# Patient Record
Sex: Female | Born: 1935 | Race: White | Hispanic: No | Smoking: Former smoker
Health system: Southern US, Community
[De-identification: ages and names within clinical notes are randomized; demographics above are authoritative.]

## PROBLEM LIST (undated history)

## (undated) DIAGNOSIS — R7303 Prediabetes: Secondary | ICD-10-CM

## (undated) DIAGNOSIS — K648 Other hemorrhoids: Secondary | ICD-10-CM

## (undated) DIAGNOSIS — E785 Hyperlipidemia, unspecified: Secondary | ICD-10-CM

## (undated) DIAGNOSIS — F419 Anxiety disorder, unspecified: Secondary | ICD-10-CM

## (undated) DIAGNOSIS — K5730 Diverticulosis of large intestine without perforation or abscess without bleeding: Secondary | ICD-10-CM

## (undated) DIAGNOSIS — M1990 Unspecified osteoarthritis, unspecified site: Secondary | ICD-10-CM

## (undated) DIAGNOSIS — I447 Left bundle-branch block, unspecified: Secondary | ICD-10-CM

## (undated) DIAGNOSIS — K760 Fatty (change of) liver, not elsewhere classified: Secondary | ICD-10-CM

## (undated) DIAGNOSIS — I5032 Chronic diastolic (congestive) heart failure: Secondary | ICD-10-CM

## (undated) DIAGNOSIS — J40 Bronchitis, not specified as acute or chronic: Secondary | ICD-10-CM

## (undated) DIAGNOSIS — R0789 Other chest pain: Secondary | ICD-10-CM

## (undated) DIAGNOSIS — M8580 Other specified disorders of bone density and structure, unspecified site: Secondary | ICD-10-CM

## (undated) DIAGNOSIS — I2510 Atherosclerotic heart disease of native coronary artery without angina pectoris: Secondary | ICD-10-CM

## (undated) DIAGNOSIS — I10 Essential (primary) hypertension: Secondary | ICD-10-CM

## (undated) HISTORY — DX: Atherosclerotic heart disease of native coronary artery without angina pectoris: I25.10

## (undated) HISTORY — DX: Fatty (change of) liver, not elsewhere classified: K76.0

## (undated) HISTORY — DX: Left bundle-branch block, unspecified: I44.7

## (undated) HISTORY — DX: Other chest pain: R07.89

## (undated) HISTORY — DX: Essential (primary) hypertension: I10

## (undated) HISTORY — PX: BREAST SURGERY: SHX581

## (undated) HISTORY — DX: Hyperlipidemia, unspecified: E78.5

## (undated) HISTORY — DX: Bronchitis, not specified as acute or chronic: J40

## (undated) HISTORY — PX: TONSILLECTOMY: SUR1361

## (undated) HISTORY — PX: COLONOSCOPY: SHX174

## (undated) HISTORY — DX: Unspecified osteoarthritis, unspecified site: M19.90

## (undated) HISTORY — PX: ABDOMINAL HYSTERECTOMY: SHX81

## (undated) HISTORY — DX: Other hemorrhoids: K64.8

## (undated) HISTORY — DX: Anxiety disorder, unspecified: F41.9

## (undated) HISTORY — DX: Other specified disorders of bone density and structure, unspecified site: M85.80

## (undated) HISTORY — DX: Prediabetes: R73.03

## (undated) HISTORY — DX: Diverticulosis of large intestine without perforation or abscess without bleeding: K57.30

---

## 1997-06-25 ENCOUNTER — Other Ambulatory Visit: Admission: RE | Admit: 1997-06-25 | Discharge: 1997-06-25 | Payer: Self-pay | Admitting: *Deleted

## 1999-01-12 ENCOUNTER — Encounter: Payer: Self-pay | Admitting: *Deleted

## 1999-01-12 ENCOUNTER — Encounter: Admission: RE | Admit: 1999-01-12 | Discharge: 1999-01-12 | Payer: Self-pay | Admitting: *Deleted

## 1999-09-20 ENCOUNTER — Ambulatory Visit (HOSPITAL_COMMUNITY): Admission: RE | Admit: 1999-09-20 | Discharge: 1999-09-20 | Payer: Self-pay | Admitting: *Deleted

## 2000-01-31 ENCOUNTER — Encounter: Payer: Self-pay | Admitting: Internal Medicine

## 2000-01-31 ENCOUNTER — Encounter: Admission: RE | Admit: 2000-01-31 | Discharge: 2000-01-31 | Payer: Self-pay | Admitting: Internal Medicine

## 2000-06-27 ENCOUNTER — Ambulatory Visit (HOSPITAL_COMMUNITY): Admission: RE | Admit: 2000-06-27 | Discharge: 2000-06-27 | Payer: Self-pay | Admitting: Neurosurgery

## 2001-01-31 ENCOUNTER — Encounter: Payer: Self-pay | Admitting: Internal Medicine

## 2001-01-31 ENCOUNTER — Encounter: Admission: RE | Admit: 2001-01-31 | Discharge: 2001-01-31 | Payer: Self-pay | Admitting: Internal Medicine

## 2001-05-16 ENCOUNTER — Ambulatory Visit (HOSPITAL_COMMUNITY): Admission: RE | Admit: 2001-05-16 | Discharge: 2001-05-16 | Payer: Self-pay | Admitting: Internal Medicine

## 2001-05-16 ENCOUNTER — Encounter: Payer: Self-pay | Admitting: Internal Medicine

## 2002-02-13 ENCOUNTER — Encounter: Admission: RE | Admit: 2002-02-13 | Discharge: 2002-02-13 | Payer: Self-pay | Admitting: Internal Medicine

## 2002-02-13 ENCOUNTER — Encounter: Payer: Self-pay | Admitting: Internal Medicine

## 2003-06-18 ENCOUNTER — Encounter: Admission: RE | Admit: 2003-06-18 | Discharge: 2003-06-18 | Payer: Self-pay | Admitting: Internal Medicine

## 2004-08-04 ENCOUNTER — Encounter: Admission: RE | Admit: 2004-08-04 | Discharge: 2004-08-04 | Payer: Self-pay | Admitting: Family Medicine

## 2005-08-20 ENCOUNTER — Encounter: Admission: RE | Admit: 2005-08-20 | Discharge: 2005-08-20 | Payer: Self-pay | Admitting: Family Medicine

## 2006-07-01 ENCOUNTER — Ambulatory Visit: Payer: Self-pay | Admitting: Internal Medicine

## 2006-10-11 ENCOUNTER — Encounter: Admission: RE | Admit: 2006-10-11 | Discharge: 2006-10-11 | Payer: Self-pay | Admitting: Family Medicine

## 2007-04-26 DIAGNOSIS — I10 Essential (primary) hypertension: Secondary | ICD-10-CM | POA: Insufficient documentation

## 2007-10-21 ENCOUNTER — Encounter: Admission: RE | Admit: 2007-10-21 | Discharge: 2007-10-21 | Payer: Self-pay | Admitting: Family Medicine

## 2007-12-03 DIAGNOSIS — K648 Other hemorrhoids: Secondary | ICD-10-CM | POA: Insufficient documentation

## 2007-12-03 DIAGNOSIS — K5730 Diverticulosis of large intestine without perforation or abscess without bleeding: Secondary | ICD-10-CM | POA: Insufficient documentation

## 2008-01-19 ENCOUNTER — Ambulatory Visit: Payer: Self-pay | Admitting: Internal Medicine

## 2008-03-31 ENCOUNTER — Encounter (INDEPENDENT_AMBULATORY_CARE_PROVIDER_SITE_OTHER): Payer: Self-pay | Admitting: *Deleted

## 2008-05-21 ENCOUNTER — Encounter: Payer: Self-pay | Admitting: Internal Medicine

## 2008-06-22 ENCOUNTER — Ambulatory Visit: Payer: Self-pay | Admitting: Internal Medicine

## 2008-06-30 ENCOUNTER — Inpatient Hospital Stay (HOSPITAL_COMMUNITY): Admission: EM | Admit: 2008-06-30 | Discharge: 2008-07-01 | Payer: Self-pay | Admitting: Emergency Medicine

## 2008-06-30 ENCOUNTER — Telehealth: Payer: Self-pay | Admitting: Internal Medicine

## 2008-07-16 ENCOUNTER — Encounter: Admission: RE | Admit: 2008-07-16 | Discharge: 2008-07-16 | Payer: Self-pay | Admitting: Family Medicine

## 2009-01-13 ENCOUNTER — Telehealth: Payer: Self-pay | Admitting: Internal Medicine

## 2009-03-09 ENCOUNTER — Ambulatory Visit: Payer: Self-pay | Admitting: Family Medicine

## 2010-01-22 ENCOUNTER — Encounter: Payer: Self-pay | Admitting: Family Medicine

## 2010-01-31 NOTE — Progress Notes (Signed)
Summary: Schedule Hospital Colonoscopy?  Phone Note Outgoing Call   Call placed by: Hortense Ramal CMA Duncan Dull),  January 13, 2009 4:02 PM Call placed to: Patient Summary of Call: Patient was originally scheduled to have a hospital colonoscopy with banding on 06/30/08. However, per hospital d/c summary, patient did not have the colonoscopy as scheduled due to symptoms of weakness and tingling. She was found to be dehydrated and have pneumonia at that time. She had the miralax prep before having the symptoms of weakness etc..Marland KitchenMarland KitchenBefore I schedule her for another colonoscopy, I wanted to get your thoughts on what I should do. Do I need to just give her a different prep, does she need 24 hour observation for prep in hospital or do you think all her symptoms were related to the pneumonia?  Initial call taken by: Hortense Ramal CMA Duncan Dull),  January 13, 2009 4:07 PM  Follow-up for Phone Call        She should not have a direct colon, since it has been 8 months, and 1 year since I have seen her. She needs to make a new appointment in the office. Follow-up by: Hart Carwin MD,  January 14, 2009 12:26 AM  Additional Follow-up for Phone Call Additional follow up Details #1::        Patient has scheduled an office appointment to discuss colonoscopy with Dr Juanda Chance on 03/04/09. Additional Follow-up by: Hortense Ramal CMA Duncan Dull),  January 18, 2009 2:15 PM

## 2010-04-09 LAB — BASIC METABOLIC PANEL
CO2: 25 mEq/L (ref 19–32)
Calcium: 9.1 mg/dL (ref 8.4–10.5)
Chloride: 107 mEq/L (ref 96–112)
GFR calc Af Amer: >60 mL/min (ref >60)

## 2010-04-09 LAB — CBC
MCHC: 34.6 g/dL (ref 30.0–36.0)
MCV: 88.8 fL (ref 78.0–100.0)
Platelets: 163 10*3/uL (ref 150–400)
WBC: 8.1 10*3/uL (ref 4.0–10.5)

## 2010-04-09 LAB — LEGIONELLA ANTIGEN, URINE

## 2010-04-10 LAB — URINALYSIS, ROUTINE W REFLEX MICROSCOPIC
Hgb urine dipstick: NEGATIVE
Nitrite: NEGATIVE
Protein, ur: NEGATIVE mg/dL
Specific Gravity, Urine: 1.017 (ref 1.005–1.030)
Urobilinogen, UA: 0.2 mg/dL (ref 0.0–1.0)
pH: 5.5 (ref 5.0–8.0)

## 2010-04-10 LAB — POCT I-STAT, CHEM 8
Glucose, Bld: 125 mg/dL — ABNORMAL HIGH (ref 70–99)
HCT: 42 % (ref 36.0–46.0)
Hemoglobin: 14.3 g/dL (ref 12.0–15.0)
Potassium: 3.7 mEq/L (ref 3.5–5.1)
Sodium: 136 mEq/L (ref 135–145)

## 2010-04-10 LAB — URINE CULTURE: Colony Count: >=100000

## 2010-04-10 LAB — COMPREHENSIVE METABOLIC PANEL
AST: 31 U/L (ref 0–37)
Albumin: 3.8 g/dL (ref 3.5–5.2)
CO2: 19 mEq/L (ref 19–32)
Chloride: 103 mEq/L (ref 96–112)
GFR calc Af Amer: >60 mL/min (ref >60)
Glucose, Bld: 132 mg/dL — ABNORMAL HIGH (ref 70–99)
Potassium: 3.6 mEq/L (ref 3.5–5.1)
Sodium: 132 mEq/L — ABNORMAL LOW (ref 135–145)

## 2010-04-10 LAB — CULTURE, BLOOD (ROUTINE X 2): Culture: NO GROWTH

## 2010-04-10 LAB — DIFFERENTIAL
Eosinophils Relative: 0 % (ref 0–5)
Lymphocytes Relative: 7 % — ABNORMAL LOW (ref 12–46)
Monocytes Absolute: 0.5 10*3/uL (ref 0.1–1.0)
Neutro Abs: 11.1 10*3/uL — ABNORMAL HIGH (ref 1.7–7.7)
Neutrophils Relative %: 88 % — ABNORMAL HIGH (ref 43–77)

## 2010-04-10 LAB — PROTIME-INR: INR: 1 (ref 0.00–1.49)

## 2010-04-10 LAB — CBC
MCHC: 33.8 g/dL (ref 30.0–36.0)
RBC: 4.59 MIL/uL (ref 3.87–5.11)
RDW: 13.7 % (ref 11.5–15.5)

## 2010-04-10 LAB — MAGNESIUM: Magnesium: 2 mg/dL (ref 1.5–2.5)

## 2010-04-10 LAB — POCT CARDIAC MARKERS

## 2010-05-16 NOTE — Discharge Summary (Signed)
NAMEWALLIS, SPIZZIRRI                ACCOUNT NO.:  [id number]   MEDICAL RECORD NO.:  [id number]          PATIENT TYPE:  INP   LOCATION:  3702                         FACILITY:  [facility]   PHYSICIAN:  Charlestine Massed, MDDATE OF BIRTH:  [birth year]   DATE OF ADMISSION:  06/30/2008  DATE OF DISCHARGE:  07/01/2008                               DISCHARGE SUMMARY   PRIMARY CARE PHYSICIAN:  Burnell Blanks, MD at [address] [place], [place], [address].  Phone number [phone].   GASTROENTEROLOGIST:  Hedwig Morton. Juanda Chance, MD of [facility].   REASON FOR ADMISSION:  Generalized weakness and tingling sensation.   DISCHARGE DIAGNOSES:  1. Generalized weakness secondary to dehydration following colonoscopy      preparation.  2. Right upper, middle, and lower lobe pneumonia, possibly atypical.  3. History of hypertension, currently stable.   DISCHARGE MEDICATIONS:  1. Levofloxacin 750 mg p.o. daily for 7 days.  2. Bisoprolol and hydrochlorothiazide combination of 2.5/6.25 one      tablet daily.  3. Fenofibrate 145 mg p.o. daily.  4. Valium 10 mg p.o. at bedtime, which she was taking before.   HOSPITAL COURSE:  Ms. Francheska Villeda is an 77 year old pleasant female who  came to the emergency room because she was feeling extremely weak and  tired when she was undergoing preparation for colonoscopy, and she was  having bowel movements for once every 15-20 minutes.  She was also  getting some cramps in her lower extremities and some tingling  sensations in both lower extremities.   The patient was evaluated.  She did not have any neurological deficits  otherwise.  She had a CT scan of the head without contrast, which was  negative for any intracranial processes, chronic right-sided change was  seen.   Chest x-ray showed bilateral patchy airspace infiltrates, more on the  right side suggestive of multifocal pneumonia versus atypical pneumonia.  The patient also had  slight elevation in the WBC count possibly due to  sepsis from pneumonia.  The patient was started on IV antibiotics,  Rocephin and Zithromax.  The patient's clinical condition improved very  well on IV fluids and IV antibiotics.  Urinalysis was negative for any  bacteria or pyuria, so the urine culture, which showed evidence of  growth is nonsignificant because of normal urinalysis.   The patient improved considerably on IV antibiotics.  She is back to a  normal level of functioning at this time.  Lab work showed no evidence  of any further dehydration.  The patient is being discharged.  She will  get one dose of Pneumovax before discharge.  She has been advised to  follow up with her primary doctor in another 1-2 weeks to get a repeat  chest x-ray for seeing any resolution of the shadows that are present  and any further changes in management to be done at the discretion of  her PMD.  The patient is being discharged.   DISPOSITION:  Discharged back home.   FOLLOWUP:  Follow up with primary doctor, Dr. Nathanial Rancher, in 1-2 weeks.   A total  of 40 minutes spent on the discharge today.      Charlestine Massed, MD  Electronically Signed     UT/MEDQ  D:  07/01/2008  T:  07/02/2008  Job:  [id number]   cc:   Burnell Blanks, MD  Hedwig Morton. Juanda Chance, MD

## 2010-05-16 NOTE — Assessment & Plan Note (Signed)
[facility]                         GASTROENTEROLOGY OFFICE NOTE   Wanda Davenport, Wanda Davenport                       MRN:          [id number]  DATE:07/01/2006                            DOB:          July 21, 1923    Wanda Davenport is a very nice 77 year old white female who is here today to  discuss colorectal screening. Her sister died of colon cancer at the age  of 24 and her two brothers had colon polyps. We did a colonoscopy on her  in May 2003, with findings of moderate to severe diverticulosis of the  left colon as well as internal hemorrhoids. There was a question of anal  fissure. She is here for colonoscopy now, but came to discuss the  colonoscopy.   CURRENT MEDICATIONS:  1. Alprazolam 0.25 mg b.i.d.  2. Calcium supplements.  3. Aspirin 81 mg p.o. daily.  4. Vytorin 10/20 mg p.o. daily.  5. __________   PHYSICAL EXAMINATION:  Blood pressure 152/80, pulse 60, weight 184  pounds. She was overweight, alert and oriented. She had Dupuytren  contractions.  LUNGS:  Clear to auscultation.  COR: With normal S1, normal S2.  ABDOMEN: Was protuberant, obese, with surgical scar. No particular  tenderness. Liver edge at costal margin.  RECTAL: With normal rectal tones. Stool was hemoccult negative.   IMPRESSION:  An 77 year old white female with family history of colon  cancer as well as colon polyps in three direct relatives. She is due for  colonoscopy.   PLAN:  Colonoscopy scheduled for the fall in 2008. The patient will  receive a recall letter prior to that. She was unable to schedule for  this summer. She also needs to find out whether her insurance is going  to cover and how much would be the deductible.     Hedwig Morton. Juanda Chance, MD  Electronically Signed    DMB/MedQ  DD: 07/01/2006  DT: 07/01/2006  Job #: [id number]   cc:   Aida Puffer

## 2010-05-16 NOTE — H&P (Signed)
NAME:  Wanda Davenport, Wanda Davenport NO.:  [id number]   MEDICAL RECORD NO.:  [id number]          PATIENT TYPE:  INP   LOCATION:  1823                         FACILITY:  [facility]   PHYSICIAN:  Jude Ojie, MD          DATE OF BIRTH:  12-Feb-1923   DATE OF ADMISSION:  06/30/2008  DATE OF DISCHARGE:                              HISTORY & PHYSICAL   CHIEF COMPLAINT:  The patient woke up this morning while preparing for  her colonoscopy developed generalized weakness, tingling in the leg, and  shortness of breath.   HISTORY OF PRESENTING COMPLAINT:  The patient is an 77 year old with a  history of hypertension who was preparing for her routine colonoscopy  scheduled for this morning, woke up in the middle of the night, feeling  weak, had some tingling sensation in her lower extremities, and also  noted to have some shortness of breath and subsequently transferred to  the ED for further evaluation.  The patient denied any associated chest  pain.  Was evaluated in the ED with cardiac enzymes negative, but EKG  showed some left bundle-branch block, but no old EKG to compare with.  Her ED physician discussed with the cardiologist on-call who reviewed  the EKG and felt that there was no indication for any cardiac  intervention at this time, and no need for any Cardiology consult.  We  will get old records including old EKG from her primary care physician  in the morning.  Chest x-ray done also showed bilateral airspace disease  suggestive of multifocal pneumonia versus atypical edema.  The patient  is being admitted for further evaluation and treatment for bilateral  atypical pneumonia.  The patient denied any recent cough.  Had some  chills this morning with subjective fever at home.  Had some nausea and  vomiting, but no diarrhea.  Denied any focal deficit or any aphasia.   PAST MEDICAL HISTORY:  Significant for hypertension.   ALLERGIES:  The patient is allergic to SULFONAMIDE.  Claims  this makes  her sick.   OUTPATIENT MEDICATION:  1. Bisoprolol/hydrochlorothiazide 2.5/6.25 mg p.o. daily.  2. Fenofibrate 160 mg p.o. daily.  3. Valium 10 mg p.o. daily as needed.   FAMILY HISTORY:  The patient lives alone at home, has two children.  Family history is significant for hypertension in the sister and the  brother.   SOCIAL HISTORY:  The patient quit smoking more than 30 years ago.  Prior  to that, only smoked occasionally.  Denied any history of alcohol or IV  drug use.   REVIEW OF SYSTEMS:  GENERAL:  Positive for some chills with subjective  fevers at home.  No loss of appetite.  RESPIRATORY:  Positive for mild shortness of breath, but no cough.  CVS:  Denies any chest pain or palpitations.  GI:  Positive for nausea and vomiting, but no diarrhea or constipation.  ENDOCRINE:  No polyuria, polydipsia, or polyphagia.  GENITOURINARY:  No dysuria, frequency, or urgency.  HEME:  No easy skin bruising or epistaxis.  PSYCH:  No depression.  MUSCULOSKELETAL:  No joint pain or muscle aches.  NEURO:  No headaches, seizures, or syncope.   PHYSICAL EXAMINATION:  VITAL SIGNS:  On presentation, blood pressure of  183/72, pulse rate of 99, respiratory rate of 24, temperature of 99.5,  and O2 sat of 94%.  GENERAL:  On exam, found an elderly Caucasian lady, currently not in any  acute distress.  HEENT:  Pupils equal, round, and reactive to light and accommodation  bilaterally.  Extraocular muscles intact.  NECK:  Supple.  No JVD.  No thyroid enlargement.  RESPIRATORY:  Bibasilar rhonchi in the lung bases.  CVS:  Heart sound S1 and S2.  Regular rate and rhythm.  No murmurs,  rubs, or gallop.  ABDOMEN:  Flat, nontender, and organomegaly.  Bowel sound positive in  all quadrants.  EXTREMITIES:  No cyanosis, clubbing, or edema.  Pedal pulses palpable  bilaterally.  NEURO:  The patient is awake, alert, and oriented to time, place, and  person.  No focal findings noted at this  time.   LABORATORY DATA:  CBC with a white count of 12.6, hemoglobin 13.9,  hematocrit 41.2, and platelet of 199.  BNP with sodium of 132, potassium  3.6, chloride 103, bicarb 19, BUN 18, creatinine 1.0, glucose of 132,  and calcium 9.3.  LFT essentially unremarkable.  Magnesium 2.0.  Troponin less than 0.05.  CK-MB 1.0.  Urinalysis negative for urinary  tract infection.  INR 1.0, PT 13.1.  EKG showed a left bundle-branch  block with normal sinus rhythm.  Chest x-ray showed bilateral patchy  airspace infiltrate suggestive of multifocal pneumonia versus atypical  edema.  Borderline cardiomegaly.  CT scan of the head has been ordered,  but the report is pending at this time.   ASSESSMENT:  1. Probable bilateral pneumonia.  2. Leukocytosis.  3. Mild hyponatremia.  4. Generalized weakness with tingling sensation in the lower      extremity, rule out possible cerebrovascular disease.   PLAN:  1. We will admit to telemetry bed.  2. We will follow up on the CT scan of the head to rule out any acute      intracranial process, which has been ordered already.  3. We will continue with IV antibiotics for treatment of probable      atypical pneumonia.  Continue with IV ceftriaxone and azithromycin.  4. We will check urinary legionella antigen given the probable ongoing      atypical pneumonia associated with hyponatremia.  5. We will also check a.m. labs.  6. We will get old records from her primary care physician including      old EKGs to compare with.  Should the patient have any chest pain      or bump in her cardiac enzymes during these admission, we will      request Cardiology consult for further evaluation.  As stated      above, cardiologist on-call today had reviewed the EKG and felt      that there was no indication for any Cardiology consultation at      this time.  7. Prophylaxis.  The patient is being placed on Lovenox and Protonix.     Case and plan was discussed extensively  with the patient and the  daughter at the bedside.  They voiced understanding and they are in  agreement with the plan of care.      Jude Enid Baas, MD  Electronically Signed     JO/MEDQ  D:  06/30/2008  T:  06/30/2008  Job:  [id number]

## 2010-05-19 NOTE — Procedures (Signed)
[facility]  Patient:    Wanda Davenport, Wanda Davenport Visit Number: [id number] MRN: [id number]          Service Type: END Location: ENDO Attending Physician:  Mervin Hack Dictated by:   Hedwig Morton. Juanda Chance, M.D. [facility] Admit Date:  05/16/2001                             Procedure Report  PROCEDURE:  Colonoscopy.  ENDOSCOPIST:  Hedwig Morton. Juanda Chance, M.D.  INDICATIONS:  This 77 year old white female has a strong family history of colon cancer in her sister who had colon cancer in her age of 4.  The patient herself has a history of symptomatic hemorrhoids and rectal pain which have bothered her most recently.  On a recent exam on April 09, 2001, she had first-grade hemorrhoids and question of a rectal fissure.  She is now undergoing colonoscopy for evaluation of the rectal pain as well as for neoplastic screening.  ENDOSCOPE:  Olympus single-channel video endoscope.  SEDATION:  Versed 7.5 mg IV, Demerol 60 mg IV.  FINDINGS:  Olympus single-channel video endoscope was under direct vision in through the rectum to the sigmoid colon.  The patient was monitored by a pulse oximeter; her oxygen saturations were satisfactory.  Her prep was excellent. Retroflexion of endoscope in the rectum showed large first-grade hemorrhoids; at least three hemorrhoids were noted in the rectal ampulla.  None of prolapsed and they did not show any stigmata of active bleeding.  I could not appreciate any fissure in the anal canal.  The sigmoid colon was quite tortuous.  Lumen was narrowed to about 50% diameter with numerous diverticula and it was difficult to negotiate through the sigmoid colon.  The descending colon, splenic flexure and transverse colon were unremarkable.  No diverticula were noted in the right colon or the cecum.  Cecal pouch and ileocecal valve were normal.  Colonoscope was then retracted and colon decompressed.  The patient tolerated procedure well.  IMPRESSION: 1.  First-grade internal hemorrhoids. 2. Moderately severe diverticulosis of the left colon with mild narrowing of    the left colon.  PLAN: 1. Treat diverticulosis with a high-fiber diet, fiber supplements and possible    antispasmodics should she developed abdominal pain. 2. Anusol-HC suppositories on a daily basis.  If the patient continues to have    rectal pain, I would recommend surgical evaluation of the hemorrhoids. Dictated by:   Hedwig Morton. Juanda Chance, M.D. [facility] Attending Physician:  Mervin Hack DD:  05/16/01 TD:  05/17/01 Job: [id number]

## 2010-06-16 ENCOUNTER — Observation Stay (HOSPITAL_COMMUNITY)
Admission: EM | Admit: 2010-06-16 | Discharge: 2010-06-17 | DRG: 195 | Disposition: A | Discharge status: Home / Self Care | Payer: Medicare Other | Source: Ambulatory Visit | Attending: Internal Medicine | Admitting: Internal Medicine

## 2010-06-16 ENCOUNTER — Emergency Department (HOSPITAL_COMMUNITY): Payer: Medicare Other

## 2010-06-16 DIAGNOSIS — Z7982 Long term (current) use of aspirin: Secondary | ICD-10-CM

## 2010-06-16 DIAGNOSIS — E785 Hyperlipidemia, unspecified: Secondary | ICD-10-CM | POA: Diagnosis present

## 2010-06-16 DIAGNOSIS — I517 Cardiomegaly: Secondary | ICD-10-CM

## 2010-06-16 DIAGNOSIS — I10 Essential (primary) hypertension: Secondary | ICD-10-CM | POA: Diagnosis present

## 2010-06-16 DIAGNOSIS — E860 Dehydration: Secondary | ICD-10-CM | POA: Diagnosis present

## 2010-06-16 DIAGNOSIS — D72829 Elevated white blood cell count, unspecified: Secondary | ICD-10-CM | POA: Diagnosis present

## 2010-06-16 DIAGNOSIS — J189 Pneumonia, unspecified organism: Principal | ICD-10-CM | POA: Diagnosis present

## 2010-06-16 DIAGNOSIS — Z87891 Personal history of nicotine dependence: Secondary | ICD-10-CM

## 2010-06-16 LAB — DIFFERENTIAL
Basophils Absolute: 0 10*3/uL (ref 0.0–0.1)
Basophils Relative: 0 % (ref 0–1)
Eosinophils Relative: 0 % (ref 0–5)
Lymphs Abs: 1.1 10*3/uL (ref 0.7–4.0)

## 2010-06-16 LAB — URINALYSIS, ROUTINE W REFLEX MICROSCOPIC
Bilirubin Urine: NEGATIVE
Hgb urine dipstick: NEGATIVE
Ketones, ur: NEGATIVE mg/dL
Nitrite: NEGATIVE
Protein, ur: NEGATIVE mg/dL

## 2010-06-16 LAB — CK TOTAL AND CKMB (NOT AT ARMC)
CK, MB: 1.7 ng/mL (ref 0.3–4.0)
Relative Index: INVALID (ref 0.0–2.5)
Total CK: 32 U/L (ref 7–177)

## 2010-06-16 LAB — CARDIAC PANEL(CRET KIN+CKTOT+MB+TROPI)
Relative Index: INVALID (ref 0.0–2.5)
Total CK: 28 U/L (ref 7–177)
Troponin I: <0.3 ng/mL (ref <0.30)

## 2010-06-16 LAB — BASIC METABOLIC PANEL
BUN: 20 mg/dL (ref 6–23)
Creatinine, Ser: 0.77 mg/dL (ref 0.50–1.10)
Glucose, Bld: 117 mg/dL — ABNORMAL HIGH (ref 70–99)
Sodium: 137 mEq/L (ref 135–145)

## 2010-06-16 LAB — CBC
HCT: 40.7 % (ref 36.0–46.0)
Hemoglobin: 13.9 g/dL (ref 12.0–15.0)
MCHC: 34.2 g/dL (ref 30.0–36.0)
MCV: 86.8 fL (ref 78.0–100.0)
Platelets: 165 10*3/uL (ref 150–400)
RBC: 4.69 MIL/uL (ref 3.87–5.11)
RDW: 14 % (ref 11.5–15.5)

## 2010-06-16 LAB — HEPATIC FUNCTION PANEL
AST: 19 U/L (ref 0–37)
Bilirubin, Direct: <0.1 mg/dL (ref 0.0–0.3)
Total Bilirubin: 0.4 mg/dL (ref 0.3–1.2)

## 2010-06-16 LAB — TSH: TSH: 1.04 u[IU]/mL (ref 0.350–4.500)

## 2010-06-17 ENCOUNTER — Inpatient Hospital Stay (HOSPITAL_COMMUNITY): Payer: Medicare Other

## 2010-06-17 LAB — DIFFERENTIAL
Basophils Absolute: 0 10*3/uL (ref 0.0–0.1)
Eosinophils Absolute: 0.3 10*3/uL (ref 0.0–0.7)
Eosinophils Relative: 2 % (ref 0–5)
Lymphocytes Relative: 27 % (ref 12–46)
Neutrophils Relative %: 63 % (ref 43–77)

## 2010-06-17 LAB — CBC
HCT: 38 % (ref 36.0–46.0)
Platelets: 174 10*3/uL (ref 150–400)
RDW: 14.4 % (ref 11.5–15.5)
WBC: 11.9 10*3/uL — ABNORMAL HIGH (ref 4.0–10.5)

## 2010-06-17 LAB — COMPREHENSIVE METABOLIC PANEL
AST: 15 U/L (ref 0–37)
Albumin: 2.9 g/dL — ABNORMAL LOW (ref 3.5–5.2)
Alkaline Phosphatase: 63 U/L (ref 39–117)
Chloride: 106 mEq/L (ref 96–112)
Potassium: 4.1 mEq/L (ref 3.5–5.1)
Total Bilirubin: 0.3 mg/dL (ref 0.3–1.2)

## 2010-06-26 NOTE — H&P (Signed)
NAMERION, SCHNITZER NO.:  [id number]  MEDICAL RECORD NO.:  [id number]  LOCATION:  [facility]                         FACILITY:  [facility]  PHYSICIAN:  Eduard Clos, MDDATE OF BIRTH:  03-15-1923  DATE OF ADMISSION:  06/16/2010 DATE OF DISCHARGE:                             HISTORY & PHYSICAL   PRIMARY CARE PHYSICIAN:  Burnell Blanks, [facility], [facility].  CHIEF COMPLAINT:  Nausea, vomiting, and chills.  HISTORY OF PRESENT ILLNESS:  This is an 77 year old female with history of hypertension, hyperlipidemia, and allergy rhinitis, has been doing fine and over the last few days she had been having running nose and also has some indigestion-like feeling with some chest discomfort which lasted few minutes over the last few weeks.  Last time, she suddenly woke up from night, had at least 3 episodes of nausea and vomiting after which she felt having fever and chills.  She came to the ER.  In the ER, the patient was found to have leukocytosis and chest x-ray compatible with pneumonia.  The patient has been admitted for further workup.  At this time, the patient does not have any chest pain.  Denies any nausea at this time.  Feels like eating something.  Denies any headache or visual symptoms.  Denies any abdominal pain, though she states that off and on she does get some abdominal discomfort in the right upper quadrant.  Denies any dysuria, discharge, or diarrhea.  Denies any focal deficit or headache.  PAST MEDICAL HISTORY: 1. Hypertension. 2. Hyperlipidemia. 3. Allergic rhinitis.  PAST SURGICAL HISTORY:  Hysterectomy.  MEDICATIONS ON ADMISSION: 1. Bisoprolol and hydrochlorothiazide 5/6.25 mg p.o. daily. 2. Colestipol 1 g p.o. twice daily. 3. Loratadine 10 mg p.o. daily. 4. Aspirin 325 p.o. daily. 5. Calcium carbonate. 6. Fluticasone nasal spray.  ALLERGIES:  SULFA.  FAMILY HISTORY:  Nothing contributory.  SOCIAL HISTORY:  The patient denies smoking  cigarettes, quit more than 4 years ago.  Denies any alcohol or drug abuse.  REVIEW OF SYSTEMS:  As per history of present illness, nothing else significant.  PHYSICAL EXAMINATION:  GENERAL:  The patient is examined at bedside, not in acute distress. VITAL SIGNS:  Blood pressure is 136/60, pulse is 90 per minute, temperature 98.2, respirations 20 per minute, and O2 sat 95%. HEENT:  Anicteric.  No pallor.  No discharge from ears, eyes, nose, or mouth. CHEST:  Bilateral air entry present.  No rhonchi.  No crepitation. HEART:  S1 and S2 heard. ABDOMEN:  Soft and nontender.  Bowel sounds heard. CENTRAL NERVOUS SYSTEM:  The patient is alert, awake, and oriented to time, place, and person.  Moves upper and lower extremities 5/5. EXTREMITIES:  Peripheral pulses felt.  No edema.  LABORATORY DATA:  EKG shows sinus rhythm with heart rate around 91 beats per minute with LBBB.  The patient's old EKG on June 02, 2008, also showed LBBB.  Chest x-ray shows small focus of right lower lobe pneumonia.  CBC:  WBC is 19, hemoglobin is 13.9, hematocrit is 40.7, platelets 165, and neutrophils 89%.  Basic metabolic panel:  Sodium 137, potassium 3.8, chloride 105, dioxide 20, anion gap is 12, glucose 117, BUN  20, creatinine 0.7, calcium 9.5.  UA is negative for nitrite and leukocytes.  ASSESSMENT: 1. Pneumonia. 2. Chest discomfort. 3. Nausea and vomiting. 4. History of hypertension. 5. History of hyperlipidemia. 6. Dehydration. 7. History of allergic rhinitis.  PLAN: 1. At this time, we will admit the patient to telemetry. 2. For her pneumonia which we will treat as community acquired, the     patient is already started on ceftriaxone and Zithromax which we     will continue. 3. For her chest discomfort, the patient does not have any chest     discomfort at this time.  I am going to cycle cardiac markers.  We     will get a 2-D echo. 4. Nausea and vomiting, which also could be causing her chest      discomfort and feeling of indigestion.  I am going to check LFTs     and lipase and get a sonogram of the abdomen. 5. Further recommendation based on test ordered and clinical course.     Eduard Clos, MD     ANK/MEDQ  D:  06/16/2010  T:  06/16/2010  Job:  [id number]  Electronically Signed by Midge Minium MD on 06/26/2010 07:34:39 AM

## 2010-07-13 NOTE — Discharge Summary (Signed)
  [name], [name] NO.:  [id number]  MEDICAL RECORD NO.:  [id number]  LOCATION:  4705                         FACILITY:  [facility]  PHYSICIAN:  Jonny Ruiz, MD    DATE OF BIRTH:  1923-11-11  DATE OF ADMISSION:  06/16/2010 DATE OF DISCHARGE:  06/17/2010                              DISCHARGE SUMMARY   REASON FOR HOSPITALIZATION:  Nausea, vomiting and chills.  DISCHARGE DIAGNOSIS:  Right lower lobe pneumonia, uncomplicated.  SECONDARY DIAGNOSES:  Hypertension, hyperlipidemia, allergic rhinitis.  DISCHARGE MEDICATIONS: 1. Bisoprolol/hydrochlorothiazide 5/6.25 p.o. daily. 2. Colestipol 1 g p.o. b.i.d. 3. Loratadine 10 mg p.o. daily. 4. Aspirin 325 mg p.o. daily. 5. Calcium carbonate. 6. Fluticasone nasal two sprays per nostril daily.  DIAGNOSTIC FINDINGS:  Chest x-ray small focus of right lower lobe pneumonia which upon repeat today was read as prior infiltrates of the right lung base cleared.  Abdominal ultrasound, hepatitic steatosis.  No evidence of cholelithiasis.  Echocardiogram, left ventricle:  Cavity size was normal.  Mild focal basal hypertrophy of the septum.  Systolic function normal.  EF in the range of 60-65%.  Wall motion was normal. No regional wall motion abnormalities.  Doppler parameters consistent with abnormal left ventricular relaxation (grade 1 diastolic dysfunction), pulmonary arteries; PA peak pressure 37 mmHg.  LABORATORY RESULTS:  Magnesium 2.2, sodium 138, potassium 4.1, chloride 106, CO2 24, glucose 99, BUN 22, creatinine 0.71, bilirubin total 0.3, alka phos 63, AST 15, ALT 13, total protein 5.9, albumin 2.9, calcium 9.0.  White count on admission showed [place] of 81[address] with a left shift, hemoglobin 13.9, hematocrit 40.7, platelet count 165.  At discharge, her white count came down to 11.9.  Her TSH was 1.04.  Cardiac enzymes negative including troponin.  Lipase 28.  Urinalysis negative.  HOSPITAL COURSE:  Ms. Wanda Davenport is an  77 year old female who was brought to the hospital for nausea, vomiting, and chills over the last few days prior to admission.  In addition, she was having runny nose and some indigestion-like feeling with chest discomfort.  In the emergency department, she was found to have leukocytosis and a chest x-ray compatible with right lower lobe pneumonia.  She was admitted to the hospital.  She was treated with Rocephin and Zithromax, and she felt better over the next 24 hours.  She has no events on telemetry monitor.  These studies had stated above.  She will be switched to Avelox 400 mg p.o. daily for 7 days and follow up with primary care physician in 2 weeks.  Condition upon discharge improved. Follow up in 2 weeks with primary care physician.          ______________________________ Jonny Ruiz, MD     GL/MEDQ  D:  06/17/2010  T:  06/17/2010  Job:  [id number]  Electronically Signed by Jonny Ruiz MD on 07/13/2010 04:42:55 PM

## 2010-09-26 ENCOUNTER — Ambulatory Visit
Admission: RE | Admit: 2010-09-26 | Discharge: 2010-09-26 | Disposition: A | Discharge status: Home / Self Care | Payer: Medicare Other | Source: Ambulatory Visit | Attending: Family Medicine | Admitting: Family Medicine

## 2010-09-26 ENCOUNTER — Other Ambulatory Visit: Payer: Self-pay | Admitting: Family Medicine

## 2010-09-26 DIAGNOSIS — R0602 Shortness of breath: Secondary | ICD-10-CM

## 2010-12-05 ENCOUNTER — Ambulatory Visit: Payer: Self-pay | Admitting: Family Medicine

## 2011-02-08 ENCOUNTER — Ambulatory Visit: Payer: Self-pay | Admitting: Physician Assistant

## 2011-04-13 ENCOUNTER — Encounter: Payer: Self-pay | Admitting: Internal Medicine

## 2011-04-16 ENCOUNTER — Encounter: Payer: Self-pay | Admitting: Internal Medicine

## 2011-04-16 ENCOUNTER — Encounter: Payer: Self-pay | Admitting: *Deleted

## 2011-04-16 ENCOUNTER — Ambulatory Visit (INDEPENDENT_AMBULATORY_CARE_PROVIDER_SITE_OTHER): Payer: Medicare Other | Admitting: Internal Medicine

## 2011-04-16 DIAGNOSIS — R7303 Prediabetes: Secondary | ICD-10-CM | POA: Insufficient documentation

## 2011-04-16 DIAGNOSIS — M159 Polyosteoarthritis, unspecified: Secondary | ICD-10-CM | POA: Insufficient documentation

## 2011-04-16 DIAGNOSIS — I509 Heart failure, unspecified: Secondary | ICD-10-CM

## 2011-04-16 DIAGNOSIS — I447 Left bundle-branch block, unspecified: Secondary | ICD-10-CM | POA: Insufficient documentation

## 2011-04-16 DIAGNOSIS — R079 Chest pain, unspecified: Secondary | ICD-10-CM

## 2011-04-16 DIAGNOSIS — I5032 Chronic diastolic (congestive) heart failure: Secondary | ICD-10-CM

## 2011-04-16 DIAGNOSIS — J309 Allergic rhinitis, unspecified: Secondary | ICD-10-CM | POA: Insufficient documentation

## 2011-04-16 DIAGNOSIS — E785 Hyperlipidemia, unspecified: Secondary | ICD-10-CM

## 2011-04-16 DIAGNOSIS — I10 Essential (primary) hypertension: Secondary | ICD-10-CM

## 2011-04-16 DIAGNOSIS — I110 Hypertensive heart disease with heart failure: Secondary | ICD-10-CM

## 2011-04-16 DIAGNOSIS — R0789 Other chest pain: Secondary | ICD-10-CM | POA: Insufficient documentation

## 2011-04-16 DIAGNOSIS — R609 Edema, unspecified: Secondary | ICD-10-CM

## 2011-04-16 MED ORDER — BISOPROLOL-HYDROCHLOROTHIAZIDE 5-6.25 MG PO TABS: 2.0000 | ORAL_TABLET | Freq: Every day | ORAL | Status: DC

## 2011-04-16 NOTE — Patient Instructions (Signed)
Your physician recommends that you schedule a follow-up appointment in: PENDING TEST RESULTS Your physician has recommended you make the following change in your medication: START PRILOSEC OTC 20 MG 1 EVERY DAY AND TAKE  2 OF THE BISOPROLOL HCTZ 5/6.25 MG  Your physician has requested that you have a lexiscan myoview. For further information please visit [email or web address] Please follow instruction sheet, as given. DX CHEST PAIN

## 2011-04-16 NOTE — Progress Notes (Signed)
Patient Name: Wanda Davenport Date of Encounter: 04/16/2011  Primary Care Provider:  Ailene Ravel, MD, MD Primary Cardiologist:  Odessa Fleming, MD  Patient Profile  78 y/o female w/o prior cardiac history who presents for evaluation of chest pain.  Problem List   Past Medical History  Diagnosis Date  . HYPERTENSION   . HEMORRHOIDS, INTERNAL   . DIVERTICULOSIS, COLON   . Hyperlipidemia   . Pre-diabetes   . Osteopenia   . Anxiety   . LBBB (left bundle branch block)     a. present on ECG 06/2010  . Arthritis   . Allergic rhinitis   . Atypical chest pain    Past Surgical History  Procedure Date  . Breast surgery   . Tonsillectomy   . Abdominal hysterectomy   . Colonoscopy     a. 2010    Allergies  Allergies  Allergen Reactions  . Fenofibrate     Myalgias/gi upset   . Statins     Myalgias   . Sulfonamide Derivatives     REACTION: nausea    HPI  78 y/o female with the above problem list.  Since last summer, she has been having a constant mild, dull, substernal chest discomfort that is not worse with activity/exertion or eating.  Ss are somewhat improved by Tums but as best she can tell, they never go completely away.  She also occasionally has a heaviness that occurs along the medial aspect of her left breast.  Though this is a little more likely to occur with exertion, it too may occur @ anytime.  This Ss usually lasts 5-10 mins and resolves spontaneously.  It is not necessarily associated with dyspnea, though she does experience DOE with higher levels of activity.  Finally, over the past month, she has noted intermittent dull, 1/10, discomfort originating in her LUQ and moves up across her left breast.  This typically occurs at rest, lasts 5-10 mins and resolves spont.  She recently saw her PCP and mentioned her Ss.  An ecg was performed and there was concern for a new LBBB.  As a result, this appt was made.  Upon further review, pt had an ECG last June that also  shows LBBB.  Home Medications  Prior to Admission medications   Medication Sig Start Date End Date Taking? Authorizing Provider  acetaminophen (TYLENOL) 500 MG tablet Take 500 mg by mouth every 6 (six) hours as needed.   Yes Historical Provider, MD  ALPRAZolam Prudy Feeler) 0.25 MG tablet Take 0.25 mg by mouth at bedtime as needed.   Yes Historical Provider, MD  aspirin 81 MG tablet Take 81 mg by mouth daily.   Yes Historical Provider, MD  bisoprolol-hydrochlorothiazide (ZIAC) 5-6.25 MG per tablet Take 1 tablet by mouth daily.   Yes Historical Provider, MD  cholecalciferol (VITAMIN D) 1000 UNITS tablet Take 1,000 Units by mouth daily.   Yes Historical Provider, MD  ezetimibe (ZETIA) 10 MG tablet Take 10 mg by mouth daily.   Yes Historical Provider, MD  guaiFENesin (MUCINEX) 600 MG 12 hr tablet Take 1,200 mg by mouth as needed.   Yes Historical Provider, MD  Ibuprofen (ADVIL) 200 MG CAPS Take by mouth as needed.   Yes Historical Provider, MD   Family History  Family History  Problem Relation Age of Onset  . Colon cancer Sister   . Melanoma Sister   . Hyperlipidemia Brother   . Hypertension Mother   . Alzheimer's disease Brother   . Kidney  disease Father     died 20  . Hypertension Father   . Lupus Mother     died 51 from surgical complications   Social History  History   Social History  . Marital Status: Widowed    Spouse Name: N/A    Number of Children: N/A  . Years of Education: N/A   Occupational History  . Not on file.   Social History Main Topics  . Smoking status: Former Smoker -- 20 years    Types: Cigarettes  . Smokeless tobacco: Not on file   Comment: smoked a few cigarettes/day x 20 yrs - quit @ age 4.  Marland Kitchen Alcohol Use: No  . Drug Use: No  . Sexually Active: Not Currently   Other Topics Concern  . Not on file   Social History Narrative   Lives in [place] by herself.  She has family near-by.  She tries to stay active around the house - does all of her own  housework and still mows (rides) her yard.     Review of Systems General:  No chills, fever, night sweats or weight changes.  Cardiovascular:  Chest pain and dyspnea on exertion as outlined in the HPI.  No edema, orthopnea, palpitations, paroxysmal nocturnal dyspnea. Dermatological: No rash, lesions/masses Respiratory: No cough, dyspnea Urologic: No hematuria, dysuria Abdominal:   No nausea, vomiting, diarrhea, bright red blood per rectum, melena, or hematemesis Neurologic:  No visual changes, wkns, changes in mental status. All other systems reviewed and are otherwise negative except as noted above.  Physical Exam  Blood pressure 160/75, pulse 76, height 5\' 4"  (1.626 m), weight 171 lb (77.565 kg).  General: Pleasant, NAD Psych: Normal affect. Neuro: Alert and oriented X 3. Moves all extremities spontaneously. HEENT: Normal  Neck: Supple without bruits or JVD. Lungs:  Resp regular and unlabored, CTA. Heart: RRR no s3, s4, or murmurs. Abdomen: Soft, non-tender, non-distended, BS + x 4.  Extremities: No clubbing, cyanosis or edema. DP/PT/Radials 2+ and equal bilaterally.  Accessory Clinical Findings  ECG - RSR, 72, 1st deg AVB, LAD, LBBB.  Assessment & Plan  1.  Atypical Chest Pain:  Pt has at least 3 different types of chest discomfort as outlined above.  She does have a LBBB however this is not new and dates back at least to June of 2012.  We will arrange for a lexiscan myoview to r/o ischemia.  Cont current medications.  Add PPI.  2.  HTN: BP elevated today but pt says this is unusual. Cont current meds and rec f/u w/ PCP.  3.  HL:  On zetia.  Intol to statins.   Nicolasa Ducking, NP 04/16/2011, 3:25 PM

## 2011-04-16 NOTE — Assessment & Plan Note (Addendum)
Present in 2012 but not 2008   Not clearly an issue  May reflect a cardiomyopathy esp as she has symptoms of worsening exercise intolerance

## 2011-04-16 NOTE — Assessment & Plan Note (Signed)
As above.

## 2011-04-16 NOTE — Assessment & Plan Note (Signed)
She has evidence of modest volume overload and hypertension.  We will see what her LV function but for now i will presume it is normal  With poorly controlled blood pressure and edema will increase her BB/HCT combination and see if this helps  If not might try lasix

## 2011-04-19 ENCOUNTER — Telehealth (HOSPITAL_COMMUNITY): Payer: Self-pay | Admitting: Radiology

## 2011-04-19 NOTE — Telephone Encounter (Signed)
Noted  

## 2011-04-24 ENCOUNTER — Other Ambulatory Visit (HOSPITAL_COMMUNITY): Payer: Medicare Other

## 2011-05-24 ENCOUNTER — Encounter: Payer: Self-pay | Admitting: *Deleted

## 2011-06-12 ENCOUNTER — Ambulatory Visit: Payer: Medicare Other | Admitting: Internal Medicine

## 2011-08-21 ENCOUNTER — Encounter: Payer: Self-pay | Admitting: Internal Medicine

## 2011-08-21 ENCOUNTER — Ambulatory Visit (INDEPENDENT_AMBULATORY_CARE_PROVIDER_SITE_OTHER): Payer: Medicare Other | Admitting: Internal Medicine

## 2011-08-21 VITALS — BP 140/72 | HR 80 | Ht 64.0 in | Wt 175.0 lb

## 2011-08-21 DIAGNOSIS — K648 Other hemorrhoids: Secondary | ICD-10-CM

## 2011-08-21 DIAGNOSIS — Z800 Family history of malignant neoplasm of digestive organs: Secondary | ICD-10-CM

## 2011-08-21 NOTE — Patient Instructions (Addendum)
Dr Maura Hamrick 

## 2011-08-21 NOTE — Progress Notes (Signed)
KHAYA THEISSEN 11-14-1923 MRN [id number]  History of Present Illness:  This is a 78 year old white female here to discuss colonoscopy. She has a family history of colon cancer in her sister and history of colon polyps in her brother. She has had numerous colonoscopies in the past starting in 1993, 1997 and 2003 with findings of a torturous sigmoid colon with diverticulosis and hemorrhoids. The last attempt for colonoscopy in June 2010 was unsuccessful since patient got dehydrated and was hospitalized for pneumonia. She has regular bowel habits. She has occasional symptomatic hemorrhoids for which she takes Preparation H.   Past Medical History  Diagnosis Date  . HYPERTENSION   . HEMORRHOIDS, INTERNAL   . DIVERTICULOSIS, COLON   . Hyperlipidemia   . Pre-diabetes   . Osteopenia   . Anxiety   . LBBB (left bundle branch block)     a. present on ECG 06/2010  . Arthritis   . Allergic rhinitis   . Atypical chest pain   . Hepatic steatosis    Past Surgical History  Procedure Date  . Breast surgery   . Tonsillectomy   . Abdominal hysterectomy   . Colonoscopy     a. 2010    reports that she has quit smoking. Her smoking use included Cigarettes. She quit after 20 years of use. She has never used smokeless tobacco. She reports that she does not drink alcohol or use illicit drugs. family history includes Alzheimer's disease in her brother; Colon cancer in her sister; Hyperlipidemia in her brother; Hypertension in her father and mother; Kidney disease in her father; Lupus in her mother; and Melanoma in her sister. Allergies  Allergen Reactions  . Fenofibrate     Myalgias/gi upset   . Statins     Myalgias   . Sulfonamide Derivatives     REACTION: nausea        Review of Systems: Denies abdominal pain rectal bleeding constipation  The remainder of the 10 point ROS is negative except as outlined in H&P   Physical Exam: General appearance  Well developed, in no distress. Eyes- non  icteric. HEENT nontraumatic, normocephalic. Mouth no lesions, tongue papillated, no cheilosis. Neck supple without adenopathy, thyroid not enlarged, no carotid bruits, no JVD. Lungs Clear to auscultation bilaterally. Cor normal S1, normal S2, regular rhythm, no murmur,  quiet precordium. Abdomen: Mildly protuberant but soft abdomen with normoactive bowel sounds. Well-healed surgical scar. No bruit.  Rectal: No perianal disease. Normal rectal sphincter tone. No tenderness or pain. Stool is soft Hemoccult negative. Extremities no pedal edema. Skin no lesions. Neurological alert and oriented x 3. Psychological normal mood and affect.  Assessment and Plan:  Problem #53 78 year old white female with a family history of colon polyps and colon cancer who is doing well having normal bowel habits. At the last attempt for colonoscopy in 2010, she became dehydrated and the procedure never materialized.. I feel that the recall colonoscopy is not indicated because of the risks exceeding the benefits. I have given her samples of recticare to take when necessary for rectal irritation. She will continue on a high-fiber diet. No further recall colonoscopy is planned unless she develops specific symptoms.   08/21/2011 Lina Sar

## 2011-09-23 ENCOUNTER — Encounter (HOSPITAL_COMMUNITY): Payer: Self-pay

## 2011-09-23 ENCOUNTER — Observation Stay (HOSPITAL_COMMUNITY)
Admission: EM | Admit: 2011-09-23 | Discharge: 2011-09-26 | Disposition: A | Discharge status: Home / Self Care | Payer: Medicare Other | Attending: Internal Medicine | Admitting: Internal Medicine

## 2011-09-23 ENCOUNTER — Emergency Department (HOSPITAL_COMMUNITY): Payer: Medicare Other

## 2011-09-23 DIAGNOSIS — R609 Edema, unspecified: Secondary | ICD-10-CM

## 2011-09-23 DIAGNOSIS — F419 Anxiety disorder, unspecified: Secondary | ICD-10-CM

## 2011-09-23 DIAGNOSIS — K5730 Diverticulosis of large intestine without perforation or abscess without bleeding: Secondary | ICD-10-CM | POA: Diagnosis present

## 2011-09-23 DIAGNOSIS — E876 Hypokalemia: Secondary | ICD-10-CM | POA: Insufficient documentation

## 2011-09-23 DIAGNOSIS — I447 Left bundle-branch block, unspecified: Secondary | ICD-10-CM | POA: Insufficient documentation

## 2011-09-23 DIAGNOSIS — R0602 Shortness of breath: Secondary | ICD-10-CM | POA: Insufficient documentation

## 2011-09-23 DIAGNOSIS — I10 Essential (primary) hypertension: Secondary | ICD-10-CM | POA: Insufficient documentation

## 2011-09-23 DIAGNOSIS — I509 Heart failure, unspecified: Secondary | ICD-10-CM | POA: Insufficient documentation

## 2011-09-23 DIAGNOSIS — I5043 Acute on chronic combined systolic (congestive) and diastolic (congestive) heart failure: Principal | ICD-10-CM | POA: Insufficient documentation

## 2011-09-23 DIAGNOSIS — R0789 Other chest pain: Secondary | ICD-10-CM | POA: Insufficient documentation

## 2011-09-23 DIAGNOSIS — I110 Hypertensive heart disease with heart failure: Secondary | ICD-10-CM

## 2011-09-23 DIAGNOSIS — E785 Hyperlipidemia, unspecified: Secondary | ICD-10-CM | POA: Insufficient documentation

## 2011-09-23 DIAGNOSIS — R079 Chest pain, unspecified: Secondary | ICD-10-CM

## 2011-09-23 DIAGNOSIS — I5032 Chronic diastolic (congestive) heart failure: Secondary | ICD-10-CM

## 2011-09-23 HISTORY — DX: Chronic diastolic (congestive) heart failure: I50.32

## 2011-09-23 LAB — CBC WITH DIFFERENTIAL/PLATELET
Eosinophils Absolute: 0 10*3/uL (ref 0.0–0.7)
Eosinophils Relative: 0 % (ref 0–5)
HCT: 39.2 % (ref 36.0–46.0)
Lymphocytes Relative: 8 % — ABNORMAL LOW (ref 12–46)
Lymphs Abs: 1.3 10*3/uL (ref 0.7–4.0)
MCH: 29.8 pg (ref 26.0–34.0)
MCV: 86 fL (ref 78.0–100.0)
Monocytes Absolute: 1 10*3/uL (ref 0.1–1.0)
Monocytes Relative: 6 % (ref 3–12)
Platelets: 161 10*3/uL (ref 150–400)
RBC: 4.56 MIL/uL (ref 3.87–5.11)
WBC: 16.7 10*3/uL — ABNORMAL HIGH (ref 4.0–10.5)

## 2011-09-23 LAB — BASIC METABOLIC PANEL
CO2: 24 mEq/L (ref 19–32)
Calcium: 10.1 mg/dL (ref 8.4–10.5)
Creatinine, Ser: 0.97 mg/dL (ref 0.50–1.10)
GFR calc non Af Amer: 51 mL/min — ABNORMAL LOW (ref >90)
Sodium: 137 mEq/L (ref 135–145)

## 2011-09-23 LAB — CK TOTAL AND CKMB (NOT AT ARMC)
CK, MB: 1.7 ng/mL (ref 0.3–4.0)
CK, MB: 1.5 ng/mL (ref 0.3–4.0)
Relative Index: INVALID (ref 0.0–2.5)
Relative Index: INVALID (ref 0.0–2.5)
Total CK: 26 U/L (ref 7–177)

## 2011-09-23 LAB — CBC
MCH: 30 pg (ref 26.0–34.0)
MCHC: 34.7 g/dL (ref 30.0–36.0)
MCV: 86.7 fL (ref 78.0–100.0)
Platelets: 183 10*3/uL (ref 150–400)
RDW: 13.4 % (ref 11.5–15.5)

## 2011-09-23 LAB — HEMOGLOBIN A1C: Mean Plasma Glucose: 126 mg/dL — ABNORMAL HIGH (ref <117)

## 2011-09-23 LAB — TSH: TSH: 1.088 u[IU]/mL (ref 0.350–4.500)

## 2011-09-23 MED ORDER — EZETIMIBE 10 MG PO TABS
10.0000 mg | ORAL_TABLET | Freq: Every day | ORAL | Status: DC
Start: 2011-09-23 — End: 2011-09-26
  Administered 2011-09-24 – 2011-09-26 (×3): 10 mg via ORAL
  Filled 2011-09-23 (×4): qty 1

## 2011-09-23 MED ORDER — ONDANSETRON HCL 4 MG/2ML IJ SOLN: 4.0000 mg | Freq: Four times a day (QID) | INTRAMUSCULAR | Status: DC | PRN

## 2011-09-23 MED ORDER — ALUM & MAG HYDROXIDE-SIMETH 200-200-20 MG/5ML PO SUSP: 30.0000 mL | Freq: Four times a day (QID) | ORAL | Status: DC | PRN

## 2011-09-23 MED ORDER — GI COCKTAIL ~~LOC~~
30.0000 mL | Freq: Once | ORAL | Status: AC
  Administered 2011-09-23: 30 mL via ORAL
  Filled 2011-09-23: qty 30

## 2011-09-23 MED ORDER — ONDANSETRON HCL 4 MG PO TABS: 4.0000 mg | ORAL_TABLET | Freq: Four times a day (QID) | ORAL | Status: DC | PRN

## 2011-09-23 MED ORDER — ASPIRIN EC 325 MG PO TBEC
325.0000 mg | DELAYED_RELEASE_TABLET | Freq: Every day | ORAL | Status: DC
  Administered 2011-09-24 – 2011-09-26 (×3): 325 mg via ORAL
  Filled 2011-09-23 (×3): qty 1

## 2011-09-23 MED ORDER — SODIUM CHLORIDE 0.9 % IJ SOLN
3.0000 mL | Freq: Two times a day (BID) | INTRAMUSCULAR | Status: DC
  Administered 2011-09-23 – 2011-09-25 (×4): 3 mL via INTRAVENOUS

## 2011-09-23 MED ORDER — ALPRAZOLAM 0.25 MG PO TABS: 0.2500 mg | ORAL_TABLET | Freq: Three times a day (TID) | ORAL | Status: DC | PRN

## 2011-09-23 MED ORDER — FLUTICASONE PROPIONATE 50 MCG/ACT NA SUSP
2.0000 | Freq: Every day | NASAL | Status: DC | PRN
  Filled 2011-09-23: qty 16

## 2011-09-23 MED ORDER — ENOXAPARIN SODIUM 40 MG/0.4ML ~~LOC~~ SOLN
40.0000 mg | SUBCUTANEOUS | Status: DC
  Administered 2011-09-23 – 2011-09-25 (×3): 40 mg via SUBCUTANEOUS
  Filled 2011-09-23 (×4): qty 0.4

## 2011-09-23 MED ORDER — PANTOPRAZOLE SODIUM 40 MG PO TBEC
40.0000 mg | DELAYED_RELEASE_TABLET | Freq: Every day | ORAL | Status: DC
  Administered 2011-09-23 – 2011-09-25 (×3): 40 mg via ORAL
  Filled 2011-09-23 (×3): qty 1

## 2011-09-23 MED ORDER — DOCUSATE SODIUM 100 MG PO CAPS: 100.0000 mg | ORAL_CAPSULE | Freq: Two times a day (BID) | ORAL | Status: DC | PRN

## 2011-09-23 MED ORDER — POLYVINYL ALCOHOL 1.4 % OP SOLN
1.0000 [drp] | Freq: Four times a day (QID) | OPHTHALMIC | Status: DC | PRN
  Administered 2011-09-26: 1 [drp] via OPHTHALMIC
  Filled 2011-09-23: qty 15

## 2011-09-23 MED ORDER — BISOPROLOL-HYDROCHLOROTHIAZIDE 5-6.25 MG PO TABS
2.0000 | ORAL_TABLET | Freq: Every day | ORAL | Status: DC
  Administered 2011-09-24 – 2011-09-26 (×3): 2 via ORAL
  Filled 2011-09-23 (×4): qty 2

## 2011-09-23 MED ORDER — ACETAMINOPHEN 500 MG PO TABS: 500.0000 mg | ORAL_TABLET | Freq: Four times a day (QID) | ORAL | Status: DC | PRN

## 2011-09-23 MED ORDER — NITROGLYCERIN 2 % TD OINT
1.0000 [in_us] | TOPICAL_OINTMENT | Freq: Four times a day (QID) | TRANSDERMAL | Status: DC
  Administered 2011-09-23 – 2011-09-26 (×11): 1 [in_us] via TOPICAL
  Filled 2011-09-23: qty 30
  Filled 2011-09-23: qty 1

## 2011-09-23 MED ORDER — ASPIRIN 81 MG PO CHEW
324.0000 mg | CHEWABLE_TABLET | Freq: Once | ORAL | Status: AC
  Administered 2011-09-23: 324 mg via ORAL
  Filled 2011-09-23: qty 4

## 2011-09-23 MED ORDER — ALBUTEROL SULFATE HFA 108 (90 BASE) MCG/ACT IN AERS
2.0000 | INHALATION_SPRAY | Freq: Four times a day (QID) | RESPIRATORY_TRACT | Status: DC | PRN
  Filled 2011-09-23: qty 6.7

## 2011-09-23 NOTE — Progress Notes (Signed)
Wanda Davenport, is a 78 y.o. female,   MRN: [id number]  -  DOB - 1923/03/31  Outpatient Primary MD for the patient is Ailene Ravel, MD  in for    Chief Complaint  Patient presents with  . Shortness of Breath     Blood pressure 107/53, pulse 82, temperature 99.9 F (37.7 C), temperature source Oral, resp. rate 18, SpO2 96.00%.  Principal Problem:  *Atypical chest pain Active Problems:  Hypertension with CHF  DIVERTICULOSIS, COLON  Hyperlipidemia  Anxiety  LBBB (left bundle branch block)  Chronic diastolic heart failure  Called by Dr. Ethelda Chick for Ms. Michael. She has had intermittent chest ain atypical and dyspnea.  He would like her in observation for chest pain  . Dr. Melburn Popper from cardiology was called and he did not recommend cardiology admission.  CBC    Component Value Date/Time   WBC 16.7* 09/23/2011 0811   RBC 4.56 09/23/2011 0811   HGB 13.6 09/23/2011 0811   HCT 39.2 09/23/2011 0811   PLT 161 09/23/2011 0811   MCV 86.0 09/23/2011 0811   MCH 29.8 09/23/2011 0811   MCHC 34.7 09/23/2011 0811   RDW 13.5 09/23/2011 0811   LYMPHSABS 1.3 09/23/2011 0811   MONOABS 1.0 09/23/2011 0811   EOSABS 0.0 09/23/2011 0811   BASOSABS 0.0 09/23/2011 0811    Noted unexplained leukocytosis on labs BMET unavailable in epic Kathrynn Backstrom

## 2011-09-23 NOTE — ED Notes (Signed)
Patient transported to X-ray 

## 2011-09-23 NOTE — H&P (Addendum)
[facility] History and Physical  Wanda Davenport ZOX:[id number] DOB: 01/31/1923 DOA: 09/23/2011   PCP: Ailene Ravel, MD   Chief Complaint: left sided chest pain at 4 am this morning.   HPI: Wanda Davenport is a 78 y.o. female h/o Htn, came in for multiple episodes of substernal and left sided chest pain over teh last few weeks. Her recent episode started as pressure in the left side of the chest with some sharp radiation to the left jaw associated with sob.  Chest pressure not associated with activity, nausea, or diaphoresis. She was seen in the past by her pPCP for similar complaints and was referred for a stress test but she refused to get it done for fear the medication.she also reports that she bad acid reflux on Friday evening after dinner. She is admitted for atypical chest pain . ED consulted Cardiology , but felt she can admitted to hospitalist service.   Review of Systems: The patient denies anorexia, fever, weight loss,, vision loss, decreased hearing, hoarseness,  syncope, , peripheral edema, balance deficits, hemoptysis, abdominal pain, melena, hematochezia, , hematuria, incontinence, genital sores, muscle weakness, suspicious skin lesions, transient blindness, difficulty walking, depression, unusual weight change, abnormal bleeding, enlarged lymph nodes, angioedema, and breast masses.    Past Medical History  Diagnosis Date  . HYPERTENSION   . HEMORRHOIDS, INTERNAL   . DIVERTICULOSIS, COLON   . Hyperlipidemia   . Pre-diabetes   . Osteopenia   . Anxiety   . LBBB (left bundle branch block)     a. present on ECG 06/2010  . Arthritis   . Allergic rhinitis   . Atypical chest pain   . Hepatic steatosis    Past Surgical History  Procedure Date  . Breast surgery   . Tonsillectomy   . Abdominal hysterectomy   . Colonoscopy     a. 2010   Social History:  reports that she has quit smoking. Her smoking use included Cigarettes. She quit after 20 years of use. She has never  used smokeless tobacco. She reports that she does not drink alcohol or use illicit drugs.   Allergies  Allergen Reactions  . Fenofibrate Other (See Comments)    Myalgias/gi upset   . Statins Other (See Comments)    Myalgias   . Sulfonamide Derivatives Nausea And Vomiting    REACTION: nausea    Family History  Problem Relation Age of Onset  . Colon cancer Sister   . Melanoma Sister   . Hyperlipidemia Brother   . Hypertension Mother   . Alzheimer's disease Brother   . Kidney disease Father     died 25  . Hypertension Father   . Lupus Mother     died 37 from surgical complications   Prior to Admission medications   Medication Sig Start Date End Date Taking? Authorizing Provider  acetaminophen (TYLENOL) 500 MG tablet Take 500 mg by mouth every 6 (six) hours as needed. pain   Yes Historical Provider, MD  albuterol (PROVENTIL HFA;VENTOLIN HFA) 108 (90 BASE) MCG/ACT inhaler Inhale 2 puffs into the lungs every 6 (six) hours as needed. Wheezing or shortness of breath   Yes Historical Provider, MD  ALPRAZolam (XANAX) 0.25 MG tablet Take 0.25 mg by mouth 3 (three) times daily as needed. For anxiety or sleep   Yes Historical Provider, MD  aspirin 81 MG tablet Take 81 mg by mouth daily.   Yes Historical Provider, MD  bisoprolol-hydrochlorothiazide (ZIAC) 5-6.25 MG per tablet Take 2 tablets by  mouth daily. 04/16/11  Yes Duke Salvia, MD  cholecalciferol (VITAMIN D) 1000 UNITS tablet Take 1,000 Units by mouth daily.   Yes Historical Provider, MD  ezetimibe (ZETIA) 10 MG tablet Take 10 mg by mouth daily.   Yes Historical Provider, MD  fluticasone (FLONASE) 50 MCG/ACT nasal spray Place 2 sprays into the nose daily as needed. Nasal congestion Rarely used   Yes Historical Provider, MD  guaiFENesin (MUCINEX) 600 MG 12 hr tablet Take 1,200 mg by mouth 2 (two) times daily as needed. congestion   Yes Historical Provider, MD  ibuprofen (ADVIL,MOTRIN) 200 MG tablet Take 200-600 mg by mouth every 6  (six) hours as needed. pain   Yes Historical Provider, MD  polyvinyl alcohol (LIQUIFILM TEARS) 1.4 % ophthalmic solution Place 1 drop into both eyes 4 (four) times daily as needed. Dry eyes   Yes Historical Provider, MD   Physical Exam: Filed Vitals:   09/23/11 0900 09/23/11 0949 09/23/11 1145 09/23/11 1229  BP: 121/46 107/53 110/61 115/52  Pulse: 80 82 82 95  Temp:    98 F (36.7 C)  TempSrc:    Oral  Resp: 16 18 14 18   Height:    5\' 4"  (1.626 m)  Weight:    79.379 kg (175 lb)  SpO2: 97% 96% 97% 97%    Constitutional: Vital signs reviewed.  Patient is a well-developed and well-nourished in no acute distress and cooperative with exam but anxious. Alert and oriented x3.  Head: Normocephalic and atraumatic Mouth: no erythema or exudates, MMM Eyes: PERRL, EOMI, conjunctivae normal, No scleral icterus.  Neck: Supple, Trachea midline normal ROM, No JVD, mass, thyromegaly, or carotid bruit present.  Cardiovascular: RRR, S1 normal, S2 normal, no MRG, pulses symmetric and intact bilaterally Pulmonary/Chest: CTAB, no wheezes, rales, or rhonchi Abdominal: Soft. Non-tender, non-distended, bowel sounds are normal, no masses, organomegaly, or guarding present.  GU: no CVA tenderness Musculoskeletal: No joint deformities, erythema, or stiffness, ROM full and no nontender Hematology: no cervical, inginal, or axillary adenopathy.  Neurological: A&O x3, Strength is normal and symmetric bilaterally, cranial nerve II-XII are grossly intact, no focal motor deficit, sensory intact to light touch bilaterally.  Skin: Warm, dry and intact. No rash, cyanosis, or clubbing.  Psychiatric: Normal mood and affect. Labs on Admission:  Basic Metabolic Panel: No results found for this basename: NA:5,K:5,CL:5,CO2:5,GLUCOSE:5,BUN:5,CREATININE:5,CALCIUM:5,MG:5,PHOS:5 in the last 168 hours Liver Function Tests: No results found for this basename: AST:5,ALT:5,ALKPHOS:5,BILITOT:5,PROT:5,ALBUMIN:5 in the last 168  hours No results found for this basename: LIPASE:5,AMYLASE:5 in the last 168 hours No results found for this basename: AMMONIA:5 in the last 168 hours CBC:  Lab 09/23/11 0811  WBC 16.7*  NEUTROABS 14.3*  HGB 13.6  HCT 39.2  MCV 86.0  PLT 161   Cardiac Enzymes: No results found for this basename: CKTOTAL:5,CKMB:5,CKMBINDEX:5,TROPONINI:5 in the last 168 hours  BNP (last 3 results) No results found for this basename: PROBNP:3 in the last 8760 hours CBG: No results found for this basename: GLUCAP:5 in the last 168 hours  Radiological Exams on Admission: Dg Chest 2 View  09/23/2011  *RADIOLOGY REPORT*  Clinical Data: Chest pain and shortness of breath  CHEST - 2 VIEW  Comparison: 09/26/2010  Findings: The cardiac silhouette is normal in size and configuration.  There are no mediastinal or hilar masses or adenopathy.  Mild stable scarring noted in the right upper lobe adjacent to the minor fissure and right middle lobe.  The lungs are otherwise clear.  No pleural effusion or pneumothorax.  Bony thorax is demineralized but intact.  IMPRESSION: No active disease of the chest.   Original Report Authenticated By: Domenic Moras, M.D.     EKG: sinus with old LBBB  Assessment/Plan Principal Problem:  *Atypical chest pain Active Problems:  Hypertension with CHF  DIVERTICULOSIS, COLON  Hyperlipidemia  Anxiety  LBBB (left bundle branch block)  Chronic diastolic heart failure  1. Atypical chest pain: probably GERD. But since she has  A h/o Hypertension and hyperlipidemia and with her age, she has risk factors for acs.   - admit her to tele for observation - cardiac enzymes - on aspirin 325 mg - 2d echo - cardio eval for possible stress test   2. Htn; borderline controlled.  3. Leukocytosis: no sign of infection. cxr negative for pneumonia. UA ordered. Antibiotics not indicated.   DVT PROPHYLAXIS  Code Status: FULL CODE Family Communication: discussed with son and daughter in  law Disposition Plan: possibly home when medically stable  Time spent:58 min  Tevan Marian [facility] Pager [phone]  If 7PM-7AM, please contact night-coverage [email or web address] Password [email or web address] 09/23/2011, 1:32 PM

## 2011-09-23 NOTE — ED Notes (Signed)
Istat Chem8 and Istat Trop. Results placed on PT's chart. ED-Lab

## 2011-09-23 NOTE — ED Provider Notes (Signed)
History     CSN: [id number]  Arrival date & time 09/23/11  7829   First MD Initiated Contact with Patient 09/23/11 [phone]      Chief Complaint  Patient presents with  . Shortness of Breath   Patient vague historian (Consider location/radiation/quality/duration/timing/severity/associated sxs/prior treatment) HPI Complains of shortness of breath, constant and unchanged for the past 3-4 years, intermittent left-sided chest pressure for the past several days chest discomfort is worse with walking improved with rest. Admits to slight cough, minimal also admits to tingling in her legs intermittent for several years no other complaint. No treatment prior to coming here. Presently asymptomatic Past Medical History  Diagnosis Date  . HYPERTENSION   . HEMORRHOIDS, INTERNAL   . DIVERTICULOSIS, COLON   . Hyperlipidemia   . Pre-diabetes   . Osteopenia   . Anxiety   . LBBB (left bundle branch block)     a. present on ECG 06/2010  . Arthritis   . Allergic rhinitis   . Atypical chest pain   . Hepatic steatosis    Hypercholesterolemia hypertension Past Surgical History  Procedure Date  . Breast surgery   . Tonsillectomy   . Abdominal hysterectomy   . Colonoscopy     a. 2010    Family History  Problem Relation Age of Onset  . Colon cancer Sister   . Melanoma Sister   . Hyperlipidemia Brother   . Hypertension Mother   . Alzheimer's disease Brother   . Kidney disease Father     died 26  . Hypertension Father   . Lupus Mother     died 66 from surgical complications    History  Substance Use Topics  . Smoking status: Former Smoker -- 20 years    Types: Cigarettes  . Smokeless tobacco: Never Used   Comment: smoked a few cigarettes/day x 20 yrs - quit @ age 56.  Marland Kitchen Alcohol Use: No    OB History    Grav Para Term Preterm Abortions TAB SAB Ect Mult Living                  Review of Systems  Respiratory: Positive for cough and shortness of breath.   Cardiovascular: Positive  for chest pain.  Neurological:       Tingling    Allergies  Fenofibrate; Statins; and Sulfonamide derivatives  Home Medications   Current Outpatient Rx  Name Route Sig Dispense Refill  . ACETAMINOPHEN 500 MG PO TABS Oral Take 500 mg by mouth every 6 (six) hours as needed. pain    . ALBUTEROL SULFATE HFA 108 (90 BASE) MCG/ACT IN AERS Inhalation Inhale 2 puffs into the lungs every 6 (six) hours as needed. Wheezing or shortness of breath    . ALPRAZOLAM 0.25 MG PO TABS Oral Take 0.25 mg by mouth 3 (three) times daily as needed. For anxiety or sleep    . ASPIRIN 81 MG PO TABS Oral Take 81 mg by mouth daily.    Marland Kitchen BISOPROLOL-HYDROCHLOROTHIAZIDE 5-6.25 MG PO TABS Oral Take 2 tablets by mouth daily. 60 tablet 11  . VITAMIN D 1000 UNITS PO TABS Oral Take 1,000 Units by mouth daily.    Marland Kitchen EZETIMIBE 10 MG PO TABS Oral Take 10 mg by mouth daily.    Marland Kitchen FLUTICASONE PROPIONATE 50 MCG/ACT NA SUSP Nasal Place 2 sprays into the nose daily as needed. Nasal congestion Rarely used    . GUAIFENESIN ER 600 MG PO TB12 Oral Take 1,200 mg by mouth 2 (  two) times daily as needed. congestion    . IBUPROFEN 200 MG PO TABS Oral Take 200-600 mg by mouth every 6 (six) hours as needed. pain    . POLYVINYL ALCOHOL 1.4 % OP SOLN Both Eyes Place 1 drop into both eyes 4 (four) times daily as needed. Dry eyes      BP 136/63  Pulse 85  Temp 99.9 F (37.7 C) (Oral)  Resp 16  SpO2 96%  Physical Exam  Nursing note and vitals reviewed. Constitutional: She appears well-developed and well-nourished.  HENT:  Head: Normocephalic and atraumatic.  Eyes: Conjunctivae normal are normal. Pupils are equal, round, and reactive to light.  Neck: Neck supple. No tracheal deviation present. No thyromegaly present.  Cardiovascular: Normal rate and regular rhythm.   No murmur heard. Pulmonary/Chest: Effort normal and breath sounds normal.  Abdominal: Soft. Bowel sounds are normal. She exhibits no distension. There is no tenderness.        obese  Musculoskeletal: Normal range of motion. She exhibits no edema and no tenderness.  Neurological: She is alert. Coordination normal.  Skin: Skin is warm and dry. No rash noted.  Psychiatric: She has a normal mood and affect.    ED Course  Procedures (including critical care time)  Labs Reviewed - No data to display No results found.   No diagnosis found. Chest xray revied by me.  POC TnI =0  Date: 09/23/2011  Rate: 90  Rhythm: normal sinus rhythm  QRS Axis: left  Intervals: normal  ST/T Wave abnormalities: normal  Conduction Disutrbances:left bundle branch block  Narrative Interpretation:   Old EKG Reviewed: unchanged Unchanged from 06/16/2010 as interpreted by me Nitroglycerin ointment placed on patient Results for orders placed during the hospital encounter of 09/23/11  CBC WITH DIFFERENTIAL      Component Value Range   WBC 16.7 (*) 4.0 - 10.5 K/uL   RBC 4.56  3.87 - 5.11 MIL/uL   Hemoglobin 13.6  12.0 - 15.0 g/dL   HCT 84.6  96.2 - 95.2 %   MCV 86.0  78.0 - 100.0 fL   MCH 29.8  26.0 - 34.0 pg   MCHC 34.7  30.0 - 36.0 g/dL   RDW 84.1  32.4 - 40.1 %   Platelets 161  150 - 400 K/uL   Neutrophils Relative 86 (*) 43 - 77 %   Neutro Abs 14.3 (*) 1.7 - 7.7 K/uL   Lymphocytes Relative 8 (*) 12 - 46 %   Lymphs Abs 1.3  0.7 - 4.0 K/uL   Monocytes Relative 6  3 - 12 %   Monocytes Absolute 1.0  0.1 - 1.0 K/uL   Eosinophils Relative 0  0 - 5 %   Eosinophils Absolute 0.0  0.0 - 0.7 K/uL   Basophils Relative 0  0 - 1 %   Basophils Absolute 0.0  0.0 - 0.1 K/uL   Dg Chest 2 View  09/23/2011  *RADIOLOGY REPORT*  Clinical Data: Chest pain and shortness of breath  CHEST - 2 VIEW  Comparison: 09/26/2010  Findings: The cardiac silhouette is normal in size and configuration.  There are no mediastinal or hilar masses or adenopathy.  Mild stable scarring noted in the right upper lobe adjacent to the minor fissure and right middle lobe.  The lungs are otherwise clear.  No pleural  effusion or pneumothorax.  Bony thorax is demineralized but intact.  IMPRESSION: No active disease of the chest.   Original Report Authenticated By: Domenic Moras, M.D.  MDM  I feel that patient may be having angina or anginal equivalent. Spoke with Dr.Nahser who defers to medical service for observation and in-house diagnostic testing. He suggests internal medicine service and consult cardiology as needed Spoke with Dr.Laza plan telemetry, 23 hour observation Diagnosis #1 chest pain #2 dyspnea        Doug Sou, MD 09/23/11 1053

## 2011-09-23 NOTE — Progress Notes (Signed)
09/23/2011 12:50 PM Nursing note Upon arrival to floor pt. Reported mild pressure under left breast. Pt. States this is different than pain that brought her to the hospital. Non-radiating. Not worse with inspiration or position changes. Pt. Rated pain 5/10. 2L o2 [place] applied, VS obtained and stable. Ekg performed. Dr. Blake Divine paged and made aware. MD on way to assess patient. Upon reassessment pt. States the pressure is better 2/10. Pt. Educated on importance of prompt RN notification of return/worsening of chest pressure. Will continue to closely monitor.  Ingram Onnen, Blanchard Kelch

## 2011-09-23 NOTE — ED Notes (Signed)
Pt reports feeling SOB for 3 weeks, head feels full, legs feeling numb and tingly every day, comes and goes

## 2011-09-24 ENCOUNTER — Encounter (HOSPITAL_COMMUNITY): Payer: Self-pay | Admitting: Cardiology

## 2011-09-24 DIAGNOSIS — I369 Nonrheumatic tricuspid valve disorder, unspecified: Secondary | ICD-10-CM

## 2011-09-24 DIAGNOSIS — I5033 Acute on chronic diastolic (congestive) heart failure: Secondary | ICD-10-CM

## 2011-09-24 DIAGNOSIS — F411 Generalized anxiety disorder: Secondary | ICD-10-CM

## 2011-09-24 DIAGNOSIS — R079 Chest pain, unspecified: Secondary | ICD-10-CM

## 2011-09-24 LAB — CBC
MCH: 29 pg (ref 26.0–34.0)
MCHC: 33.6 g/dL (ref 30.0–36.0)
MCV: 86.4 fL (ref 78.0–100.0)
Platelets: 157 10*3/uL (ref 150–400)
RBC: 4.2 MIL/uL (ref 3.87–5.11)

## 2011-09-24 LAB — COMPREHENSIVE METABOLIC PANEL
ALT: 10 U/L (ref 0–35)
AST: 14 U/L (ref 0–37)
Albumin: 3 g/dL — ABNORMAL LOW (ref 3.5–5.2)
CO2: 25 mEq/L (ref 19–32)
Chloride: 101 mEq/L (ref 96–112)
Creatinine, Ser: 0.91 mg/dL (ref 0.50–1.10)
Potassium: 3.7 mEq/L (ref 3.5–5.1)
Sodium: 135 mEq/L (ref 135–145)
Total Bilirubin: 0.4 mg/dL (ref 0.3–1.2)

## 2011-09-24 LAB — CK TOTAL AND CKMB (NOT AT ARMC)
CK, MB: 1.6 ng/mL (ref 0.3–4.0)
Relative Index: INVALID (ref 0.0–2.5)

## 2011-09-24 LAB — URINE MICROSCOPIC-ADD ON

## 2011-09-24 LAB — URINALYSIS, ROUTINE W REFLEX MICROSCOPIC
Bilirubin Urine: NEGATIVE
Glucose, UA: NEGATIVE mg/dL
Hgb urine dipstick: NEGATIVE
Specific Gravity, Urine: 1.022 (ref 1.005–1.030)
Urobilinogen, UA: 0.2 mg/dL (ref 0.0–1.0)
pH: 5.5 (ref 5.0–8.0)

## 2011-09-24 MED ORDER — FUROSEMIDE 20 MG PO TABS
20.0000 mg | ORAL_TABLET | Freq: Every day | ORAL | Status: AC
  Administered 2011-09-24 – 2011-09-25 (×2): 20 mg via ORAL
  Filled 2011-09-24 (×2): qty 1

## 2011-09-24 MED ORDER — REGADENOSON 0.4 MG/5ML IV SOLN
0.4000 mg | Freq: Once | INTRAVENOUS | Status: AC
  Administered 2011-09-25: 0.4 mg via INTRAVENOUS
  Filled 2011-09-24: qty 5

## 2011-09-24 NOTE — Progress Notes (Signed)
[facility] PROGRESS NOTE  Wanda Davenport WUJ:[id number] DOB: 1923-08-03 DOA: 09/23/2011 PCP: Ailene Ravel, MD  Assessment/Plan: Principal Problem:  *Atypical chest pain Active Problems:  Hypertension with CHF  DIVERTICULOSIS, COLON  Hyperlipidemia  Anxiety  LBBB (left bundle branch block)  Chronic diastolic heart failure  1. Atypical chest pain: improved greatly. Cardiac enzymes negative. EKG unchanged.  Pro bnp slightly high. Cardiology input appreciated. Plan for stress test in am. Continue with protonix for now. 2d echo showed moderate LVH. LVEF was 45% to 50% and she has grade diastolic dysfunction. Regional wall abn cannot be excluded.    2. Leukocytosis : resolved.   3. Anxiety : on xanax.   DVT prophylaxis ; lovenox.     Code Status: full code.  Family Communication: daughter at bedside Disposition Plan: home medically stable   Brief narrative:  Wanda Davenport is a 78 y.o. female h/o Htn, came in for multiple episodes of substernal and left sided chest pain over teh last few weeks. Her recent episode started as pressure in the left side of the chest with some sharp radiation to the left jaw associated with sob. Chest pressure not associated with activity, nausea, or diaphoresis. She was seen in the past by her pPCP for similar complaints and was referred for a stress test but she refused to get it done for fear the medication.she also reports that she bad acid reflux on Friday evening after dinner. She is admitted for atypical chest pain . ED consulted Cardiology , but felt she can admitted to hospitalist service.   Consultants:  CARDIOLOGY  Procedures:  ECHO: Left ventricle: The cavity size was normal. Wall thickness was increased in a pattern of moderate LVH. Systolic function was mildly reduced. The estimated ejection fraction was in the range of 45% to 50%. Regional wall motion abnormalities cannot be excluded. Doppler parameters are consistent  with abnormal left ventricular relaxation (grade 1 diastolic dysfunction).  Antibiotics:  NONE  HPI/Subjective: Slightly axious about the stress test in am.  Objective: Filed Vitals:   09/23/11 1145 09/23/11 1229 09/23/11 1928 09/24/11 0439  BP: 110/61 115/52 129/70 120/66  Pulse: 82 95 76 73  Temp:  98 F (36.7 C) 98.1 F (36.7 C) 98.4 F (36.9 C)  TempSrc:  Oral Oral Oral  Resp: 14 18 18 18   Height:  5\' 4"  (1.626 m)    Weight:  79.379 kg (175 lb)    SpO2: 97% 97% 97% 98%    Intake/Output Summary (Last 24 hours) at 09/24/11 0906 Last data filed at 09/23/11 1900  Gross per 24 hour  Intake      0 ml  Output    500 ml  Net   -500 ml   Filed Weights   09/23/11 1229  Weight: 79.379 kg (175 lb)    Exam:   General:  Alert afebrile comfortable  Cardiovascular: s1s2 no MRG RRR  Respiratory: CTAB  Abdomen: Soft NT ND BS+  Data Reviewed: Basic Metabolic Panel:  Lab 09/24/11 9562 09/23/11 1307  NA 135 137  K 3.7 4.1  CL 101 101  CO2 25 24  GLUCOSE 114* 156*  BUN 19 18  CREATININE 0.91 0.97  CALCIUM 9.6 10.1  MG -- --  PHOS -- --   Liver Function Tests:  Lab 09/24/11 0545  AST 14  ALT 10  ALKPHOS 67  BILITOT 0.4  PROT 5.9*  ALBUMIN 3.0*   No results found for this basename: LIPASE:5,AMYLASE:5 in the last 168 hours  No results found for this basename: AMMONIA:5 in the last 168 hours CBC:  Lab 09/24/11 0545 09/23/11 1307 09/23/11 0811  WBC 8.5 15.1* 16.7*  NEUTROABS -- -- 14.3*  HGB 12.2 13.1 13.6  HCT 36.3 37.8 39.2  MCV 86.4 86.7 86.0  PLT 157 183 161   Cardiac Enzymes:  Lab 09/24/11 0545 09/23/11 2106 09/23/11 1301  CKTOTAL 24 26 33  CKMB 1.6 1.5 1.7  CKMBINDEX -- -- --  TROPONINI <0.30 <0.30 <0.30   BNP (last 3 results)  Basename 09/23/11 1307  PROBNP 835.5*   CBG: No results found for this basename: GLUCAP:5 in the last 168 hours  No results found for this or any previous visit (from the past 240 hour(s)).   Studies: Dg  Chest 2 View  09/23/2011  *RADIOLOGY REPORT*  Clinical Data: Chest pain and shortness of breath  CHEST - 2 VIEW  Comparison: 09/26/2010  Findings: The cardiac silhouette is normal in size and configuration.  There are no mediastinal or hilar masses or adenopathy.  Mild stable scarring noted in the right upper lobe adjacent to the minor fissure and right middle lobe.  The lungs are otherwise clear.  No pleural effusion or pneumothorax.  Bony thorax is demineralized but intact.  IMPRESSION: No active disease of the chest.   Original Report Authenticated By: Domenic Moras, M.D.     Scheduled Meds:   . aspirin EC  325 mg Oral Daily  . bisoprolol-hydrochlorothiazide  2 tablet Oral Daily  . enoxaparin (LOVENOX) injection  40 mg Subcutaneous Q24H  . ezetimibe  10 mg Oral Daily  . gi cocktail  30 mL Oral Once  . nitroGLYCERIN  1 inch Topical Q6H  . pantoprazole  40 mg Oral Q1200  . sodium chloride  3 mL Intravenous Q12H   Continuous Infusions:   Principal Problem:  *Atypical chest pain Active Problems:  Hypertension with CHF  DIVERTICULOSIS, COLON  Hyperlipidemia  Anxiety  LBBB (left bundle branch block)  Chronic diastolic heart failure        Quinterius Gaida  [facility] Pager [phone]. If 8PM-8AM, please contact night-coverage at [email or web address], password [facility] 09/24/2011, 9:06 AM  LOS: 1 day

## 2011-09-24 NOTE — Care Management Note (Unsigned)
    Page 1 of 1   09/24/2011     1:52:23 PM   CARE MANAGEMENT NOTE 09/24/2011  Patient:  Wanda Davenport, Wanda Davenport   Account Number:  [id number]  Date Initiated:  09/24/2011  Documentation initiated by:  SIMMONS,Reinaldo Helt  Subjective/Objective Assessment:   ADMITTED WITH CP; LIVES AT HOME WITH FAMILY; WAS IPTA.     Action/Plan:   DISCHARGE PLANNING INITIATED.   Anticipated DC Date:  09/25/2011   Anticipated DC Plan:  HOME/SELF CARE      DC Planning Services  CM consult      Choice offered to / List presented to:             Status of service:  In process, will continue to follow Medicare Important Message given?   (If response is "NO", the following Medicare IM given date fields will be blank) Date Medicare IM given:   Date Additional Medicare IM given:    Discharge Disposition:    Per UR Regulation:  Reviewed for med. necessity/level of care/duration of stay  If discussed at Long Length of Stay Meetings, dates discussed:    Comments:  09/24/11  1355  Ithan Touhey SIMMONS RN, BSN [phone] NCM WILL FOLLOW.

## 2011-09-24 NOTE — Progress Notes (Signed)
Echocardiogram 2D Echocardiogram has been performed.  Fin Hupp 09/24/2011, 11:28 AM

## 2011-09-24 NOTE — Consult Note (Signed)
CARDIOLOGY CONSULT NOTE  Patient ID: Wanda Davenport, MRN: [id number], DOB/AGE: September 13, 1923 78 y.o. Admit date: 09/23/2011 Date of Consult: 09/24/2011  Primary Physician: Ailene Ravel, MD Primary Cardiologist: Dr. Graciela Husbands  Chief Complaint: chest pain and shortness of breath Reason for Consultation: chest pain  HPI: 78 y.o. female w/ PMHx significant for Chronic Diastolic CHF (EF 40-98% by echo 06/2010), HTN, HLD, LBBB and atypical chest pain who presented to [facility] on 09/24/2011 with complaints of chest pain and shortness of breath.  Wanda Davenport was evaluated by Ward Givens NP in 04/2011 for LBBB on EKG and complaints of chest pain. It was noted she had 3 different types of atypical chest discomfort as well as increased DOE. The LBBB was also seen on the EKG in 06/2010, but not 2008. There was concern that the LBBB may reflect a cardiomyopathy for which further ischemic evaluation was warranted. Her BB/HCTZ combination medication was uptitrated for uncontrolled HTN and she was placed on a PPI. A lexiscan myoview was arranged, but the patient failed to follow through with the appointment. She has continued to have the same type of chest pain and progressive sob prompting her to seek medical evaluation.  In the ED, EKG showed sinus rhythm with 1st degree AVB, 79bpm, LBBB. CXR is without acute cardiopulmonary findings. Labs are significant for normal cardiac enzymes x3, BNP 835, WBC 16.7-->15.1, A1c 6.0, TSH 1.088, unremarkable BMET, UA with small leuks & UCx pending. She was admitted by the medicine service for further evaluation and treatment. Echo is being performed at bedside. Telemetry reveals sinus rhythm 70-80s.  Past Medical History  Diagnosis Date  . HYPERTENSION   . HEMORRHOIDS, INTERNAL   . DIVERTICULOSIS, COLON   . Hyperlipidemia   . Pre-diabetes   . Osteopenia   . Anxiety   . LBBB (left bundle branch block)     a. present on ECG 06/2010  . Arthritis   . Allergic rhinitis     . Atypical chest pain   . Hepatic steatosis      Echo 06/2010 Study Conclusions: - Left ventricle: The cavity size was normal. There was mild focal basal hypertrophy of the septum. Systolic function was normal. The estimated ejection fraction was in the range of 60% to 65%. Wall motion was normal; there were no regional wall motion abnormalities. Doppler parameters are consistent with abnormal left ventricular relaxation (grade 1 diastolic dysfunction). - Left atrium: The atrium was mildly dilated. - Pulmonary arteries: PA peak pressure: 37mm Hg (S).   Surgical History:  Past Surgical History  Procedure Date  . Breast surgery   . Tonsillectomy   . Abdominal hysterectomy   . Colonoscopy     a. 2010     Home Meds: Medication Sig  acetaminophen (TYLENOL) 500 MG tablet Take 500 mg by mouth every 6 (six) hours as needed. pain  albuterol (PROVENTIL HFA;VENTOLIN HFA) 108 (90 BASE) MCG/ACT inhaler Inhale 2 puffs into the lungs every 6 (six) hours as needed. Wheezing or shortness of breath  ALPRAZolam (XANAX) 0.25 MG tablet Take 0.25 mg by mouth 3 (three) times daily as needed. For anxiety or sleep  aspirin 81 MG tablet Take 81 mg by mouth daily.  bisoprolol-hydrochlorothiazide (ZIAC) 5-6.25 MG per tablet Take 2 tablets by mouth daily.  cholecalciferol (VITAMIN D) 1000 UNITS tablet Take 1,000 Units by mouth daily.  ezetimibe (ZETIA) 10 MG tablet Take 10 mg by mouth daily.  fluticasone (FLONASE) 50 MCG/ACT nasal spray Place 2 sprays into the  nose daily as needed. Nasal congestion Rarely used  guaiFENesin (MUCINEX) 600 MG 12 hr tablet Take 1,200 mg by mouth 2 (two) times daily as needed. congestion  ibuprofen (ADVIL,MOTRIN) 200 MG tablet Take 200-600 mg by mouth every 6 (six) hours as needed. pain  polyvinyl alcohol (LIQUIFILM TEARS) 1.4 % ophthalmic solution Place 1 drop into both eyes 4 (four) times daily as needed. Dry eyes    Inpatient Medications:   . aspirin EC  325 mg Oral Daily  .  bisoprolol-hydrochlorothiazide  2 tablet Oral Daily  . enoxaparin (LOVENOX) injection  40 mg Subcutaneous Q24H  . ezetimibe  10 mg Oral Daily  . gi cocktail  30 mL Oral Once  . nitroGLYCERIN  1 inch Topical Q6H  . pantoprazole  40 mg Oral Q1200  . sodium chloride  3 mL Intravenous Q12H      Allergies:  Allergies  Allergen Reactions  . Fenofibrate Other (See Comments)    Myalgias/gi upset   . Statins Other (See Comments)    Myalgias   . Sulfonamide Derivatives Nausea And Vomiting    REACTION: nausea    History   Social History  . Marital Status: Widowed    Spouse Name: N/A    Number of Children: N/A  . Years of Education: N/A   Occupational History  . Retired    Social History Main Topics  . Smoking status: Former Smoker -- 20 years    Types: Cigarettes  . Smokeless tobacco: Never Used   Comment: smoked a few cigarettes/day x 20 yrs - quit @ age 89.  Marland Kitchen Alcohol Use: No  . Drug Use: No  . Sexually Active: Not Currently   Other Topics Concern  . Not on file   Social History Narrative   Lives in [place] by herself.  She has family near-by.  She tries to stay active around the house - does all of her own housework and still mows (rides) her yard.Daily caffeine      Family History  Problem Relation Age of Onset  . Colon cancer Sister   . Melanoma Sister   . Hyperlipidemia Brother   . Hypertension Mother   . Alzheimer's disease Brother   . Kidney disease Father     died 5  . Hypertension Father   . Lupus Mother     died 76 from surgical complications     Review of Systems: General: negative for chills, fever, night sweats or weight changes.  Cardiovascular: (+) chest pain, shortness of breath, dyspnea on exertion, edema; negative for orthopnea, palpitations, or paroxysmal nocturnal dyspnea Dermatological: negative for rash Respiratory: negative for cough or wheezing Urologic: negative for hematuria Abdominal: negative for nausea, vomiting, diarrhea,  bright red blood per rectum, melena, or hematemesis Neurologic: negative for visual changes, syncope, or dizziness All other systems reviewed and are otherwise negative except as noted above.  Labs:  [facility] 09/24/11 0545 09/23/11 2106 09/23/11 1301  CKTOTAL 24 26 33  CKMB 1.6 1.5 1.7  TROPONINI <0.30 <0.30 <0.30     09/23/2011 13:07  Pro B Natriuretic peptide (BNP) 835.5 (H)   Component Value Date   WBC 8.5 09/24/2011   HGB 12.2 09/24/2011   HCT 36.3 09/24/2011   MCV 86.4 09/24/2011   PLT 157 09/24/2011     09/24/2011 05:45  Prothrombin Time 13.4  INR 1.03   Lab 09/24/11 0545  NA 135  K 3.7  CL 101  CO2 25  BUN 19  CREATININE 0.91  CALCIUM  9.6  PROT 5.9*  BILITOT 0.4  ALKPHOS 67  ALT 10  AST 14  GLUCOSE 114*     09/23/2011 13:07  TSH 1.088     09/23/2011 13:07  Hemoglobin A1C 6.0 (H)   Radiology/Studies:   09/23/2011 - CHEST - 2 VIEW   Findings: The cardiac silhouette is normal in size and configuration.  There are no mediastinal or hilar masses or adenopathy.  Mild stable scarring noted in the right upper lobe adjacent to the minor fissure and right middle lobe.  The lungs are otherwise clear.  No pleural effusion or pneumothorax.  Bony thorax is demineralized but intact.  IMPRESSION: No active disease of the chest.     EKG: 09/24/11 @ 0623 - sinus rhythm with 1st degree AVB, 79bpm, LBBB.  Physical Exam: Blood pressure 109/64, pulse 77, temperature 97.3 F (36.3 C), temperature source Oral, resp. rate 18, height 5\' 4"  (1.626 m), weight 175 lb (79.379 kg), SpO2 97.00%. General: Overweight elderly white female, in no acute distress. Head: Normocephalic, atraumatic, sclera non-icteric, no xanthomas, nares are without discharge.  Neck: Supple. Negative for carotid bruits. JVD not elevated. Lungs: Clear bilaterally to auscultation without wheezes, rales, or rhonchi. Breathing is unlabored. Heart: RRR with S1 S2. No murmurs, rubs, or gallops appreciated. Abdomen: Soft,  non-tender, non-distended with normoactive bowel sounds. No hepatomegaly. No rebound/guarding. No obvious abdominal masses. Msk:  Strength and tone appear normal for age. Extremities: No clubbing or cyanosis. Trace bilat ankle edema.  Distal pedal pulses are intact and equal bilaterally. Neuro: Alert and oriented X 3. Moves all extremities spontaneously. Psych:  Responds to questions appropriately with a normal affect.   Assessment and Plan:  78 y.o. female w/ PMHx significant for HTN, HLD, LBBB and atypical chest pain who presented to [facility] on 09/24/2011 with complaints of chest pain and shortness of breath.  1. Acute on Chronic Diastolic CHF 2. Chest pain 3. LBBB 4. Hypertension 5. Hyperlipidemia  Patient presents with atypical chest pain that has been going on for >85yr as well as progressive worsening of her shortness of breath. She was evaluated in our office in April with plans for a lexiscan myoview, but did not follow through with the test as she was concerned about the nuclear tracer medication. She has ruled out for MI. EKG shows LBBB. Echo is being performed at bedside now, will follow up. BNP slightly elevated. No overt volume excess on exam. Will give oral lasix 20mg  daily x2 days to assess improvement in sob. Monitor weight, I/Os. Schedule lexiscan myoview in the morning (she is agreeable). Further plans pending myoview results. BP stable. Cont ASA, BB/HCTZ, Zetia, NTG paste. She has h/o statin intolerance. Lipid panel in am. Cont PPI.   Signed, HOPE, JESSICA PA-C 09/24/2011, 10:13 AM   Patient examined and chart reviewed.  Atypical but recurrent pain.  Patient willing to have myovue now.  Prefer to start noninvasively.  Mild increase in BNP add lasix for 48 hours. Echo to assess EF.  Preliminary appears mildly decrease with abnormal septal motion from LBBB  Charlton Haws 11:19 AM

## 2011-09-25 ENCOUNTER — Observation Stay (HOSPITAL_COMMUNITY): Payer: Medicare Other

## 2011-09-25 DIAGNOSIS — I5032 Chronic diastolic (congestive) heart failure: Secondary | ICD-10-CM

## 2011-09-25 DIAGNOSIS — I447 Left bundle-branch block, unspecified: Secondary | ICD-10-CM

## 2011-09-25 DIAGNOSIS — I10 Essential (primary) hypertension: Secondary | ICD-10-CM

## 2011-09-25 DIAGNOSIS — R079 Chest pain, unspecified: Secondary | ICD-10-CM

## 2011-09-25 LAB — LIPID PANEL
HDL: 41 mg/dL (ref >39)
LDL Cholesterol: 137 mg/dL — ABNORMAL HIGH (ref 0–99)
Total CHOL/HDL Ratio: 5.4 RATIO
Triglycerides: 210 mg/dL — ABNORMAL HIGH (ref <150)

## 2011-09-25 LAB — BASIC METABOLIC PANEL
Chloride: 97 mEq/L (ref 96–112)
GFR calc non Af Amer: 56 mL/min — ABNORMAL LOW (ref >90)
Glucose, Bld: 111 mg/dL — ABNORMAL HIGH (ref 70–99)
Potassium: 3.3 mEq/L — ABNORMAL LOW (ref 3.5–5.1)
Sodium: 132 mEq/L — ABNORMAL LOW (ref 135–145)

## 2011-09-25 MED ORDER — TECHNETIUM TC 99M SESTAMIBI GENERIC - CARDIOLITE
30.0000 | Freq: Once | INTRAVENOUS | Status: AC | PRN
  Administered 2011-09-25: 30 via INTRAVENOUS

## 2011-09-25 MED ORDER — TECHNETIUM TC 99M SESTAMIBI GENERIC - CARDIOLITE
10.0000 | Freq: Once | INTRAVENOUS | Status: AC | PRN
  Administered 2011-09-25: 10 via INTRAVENOUS

## 2011-09-25 MED ORDER — POTASSIUM CHLORIDE CRYS ER 20 MEQ PO TBCR
40.0000 meq | EXTENDED_RELEASE_TABLET | Freq: Two times a day (BID) | ORAL | Status: AC
  Administered 2011-09-25 (×2): 40 meq via ORAL
  Filled 2011-09-25 (×2): qty 2

## 2011-09-25 MED ORDER — LISINOPRIL 5 MG PO TABS
5.0000 mg | ORAL_TABLET | Freq: Every day | ORAL | Status: DC
  Administered 2011-09-25 – 2011-09-26 (×2): 5 mg via ORAL
  Filled 2011-09-25 (×2): qty 1

## 2011-09-25 NOTE — Progress Notes (Signed)
Cardiology Progress Note Patient Name: Wanda Davenport Date of Encounter: 09/25/2011, 8:51 AM     Subjective  No overnight events. Patient reports some improvement in breathing while ambulating to bathroom. Mild "achy" left sided chest pain this morning.    Objective   Telemetry: Unable to assess as pt seen in nuc med  Medications: . aspirin EC  325 mg Oral Daily  . bisoprolol-hydrochlorothiazide  2 tablet Oral Daily  . enoxaparin (LOVENOX) injection  40 mg Subcutaneous Q24H  . ezetimibe  10 mg Oral Daily  . furosemide  20 mg Oral Daily  . nitroGLYCERIN  1 inch Topical Q6H  . pantoprazole  40 mg Oral Q1200  . potassium chloride  40 mEq Oral BID  . regadenoson  0.4 mg Intravenous Once  . sodium chloride  3 mL Intravenous Q12H      Physical Exam: Temp:  [97.3 F (36.3 C)-98.2 F (36.8 C)] 98 F (36.7 C) (09/24 0759) Pulse Rate:  [68-77] 72  (09/24 0759) Resp:  [18-19] 18  (09/24 0759) BP: (109-137)/(54-71) 126/71 mmHg (09/24 0759) SpO2:  [95 %-97 %] 95 % (09/24 0759) Weight:  [174 lb 2.6 oz (79 kg)] 174 lb 2.6 oz (79 kg) (09/24 0410)  General: Overweight elderly white female, in no acute distress.  Head: Normocephalic, atraumatic, sclera non-icteric, no xanthomas, nares are without discharge.  Neck: Supple. Negative for carotid bruits. JVD not elevated.  Lungs:  Fine bibasilar rales. No wheezes or rhonchi. Breathing is unlabored.  Heart: RRR with S1 S2. No murmurs, rubs, or gallops appreciated.  Abdomen: Soft, non-tender, non-distended with normoactive bowel sounds. No rebound/guarding. No obvious abdominal masses.  Msk: Strength and tone appear normal for age.  Extremities: No clubbing or cyanosis. Trace bilat ankle edema. Distal pedal pulses are intact and equal bilaterally.  Neuro: Alert and oriented X 3. Moves all extremities spontaneously.  Psych: Responds to questions appropriately with a normal affect.    Intake/Output Summary (Last 24 hours) at 09/25/11  0851 Last data filed at 09/24/11 2028  Gross per 24 hour  Intake    840 ml  Output      0 ml  Net    840 ml    Labs:  [facility] 09/25/11 0515 09/24/11 0545  NA 132* 135  K 3.3* 3.7  CL 97 101  CO2 26 25  GLUCOSE 111* 114*  BUN 17 19  CREATININE 0.91 0.91  CALCIUM 9.6 9.6   Basename 09/24/11 0545  AST 14  ALT 10  ALKPHOS 67  BILITOT 0.4  PROT 5.9*  ALBUMIN 3.0*   Basename 09/24/11 0545 09/23/11 1307 09/23/11 0811  WBC 8.5 15.1* --  NEUTROABS -- -- 14.3*  HGB 12.2 13.1 --  HCT 36.3 37.8 --  MCV 86.4 86.7 --  PLT 157 183 --   Basename 09/24/11 0545 09/23/11 2106 09/23/11 1301  CKTOTAL 24 26 33  CKMB 1.6 1.5 1.7  TROPONINI <0.30 <0.30 <0.30   Basename 09/23/11 1307  HGBA1C 6.0*   Basename 09/25/11 0515  CHOL 220*  HDL 41  LDLCALC 137*  TRIG 210*  CHOLHDL 5.4   Basename 09/23/11 1307  TSH 1.088    09/23/2011 13:07   Pro B Natriuretic peptide (BNP)  835.5 (H)      Radiology/Studies:   09/23/2011 - Chest 2 View Findings: The cardiac silhouette is normal in size and configuration.  There are no mediastinal or hilar masses or adenopathy.  Mild stable scarring noted in  the right upper lobe adjacent to the minor fissure and right middle lobe.  The lungs are otherwise clear.  No pleural effusion or pneumothorax.  Bony thorax is demineralized but intact.  IMPRESSION: No active disease of the chest.      09/24/11 - Echo Study Conclusions: - Left ventricle: The cavity size was normal. Wall thickness was increased in a pattern of moderate LVH. Systolic function was mildly reduced. The estimated ejection fraction was in the range of 45% to 50%. Regional wall motion abnormalities cannot be excluded. Doppler parameters are consistent with abnormal left ventricular relaxation (grade 1 diastolic dysfunction). - Ventricular septum: Septal motion showed paradox.    Assessment and Plan  78 y.o. female w/ PMHx significant for HTN, HLD, LBBB and atypical chest pain who  presented [facility] on 09/24[facility] with complaints of chest pain and shortness of breath.  1. Acute on Chronic Systolic and Diastolic CHF: Echo shows EF 45-50%, previously 60-65% in 2012, and grade 1 diastolic dysfunction. Received oral lasix yesterday. I/Os not accurately recorded. Weight down 1lb. Crt stable. No overt volume excess on exam. Breathing mildly improved. Will receive one more dose of oral lasix today. Further diuresis per MD recs. Consider addition of ACEI/ARB given new low EF and switching HCTZ to lasix. Cont BB.  2. Chest pain: She has ruled out for acute MI. EKG shows LBBB. Echo showed mod LVH, EF 45-50%, indeterminate WMAs, grade 1 diastolic dysfunction. Lexiscan Myoview today with further plans pending results. Cont ASA, BB, Zetia, NTG paste.   3. LBBB: Noted on EKG in 2012, but not 2008.    4. Hypertension: BP stable. Cont antihypertensives   5. Hyperlipidemia: LDL 137 on Zetia. H/o statin intolerance. If found to have coronary disease consider trial of newer statin. LFTs ok.  6. Hypokalemia: K+3.3 this am. Supplement. BMET in am.    Signed, HOPE, JESSICA PA-C  I have seen, examined the patient, and reviewed the above assessment and plan.  Changes to above are made where necessary.  Awaiting results of myoview.  IF low risk, would plan medical therapy.  If high risk, she may need further CV evaluation.  Will add low dose lisinopril for reduce EF.  This may help with hypokalemia also.  Co Sign: Hillis Range, MD 09/25/2011 11:30 AM

## 2011-09-25 NOTE — Progress Notes (Signed)
[facility] PROGRESS NOTE  Wanda Davenport ZOX:[id number] DOB: 15-Jan-1923 DOA: 09/23/2011 PCP: Ailene Ravel, MD  Assessment/Plan: Principal Problem:  *Atypical chest pain Active Problems:  Hypertension with CHF  DIVERTICULOSIS, COLON  Hyperlipidemia  Anxiety  LBBB (left bundle branch block)  Chronic diastolic heart failure  1. Atypical chest pain: improved greatly. Cardiac enzymes negative. EKG unchanged.  Pro bnp slightly high. Cardiology input appreciated. Stress today and results pending. . Continue with protonix for now. 2d echo showed moderate LVH. LVEF was 45% to 50% and she has grade diastolic dysfunction. Regional wall abn cannot be excluded. Started her on lisinopril.    2. Leukocytosis : resolved.   3. Anxiety : on xanax.   4. Hypokalemia : will be repleted as needed.   5. Abnormal UA: urine cultures pending. She is afebrile and no leukocytosis. Pt is asymptomatic.  No indication for antibiotics.  DVT prophylaxis ; lovenox.     Code Status: full code.  Family Communication: daughter at bedside Disposition Plan: home medically stable   Brief narrative:  Wanda Davenport is a 78 y.o. female h/o Htn, came in for multiple episodes of substernal and left sided chest pain over teh last few weeks. Her recent episode started as pressure in the left side of the chest with some sharp radiation to the left jaw associated with sob. Chest pressure not associated with activity, nausea, or diaphoresis. She was seen in the past by her pPCP for similar complaints and was referred for a stress test but she refused to get it done for fear the medication.she also reports that she bad acid reflux on Friday evening after dinner. She is admitted for atypical chest pain . ED consulted Cardiology , but felt she can admitted to hospitalist service.   Consultants:  CARDIOLOGY  Procedures:  ECHO: Left ventricle: The cavity size was normal. Wall thickness was increased in a pattern  of moderate LVH. Systolic function was mildly reduced. The estimated ejection fraction was in the range of 45% to 50%. Regional wall motion abnormalities cannot be excluded. Doppler parameters are consistent with abnormal left ventricular relaxation (grade 1 diastolic dysfunction).  Antibiotics:  NONE  HPI/Subjective: Comfortable.  Objective: Filed Vitals:   09/25/11 1012 09/25/11 1014 09/25/11 1016 09/25/11 1403  BP: 115/52 108/46 121/48 124/66  Pulse: 86 90 88 79  Temp:    98.1 F (36.7 C)  TempSrc:      Resp:    18  Height:      Weight:      SpO2:    95%    Intake/Output Summary (Last 24 hours) at 09/25/11 1516 Last data filed at 09/25/11 0900  Gross per 24 hour  Intake    600 ml  Output      0 ml  Net    600 ml   Filed Weights   09/23/11 1229 09/25/11 0410  Weight: 79.379 kg (175 lb) 79 kg (174 lb 2.6 oz)    Exam:   General:  Alert afebrile comfortable  Cardiovascular: s1s2 no MRG RRR  Respiratory: CTAB  Abdomen: Soft NT ND BS+  Data Reviewed: Basic Metabolic Panel:  Lab 09/25/11 8119 09/24/11 0545 09/23/11 1307  NA 132* 135 137  K 3.3* 3.7 4.1  CL 97 101 101  CO2 26 25 24   GLUCOSE 111* 114* 156*  BUN 17 19 18   CREATININE 0.91 0.91 0.97  CALCIUM 9.6 9.6 10.1  MG -- -- --  PHOS -- -- --   Liver Function  Tests:  Lab 09/24/11 0545  AST 14  ALT 10  ALKPHOS 67  BILITOT 0.4  PROT 5.9*  ALBUMIN 3.0*   No results found for this basename: LIPASE:5,AMYLASE:5 in the last 168 hours No results found for this basename: AMMONIA:5 in the last 168 hours CBC:  Lab 09/24/11 0545 09/23/11 1307 09/23/11 0811  WBC 8.5 15.1* 16.7*  NEUTROABS -- -- 14.3*  HGB 12.2 13.1 13.6  HCT 36.3 37.8 39.2  MCV 86.4 86.7 86.0  PLT 157 183 161   Cardiac Enzymes:  Lab 09/24/11 0545 09/23/11 2106 09/23/11 1301  CKTOTAL 24 26 33  CKMB 1.6 1.5 1.7  CKMBINDEX -- -- --  TROPONINI <0.30 <0.30 <0.30   BNP (last 3 results)  Basename 09/23/11 1307  PROBNP 835.5*     CBG: No results found for this basename: GLUCAP:5 in the last 168 hours  No results found for this or any previous visit (from the past 240 hour(s)).   Studies: No results found.  Scheduled Meds:    . aspirin EC  325 mg Oral Daily  . bisoprolol-hydrochlorothiazide  2 tablet Oral Daily  . enoxaparin (LOVENOX) injection  40 mg Subcutaneous Q24H  . ezetimibe  10 mg Oral Daily  . furosemide  20 mg Oral Daily  . lisinopril  5 mg Oral Daily  . nitroGLYCERIN  1 inch Topical Q6H  . pantoprazole  40 mg Oral Q1200  . potassium chloride  40 mEq Oral BID  . regadenoson  0.4 mg Intravenous Once  . sodium chloride  3 mL Intravenous Q12H   Continuous Infusions:   Principal Problem:  *Atypical chest pain Active Problems:  Hypertension with CHF  DIVERTICULOSIS, COLON  Hyperlipidemia  Anxiety  LBBB (left bundle branch block)  Chronic diastolic heart failure        Wanda Davenport  [facility] Pager [phone]. If 8PM-8AM, please contact night-coverage at [email or web address], password [facility] 09/25/2011, 3:16 PM  LOS: 2 days

## 2011-09-26 LAB — URINE CULTURE: Colony Count: 30000

## 2011-09-26 LAB — BASIC METABOLIC PANEL
CO2: 27 mEq/L (ref 19–32)
Calcium: 9.9 mg/dL (ref 8.4–10.5)
Creatinine, Ser: 1.14 mg/dL — ABNORMAL HIGH (ref 0.50–1.10)
GFR calc non Af Amer: 42 mL/min — ABNORMAL LOW (ref >90)
Glucose, Bld: 109 mg/dL — ABNORMAL HIGH (ref 70–99)

## 2011-09-26 LAB — POCT I-STAT, CHEM 8
BUN: 17 mg/dL (ref 6–23)
Calcium, Ion: 1.29 mmol/L (ref 1.13–1.30)
Chloride: 103 mEq/L (ref 96–112)
Glucose, Bld: 124 mg/dL — ABNORMAL HIGH (ref 70–99)

## 2011-09-26 LAB — POCT I-STAT TROPONIN I: Troponin i, poc: 0 ng/mL (ref 0.00–0.08)

## 2011-09-26 MED ORDER — LISINOPRIL 5 MG PO TABS: 5.0000 mg | ORAL_TABLET | Freq: Every day | ORAL | Status: DC

## 2011-09-26 MED ORDER — PANTOPRAZOLE SODIUM 40 MG PO TBEC: 40.0000 mg | DELAYED_RELEASE_TABLET | Freq: Every day | ORAL | Status: DC

## 2011-09-26 MED ORDER — EZETIMIBE 10 MG PO TABS: 10.0000 mg | ORAL_TABLET | Freq: Every day | ORAL | Status: DC

## 2011-09-26 NOTE — Discharge Summary (Signed)
Physician Discharge Summary  ZANNA HAWN WUJ:[id number] DOB: [birth year] DOA: 09/23/2011  PCP: Ailene Ravel, MD  Admit date: 09/23/2011 Discharge date: 09/26/2011  Recommendations for Outpatient Follow-up:  1. Follow up with PCP as recommended 2. Follow up with a gastroenterologist if symptoms are persistent, you might benefit from EGD. 3. Follow up with a bmp in one week to make sure the creatinine has improved.   Discharge Diagnoses:  Principal Problem:  *Atypical chest pain Active Problems:  Hypertension with CHF  DIVERTICULOSIS, COLON  Hyperlipidemia  Anxiety  LBBB (left bundle branch block)  Chronic diastolic heart failure   Discharge Condition:stable  Diet recommendation: low sodium diet  Filed Weights   09/23/11 1229 09/25/11 0410 09/26/11 0500  Weight: 79.379 kg (175 lb) 79 kg (174 lb 2.6 oz) 78.5 kg (173 lb 1 oz)    History of present illness:  Wanda Davenport is a 78 y.o. female h/o Htn, came in for multiple episodes of substernal and left sided chest pain over teh last few weeks. Her recent episode started as pressure in the left side of the chest with some sharp radiation to the left jaw associated with sob. Chest pressure not associated with activity, nausea, or diaphoresis. She was seen in the past by her pPCP for similar complaints and was referred for a stress test but she refused to get it done for fear the medication.she also reports that she bad acid reflux on Friday evening after dinner. She is admitted for atypical chest pain . ED consulted Cardiology , but felt she can admitted to hospitalist service.    Hospital Course:   1. Atypical chest pain: resolved.  She was evaluated for ACS as  She has recurrent chest pain. Cardiac enzymes negative. EKG unchanged.  Pro bnp slightly high. Cardiology consult called and recommended Myoview stress. Her myoview was not significant for ischemic changes.  2d echo showed moderate LVH. LVEF was 45% to 50% and she has  grade 1 diastolic dysfunction. Regional wall abn cannot be excluded. Started her on lisinopril for her decreased EF. We have not discharged her on hyzaar as she is already on lisinopril and hydrochlorothiazide.  Continue with protonix for heartburn attributing to her atypical chest pain 2. Leukocytosis : resolved.  3. Anxiety : on xanax.  4. Hypokalemia : repleted.  5. Abnormal UA: urine cultures show 30,000 colonies.  She is afebrile and no leukocytosis. Pt is asymptomatic. No indication for antibiotics.  6. Slightly elevated creatinine: probably from the lasix she got yesterday. Recommend checking bmp in one week.     Procedures:  myoview yesterday.   Consultations:  cardiology  Discharge Exam: Filed Vitals:   09/25/11 1016 09/25/11 1403 09/25/11 1954 09/26/11 0500  BP: 121/48 124/66 107/63 104/47  Pulse: 88 79 71 66  Temp:  98.1 F (36.7 C) 98.1 F (36.7 C) 98.6 F (37 C)  TempSrc:   Oral Oral  Resp:  18 18 18   Height:      Weight:    78.5 kg (173 lb 1 oz)  SpO2:  95% 95% 93%    General: alert afebrile comfortable Cardiovascular: s1s2, no MRG RRR Respiratory: CTAB, no wheezing or rhonchi Abdomen: soft NT ND BS+ Extremities; no pedal edema.  Discharge Instructions  Discharge Orders    Future Orders Please Complete By Expires   Diet - low sodium heart healthy      Discharge instructions      Comments:   FOLLOW UP WITH pcp in one week  Recommend referral to gastroenterologist for possible EGD, for GERD and heartburn.   Activity as tolerated - No restrictions          Medication List     As of 09/26/2011 12:27 PM    STOP taking these medications         ibuprofen 200 MG tablet   Commonly known as: ADVIL,MOTRIN      TAKE these medications         albuterol 108 (90 BASE) MCG/ACT inhaler   Commonly known as: PROVENTIL HFA;VENTOLIN HFA   Inhale 2 puffs into the lungs every 6 (six) hours as needed. Wheezing or shortness of breath      ALPRAZolam 0.25 MG  tablet   Commonly known as: XANAX   Take 0.25 mg by mouth 3 (three) times daily as needed. For anxiety or sleep      aspirin 81 MG tablet   Take 81 mg by mouth daily.      bisoprolol-hydrochlorothiazide 5-6.25 MG per tablet   Commonly known as: ZIAC   Take 2 tablets by mouth daily.      cholecalciferol 1000 UNITS tablet   Commonly known as: VITAMIN D   Take 1,000 Units by mouth daily.      ezetimibe 10 MG tablet   Commonly known as: ZETIA   Take 10 mg by mouth daily.      ezetimibe 10 MG tablet   Commonly known as: ZETIA   Take 1 tablet (10 mg total) by mouth daily.      fluticasone 50 MCG/ACT nasal spray   Commonly known as: FLONASE   Place 2 sprays into the nose daily as needed. Nasal congestion  Rarely used      guaiFENesin 600 MG 12 hr tablet   Commonly known as: MUCINEX   Take 1,200 mg by mouth 2 (two) times daily as needed. congestion      lisinopril 5 MG tablet   Commonly known as: PRINIVIL,ZESTRIL   Take 1 tablet (5 mg total) by mouth daily.      pantoprazole 40 MG tablet   Commonly known as: PROTONIX   Take 1 tablet (40 mg total) by mouth daily at 12 noon.      polyvinyl alcohol 1.4 % ophthalmic solution   Commonly known as: LIQUIFILM TEARS   Place 1 drop into both eyes 4 (four) times daily as needed. Dry eyes      TYLENOL 500 MG tablet   Generic drug: acetaminophen   Take 500 mg by mouth every 6 (six) hours as needed. pain          The results of significant diagnostics from this hospitalization (including imaging, microbiology, ancillary and laboratory) are listed below for reference.    Significant Diagnostic Studies: Dg Chest 2 View  09/23/2011  *RADIOLOGY REPORT*  Clinical Data: Chest pain and shortness of breath  CHEST - 2 VIEW  Comparison: 09/26/2010  Findings: The cardiac silhouette is normal in size and configuration.  There are no mediastinal or hilar masses or adenopathy.  Mild stable scarring noted in the right upper lobe adjacent to the  minor fissure and right middle lobe.  The lungs are otherwise clear.  No pleural effusion or pneumothorax.  Bony thorax is demineralized but intact.  IMPRESSION: No active disease of the chest.   Original Report Authenticated By: Domenic Moras, M.D.    Nm Myocar Multi W/spect W/wall Motion / Ef  09/25/2011  Clinical Data:  Chest pain.  Technique:  Standard myocardial SPECT imaging performed after resting intravenous injection of Tc-43m tetrofosmin.  Subsequently, stress in the form of Lexiscan  was administered under the supervision of the Cardiology staff.  At peak stress, Tc-58m tetrofosmin was injected intravenously and standard myocardial SPECT imaging performed.  Quantitative gated imaging also performed to evaluate left ventricular wall motion and estimate left ventricular ejection fraction.  Radiopharmaceutical: Tc-4m tetrofosmin, 10 mCi at rest and 30 mCi at stress.  Comparison:  09/23/2011  MYOCARDIAL IMAGING WITH SPECT (REST AND STRESS)  Findings:  Perfusion defect of cardiac apex and adjacent septum noted, most compatible with scar.  No inducible ischemia observed.  LEFT VENTRICULAR EJECTION FRACTION  Findings:  Left ventricular end-diastolic volume is 87 cc.  End- systolic volume is 38 cc.  Derived LV ejection fraction is 56%.  GATED LEFT VENTRICULAR WALL MOTION STUDY  Findings:  There is poor wall motion and poor wall thickening of the cardiac apex, favoring scar.  IMPRESSION:  1.  Scarring at the cardiac apex and adjacent septum.  No inducible ischemia. 2.  Left ventricular ejection fraction calculated at 56%.   Original Report Authenticated By: Dellia Cloud, M.D.     Microbiology: Recent Results (from the past 240 hour(s))  URINE CULTURE     Status: Normal   Collection Time   09/24/11  1:50 AM      Component Value Range Status Comment   Specimen Description URINE, RANDOM   Final    Special Requests [facility] ADD [id number]   Final    Culture  Setup Time 09/24/2011 09:48   Final     Colony Count 30,000 COLONIES/ML   Final    Culture     Final    Value: Multiple bacterial morphotypes present, none predominant. Suggest appropriate recollection if clinically indicated.   Report Status 09/26/2011 FINAL   Final      Labs: Basic Metabolic Panel:  Lab 09/26/11 9147 09/25/11 0515 09/24/11 0545 09/23/11 1307 09/23/11 0834  NA 133* 132* 135 137 136  K 4.9 3.3* 3.7 4.1 4.0  CL 97 97 101 101 103  CO2 27 26 25 24  --  GLUCOSE 109* 111* 114* 156* 124*  BUN 23 17 19 18 17   CREATININE 1.14* 0.91 0.91 0.97 1.00  CALCIUM 9.9 9.6 9.6 10.1 --  MG -- -- -- -- --  PHOS -- -- -- -- --   Liver Function Tests:  Lab 09/24/11 0545  AST 14  ALT 10  ALKPHOS 67  BILITOT 0.4  PROT 5.9*  ALBUMIN 3.0*   No results found for this basename: LIPASE:5,AMYLASE:5 in the last 168 hours No results found for this basename: AMMONIA:5 in the last 168 hours CBC:  Lab 09/24/11 0545 09/23/11 1307 09/23/11 0834 09/23/11 0811  WBC 8.5 15.1* -- 16.7*  NEUTROABS -- -- -- 14.3*  HGB 12.2 13.1 13.9 13.6  HCT 36.3 37.8 41.0 39.2  MCV 86.4 86.7 -- 86.0  PLT 157 183 -- 161   Cardiac Enzymes:  Lab 09/24/11 0545 09/23/11 2106 09/23/11 1301  CKTOTAL 24 26 33  CKMB 1.6 1.5 1.7  CKMBINDEX -- -- --  TROPONINI <0.30 <0.30 <0.30   BNP: BNP (last 3 results)  Basename 09/23/11 1307  PROBNP 835.5*   CBG: No results found for this basename: GLUCAP:5 in the last 168 hours  Time coordinating discharge: 45 minutes  Signed:  Kawon Willcutt  [facility] 09/26/2011, 12:27 PM

## 2011-09-26 NOTE — Progress Notes (Signed)
Patient ID: Wanda Davenport, female   DOB: 12/31/1923, 78 y.o.   MRN: [id number]       Cardiology Progress Note Patient Name: Wanda Davenport Date of Encounter: 09/26/2011, 7:59 AM     Subjective   NO chest pain    Objective   Telemetry: Unable to assess as pt seen in nuc med  Medications: . aspirin EC  325 mg Oral Daily  . bisoprolol-hydrochlorothiazide  2 tablet Oral Daily  . enoxaparin (LOVENOX) injection  40 mg Subcutaneous Q24H  . ezetimibe  10 mg Oral Daily  . furosemide  20 mg Oral Daily  . nitroGLYCERIN  1 inch Topical Q6H  . pantoprazole  40 mg Oral Q1200  . potassium chloride  40 mEq Oral BID  . regadenoson  0.4 mg Intravenous Once  . sodium chloride  3 mL Intravenous Q12H      Physical Exam: Temp:  [98.1 F (36.7 C)-98.6 F (37 C)] 98.6 F (37 C) (09/25 0500) Pulse Rate:  [66-90] 66  (09/25 0500) Resp:  [18] 18  (09/25 0500) BP: (104-128)/(46-66) 104/47 mmHg (09/25 0500) SpO2:  [93 %-95 %] 93 % (09/25 0500) Weight:  [173 lb 1 oz (78.5 kg)] 173 lb 1 oz (78.5 kg) (09/25 0500)  General: Overweight elderly white female, in no acute distress.  Head: Normocephalic, atraumatic, sclera non-icteric, no xanthomas, nares are without discharge.  Neck: Supple. Negative for carotid bruits. JVD not elevated.  Lungs:  Fine bibasilar rales. No wheezes or rhonchi. Breathing is unlabored.  Heart: RRR with S1 S2. No murmurs, rubs, or gallops appreciated.  Abdomen: Soft, non-tender, non-distended with normoactive bowel sounds. No rebound/guarding. No obvious abdominal masses.  Msk: Strength and tone appear normal for age.  Extremities: No clubbing or cyanosis. Trace bilat ankle edema. Distal pedal pulses are intact and equal bilaterally.  Neuro: Alert and oriented X 3. Moves all extremities spontaneously.  Psych: Responds to questions appropriately with a normal affect.    Intake/Output Summary (Last 24 hours) at 09/26/11 0759 Last data filed at 09/25/11 2200  Gross per 24 hour   Intake    483 ml  Output      0 ml  Net    483 ml    Labs:  [facility] 09/25/11 0515 09/24/11 0545  NA 132* 135  K 3.3* 3.7  CL 97 101  CO2 26 25  GLUCOSE 111* 114*  BUN 17 19  CREATININE 0.91 0.91  CALCIUM 9.6 9.6   Basename 09/24/11 0545  AST 14  ALT 10  ALKPHOS 67  BILITOT 0.4  PROT 5.9*  ALBUMIN 3.0*   Basename 09/24/11 0545 09/23/11 1307 09/23/11 0811  WBC 8.5 15.1* --  NEUTROABS -- -- 14.3*  HGB 12.2 13.1 --  HCT 36.3 37.8 --  MCV 86.4 86.7 --  PLT 157 183 --   Basename 09/24/11 0545 09/23/11 2106 09/23/11 1301  CKTOTAL 24 26 33  CKMB 1.6 1.5 1.7  TROPONINI <0.30 <0.30 <0.30   Basename 09/23/11 1307  HGBA1C 6.0*   Basename 09/25/11 0515  CHOL 220*  HDL 41  LDLCALC 137*  TRIG 210*  CHOLHDL 5.4   Basename 09/23/11 1307  TSH 1.088    09/23/2011 13:07   Pro B Natriuretic peptide (BNP)  835.5 (H)      Radiology/Studies:   09/23/2011 - Chest 2 View Findings: The cardiac silhouette is normal in size and configuration.  There are no mediastinal or hilar masses or adenopathy.  Mild stable scarring noted in  the right upper lobe adjacent to the minor fissure and right middle lobe.  The lungs are otherwise clear.  No pleural effusion or pneumothorax.  Bony thorax is demineralized but intact.  IMPRESSION: No active disease of the chest.      09/24/11 - Echo Study Conclusions: - Left ventricle: The cavity size was normal. Wall thickness was increased in a pattern of moderate LVH. Systolic function was mildly reduced. The estimated ejection fraction was in the range of 45% to 50%. Regional wall motion abnormalities cannot be excluded. Doppler parameters are consistent with abnormal left ventricular relaxation (grade 1 diastolic dysfunction). - Ventricular septum: Septal motion showed paradox.    Assessment and Plan  78 y.o. female w/ PMHx significant for HTN, HLD, LBBB and atypical chest pain who presented to [facility] on 09/24/2011 with complaints of  chest pain and shortness of breath.  1. Acute on Chronic Systolic and Diastolic CHF: Echo shows EF 45-50%,  LBBB  BNP 835  D/C with hyzaar 50/12.5    2. Chest pain: She has ruled out for acute MI. EKG shows LBBB. Reviewed myovue  Normal apical thinning no ischemia EF 56% read by radiology as apical scar but I think this is a typical Attenuation from axis of rotation.  3. LBBB: Noted on EKG in 2012, but not 2008.    4. Hypertension: BP stable. Cont antihypertensives   5. Hyperlipidemia: LDL 137 on Zetia. H/o statin intolerance. If found to have coronary disease consider trial of newer statin. LFTs ok.  6. Hypokalemia: K+3.3 this am. Supplement. BMET in am.   Should be ok to D/C home today   Signed, Charlton Haws PA-C  I have seen, examined the patient, and reviewed the above assessment and plan.  Changes to above are made where necessary.  Awaiting results of myoview.  IF low risk, would plan medical therapy.  If high risk, she may need further CV evaluation.  Will add low dose lisinopril for reduce EF.  This may help with hypokalemia also.  Co Sign: Charlton Haws, MD 09/26/2011 7:59 AM

## 2012-04-17 LAB — HEPATIC FUNCTION PANEL
ALT: 9 U/L (ref 7–35)
AST: 15 U/L (ref 13–35)
Alkaline Phosphatase: 81 U/L (ref 25–125)
Bilirubin, Total: 0.4 mg/dL

## 2012-04-17 LAB — LIPID PANEL
Cholesterol: 298 mg/dL — AB (ref 0–200)
HDL: 53 mg/dL (ref 35–70)
LDL CALC: 205 mg/dL
Triglycerides: 201 mg/dL — AB (ref 40–160)

## 2012-04-17 LAB — BASIC METABOLIC PANEL
BUN: 21 mg/dL (ref 4–21)
Creatinine: 0.9 mg/dL (ref <=1.1)
GLUCOSE: 91 mg/dL
Potassium: 4.1 mmol/L (ref 3.4–5.3)
SODIUM: 139 mmol/L (ref 137–147)

## 2012-04-22 ENCOUNTER — Other Ambulatory Visit: Payer: Self-pay | Admitting: *Deleted

## 2012-04-22 DIAGNOSIS — R609 Edema, unspecified: Secondary | ICD-10-CM

## 2012-04-22 DIAGNOSIS — I10 Essential (primary) hypertension: Secondary | ICD-10-CM

## 2012-04-22 MED ORDER — BISOPROLOL-HYDROCHLOROTHIAZIDE 5-6.25 MG PO TABS: 2.0000 | ORAL_TABLET | Freq: Every day | ORAL | Status: DC

## 2012-06-16 ENCOUNTER — Ambulatory Visit: Payer: Self-pay | Admitting: Family Medicine

## 2012-06-22 ENCOUNTER — Other Ambulatory Visit: Payer: Self-pay | Admitting: Internal Medicine

## 2012-07-28 ENCOUNTER — Other Ambulatory Visit: Payer: Self-pay | Admitting: Internal Medicine

## 2012-08-29 ENCOUNTER — Other Ambulatory Visit: Payer: Self-pay | Admitting: Internal Medicine

## 2012-09-25 ENCOUNTER — Other Ambulatory Visit: Payer: Self-pay | Admitting: Internal Medicine

## 2012-10-28 ENCOUNTER — Other Ambulatory Visit: Payer: Self-pay | Admitting: Internal Medicine

## 2012-10-28 NOTE — Telephone Encounter (Signed)
I called patient to discuss problem - she states that her PCP is the one who prescribed medication and Dr. Graciela Husbands only increased dosage amount. I called her pharmacy and routed them to PCP for refill of medication. I also discussed Lexi myoview recommendation back in April 2013 and she stated that she isn't going to do the test, she doesn't need it. I will relay information to Dr. Graciela Husbands.

## 2012-10-29 ENCOUNTER — Other Ambulatory Visit: Payer: Self-pay | Admitting: Internal Medicine

## 2012-11-03 LAB — CBC AND DIFFERENTIAL
HEMATOCRIT: 42 % (ref 36–46)
HEMOGLOBIN: 14.2 g/dL (ref 12.0–16.0)
Neutrophils Absolute: 8 /uL
Platelets: 249 10*3/uL (ref 150–399)
WBC: 10.9 10*3/mL

## 2012-11-03 LAB — BASIC METABOLIC PANEL
BUN: 21 mg/dL (ref 4–21)
CREATININE: 0.9 mg/dL (ref <=1.1)
Glucose: 95 mg/dL
POTASSIUM: 4.5 mmol/L (ref 3.4–5.3)
Sodium: 140 mmol/L (ref 137–147)

## 2012-11-03 LAB — HEPATIC FUNCTION PANEL
ALT: 12 U/L (ref 7–35)
AST: 13 U/L (ref 13–35)
Alkaline Phosphatase: 88 U/L (ref 25–125)
Bilirubin, Total: 0.5 mg/dL

## 2012-11-03 LAB — LIPID PANEL
CHOLESTEROL: 289 mg/dL — AB (ref 0–200)
HDL: 51 mg/dL (ref 35–70)
LDL Cholesterol: 201 mg/dL
Triglycerides: 184 mg/dL — AB (ref 40–160)

## 2012-11-03 LAB — HEMOGLOBIN A1C: HEMOGLOBIN A1C: 6.1

## 2013-05-06 LAB — BASIC METABOLIC PANEL
BUN: 20 mg/dL (ref 4–21)
Creatinine: 0.9 mg/dL (ref <=1.1)
GLUCOSE: 91 mg/dL
POTASSIUM: 4.2 mmol/L (ref 3.4–5.3)
Sodium: 139 mmol/L (ref 137–147)

## 2013-05-06 LAB — HEMOGLOBIN A1C: Hemoglobin A1C: 6

## 2013-05-06 LAB — HEPATIC FUNCTION PANEL
ALT: 11 U/L (ref 7–35)
AST: 18 U/L (ref 13–35)
Alkaline Phosphatase: 81 U/L (ref 25–125)
Bilirubin, Total: 0.6 mg/dL

## 2013-05-06 LAB — LIPID PANEL
Cholesterol: 282 mg/dL — AB (ref 0–200)
HDL: 49 mg/dL (ref 35–70)
LDL Cholesterol: 198 mg/dL
Triglycerides: 175 mg/dL — AB (ref 40–160)

## 2013-12-07 ENCOUNTER — Ambulatory Visit: Payer: Self-pay | Admitting: Family Medicine

## 2014-01-21 DIAGNOSIS — J209 Acute bronchitis, unspecified: Secondary | ICD-10-CM | POA: Diagnosis not present

## 2014-04-19 DIAGNOSIS — L905 Scar conditions and fibrosis of skin: Secondary | ICD-10-CM | POA: Diagnosis not present

## 2014-04-19 DIAGNOSIS — J309 Allergic rhinitis, unspecified: Secondary | ICD-10-CM | POA: Diagnosis not present

## 2014-04-19 DIAGNOSIS — H9319 Tinnitus, unspecified ear: Secondary | ICD-10-CM | POA: Diagnosis not present

## 2014-04-27 DIAGNOSIS — H2512 Age-related nuclear cataract, left eye: Secondary | ICD-10-CM | POA: Diagnosis not present

## 2014-04-30 DIAGNOSIS — H2512 Age-related nuclear cataract, left eye: Secondary | ICD-10-CM | POA: Diagnosis not present

## 2014-05-05 ENCOUNTER — Encounter: Payer: Self-pay | Admitting: *Deleted

## 2014-05-12 ENCOUNTER — Ambulatory Visit: Payer: Commercial Managed Care - HMO | Admitting: Anesthesiology

## 2014-05-12 ENCOUNTER — Encounter: Payer: Self-pay | Admitting: Anesthesiology

## 2014-05-12 ENCOUNTER — Ambulatory Visit
Admission: RE | Admit: 2014-05-12 | Discharge: 2014-05-12 | Disposition: A | Discharge status: Home / Self Care | Payer: Commercial Managed Care - HMO | Source: Ambulatory Visit | Attending: Ophthalmology | Admitting: Ophthalmology

## 2014-05-12 ENCOUNTER — Encounter
Admission: RE | Disposition: A | Discharge status: Home / Self Care | Payer: Self-pay | Source: Ambulatory Visit | Attending: Ophthalmology

## 2014-05-12 DIAGNOSIS — H2512 Age-related nuclear cataract, left eye: Secondary | ICD-10-CM | POA: Insufficient documentation

## 2014-05-12 DIAGNOSIS — N289 Disorder of kidney and ureter, unspecified: Secondary | ICD-10-CM | POA: Diagnosis not present

## 2014-05-12 DIAGNOSIS — Z90710 Acquired absence of both cervix and uterus: Secondary | ICD-10-CM | POA: Insufficient documentation

## 2014-05-12 DIAGNOSIS — Z87891 Personal history of nicotine dependence: Secondary | ICD-10-CM | POA: Diagnosis not present

## 2014-05-12 DIAGNOSIS — Z79899 Other long term (current) drug therapy: Secondary | ICD-10-CM | POA: Insufficient documentation

## 2014-05-12 DIAGNOSIS — F419 Anxiety disorder, unspecified: Secondary | ICD-10-CM | POA: Insufficient documentation

## 2014-05-12 DIAGNOSIS — H9190 Unspecified hearing loss, unspecified ear: Secondary | ICD-10-CM | POA: Diagnosis not present

## 2014-05-12 DIAGNOSIS — M1990 Unspecified osteoarthritis, unspecified site: Secondary | ICD-10-CM | POA: Insufficient documentation

## 2014-05-12 DIAGNOSIS — Z85828 Personal history of other malignant neoplasm of skin: Secondary | ICD-10-CM | POA: Diagnosis not present

## 2014-05-12 DIAGNOSIS — I10 Essential (primary) hypertension: Secondary | ICD-10-CM | POA: Insufficient documentation

## 2014-05-12 DIAGNOSIS — Z882 Allergy status to sulfonamides status: Secondary | ICD-10-CM | POA: Insufficient documentation

## 2014-05-12 DIAGNOSIS — R062 Wheezing: Secondary | ICD-10-CM | POA: Insufficient documentation

## 2014-05-12 DIAGNOSIS — M7989 Other specified soft tissue disorders: Secondary | ICD-10-CM | POA: Diagnosis not present

## 2014-05-12 DIAGNOSIS — R0602 Shortness of breath: Secondary | ICD-10-CM | POA: Diagnosis not present

## 2014-05-12 HISTORY — PX: CATARACT EXTRACTION W/PHACO: SHX586

## 2014-05-12 SURGERY — PHACOEMULSIFICATION, CATARACT, WITH IOL INSERTION
Anesthesia: General | Laterality: Left | Wound class: Clean

## 2014-05-12 MED ORDER — FENTANYL CITRATE (PF) 100 MCG/2ML IJ SOLN
INTRAMUSCULAR | Status: DC | PRN
  Administered 2014-05-12: 50 ug via INTRAVENOUS

## 2014-05-12 MED ORDER — BRIMONIDINE TARTRATE 0.2 % OP SOLN
OPHTHALMIC | Status: DC | PRN
  Administered 2014-05-12: 3 [drp] via OPHTHALMIC

## 2014-05-12 MED ORDER — EPINEPHRINE HCL 1 MG/ML IJ SOLN
INTRAMUSCULAR | Status: DC | PRN
  Administered 2014-05-12: 1 mL

## 2014-05-12 MED ORDER — TIMOLOL MALEATE 0.5 % OP SOLN
OPHTHALMIC | Status: DC | PRN
  Administered 2014-05-12: 3 [drp] via OPHTHALMIC

## 2014-05-12 MED ORDER — LIDOCAINE HCL (PF) 4 % IJ SOLN
INTRAMUSCULAR | Status: DC | PRN
  Administered 2014-05-12: 1 mL

## 2014-05-12 MED ORDER — BSS IO SOLN
INTRAOCULAR | Status: DC | PRN
  Administered 2014-05-12: 134 mL via INTRAOCULAR

## 2014-05-12 MED ORDER — CEFUROXIME OPHTHALMIC INJECTION 1 MG/0.1 ML
INJECTION | OPHTHALMIC | Status: DC | PRN
  Administered 2014-05-12: .3 mL via OPHTHALMIC

## 2014-05-12 MED ORDER — BSS IO SOLN
INTRAOCULAR | Status: DC | PRN
  Administered 2014-05-12: 15 mL

## 2014-05-12 MED ORDER — ARMC OPHTHALMIC DILATING GEL
1.0000 | OPHTHALMIC | Status: DC | PRN
Start: 2014-05-12 — End: 2014-05-12
  Administered 2014-05-12 (×2): 1 via OPHTHALMIC

## 2014-05-12 MED ORDER — TETRACAINE HCL 0.5 % OP SOLN
1.0000 [drp] | Freq: Once | OPHTHALMIC | Status: AC
  Administered 2014-05-12: 1 [drp] via OPHTHALMIC

## 2014-05-12 MED ORDER — POVIDONE-IODINE 5 % OP SOLN
1.0000 "application " | Freq: Once | OPHTHALMIC | Status: AC
  Administered 2014-05-12: 1 via OPHTHALMIC

## 2014-05-12 MED ORDER — NA HYALUR & NA CHOND-NA HYALUR 0.4-0.35 ML IO KIT
PACK | INTRAOCULAR | Status: DC | PRN
  Administered 2014-05-12: 1 mL via INTRAOCULAR

## 2014-05-12 MED ORDER — MIDAZOLAM HCL 2 MG/2ML IJ SOLN
INTRAMUSCULAR | Status: DC | PRN
  Administered 2014-05-12: 1.5 mg via INTRAVENOUS

## 2014-05-12 SURGICAL SUPPLY — 27 items
CANNULA ANT/CHMB 27G (MISCELLANEOUS) ×1 IMPLANT
CANNULA ANT/CHMB 27GA (MISCELLANEOUS) ×3 IMPLANT
GLOVE SURG LX 7.5 STRW (GLOVE) ×2
GLOVE SURG LX STRL 7.5 STRW (GLOVE) ×1 IMPLANT
GLOVE SURG TRIUMPH 8.0 PF LTX (GLOVE) ×3 IMPLANT
GOWN STRL REUS W/ TWL LRG LVL3 (GOWN DISPOSABLE) ×2 IMPLANT
GOWN STRL REUS W/TWL LRG LVL3 (GOWN DISPOSABLE) ×6
LENS IOL ACRSF IQ PC 24.0 (Intraocular Lens) IMPLANT
LENS IOL ACRYSOF IQ POST 24.0 (Intraocular Lens) ×3 IMPLANT
MARKER SKIN SURG W/RULER VIO (MISCELLANEOUS) ×3 IMPLANT
NDL FILTER BLUNT 18X1 1/2 (NEEDLE) ×1 IMPLANT
NDL RETROBULBAR .5 NSTRL (NEEDLE) IMPLANT
NEEDLE FILTER BLUNT 18X 1/2SAF (NEEDLE) ×2
NEEDLE FILTER BLUNT 18X1 1/2 (NEEDLE) ×1 IMPLANT
PACK CATARACT BRASINGTON (MISCELLANEOUS) ×3 IMPLANT
PACK EYE AFTER SURG (MISCELLANEOUS) ×3 IMPLANT
PACK OPTHALMIC (MISCELLANEOUS) ×3 IMPLANT
RING MALYGIN 7.0 (MISCELLANEOUS) IMPLANT
SUT ETHILON 10-0 CS-B-6CS-B-6 (SUTURE)
SUT VICRYL  9 0 (SUTURE)
SUT VICRYL 9 0 (SUTURE) IMPLANT
SUTURE EHLN 10-0 CS-B-6CS-B-6 (SUTURE) IMPLANT
SYR 3ML LL SCALE MARK (SYRINGE) ×3 IMPLANT
SYR 5ML LL (SYRINGE) IMPLANT
SYR TB 1ML LUER SLIP (SYRINGE) ×3 IMPLANT
WATER STERILE IRR 500ML POUR (IV SOLUTION) ×3 IMPLANT
WIPE NON LINTING 3.25X3.25 (MISCELLANEOUS) ×3 IMPLANT

## 2014-05-12 NOTE — Anesthesia Preprocedure Evaluation (Addendum)
Anesthesia Evaluation  Patient identified by MRN, date of birth, ID band Patient awake    Reviewed: Allergy & Precautions, H&P , Patient's Chart, lab work & pertinent test results  History of Anesthesia Complications Negative for: history of anesthetic complications  Airway Mallampati: II  TM Distance: >3 FB Neck ROM: full    Dental no notable dental hx.    Pulmonary former smoker,    Pulmonary exam normal       Cardiovascular hypertension, On Medications Normal cardiovascular exam    Neuro/Psych    GI/Hepatic negative GI ROS, Neg liver ROS,   Endo/Other  negative endocrine ROS  Renal/GU negative Renal ROS     Musculoskeletal   Abdominal   Peds  Hematology negative hematology ROS (+)   Anesthesia Other Findings   Reproductive/Obstetrics                            Anesthesia Physical Anesthesia Plan  ASA: II  Anesthesia Plan: General   Post-op Pain Management:    Induction:   Airway Management Planned:   Additional Equipment:   Intra-op Plan:   Post-operative Plan:   Informed Consent: I have reviewed the patients History and Physical, chart, labs and discussed the procedure including the risks, benefits and alternatives for the proposed anesthesia with the patient or authorized representative who has indicated his/her understanding and acceptance.     Plan Discussed with: CRNA  Anesthesia Plan Comments:         Anesthesia Quick Evaluation

## 2014-05-12 NOTE — Anesthesia Postprocedure Evaluation (Signed)
  Anesthesia Post-op Note  Patient: Wanda Davenport  Procedure(s) Performed: Procedure(s): CATARACT EXTRACTION PHACO AND INTRAOCULAR LENS PLACEMENT (IOC) (Left)  Anesthesia type:General  Patient location: PACU  Post pain: Pain level controlled  Post assessment: Post-op Vital signs reviewed, Patient's Cardiovascular Status Stable, Respiratory Function Stable, Patent Airway and No signs of Nausea or vomiting  Post vital signs: Reviewed and stable  Last Vitals:  Filed Vitals:   05/12/14 1053  BP: 135/61  Pulse: 70  Temp: 36.3 C  Resp: 17    Level of consciousness: awake, alert  and patient cooperative  Complications: No apparent anesthesia complications

## 2014-05-12 NOTE — Transfer of Care (Signed)
Immediate Anesthesia Transfer of Care Note  Patient: Wanda Davenport  Procedure(s) Performed: Procedure(s): CATARACT EXTRACTION PHACO AND INTRAOCULAR LENS PLACEMENT (IOC) (Left)  Patient Location: PACU  Anesthesia Type: General  Level of Consciousness: awake, alert  and patient cooperative  Airway and Oxygen Therapy: Patient Spontanous Breathing and Patient connected to supplemental oxygen  Post-op Assessment: Post-op Vital signs reviewed, Patient's Cardiovascular Status Stable, Respiratory Function Stable, Patent Airway and No signs of Nausea or vomiting  Post-op Vital Signs: Reviewed and stable  Complications: No apparent anesthesia complications

## 2014-05-12 NOTE — Op Note (Signed)
OPERATIVE NOTE  Wanda Davenport [id number] 05/12/2014   PREOPERATIVE DIAGNOSIS:  H25.12 CATARACT            Nuclear sclerotic cataract left eye. H25.12   POSTOPERATIVE DIAGNOSIS: H25.12 CATARACT          Nuclear sclerotic cataract left eye.     PROCEDURE:  Phacoemusification with posterior chamber intraocular lens placement of the left eye   LENS:   Implant Name Type Inv. Item Serial No. Manufacturer Lot No. LRB No. Used  [id number] 24.0     [id number] ALCON   Left 1        ULTRASOUND TIME: 24 % of 3 minutes 2 seconds, CDE 44  SURGEON:  Wyonia Hough, MD   ANESTHESIA:  Topical with tetracaine drops and 2% Xylocaine jelly, augmented with 1% preservative-free intracameral lidocaine.   COMPLICATIONS:  None.   DESCRIPTION OF PROCEDURE:  The patient was identified in the holding room and transported to the operating room and placed in the supine position under the operating microscope.  The left eye was identified as the operative eye and it was prepped and draped in the usual sterile ophthalmic fashion.   A 1 millimeter clear-corneal paracentesis was made at the 1:30 position.  0.5 ml of preservative-free 1% lidocaine was injected into the anterior chamber. The anterior chamber was filled with Viscoat viscoelastic.  A 2.4 millimeter keratome was used to make a near-clear corneal incision at the 10:30 position.  .  A curvilinear capsulorrhexis was made with a cystotome and capsulorrhexis forceps.  Balanced salt solution was used to hydrodissect and hydrodelineate the nucleus.   Phacoemulsification was then used in stop and chop fashion to remove the lens nucleus and epinucleus.  The remaining cortex was then removed using the irrigation and aspiration handpiece. Provisc was then placed into the capsular bag to distend it for lens placement.  A 24.0 -diopter lens was then injected into the capsular bag.  The remaining viscoelastic was aspirated.   Wounds were hydrated with balanced  salt solution.  The anterior chamber was inflated to a physiologic pressure with balanced salt solution.  No wound leaks were noted. Cefuroxime 0.1 ml of a 10mg /ml solution was injected into the anterior chamber for a dose of 1 mg of intracameral antibiotic at the completion of the case.   Timolol and Brimonidine drops were applied to the eye.  The patient was taken to the recovery room in stable condition without complications of anesthesia or surgery.  [name] 05/12/2014, 10:50 AM

## 2014-05-12 NOTE — H&P (Signed)
  The History and Physical notes were scanned in.  The patient remains stable and unchanged from the H&P.   Previous H&P reviewed, patient examined, and there are no changes.  Wanda Davenport 05/12/2014 9:55 AM

## 2014-05-12 NOTE — Discharge Instructions (Signed)
Follow-Up Appointment is: May 12 @ 10:45 am   Cataract Surgery Care After Refer to this sheet in the next few weeks. These instructions provide you with information on caring for yourself after your procedure. Your caregiver may also give you more specific instructions. Your treatment has been planned according to current medical practices, but problems sometimes occur. Call your caregiver if you have any problems or questions after your procedure.  HOME CARE INSTRUCTIONS   Avoid strenuous activities as directed by your caregiver.  Ask your caregiver when you can resume driving.  Use eyedrops or other medicines to help healing and control pressure inside your eye as directed by your caregiver.  Only take over-the-counter or prescription medicines for pain, discomfort, or fever as directed by your caregiver.  Do not to touch or rub your eyes.  You may be instructed to use a protective shield during the first few days and nights after surgery. If not, wear sunglasses to protect your eyes. This is to protect the eye from pressure or from being accidentally bumped.  Keep the area around your eye clean and dry. Avoid swimming or allowing water to hit you directly in the face while showering. Keep soap and shampoo out of your eyes.  Do not bend or lift heavy objects. Bending increases pressure in the eye. You can walk, climb stairs, and do light household chores.  Do not put a contact lens into the eye that had surgery until your caregiver says it is okay to do so.  Ask your doctor when you can return to work. This will depend on the kind of work that you do. If you work in a dusty environment, you may be advised to wear protective eyewear for a period of time.  Ask your caregiver when it will be safe to engage in sexual activity.  Continue with your regular eye exams as directed by your caregiver. What to expect:  It is normal to feel itching and mild discomfort for a few days after  cataract surgery. Some fluid discharge is also common, and your eye may be sensitive to light and touch.  After 1 to 2 days, even moderate discomfort should disappear. In most cases, healing will take about 6 weeks.  If you received an intraocular lens (IOL), you may notice that colors are very bright or have a blue tinge. Also, if you have been in bright sunlight, everything may appear reddish for a few hours. If you see these color tinges, it is because your lens is clear and no longer cloudy. Within a few months after receiving an IOL, these extra colors should go away. When you have healed, you will probably need new glasses. SEEK MEDICAL CARE IF:   You have increased bruising around your eye.  You have discomfort not helped by medicine. SEEK IMMEDIATE MEDICAL CARE IF:   You have a fever.  You have a worsening or sudden vision loss.  You have redness, swelling, or increasing pain in the eye.  You have a thick discharge from the eye that had surgery. MAKE SURE YOU:  Understand these instructions.  Will watch your condition.  Will get help right away if you are not doing well or get worse. Document Released: 07/07/2004 Document Revised: 03/12/2011 Document Reviewed: 08/11/2010 [facility] Patient Information 2015 [place], [place]. This information is not intended to replace advice given to you by your health care provider. Make sure you discuss any questions you have with your health care provider.   General  Anesthesia, Care After Refer to this sheet in the next few weeks. These instructions provide you with information on caring for yourself after your procedure. Your health care provider may also give you more specific instructions. Your treatment has been planned according to current medical practices, but problems sometimes occur. Call your health care provider if you have any problems or questions after your procedure. WHAT TO EXPECT AFTER THE PROCEDURE After the procedure, it is  typical to experience:  Sleepiness.  Nausea and vomiting. HOME CARE INSTRUCTIONS  For the first 24 hours after general anesthesia:  Have a responsible person with you.  Do not drive a car. If you are alone, do not take public transportation.  Do not drink alcohol.  Do not take medicine that has not been prescribed by your health care provider.  Do not sign important papers or make important decisions.  You may resume a normal diet and activities as directed by your health care provider.  Change bandages (dressings) as directed.  If you have questions or problems that seem related to general anesthesia, call the hospital and ask for the anesthetist or anesthesiologist on call. SEEK MEDICAL CARE IF:  You have nausea and vomiting that continue the day after anesthesia.  You develop a rash. SEEK IMMEDIATE MEDICAL CARE IF:   You have difficulty breathing.  You have chest pain.  You have any allergic problems. Document Released: 03/26/2000 Document Revised: 12/23/2012 Document Reviewed: 07/03/2012 [facility] Patient Information 2015 [place], [place]. This information is not intended to replace advice given to you by your health care provider. Make sure you discuss any questions you have with your health care provider.

## 2014-05-14 ENCOUNTER — Encounter: Payer: Self-pay | Admitting: Ophthalmology

## 2014-05-18 DIAGNOSIS — Z9181 History of falling: Secondary | ICD-10-CM | POA: Diagnosis not present

## 2014-05-18 DIAGNOSIS — J209 Acute bronchitis, unspecified: Secondary | ICD-10-CM | POA: Diagnosis not present

## 2014-05-18 DIAGNOSIS — Z6829 Body mass index (BMI) 29.0-29.9, adult: Secondary | ICD-10-CM | POA: Diagnosis not present

## 2014-05-20 ENCOUNTER — Encounter: Payer: Self-pay | Admitting: Ophthalmology

## 2014-08-10 DIAGNOSIS — Z139 Encounter for screening, unspecified: Secondary | ICD-10-CM | POA: Diagnosis not present

## 2014-08-10 DIAGNOSIS — Z1389 Encounter for screening for other disorder: Secondary | ICD-10-CM | POA: Diagnosis not present

## 2014-08-10 DIAGNOSIS — Z9181 History of falling: Secondary | ICD-10-CM | POA: Diagnosis not present

## 2014-08-10 DIAGNOSIS — J209 Acute bronchitis, unspecified: Secondary | ICD-10-CM | POA: Diagnosis not present

## 2014-08-10 DIAGNOSIS — Z6830 Body mass index (BMI) 30.0-30.9, adult: Secondary | ICD-10-CM | POA: Diagnosis not present

## 2014-08-23 ENCOUNTER — Encounter: Payer: Self-pay | Admitting: Ophthalmology

## 2014-08-24 ENCOUNTER — Encounter: Payer: Self-pay | Admitting: Ophthalmology

## 2014-11-11 DIAGNOSIS — Z23 Encounter for immunization: Secondary | ICD-10-CM | POA: Diagnosis not present

## 2014-12-10 DIAGNOSIS — Z6830 Body mass index (BMI) 30.0-30.9, adult: Secondary | ICD-10-CM | POA: Diagnosis not present

## 2014-12-10 DIAGNOSIS — E782 Mixed hyperlipidemia: Secondary | ICD-10-CM | POA: Diagnosis not present

## 2014-12-10 DIAGNOSIS — N183 Chronic kidney disease, stage 3 (moderate): Secondary | ICD-10-CM | POA: Diagnosis not present

## 2014-12-10 DIAGNOSIS — L659 Nonscarring hair loss, unspecified: Secondary | ICD-10-CM | POA: Diagnosis not present

## 2014-12-10 DIAGNOSIS — F419 Anxiety disorder, unspecified: Secondary | ICD-10-CM | POA: Diagnosis not present

## 2014-12-10 DIAGNOSIS — I10 Essential (primary) hypertension: Secondary | ICD-10-CM | POA: Diagnosis not present

## 2014-12-10 DIAGNOSIS — L989 Disorder of the skin and subcutaneous tissue, unspecified: Secondary | ICD-10-CM | POA: Diagnosis not present

## 2014-12-10 DIAGNOSIS — R7303 Prediabetes: Secondary | ICD-10-CM | POA: Diagnosis not present

## 2015-01-20 DIAGNOSIS — H04123 Dry eye syndrome of bilateral lacrimal glands: Secondary | ICD-10-CM | POA: Diagnosis not present

## 2015-03-31 DIAGNOSIS — J40 Bronchitis, not specified as acute or chronic: Secondary | ICD-10-CM | POA: Diagnosis not present

## 2015-03-31 DIAGNOSIS — Z6830 Body mass index (BMI) 30.0-30.9, adult: Secondary | ICD-10-CM | POA: Diagnosis not present

## 2015-03-31 DIAGNOSIS — R0602 Shortness of breath: Secondary | ICD-10-CM | POA: Diagnosis not present

## 2015-04-05 ENCOUNTER — Other Ambulatory Visit: Payer: Self-pay | Admitting: Family Medicine

## 2015-04-05 DIAGNOSIS — J40 Bronchitis, not specified as acute or chronic: Secondary | ICD-10-CM

## 2015-04-21 ENCOUNTER — Ambulatory Visit: Payer: Medicare Other | Attending: Family Medicine

## 2015-05-23 DIAGNOSIS — H6691 Otitis media, unspecified, right ear: Secondary | ICD-10-CM | POA: Diagnosis not present

## 2015-05-23 DIAGNOSIS — J069 Acute upper respiratory infection, unspecified: Secondary | ICD-10-CM | POA: Diagnosis not present

## 2015-05-23 DIAGNOSIS — Z6830 Body mass index (BMI) 30.0-30.9, adult: Secondary | ICD-10-CM | POA: Diagnosis not present

## 2015-06-02 DIAGNOSIS — H6121 Impacted cerumen, right ear: Secondary | ICD-10-CM | POA: Diagnosis not present

## 2015-06-02 DIAGNOSIS — H903 Sensorineural hearing loss, bilateral: Secondary | ICD-10-CM | POA: Diagnosis not present

## 2015-06-02 DIAGNOSIS — H6061 Unspecified chronic otitis externa, right ear: Secondary | ICD-10-CM | POA: Diagnosis not present

## 2015-06-13 DIAGNOSIS — H6061 Unspecified chronic otitis externa, right ear: Secondary | ICD-10-CM | POA: Diagnosis not present

## 2015-06-13 DIAGNOSIS — H6121 Impacted cerumen, right ear: Secondary | ICD-10-CM | POA: Diagnosis not present

## 2015-06-13 DIAGNOSIS — J301 Allergic rhinitis due to pollen: Secondary | ICD-10-CM | POA: Diagnosis not present

## 2015-08-12 DIAGNOSIS — Z9181 History of falling: Secondary | ICD-10-CM | POA: Diagnosis not present

## 2015-08-12 DIAGNOSIS — N183 Chronic kidney disease, stage 3 (moderate): Secondary | ICD-10-CM | POA: Diagnosis not present

## 2015-08-12 DIAGNOSIS — Z1389 Encounter for screening for other disorder: Secondary | ICD-10-CM | POA: Diagnosis not present

## 2015-08-12 DIAGNOSIS — Z23 Encounter for immunization: Secondary | ICD-10-CM | POA: Diagnosis not present

## 2015-08-12 DIAGNOSIS — F419 Anxiety disorder, unspecified: Secondary | ICD-10-CM | POA: Diagnosis not present

## 2015-08-12 DIAGNOSIS — E782 Mixed hyperlipidemia: Secondary | ICD-10-CM | POA: Diagnosis not present

## 2015-08-12 DIAGNOSIS — I10 Essential (primary) hypertension: Secondary | ICD-10-CM | POA: Diagnosis not present

## 2015-08-12 DIAGNOSIS — R7303 Prediabetes: Secondary | ICD-10-CM | POA: Diagnosis not present

## 2015-08-12 DIAGNOSIS — E669 Obesity, unspecified: Secondary | ICD-10-CM | POA: Diagnosis not present

## 2015-08-17 DIAGNOSIS — Z139 Encounter for screening, unspecified: Secondary | ICD-10-CM | POA: Diagnosis not present

## 2015-08-17 DIAGNOSIS — L570 Actinic keratosis: Secondary | ICD-10-CM | POA: Diagnosis not present

## 2015-08-17 DIAGNOSIS — D485 Neoplasm of uncertain behavior of skin: Secondary | ICD-10-CM | POA: Diagnosis not present

## 2015-08-17 DIAGNOSIS — R32 Unspecified urinary incontinence: Secondary | ICD-10-CM | POA: Diagnosis not present

## 2015-08-17 DIAGNOSIS — N390 Urinary tract infection, site not specified: Secondary | ICD-10-CM | POA: Diagnosis not present

## 2015-08-17 DIAGNOSIS — Z6830 Body mass index (BMI) 30.0-30.9, adult: Secondary | ICD-10-CM | POA: Diagnosis not present

## 2015-09-01 DIAGNOSIS — J309 Allergic rhinitis, unspecified: Secondary | ICD-10-CM | POA: Diagnosis not present

## 2015-09-01 DIAGNOSIS — Z6831 Body mass index (BMI) 31.0-31.9, adult: Secondary | ICD-10-CM | POA: Diagnosis not present

## 2015-10-13 DIAGNOSIS — F411 Generalized anxiety disorder: Secondary | ICD-10-CM | POA: Insufficient documentation

## 2015-10-31 ENCOUNTER — Encounter: Payer: Self-pay | Admitting: Family Medicine

## 2015-10-31 ENCOUNTER — Ambulatory Visit (INDEPENDENT_AMBULATORY_CARE_PROVIDER_SITE_OTHER): Payer: Commercial Managed Care - HMO | Admitting: Family Medicine

## 2015-10-31 VITALS — BP 153/78 | HR 77 | Ht 64.0 in | Wt 163.5 lb

## 2015-10-31 DIAGNOSIS — I509 Heart failure, unspecified: Secondary | ICD-10-CM

## 2015-10-31 DIAGNOSIS — J4520 Mild intermittent asthma, uncomplicated: Secondary | ICD-10-CM | POA: Diagnosis not present

## 2015-10-31 DIAGNOSIS — R7303 Prediabetes: Secondary | ICD-10-CM | POA: Diagnosis not present

## 2015-10-31 DIAGNOSIS — F419 Anxiety disorder, unspecified: Secondary | ICD-10-CM | POA: Diagnosis not present

## 2015-10-31 DIAGNOSIS — J301 Allergic rhinitis due to pollen: Secondary | ICD-10-CM | POA: Diagnosis not present

## 2015-10-31 DIAGNOSIS — E785 Hyperlipidemia, unspecified: Secondary | ICD-10-CM | POA: Diagnosis not present

## 2015-10-31 DIAGNOSIS — F4323 Adjustment disorder with mixed anxiety and depressed mood: Secondary | ICD-10-CM | POA: Diagnosis not present

## 2015-10-31 DIAGNOSIS — I110 Hypertensive heart disease with heart failure: Secondary | ICD-10-CM

## 2015-10-31 DIAGNOSIS — C44519 Basal cell carcinoma of skin of other part of trunk: Secondary | ICD-10-CM | POA: Diagnosis not present

## 2015-10-31 DIAGNOSIS — E559 Vitamin D deficiency, unspecified: Secondary | ICD-10-CM | POA: Diagnosis not present

## 2015-10-31 DIAGNOSIS — C4491 Basal cell carcinoma of skin, unspecified: Secondary | ICD-10-CM | POA: Insufficient documentation

## 2015-10-31 DIAGNOSIS — J3089 Other allergic rhinitis: Secondary | ICD-10-CM

## 2015-10-31 DIAGNOSIS — Z23 Encounter for immunization: Secondary | ICD-10-CM

## 2015-10-31 DIAGNOSIS — J45909 Unspecified asthma, uncomplicated: Secondary | ICD-10-CM | POA: Insufficient documentation

## 2015-10-31 MED ORDER — ESCITALOPRAM OXALATE 10 MG PO TABS: 10.0000 mg | ORAL_TABLET | Freq: Every day | ORAL | 1 refills | Status: DC

## 2015-10-31 MED ORDER — MONTELUKAST SODIUM 10 MG PO TABS: 10.0000 mg | ORAL_TABLET | Freq: Every day | ORAL | 1 refills | Status: DC

## 2015-10-31 NOTE — Assessment & Plan Note (Addendum)
Please start allegra daily OTC

## 2015-10-31 NOTE — Assessment & Plan Note (Deleted)
9/13:  ECHO-  The cavity size was normal. Wall thickness was increased in a pattern of moderate LVH. Systolic function was mildly reduced. The estimated ejection fraction was in the range of 45% to 50%. Regional wall motion abnormalities cannot be excluded. Doppler parameters are consistent with abnormal left ventricular relaxation (grade 1 diastolic dysfunction).

## 2015-10-31 NOTE — Assessment & Plan Note (Addendum)
[facility] removed in 2015.  - Told pt she will need yrly skin exams.  She prefers to call them for appt.

## 2015-10-31 NOTE — Assessment & Plan Note (Signed)
Unable to tolerate many different statin medications due to myalgias in her legs.

## 2015-10-31 NOTE — Progress Notes (Signed)
New patient office visit note:  Impression and Recommendations:    1. Adjustment disorder with mixed anxiety and depressed mood   2. Mild intermittent reactive airway disease without complication   3. Environmental and seasonal allergies   4. Basal cell carcinoma of skin of other part of trunk   5. Hypertension with CHF   6. Hyperlipidemia, unspecified hyperlipidemia type   7. Anxiety   8. Pre-diabetes   9. Chronic allergic rhinitis due to pollen, unspecified seasonality   10. Vitamin D deficiency   11. Need for prophylactic vaccination and inoculation against influenza    Has not eaten or drank anything in 8hrs!!!  Since 8 am per pt- will obtain fasting bldwrk  Basal cell Phillips removed in 2015.  - Told pt she will need yrly skin exams.  She prefers to call them for appt.    Hyperlipidemia Unable to tolerate many different statin medications due to myalgias in her legs.  Environmental and seasonal allergies Please start allegra daily OTC  RAD (reactive airway disease)- seasonal- Spring & Fall Will try singulair daily during allergy season  Hypertension with CHF ( diastolic dysf grade I)  BP goal I presume to be < 150/90.  Pt last seen by Cards 2013- told to f/up yrly and never did.   -Bisoprolol daily. Tol well, no S-E  - Amb BP monitoring encouraged  Allergic rhinitis flonase daily  Adjustment disorder with mixed anxiety and depressed mood - Counseled patient on pathophysiology of disease and discussed various treatment options, which often includes lifestyle modifications as first line, in addition to discussing the risks and benefits of various medications.    - Decided on Lexapro after discussion.  - Anticipatory guidance given.   - Encouraged to return to clinic or call the office with any further questions or concerns.     Orders Placed This Encounter  Procedures  . Flu vaccine HIGH DOSE PF (Fluzone High dose)    . CBC with Differential/Platelet  . COMPLETE METABOLIC PANEL WITH GFR  . Hemoglobin A1c  . Lipid Panel w/reflex Direct LDL  . T4, free  . TSH  . Vitamin B12  . VITAMIN D 25 Hydroxy (Vit-D Deficiency, Fractures)    New Prescriptions   ESCITALOPRAM (LEXAPRO) 10 MG TABLET    Take 1 tablet (10 mg total) by mouth daily.   MONTELUKAST (SINGULAIR) 10 MG TABLET    Take 1 tablet (10 mg total) by mouth at bedtime. For wheeze during spring and fall    Modified Medications   No medications on file    Discontinued Medications   ACETAMINOPHEN (TYLENOL) 500 MG TABLET    Take 500 mg by mouth every 6 (six) hours as needed. pain   ALPRAZOLAM (XANAX) 0.25 MG TABLET    Take 0.25 mg by mouth 3 (three) times daily as needed. For anxiety or sleep   ASPIRIN 81 MG TABLET    Take 81 mg by mouth daily.   BISOPROLOL-HYDROCHLOROTHIAZIDE (ZIAC) 5-6.25 MG PER TABLET    TAKE 2 TABLETS BY MOUTH EVERY DAY   POLYVINYL ALCOHOL (LIQUIFILM TEARS) 1.4 % OPHTHALMIC SOLUTION    Place 1 drop into both eyes 4 (four) times daily as needed. Dry eyes    Return in about 4 weeks (around 11/28/2015) for f/up Lexapro and singulair AND bldwrk review.  The patient was counseled, risk factors were discussed, anticipatory guidance given.  Gross side effects, risk and benefits, and alternatives  of medications discussed with patient.  Patient is aware that all medications have potential side effects and we are unable to predict every side effect or drug-drug interaction that may occur.  Expresses verbal understanding and consents to current therapy plan and treatment regimen.  Please see AVS handed out to patient at the end of our visit for further patient instructions/ counseling done pertaining to today's office visit.    Note: This document was prepared using Dragon voice recognition software and may include unintentional dictation  errors.  ----------------------------------------------------------------------------------------------------------------------    Subjective:    HPI: Wanda Davenport is a pleasant 82 y.o. female who presents to [place] at [facility] today to review their medical history with me and establish care.   I asked the patient to review their chronic problem list with me to ensure everything was updated and accurate.    Gets wheezing in spring  and fall- occ takes albuterol inhaler; but during allergy season almost 4 times/week or more. Never tried singulair  Pt feels weak and says she "hates getting old."  Doesn't have the energy she used to have.   Doesn't sleep well- for past 10 yrs or so.  Ever since she "got old" her sleep worsened.  "Nervous every day"- for about 48mo or so - ever since daughter's husband died.  "worries about everything" esp her daughter's well being, and worries about things she shouldn't be worried about.   Effecting her life and ability to relax and enjoy life. No SI/ HI   - Only has a 9th grade education  - Widowed- lives alone  Quit smoking 1973    Patient Care Team    Relationship Specialty Notifications Start End  Mellody Dance, DO PCP - General Family Medicine  10/24/15   Christene Slates, MD  Dermatology  10/24/15   Leandrew Koyanagi, MD Referring Physician Ophthalmology  11/06/15   Deboraha Sprang, MD Consulting Physician Cardiology  11/06/15   Gatha Mayer, MD Consulting Physician Gastroenterology  11/06/15    Comment: she used to see Delfin Edis, MD     Wt Readings from Last 3 Encounters:  10/31/15 163 lb 8 oz (74.2 kg)  05/12/14 166 lb (75.3 kg)  09/26/11 173 lb 1 oz (78.5 kg)   BP Readings from Last 3 Encounters:  10/31/15 (!) 153/78  05/12/14 135/61  09/26/11 (!) 104/47   Pulse Readings from Last 3 Encounters:  10/31/15 77  05/12/14 67  05/12/14 70   BMI Readings from Last 3 Encounters:  10/31/15 28.06 kg/m   05/12/14 33.53 kg/m  09/26/11 29.71 kg/m   Lab Results  Component Value Date   HGBA1C 6.0 (H) 10/31/2015   HGBA1C 6.0 (H) 09/23/2011    Patient Active Problem List   Diagnosis Date Noted  . Adjustment disorder with mixed anxiety and depressed mood 11/06/2015    Priority: High  . Hyperlipidemia     Priority: High  . Hypertension with CHF ( diastolic dysf grade I)  [id number]    Priority: High  . Basal cell carcinoma 10/31/2015    Priority: Medium  . Pre-diabetes     Priority: Medium  . Environmental and seasonal allergies 10/31/2015    Priority: Low  . RAD (reactive airway disease)- seasonal- Spring & Fall 10/31/2015    Priority: Low  . Allergic rhinitis     Priority: Low  . Vitamin D deficiency 11/06/2015  . Atypical chest pain 04/16/2011  . Chronic diastolic heart failure ([place]) 04/16/2011  .  LBBB (left bundle branch block)   . Arthritis   . DIVERTICULOSIS, COLON 12/03/2007     Past Medical History:  Diagnosis Date  . Allergic rhinitis   . Anxiety   . Arthritis   . Atypical chest pain   . Chronic diastolic CHF (congestive heart failure) (HCC)    Echo 06/2010 EF 60-65%, no RWMAs, grade 1 diastolic dysfunction, mild LAE, PASP 83mmHg.  Marland Kitchen DIVERTICULOSIS, COLON   . HEMORRHOIDS, INTERNAL   . Hepatic steatosis   . Hyperlipidemia   . HYPERTENSION   . LBBB (left bundle branch block)    a. present on ECG 06/2010  . Osteopenia   . Pre-diabetes    A1c 6.0     Past Surgical History:  Procedure Laterality Date  . ABDOMINAL HYSTERECTOMY    . BREAST SURGERY    . CATARACT EXTRACTION W/PHACO Left 05/12/2014   Procedure: CATARACT EXTRACTION PHACO AND INTRAOCULAR LENS PLACEMENT (IOC);  Surgeon: Leandrew Koyanagi, MD;  Location: [facility];  Service: Ophthalmology;  Laterality: Left;  . COLONOSCOPY     a. 2010  . TONSILLECTOMY       Family History  Problem Relation Age of Onset  . Colon cancer Sister   . Melanoma Sister   . Hyperlipidemia Brother   .  Hypertension Mother   . Lupus Mother     died 6 from surgical complications  . Alzheimer's disease Brother     unknown  . Kidney disease Father     died 63  . Hypertension Father   . Alcohol abuse Father      History  Drug Use No    History  Alcohol Use No    History  Smoking Status  . Former Smoker  . Packs/day: 0.25  . Years: 20.00  . Types: Cigarettes  . Quit date: 01/02/1971  Smokeless Tobacco  . Never Used    Comment: smoked a few cigarettes/day x 20 yrs - quit @ age 74.    Patient's Medications  New Prescriptions   ESCITALOPRAM (LEXAPRO) 10 MG TABLET    Take 1 tablet (10 mg total) by mouth daily.   MONTELUKAST (SINGULAIR) 10 MG TABLET    Take 1 tablet (10 mg total) by mouth at bedtime. For wheeze during spring and fall  Previous Medications   ALBUTEROL (PROVENTIL HFA;VENTOLIN HFA) 108 (90 BASE) MCG/ACT INHALER    Inhale 2 puffs into the lungs every 6 (six) hours as needed. Wheezing or shortness of breath   ASPIRIN 325 MG EC TABLET    Take 325 mg by mouth daily.   BISOPROLOL (ZEBETA) 10 MG TABLET    Take 1 tablet by mouth daily.   CHOLECALCIFEROL (VITAMIN D) 1000 UNITS TABLET    Take 1,000 Units by mouth daily.   FLUTICASONE (FLONASE) 50 MCG/ACT NASAL SPRAY    Place 2 sprays into the nose daily as needed. Nasal congestion Rarely used   IBUPROFEN (ADVIL,MOTRIN) 200 MG TABLET    Take 200 mg by mouth every 6 (six) hours as needed.  Modified Medications   No medications on file  Discontinued Medications   ACETAMINOPHEN (TYLENOL) 500 MG TABLET    Take 500 mg by mouth every 6 (six) hours as needed. pain   ALPRAZOLAM (XANAX) 0.25 MG TABLET    Take 0.25 mg by mouth 3 (three) times daily as needed. For anxiety or sleep   ASPIRIN 81 MG TABLET    Take 81 mg by mouth daily.   BISOPROLOL-HYDROCHLOROTHIAZIDE (ZIAC) 5-6.25 MG PER  TABLET    TAKE 2 TABLETS BY MOUTH EVERY DAY   POLYVINYL ALCOHOL (LIQUIFILM TEARS) 1.4 % OPHTHALMIC SOLUTION    Place 1 drop into both eyes 4 (four)  times daily as needed. Dry eyes    Allergies: Fenofibrate; Statins; and Sulfonamide derivatives  Review of Systems  Constitutional: Negative for chills, diaphoresis, fever, malaise/fatigue and weight loss.  HENT: Positive for hearing loss. Negative for congestion, sore throat and tinnitus.   Eyes: Negative.  Negative for blurred vision, double vision and photophobia.  Respiratory: Positive for cough. Negative for wheezing.   Cardiovascular: Negative.  Negative for chest pain and palpitations.  Gastrointestinal: Negative.  Negative for blood in stool, diarrhea, nausea and vomiting.  Genitourinary: Negative.  Negative for dysuria, frequency and urgency.  Musculoskeletal: Negative.  Negative for joint pain and myalgias.  Skin: Negative.  Negative for itching and rash.  Neurological: Positive for weakness. Negative for dizziness, focal weakness and headaches.  Endo/Heme/Allergies: Positive for environmental allergies. Negative for polydipsia. Does not bruise/bleed easily.  Psychiatric/Behavioral: Positive for depression. Negative for memory loss. The patient is nervous/anxious. The patient does not have insomnia.      Objective:   Blood pressure (!) 153/78, pulse 77, height 5\' 4"  (1.626 m), weight 163 lb 8 oz (74.2 kg). Body mass index is 28.06 kg/m. General: Well Developed, well nourished, and in no acute distress.  Neuro: Alert and oriented x3, extra-ocular muscles intact HEENT: Normocephalic, atraumatic, pupils equal round reactive to light, neck supple Skin: no gross suspicious lesions or rashes  Cardiac: Regular rate and rhythm, no murmurs rubs or gallops.  Respiratory: distant but Essentially clear to auscultation bilaterally. Not using accessory muscles, speaking in full sentences.  Musculoskeletal: Ambulates w/o diff, FROM * 4 ext.  Vasc: less 2 sec cap RF, warm and pink  Psych:  No HI/SI, judgement and insight good, Euthymic mood. Full Affect.

## 2015-10-31 NOTE — Patient Instructions (Addendum)
Pt had her bloodwrk drawn q 53mo at least at her prior PCP's office- Dr Hammerick's office.   Asked her to bring labs in / med records in    -Discussed with patient seasonal allergies and typical preventative strategies- i.e. N95 mask etc  -Recommended nasal rinses\ washes twice a day as well as after any prolonged exposure to outdoor environmental factors.  -If continues to worsen despite preventative strategies, take over-the-counter Allegra daily during allergy seasons.  We can consider Flonase, Rhinocort and the like if symptoms not well controlled with just oral tablet, nasal rinses and preventative strategies    Allergic Rhinitis Allergic rhinitis is when the mucous membranes in the nose respond to allergens. Allergens are particles in the air that cause your body to have an allergic reaction. This causes you to release allergic antibodies. Through a chain of events, these eventually cause you to release histamine into the blood stream. Although meant to protect the body, it is this release of histamine that causes your discomfort, such as frequent sneezing, congestion, and an itchy, runny nose.  CAUSES Seasonal allergic rhinitis (hay fever) is caused by pollen allergens that may come from grasses, trees, and weeds. Year-round allergic rhinitis (perennial allergic rhinitis) is caused by allergens such as house dust mites, pet dander, and mold spores. SYMPTOMS  Nasal stuffiness (congestion).  Itchy, runny nose with sneezing and tearing of the eyes. DIAGNOSIS Your health care provider can help you determine the allergen or allergens that trigger your symptoms. If you and your health care provider are unable to determine the allergen, skin or blood testing may be used. Your health care provider will diagnose your condition after taking your health history and performing a physical exam. Your health care provider may assess you for other related conditions, such as asthma, pink eye, or an ear  infection. TREATMENT Allergic rhinitis does not have a cure, but it can be controlled by:  Medicines that block allergy symptoms. These may include allergy shots, nasal sprays, and oral antihistamines.  Avoiding the allergen. Hay fever may often be treated with antihistamines in pill or nasal spray forms. Antihistamines block the effects of histamine. There are over-the-counter medicines that may help with nasal congestion and swelling around the eyes. Check with your health care provider before taking or giving this medicine. If avoiding the allergen or the medicine prescribed do not work, there are many new medicines your health care provider can prescribe. Stronger medicine may be used if initial measures are ineffective. Desensitizing injections can be used if medicine and avoidance does not work. Desensitization is when a patient is given ongoing shots until the body becomes less sensitive to the allergen. Make sure you follow up with your health care provider if problems continue. HOME CARE INSTRUCTIONS It is not possible to completely avoid allergens, but you can reduce your symptoms by taking steps to limit your exposure to them. It helps to know exactly what you are allergic to so that you can avoid your specific triggers. SEEK MEDICAL CARE IF:  You have a fever.  You develop a cough that does not stop easily (persistent).  You have shortness of breath.  You start wheezing.  Symptoms interfere with normal daily activities.   This information is not intended to replace advice given to you by your health care provider. Make sure you discuss any questions you have with your health care provider.   Document Released: 09/12/2000 Document Revised: 01/08/2014 Document Reviewed: 08/25/2012 Elsevier Interactive Patient Education 2016  [place].     Generalized Anxiety Disorder Generalized anxiety disorder (GAD) is a mental disorder. It interferes with life functions, including  relationships, work, and school. GAD is different from normal anxiety, which everyone experiences at some point in their lives in response to specific life events and activities. Normal anxiety actually helps [place] prepare for and get through these life events and activities. Normal anxiety goes away after the event or activity is over.  GAD causes anxiety that is not necessarily related to specific events or activities. It also causes excess anxiety in proportion to specific events or activities. The anxiety associated with GAD is also difficult to control. GAD can vary from mild to severe. People with severe GAD can have intense waves of anxiety with physical symptoms (panic attacks).  SYMPTOMS The anxiety and worry associated with GAD are difficult to control. This anxiety and worry are related to many life events and activities and also occur more days than not for 6 months or longer. People with GAD also have three or more of the following symptoms (one or more in children):  Restlessness.   Fatigue.  Difficulty concentrating.   Irritability.  Muscle tension.  Difficulty sleeping or unsatisfying sleep. DIAGNOSIS GAD is diagnosed through an assessment by your health care provider. Your health care provider will ask you questions aboutyour mood,physical symptoms, and events in your life. Your health care provider may ask you about your medical history and use of alcohol or drugs, including prescription medicines. Your health care provider may also do a physical exam and blood tests. Certain medical conditions and the use of certain substances can cause symptoms similar to those associated with GAD. Your health care provider may refer you to a mental health specialist for further evaluation. TREATMENT The following therapies are usually used to treat GAD:   Medication. Antidepressant medication usually is prescribed for long-term daily control. Antianxiety medicines may be added in severe  cases, especially when panic attacks occur.   Talk therapy (psychotherapy). Certain types of talk therapy can be helpful in treating GAD by providing support, education, and guidance. A form of talk therapy called cognitive behavioral therapy can teach you healthy ways to think about and react to daily life events and activities.  Stress managementtechniques. These include yoga, meditation, and exercise and can be very helpful when they are practiced regularly. A mental health specialist can help determine which treatment is best for you. Some people see improvement with one therapy. However, other people require a combination of therapies.   This information is not intended to replace advice given to you by your health care provider. Make sure you discuss any questions you have with your health care provider.   Document Released: 04/14/2012 Document Revised: 01/08/2014 Document Reviewed: 04/14/2012 Elsevier Interactive Patient Education [employer].

## 2015-10-31 NOTE — Assessment & Plan Note (Signed)
Will try singulair daily during allergy season

## 2015-11-01 LAB — CBC WITH DIFFERENTIAL/PLATELET
BASOS PCT: 0 %
Basophils Absolute: 0 cells/uL (ref 0–200)
EOS PCT: 2 %
Eosinophils Absolute: 216 cells/uL (ref 15–500)
HEMATOCRIT: 41.1 % (ref 35.0–45.0)
Hemoglobin: 13.4 g/dL (ref 11.7–15.5)
Lymphocytes Relative: 27 %
Lymphs Abs: 2916 cells/uL (ref 850–3900)
MCH: 29.1 pg (ref 27.0–33.0)
MCHC: 32.6 g/dL (ref 32.0–36.0)
MCV: 89.3 fL (ref 80.0–100.0)
MONO ABS: 972 {cells}/uL — AB (ref 200–950)
MPV: 10 fL (ref 7.5–12.5)
Monocytes Relative: 9 %
NEUTROS PCT: 62 %
Neutro Abs: 6696 cells/uL (ref 1500–7800)
Platelets: 275 10*3/uL (ref 140–400)
RBC: 4.6 MIL/uL (ref 3.80–5.10)
RDW: 14.7 % (ref 11.0–15.0)
WBC: 10.8 10*3/uL (ref 3.8–10.8)

## 2015-11-01 LAB — COMPLETE METABOLIC PANEL WITHOUT GFR
ALT: 10 U/L (ref 6–29)
AST: 15 U/L (ref 10–35)
Albumin: 3.7 g/dL (ref 3.6–5.1)
Alkaline Phosphatase: 78 U/L (ref 33–130)
BUN: 33 mg/dL — ABNORMAL HIGH (ref 7–25)
CO2: 20 mmol/L (ref 20–31)
Calcium: 10 mg/dL (ref 8.6–10.4)
Chloride: 106 mmol/L (ref 98–110)
Creat: 1.04 mg/dL — ABNORMAL HIGH (ref 0.60–0.88)
GFR, Est African American: 55 mL/min — ABNORMAL LOW
GFR, Est Non African American: 47 mL/min — ABNORMAL LOW
Glucose, Bld: 98 mg/dL (ref 65–99)
Potassium: 4.9 mmol/L (ref 3.5–5.3)
Sodium: 139 mmol/L (ref 135–146)
Total Bilirubin: 0.4 mg/dL (ref 0.2–1.2)
Total Protein: 6.3 g/dL (ref 6.1–8.1)

## 2015-11-01 LAB — VITAMIN D 25 HYDROXY (VIT D DEFICIENCY, FRACTURES): Vit D, 25-Hydroxy: 44 ng/mL (ref 30–100)

## 2015-11-01 LAB — LIPID PANEL W/REFLEX DIRECT LDL
Cholesterol: 280 mg/dL — ABNORMAL HIGH (ref 125–200)
HDL: 42 mg/dL — ABNORMAL LOW
LDL Cholesterol: 187 mg/dL — ABNORMAL HIGH
Total CHOL/HDL Ratio: 6.7 ratio — ABNORMAL HIGH
Triglycerides: 255 mg/dL — ABNORMAL HIGH

## 2015-11-01 LAB — TSH: TSH: 1.74 m[IU]/L

## 2015-11-01 LAB — VITAMIN B12: Vitamin B-12: 196 pg/mL — ABNORMAL LOW (ref 200–1100)

## 2015-11-01 LAB — HEMOGLOBIN A1C
HEMOGLOBIN A1C: 6 % — AB (ref <5.7)
MEAN PLASMA GLUCOSE: 126 mg/dL

## 2015-11-01 LAB — T4, FREE: Free T4: 1.1 ng/dL (ref 0.8–1.8)

## 2015-11-06 DIAGNOSIS — E559 Vitamin D deficiency, unspecified: Secondary | ICD-10-CM | POA: Insufficient documentation

## 2015-11-06 DIAGNOSIS — F4323 Adjustment disorder with mixed anxiety and depressed mood: Secondary | ICD-10-CM | POA: Insufficient documentation

## 2015-11-06 NOTE — Assessment & Plan Note (Addendum)
BP goal I presume to be < 150/90.  Pt last seen by Cards 2013- told to f/up yrly and never did.   -Bisoprolol daily. Tol well, no S-E  - Amb BP monitoring encouraged

## 2015-11-06 NOTE — Assessment & Plan Note (Signed)
-   Counseled patient on pathophysiology of disease and discussed various treatment options, which often includes lifestyle modifications as first line, in addition to discussing the risks and benefits of various medications.    - Decided on Lexapro after discussion.  - Anticipatory guidance given.   - Encouraged to return to clinic or call the office with any further questions or concerns.

## 2015-11-06 NOTE — Assessment & Plan Note (Signed)
flonase daily

## 2015-11-06 NOTE — Assessment & Plan Note (Deleted)
-   Counseled patient on pathophysiology of disease and discussed various treatment options, which often includes lifestyle modifications as first line, in addition to discussing the risks and benefits of various medications.    - Decided on Lexapro after discussion.  - Anticipatory guidance given.   - Encouraged to return to clinic or call the office with any further questions or concerns.

## 2015-11-07 ENCOUNTER — Telehealth: Payer: Self-pay | Admitting: Family Medicine

## 2015-11-07 NOTE — Telephone Encounter (Signed)
Opened in error

## 2015-12-13 ENCOUNTER — Ambulatory Visit (INDEPENDENT_AMBULATORY_CARE_PROVIDER_SITE_OTHER): Payer: Commercial Managed Care - HMO | Admitting: Family Medicine

## 2015-12-13 ENCOUNTER — Encounter: Payer: Self-pay | Admitting: Family Medicine

## 2015-12-13 VITALS — BP 119/71 | HR 71 | Ht 64.0 in | Wt 165.5 lb

## 2015-12-13 DIAGNOSIS — R748 Abnormal levels of other serum enzymes: Secondary | ICD-10-CM

## 2015-12-13 DIAGNOSIS — I509 Heart failure, unspecified: Secondary | ICD-10-CM

## 2015-12-13 DIAGNOSIS — J4520 Mild intermittent asthma, uncomplicated: Secondary | ICD-10-CM | POA: Diagnosis not present

## 2015-12-13 DIAGNOSIS — E785 Hyperlipidemia, unspecified: Secondary | ICD-10-CM

## 2015-12-13 DIAGNOSIS — M159 Polyosteoarthritis, unspecified: Secondary | ICD-10-CM

## 2015-12-13 DIAGNOSIS — J3089 Other allergic rhinitis: Secondary | ICD-10-CM

## 2015-12-13 DIAGNOSIS — E781 Pure hyperglyceridemia: Secondary | ICD-10-CM | POA: Insufficient documentation

## 2015-12-13 DIAGNOSIS — F4323 Adjustment disorder with mixed anxiety and depressed mood: Secondary | ICD-10-CM

## 2015-12-13 DIAGNOSIS — F411 Generalized anxiety disorder: Secondary | ICD-10-CM | POA: Diagnosis not present

## 2015-12-13 DIAGNOSIS — E559 Vitamin D deficiency, unspecified: Secondary | ICD-10-CM

## 2015-12-13 DIAGNOSIS — I739 Peripheral vascular disease, unspecified: Secondary | ICD-10-CM

## 2015-12-13 DIAGNOSIS — R7303 Prediabetes: Secondary | ICD-10-CM

## 2015-12-13 DIAGNOSIS — E538 Deficiency of other specified B group vitamins: Secondary | ICD-10-CM | POA: Insufficient documentation

## 2015-12-13 DIAGNOSIS — I110 Hypertensive heart disease with heart failure: Secondary | ICD-10-CM

## 2015-12-13 NOTE — Assessment & Plan Note (Signed)
BP goal I presume to be < 150/90.  Pt last seen by Cards 2013- told to f/up yrly and never did. Encouraged again to call for appt for f/up.    -Bisoprolol daily. Tol well, no S-E  - Amb BP monitoring encouraged

## 2015-12-13 NOTE — Assessment & Plan Note (Addendum)
CONT singulair daily during allergy season

## 2015-12-13 NOTE — Assessment & Plan Note (Signed)
Singulair QD  Allegra QD

## 2015-12-13 NOTE — Assessment & Plan Note (Addendum)
A1c- 6 and stable---> no change from prior.    Counseling done  All questions answered  Reck 59mo

## 2015-12-13 NOTE — Assessment & Plan Note (Signed)
L medial jt knee pain- chronic.   Pt would like to wait til the new year to address this.

## 2015-12-13 NOTE — Assessment & Plan Note (Signed)
Continue Lexapro daily. Doing well.

## 2015-12-13 NOTE — Patient Instructions (Signed)
Your Kela Millin is for your mood-for anxiety and nervousness as well as feeling sad. Please continue to take this daily.  Your Singulair\ montelukast is for your reactive airway disease-trouble breathing during the allergy seasons of spring and fall. This medicine you should take daily from August 20 to December 15 and end of February/ early March until early June.  Next office visit please bring in a complete list of all medicines you take on a daily basis and also a list of ones you take just as needed.    Generalized Anxiety Disorder Generalized anxiety disorder (GAD) is a mental disorder. It interferes with life functions, including relationships, work, and school. GAD is different from normal anxiety, which everyone experiences at some point in their lives in response to specific life events and activities. Normal anxiety actually helps [place] prepare for and get through these life events and activities. Normal anxiety goes away after the event or activity is over.  GAD causes anxiety that is not necessarily related to specific events or activities. It also causes excess anxiety in proportion to specific events or activities. The anxiety associated with GAD is also difficult to control. GAD can vary from mild to severe. People with severe GAD can have intense waves of anxiety with physical symptoms (panic attacks).  SYMPTOMS The anxiety and worry associated with GAD are difficult to control. This anxiety and worry are related to many life events and activities and also occur more days than not for 6 months or longer. People with GAD also have three or more of the following symptoms (one or more in children):  Restlessness.   Fatigue.  Difficulty concentrating.   Irritability.  Muscle tension.  Difficulty sleeping or unsatisfying sleep. DIAGNOSIS GAD is diagnosed through an assessment by your health care provider. Your health care provider will ask you questions aboutyour  mood,physical symptoms, and events in your life. Your health care provider may ask you about your medical history and use of alcohol or drugs, including prescription medicines. Your health care provider may also do a physical exam and blood tests. Certain medical conditions and the use of certain substances can cause symptoms similar to those associated with GAD. Your health care provider may refer you to a mental health specialist for further evaluation. TREATMENT The following therapies are usually used to treat GAD:   Medication. Antidepressant medication usually is prescribed for long-term daily control. Antianxiety medicines may be added in severe cases, especially when panic attacks occur.   Talk therapy (psychotherapy). Certain types of talk therapy can be helpful in treating GAD by providing support, education, and guidance. A form of talk therapy called cognitive behavioral therapy can teach you healthy ways to think about and react to daily life events and activities.  Stress managementtechniques. These include yoga, meditation, and exercise and can be very helpful when they are practiced regularly. A mental health specialist can help determine which treatment is best for you. Some people see improvement with one therapy. However, other people require a combination of therapies. This information is not intended to replace advice given to you by your health care provider. Make sure you discuss any questions you have with your health care provider. Document Released: 04/14/2012 Document Revised: 01/08/2014 Document Reviewed: 04/14/2012 Elsevier Interactive Patient Education  2017 [employer].

## 2015-12-13 NOTE — Assessment & Plan Note (Signed)
Unable to tolerate many different statin medications due to myalgias in her legs.  - Counseling done re: ABN labs.   - Dietary changes such as low saturated & trans fat and low carb/ ketogenic diets discussed with patient.   - Encouraged regular exercise and weight loss  Educational handouts provided at patient's desire.  All questions answered

## 2015-12-13 NOTE — Assessment & Plan Note (Signed)
Cont lexapro  Counseling done  All questions answered

## 2015-12-13 NOTE — Progress Notes (Signed)
Impression and Recommendations:    1. GAD (generalized anxiety disorder)   2. Environmental and seasonal allergies   3. Adjustment disorder with mixed anxiety and depressed mood   4. Mild intermittent reactive airway disease without complication   5. Pre-diabetes   6. Hypertriglyceridemia   7. Low serum HDL   8. Hyperlipidemia, unspecified hyperlipidemia type   9. Vitamin D deficiency   10. B12 deficiency   11. Hypertension with CHF ( diastolic dysf grade I)    12. Claudication of left lower extremity ([place])   13. Generalized OA     Adjustment disorder with mixed anxiety and depressed mood Continue Lexapro daily. Doing well.  Reactive airway disease- only spring and fall due to allergies CONT singulair daily during allergy season  Pre-diabetes A1c- 6 and stable---> no change from prior.    Counseling done  All questions answered  Reck 30mo  GAD (generalized anxiety disorder) Cont lexapro  Counseling done  All questions answered  Hyperlipidemia Unable to tolerate many different statin medications due to myalgias in her legs.  - Counseling done re: ABN labs.   - Dietary changes such as low saturated & trans fat and low carb/ ketogenic diets discussed with patient.   - Encouraged regular exercise and weight loss  Educational handouts provided at patient's desire.  All questions answered  Vitamin D deficiency cont supp.  Environmental and seasonal allergies Singulair QD  Allegra QD  B12 deficiency Daily B-complex vitamin due to B-12 196.   Reck 6-49mo  Hypertension with CHF ( diastolic dysf grade I)  BP goal I presume to be < 150/90.  Pt last seen by Cards 2013- told to f/up yrly and never did. Encouraged again to call for appt for f/up.    -Bisoprolol daily. Tol well, no S-E  - Amb BP monitoring encouraged  Generalized OA L medial jt knee pain- chronic.   Pt would like to wait til the new year to address this.    Claudication of left  lower extremity (HCC) Pt declines w/up and eval at this time.  - in future, if dev W sx--> f/up for eval sooner than planned.  - Counseling done re: condition/ dx px etc  - red flag sx of redness, swelling etc which pt has none of now.   All questions answered  Handouts given  Hypertriglyceridemia Dietary changes such as low carb/ ketogenic diets discussed with patient.    Encouraged regular exercise and weight loss.   Educational handouts provided at patient's desire.  Pt was in the office today for 40+ minutes, with over 50% time spent in face to face counseling of various medical concerns and in coordination of care  New Prescriptions   No medications on file    Modified Medications   No medications on file    Discontinued Medications   CHOLECALCIFEROL (VITAMIN D) 1000 UNITS TABLET    Take 1,000 Units by mouth daily.    The patient was counseled, risk factors were discussed, anticipatory guidance given.  Gross side effects, risk and benefits, and alternatives of medications and treatment plan in general discussed with patient.  Patient is aware that all medications have potential side effects and we are unable to predict every side effect or drug-drug interaction that may occur.   Patient will call with any questions prior to using medication if they have concerns.  Expresses verbal understanding and consents to current therapy and treatment regimen.  No barriers to understanding were identified.  Red flag symptoms and signs discussed in detail.  Patient expressed understanding regarding what to do in case of emergency\urgent symptoms  Return in about 3 months (around 03/12/2016) for Follow up sooner if you like to discuss your left lower extremity pain or arthritis knee pain.  Please see AVS handed out to patient at the end of our visit for further patient instructions/ counseling done pertaining to today's office visit.    Note: This document was prepared using Dragon  voice recognition software and may include unintentional dictation errors.   --------------------------------------------------------------------------------------------------------------------------------------------------------------------------------------------------------------------------------------------    Subjective:    CC:  Chief Complaint  Patient presents with  . Follow-up  . Anxiety    HPI: Wanda Davenport is a 82 y.o. female who presents to [place] at [facility] today for issues as discussed below.   Started pt on lexapro--- for anxiety last OV.  Has helped her a lot.  Worrying a lot less.    Happy with results.   Started pt on Singulair for RAD in fall/ spring last OV.    Now, only using albuterol 1 or twice wkly.  Per pt - used to use it once daily or so.     Patient complains of claudication symptoms in her left lower extremity. It occurs after a certain period of time of her walking around the grocery store or [place] etc.. It feels like a burning sensation in her lower extremity. Feels like it "gets hot", and starts to ache and throb. The symptoms are relieved with rest. This is been going on for many weeks, not acutely worse. Patient wishes to deal with this after Christmas.   - She tells me she will come in to discuss this with me as well as left medial joint knee pain which has been ongoing as well.  Wants to wait til after christmas.   Recent blood work that we ordered was reviewed with patient today.  Patient was counseled on all abnormalities and we discussed dietary and lifestyle changes that could help those values (also medications when appropriate).  Extensive health counseling performed and all patient's concerns/ questions were addressed.  - vit D, CBC - N.  CMP- stable.   A1c- stable at 6.0, FLP--W than prior- reck yrly    Wt Readings from Last 3 Encounters:  12/13/15 165 lb 8 oz (75.1 kg)  10/31/15 163 lb 8 oz (74.2 kg)  05/12/14  166 lb (75.3 kg)   BP Readings from Last 3 Encounters:  12/13/15 119/71  10/31/15 (!) 153/78  05/12/14 135/61   Pulse Readings from Last 3 Encounters:  12/13/15 71  10/31/15 77  05/12/14 67   BMI Readings from Last 3 Encounters:  12/13/15 28.41 kg/m  10/31/15 28.06 kg/m  05/12/14 33.53 kg/m     Patient Care Team    Relationship Specialty Notifications Start End  Mellody Dance, DO PCP - General Family Medicine  10/24/15   Christene Slates, MD  Dermatology  10/24/15   Leandrew Koyanagi, MD Referring Physician Ophthalmology  11/06/15   Deboraha Sprang, MD Consulting Physician Cardiology  11/06/15   Gatha Mayer, MD Consulting Physician Gastroenterology  11/06/15    Comment: she used to see Delfin Edis, MD    Patient Active Problem List   Diagnosis Date Noted  . Hypertriglyceridemia 12/13/2015    Priority: High  . Low serum HDL 12/13/2015    Priority: High  . Adjustment disorder with mixed anxiety and depressed mood 11/06/2015  Priority: High  . Chronic diastolic heart failure ([place]) 04/16/2011    Priority: High  . Hyperlipidemia     Priority: High  . Pre-diabetes     Priority: High  . Hypertension with CHF ( diastolic dysf grade I)  [id number]    Priority: High  . Claudication of left lower extremity ([place]) 12/13/2015    Priority: Medium  . Basal cell carcinoma 10/31/2015    Priority: Medium  . GAD (generalized anxiety disorder) 10/13/2015    Priority: Medium  . B12 deficiency 12/13/2015    Priority: Low  . Vitamin D deficiency 11/06/2015    Priority: Low  . Environmental and seasonal allergies 10/31/2015    Priority: Low  . Reactive airway disease- only spring and fall due to allergies 10/31/2015    Priority: Low  . Generalized OA     Priority: Low  . Allergic rhinitis     Priority: Low  . Atypical chest pain 04/16/2011  . LBBB (left bundle branch block)   . DIVERTICULOSIS, COLON 12/03/2007    Past Medical history, Surgical history, Family  history, Social history, Allergies and Medications have been entered into the medical record, reviewed and changed as needed.   Allergies:  Allergies  Allergen Reactions  . Fenofibrate Other (See Comments)    Myalgias/gi upset   . Statins Other (See Comments)    Myalgias   . Sulfonamide Derivatives Nausea And Vomiting    REACTION: nausea    Review of Systems  Constitutional: Negative for chills, diaphoresis and fever.  HENT: Negative for nosebleeds.   Eyes: Negative for blurred vision and double vision.  Respiratory: Negative for shortness of breath and wheezing.   Cardiovascular: Positive for claudication. Negative for chest pain, palpitations, orthopnea, leg swelling and PND.  Gastrointestinal: Negative for diarrhea, nausea and vomiting.  Musculoskeletal: Positive for back pain, joint pain and myalgias. Negative for falls.       C/o knee pain; gen OA jt pains  Skin: Negative for rash.  Neurological: Negative for focal weakness.  Endo/Heme/Allergies: Negative for polydipsia.  Psychiatric/Behavioral: Positive for depression. Negative for suicidal ideas. The patient is nervous/anxious. The patient does not have insomnia.        Sx- controlled on meds     Objective:   Blood pressure 119/71, pulse 71, height 5\' 4"  (1.626 m), weight 165 lb 8 oz (75.1 kg). Body mass index is 28.41 kg/m. General: Well Developed, well nourished, appropriate for stated age.  Neuro: Alert and oriented x3, extra-ocular muscles intact, sensation grossly intact.  HEENT: Normocephalic, atraumatic, neck supple   Skin: Warm and dry, no gross rash. Cardiac: RRR, S1 S2 Respiratory: distant but ECTA B/L, Not using accessory muscles, speaking in full sentences-unlabored. M-Sk: no inc swelling L vs R  knee.  FROM w/o crepitus, no instability Vascular:  mild lower ext edema b/l, mild venous varicosities b/l- mild;  no redness/ TTP/ swelling on L>erR;  cap RF less 2 sec. Psych: No HI/SI, judgement and insight  good, Euthymic mood. Full Affect.

## 2015-12-13 NOTE — Assessment & Plan Note (Signed)
cont supp 

## 2015-12-13 NOTE — Assessment & Plan Note (Addendum)
Pt declines w/up and eval at this time.  - in future, if dev W sx--> f/up for eval sooner than planned.  - Counseling done re: condition/ dx px etc  - red flag sx of redness, swelling etc which pt has none of now.   All questions answered  Handouts given

## 2015-12-13 NOTE — Assessment & Plan Note (Signed)
Dietary changes such as low carb/ ketogenic diets discussed with patient.    Encouraged regular exercise and weight loss.   Educational handouts provided at patient's desire.

## 2015-12-13 NOTE — Assessment & Plan Note (Addendum)
Daily B-complex vitamin due to B-12 196.   Reck 6-44mo

## 2015-12-21 ENCOUNTER — Other Ambulatory Visit: Payer: Self-pay | Admitting: Family Medicine

## 2015-12-21 NOTE — Telephone Encounter (Signed)
Patient is requesting that all of her meds go through [facility] from now on. She also is requesting a refill of her bisoprolol

## 2015-12-21 NOTE — Telephone Encounter (Signed)
Will need to get this med from her cards doc.

## 2015-12-21 NOTE — Telephone Encounter (Signed)
We have not prescribed this medication for this pt.  Please authorize refill if appropriate.  Charyl Bigger, CMA

## 2015-12-21 NOTE — Addendum Note (Signed)
Addended by: Fonnie Mu on: 12/21/2015 10:41 AM   Modules accepted: Orders

## 2015-12-22 NOTE — Telephone Encounter (Signed)
Pt states she does not have a Cardiologist. Bobetta Lime CMA, RT

## 2015-12-23 ENCOUNTER — Telehealth: Payer: Self-pay

## 2015-12-23 NOTE — Telephone Encounter (Signed)
Informed pt to call previous pharmacy to see if they can transfer her blood pressure medication refills to [facility] per Dr. Raliegh Scarlet. Pt understood and was agreeable. Bobetta Lime CMA, RT

## 2015-12-23 NOTE — Telephone Encounter (Signed)
Pt called wanting a refill on her blood pressure medications. Dr. Raliegh Scarlet wanted her to get refill through her Cardiologist. Pt stated she does not have a Cardiologist. Pt stated she had 3 refills remaining of her blood pressure medication from previous Doctor. However, pt wants to get a new prescription at [facility] due to it being closer. Pt stated she is going to pick refill that was sent in from previous Doctor. Informed pt if she had any problems or concerns to contact us. Pt was agreeable and understood. Bobetta Lime CMA, RT

## 2015-12-23 NOTE — Telephone Encounter (Signed)
Spoke with amanda this am- she told me pt has 3 RF's on her bottle already- not used yet, thus does not need RF's at this time.

## 2016-01-13 ENCOUNTER — Telehealth: Payer: Self-pay | Admitting: Family Medicine

## 2016-01-13 DIAGNOSIS — J209 Acute bronchitis, unspecified: Secondary | ICD-10-CM | POA: Diagnosis not present

## 2016-01-13 NOTE — Telephone Encounter (Signed)
Patients daughter called and said she is having difficulties breathing and general weakness and wants to be seen

## 2016-01-13 NOTE — Telephone Encounter (Signed)
Spoke with daughter and advised to take her to urgent care. She was very upset that we could not he her.

## 2016-01-14 DIAGNOSIS — J40 Bronchitis, not specified as acute or chronic: Secondary | ICD-10-CM

## 2016-01-14 HISTORY — DX: Bronchitis, not specified as acute or chronic: J40

## 2016-01-25 ENCOUNTER — Ambulatory Visit (INDEPENDENT_AMBULATORY_CARE_PROVIDER_SITE_OTHER): Payer: Medicare HMO | Admitting: Family Medicine

## 2016-01-25 ENCOUNTER — Encounter: Payer: Self-pay | Admitting: Family Medicine

## 2016-01-25 VITALS — BP 145/67 | HR 68 | Temp 97.9°F | Ht 64.0 in | Wt 164.8 lb

## 2016-01-25 DIAGNOSIS — E538 Deficiency of other specified B group vitamins: Secondary | ICD-10-CM

## 2016-01-25 DIAGNOSIS — F4323 Adjustment disorder with mixed anxiety and depressed mood: Secondary | ICD-10-CM | POA: Diagnosis not present

## 2016-01-25 DIAGNOSIS — I739 Peripheral vascular disease, unspecified: Secondary | ICD-10-CM

## 2016-01-25 DIAGNOSIS — I110 Hypertensive heart disease with heart failure: Secondary | ICD-10-CM | POA: Diagnosis not present

## 2016-01-25 DIAGNOSIS — M159 Polyosteoarthritis, unspecified: Secondary | ICD-10-CM

## 2016-01-25 DIAGNOSIS — E781 Pure hyperglyceridemia: Secondary | ICD-10-CM

## 2016-01-25 DIAGNOSIS — Z5181 Encounter for therapeutic drug level monitoring: Secondary | ICD-10-CM | POA: Diagnosis not present

## 2016-01-25 DIAGNOSIS — E559 Vitamin D deficiency, unspecified: Secondary | ICD-10-CM

## 2016-01-25 DIAGNOSIS — Z789 Other specified health status: Secondary | ICD-10-CM

## 2016-01-25 DIAGNOSIS — I5032 Chronic diastolic (congestive) heart failure: Secondary | ICD-10-CM | POA: Diagnosis not present

## 2016-01-25 DIAGNOSIS — F411 Generalized anxiety disorder: Secondary | ICD-10-CM

## 2016-01-25 DIAGNOSIS — I509 Heart failure, unspecified: Secondary | ICD-10-CM

## 2016-01-25 DIAGNOSIS — Z7189 Other specified counseling: Secondary | ICD-10-CM

## 2016-01-25 DIAGNOSIS — E785 Hyperlipidemia, unspecified: Secondary | ICD-10-CM

## 2016-01-25 MED ORDER — RED YEAST RICE 600 MG PO TABS: 4.0000 | ORAL_TABLET | Freq: Every day | ORAL | 0 refills | Status: DC

## 2016-01-25 MED ORDER — CALCIUM CARBONATE-VITAMIN D 600-400 MG-UNIT PO TABS: 2.0000 | ORAL_TABLET | Freq: Every day | ORAL | 11 refills | Status: DC

## 2016-01-25 MED ORDER — VITAMIN D 50 MCG (2000 UT) PO CAPS: 1.0000 | ORAL_CAPSULE | ORAL | Status: AC

## 2016-01-25 NOTE — Patient Instructions (Addendum)
So the once a day women's for women mulivitamin one daily would be enough to cover your B12 needs.   Heart Failure Heart failure is a condition in which the heart has trouble pumping blood because it has become weak or stiff. This means that the heart does not pump blood efficiently for the body to work well. For some people with heart failure, fluid may back up into the lungs and there may be swelling (edema) in the lower legs. Heart failure is usually a long-term (chronic) condition. It is important for you to take good care of yourself and follow the treatment plan from your health care provider. What are the causes? This condition is caused by some health problems, including:  High blood pressure (hypertension). Hypertension causes the heart muscle to work harder than normal. High blood pressure eventually causes the heart to become stiff and weak.  Coronary artery disease (CAD). CAD is the buildup of cholesterol and fat (plaques) in the arteries of the heart.  Heart attack (myocardial infarction). Injured tissue, which is caused by the heart attack, does not contract as well and the heart's ability to pump blood is weakened.  Abnormal heart valves. When the heart valves do not open and close properly, the heart muscle must pump harder to keep the blood flowing.  Heart muscle disease (cardiomyopathy or myocarditis). Heart muscle disease is damage to the heart muscle from a variety of causes, such as drug or alcohol abuse, infections, or unknown causes. These can increase the risk of heart failure.  Lung disease. When the lungs do not work properly, the heart must work harder. What increases the risk? Risk of heart failure increases as a person ages. This condition is also more likely to develop in people who:  Are overweight.  Are female.  Smoke or chew tobacco.  Abuse alcohol or illegal drugs.  Have taken medicines that can damage the heart, such as chemotherapy drugs.  Have  diabetes.  High blood sugar (glucose) is associated with high fat (lipid) levels in the blood.  Diabetes can also damage tiny blood vessels that carry nutrients to the heart muscle.  Have abnormal heart rhythms.  Have thyroid problems.  Have low blood counts (anemia). What are the signs or symptoms? Symptoms of this condition include:  Shortness of breath with activity, such as when climbing stairs.  Persistent cough.  Swelling of the feet, ankles, legs, or abdomen.  Unexplained weight gain.  Difficulty breathing when lying flat (orthopnea).  Waking from sleep because of the need to sit up and get more air.  Rapid heartbeat.  Fatigue and loss of energy.  Feeling light-headed, dizzy, or close to fainting.  Loss of appetite.  Nausea.  Increased urination during the night (nocturia).  Confusion. How is this diagnosed? This condition is diagnosed based on:  Medical history, symptoms, and a physical exam.  Diagnostic tests, which may include:  Echocardiogram.  Electrocardiogram (ECG).  Chest X-ray.  Blood tests.  Exercise stress test.  Radionuclide scans.  Cardiac catheterization and angiogram. How is this treated? Treatment for this condition is aimed at managing the symptoms of heart failure. Medicines, behavioral changes, or other treatments may be necessary to treat heart failure. Medicines  These may include:  Angiotensin-converting enzyme (ACE) inhibitors. This type of medicine blocks the effects of a blood protein called angiotensin-converting enzyme. ACE inhibitors relax (dilate) the blood vessels and help to lower blood pressure.  Angiotensin receptor blockers (ARBs). This type of medicine blocks the actions of  a blood protein called angiotensin. ARBs dilate the blood vessels and help to lower blood pressure.  Water pills (diuretics). Diuretics cause the kidneys to remove salt and water from the blood. The extra fluid is removed through  urination, leaving a lower volume of blood that the heart has to pump.  Beta blockers. These improve heart muscle strength and they prevent the heart from beating too quickly.  Digoxin. This increases the force of the heartbeat. Healthy behavior changes  These may include:  Reaching and maintaining a healthy weight.  Stopping smoking or chewing tobacco.  Eating heart-healthy foods.  Limiting or avoiding alcohol.  Stopping use of street drugs (illegal drugs).  Physical activity. Other treatments  These may include:  Surgery to open blocked coronary arteries or repair damaged heart valves.  Placement of a biventricular pacemaker to improve heart muscle function (cardiac resynchronization therapy). This device paces both the right ventricle and left ventricle.  Placement of a device to treat serious abnormal heart rhythms (implantable cardioverter defibrillator, or ICD).  Placement of a device to improve the pumping ability of the heart (left ventricular assist device, or LVAD).  Heart transplant. This can cure heart failure, and it is considered for certain patients who do not improve with other therapies. Follow these instructions at home: Medicines  Take over-the-counter and prescription medicines only as told by your health care provider. Medicines are important in reducing the workload of your heart, slowing the progression of heart failure, and improving your symptoms.  Do not stop taking your medicine unless your health care provider told you to do that.  Do not skip any dose of medicine.  Refill your prescriptions before you run out of medicine. You need your medicines every day. Eating and drinking  Eat heart-healthy foods. Talk with a dietitian to make an eating plan that is right for you.  Choose foods that contain no trans fat and are low in saturated fat and cholesterol. Healthy choices include fresh or frozen fruits and vegetables, fish, lean meats, legumes,  fat-free or low-fat dairy products, and whole-grain or high-fiber foods.  Limit salt (sodium) if directed by your health care provider. Sodium restriction may reduce symptoms of heart failure. Ask a dietitian to recommend heart-healthy seasonings.  Use healthy cooking methods instead of frying. Healthy methods include roasting, grilling, broiling, baking, poaching, steaming, and stir-frying.  Limit your fluid intake if directed by your health care provider. Fluid restriction may reduce symptoms of heart failure. Lifestyle  Stop smoking or using chewing tobacco. Nicotine and tobacco can damage your heart and your blood vessels. Do not use nicotine gum or patches before talking to your health care provider.  Limit alcohol intake to no more than 1 drink per day for non-pregnant women and 2 drinks per day for men. One drink equals 12 oz of beer, 5 oz of wine, or 1 oz of hard liquor.  Drinking more than that is harmful to your heart. Tell your health care provider if you drink alcohol several times a week.  Talk with your health care provider about whether any level of alcohol use is safe for you.  If your heart has already been damaged by alcohol or you have severe heart failure, drinking alcohol should be stopped completely.  Stop use of illegal drugs.  Lose weight if directed by your health care provider. Weight loss may reduce symptoms of heart failure.  Do moderate physical activity if directed by your health care provider. People who are elderly and  people with severe heart failure should consult with a health care provider for physical activity recommendations. Monitor important information  Weigh yourself every day. Keeping track of your weight daily helps you to notice excess fluid sooner.  Weigh yourself every morning after you urinate and before you eat breakfast.  Wear the same amount of clothing each time you weigh yourself.  Record your daily weight. Provide your health care  provider with your weight record.  Monitor and record your blood pressure as told by your health care provider.  Check your pulse as told by your health care provider. Dealing with extreme temperatures  If the weather is extremely hot:  Avoid vigorous physical activity.  Use air conditioning or fans or seek a cooler location.  Avoid caffeine and alcohol.  Wear loose-fitting, lightweight, and light-colored clothing.  If the weather is extremely cold:  Avoid vigorous physical activity.  Layer your clothes.  Wear mittens or gloves, a hat, and a scarf when you go outside.  Avoid alcohol. General instructions  Manage other health conditions such as hypertension, diabetes, thyroid disease, or abnormal heart rhythms as told by your health care provider.  Learn to manage stress. If you need help to do this, ask your health care provider.  Plan rest periods when fatigued.  Get ongoing education and support as needed.  Participate in or seek rehabilitation as needed to maintain or improve independence and quality of life.  Stay up to date with immunizations. Keeping current on pneumococcal and influenza immunizations is especially important to prevent respiratory infections.  Keep all follow-up visits as told by your health care provider. This is important. Contact a health care provider if:  You have a rapid weight gain.  You have increasing shortness of breath that is unusual for you.  You are unable to participate in your usual physical activities.  You tire easily.  You cough more than normal, especially with physical activity.  You have any swelling or more swelling in areas such as your hands, feet, ankles, or abdomen.  You are unable to sleep because it is hard to breathe.  You feel like your heart is beating quickly (palpitations).  You become dizzy or light-headed when you stand up. Get help right away if:  You have difficulty breathing.  You notice or  your family notices a change in your awareness, such as having trouble staying awake or having difficulty with concentration.  You have pain or discomfort in your chest.  You have an episode of fainting (syncope). This information is not intended to replace advice given to you by your health care provider. Make sure you discuss any questions you have with your health care provider. Document Released: 12/18/2004 Document Revised: 08/23/2015 Document Reviewed: 07/13/2015 Elsevier Interactive Patient Education  2017 [employer].

## 2016-01-25 NOTE — Progress Notes (Signed)
Impression and Recommendations:    1. Medication monitoring encounter   2. Adjustment disorder with mixed anxiety and depressed mood   3. Chronic diastolic heart failure ([place])   4. Hypertension with CHF ( diastolic dysf grade I)    5. Intolerance of drug- statins   6. Claudication of left lower extremity ([place])   7. Hyperlipidemia, unspecified hyperlipidemia type   8. Hypertriglyceridemia   9. GAD (generalized anxiety disorder)   10. Generalized OA   11. B12 deficiency   12. Vitamin D deficiency   13. Counseling regarding end of life decision making      Medication monitoring encounter Spent at least 15 separate from OV looking through ALL of pt's OTC meds as daughter rbought in a big bag of them for me to go thru. - Counseling done regarding what to take and why and also wrote on top of bottles with sharpie how to take each.  Advised to get med box to help keep things stright and also have Daughter or Son put all meds in container at start of each week.   All questions answered   Counseling regarding end of life decision making Also d/c pt and daughter re: pt still living at home alone and wants it that way.  Won't move from her home.  Wont move in with son or daughter.   Pt not struggling with ADL's.,  Cares for self.  Get-up-and-Go test is exceptionally good today in office.  Good prox muslce strength and stability in legs.  But will order quad cane- message sent to CMA to call in script for this.  I rec son or daughter visit daily to check in with pt though.  Adjustment disorder with mixed anxiety and depressed mood Doing well on lexapro for her depr/ anxeity.  tol well.   Avoid xanax, benadryl etc.    "Beer's list" r/w daughter and pt.   Chronic diastolic heart failure (HCC) Seen Dr Caryl Comes in the past.  Family desires pt to be evaluated by Cards again for a "check up".   Hyperlipidemia Intol to statins;   Doubtful change in therapy needed but will seek direction from  Cards as well   Hypertension with CHF ( diastolic dysf grade I)  Meds to optimized with Cards input.  GAD (generalized anxiety disorder) Cont lexapro  Vitamin D deficiency cont supp.  Doses r/w daughter and take additional 2,000 IU   Plus ca-vit D supp.    See doses in med list   B12 deficiency Recommend B12 supp.  Advised to get a female MVI which has the vit B12 in it.   Counseling done   All questions answered   The patient was counseled, risk factors were discussed, anticipatory guidance given.   New Prescriptions   CALCIUM CARBONATE-VITAMIN D 600-400 MG-UNIT TABLET    Take 2 tablets by mouth daily.   CHOLECALCIFEROL (VITAMIN D) 2000 UNITS CAPS    Take 1 capsule (2,000 Units total) by mouth 1 day or 1 dose.   RED YEAST RICE 600 MG TABS    Take 4 tablets (2,400 mg total) by mouth at bedtime.     Meds ordered this encounter  Medications  . benzonatate (TESSALON) 200 MG capsule    Sig: Take 1 capsule by mouth 3 (three) times daily as needed.  . ALPRAZolam (XANAX) 0.25 MG tablet    Sig: Take 0.25 mg by mouth daily as needed for anxiety.  . Red Yeast Rice 600 MG  TABS    Sig: Take 4 tablets (2,400 mg total) by mouth at bedtime.    Dispense:  90 tablet    Refill:  0  . Calcium Carbonate-Vitamin D 600-400 MG-UNIT tablet    Sig: Take 2 tablets by mouth daily.    Dispense:  60 tablet    Refill:  11  . Cholecalciferol (VITAMIN D) 2000 units CAPS    Sig: Take 1 capsule (2,000 Units total) by mouth 1 day or 1 dose.    Dispense:  30 capsule     Discontinued Medications   IBUPROFEN (ADVIL,MOTRIN) 200 MG TABLET    Take 200 mg by mouth every 6 (six) hours as needed.     Orders Placed This Encounter  Procedures  . Cane adjustable wide base quad  . Ambulatory referral to Cardiology     Gross side effects, risk and benefits, and alternatives of medications and treatment plan in general discussed with patient.  Patient is aware that all medications have potential side  effects and we are unable to predict every side effect or drug-drug interaction that may occur.   Patient will call with any questions prior to using medication if they have concerns.  Expresses verbal understanding and consents to current therapy and treatment regimen.  No barriers to understanding were identified.  Red flag symptoms and signs discussed in detail.  Patient expressed understanding regarding what to do in case of emergency\urgent symptoms  Please see AVS handed out to patient at the end of our visit for further patient instructions/ counseling done pertaining to today's office visit.   Return in about 3 months (around 04/24/2016) for f/up BP, chronic issues.     Note: This document was prepared using Dragon voice recognition software and may include unintentional dictation errors.   --------------------------------------------------------------------------------------------------------------------------------------------------------------------------------------------------------------------------------------------    Subjective:    CC:  Chief Complaint  Patient presents with  . Follow-up    ER visit 01/14/16 diagnosed with bronchitis    HPI: Wanda Davenport is a 83 y.o. female who presents to [place] [facility] today for issues as discussed below.   Pt seen at [facility] in [place] on 01/13/16 and diagnosed with bronchitis and is here for follow-up office visit from that.   patient was taking Tessalon Perles and Levaquin and prednisone.   Patient is dong much better. No SOB, Wh, Cough etc.  Feels she is over it.    She brings her daughter in with her tody. This is the one who recently lost her husand and patient worried about excessively. They have many questions about her medications today and wntd me go hrough her over the-counter medicines that sh has at homeand see what she needs t take and does not need to take.    patient also has concerns  about feelinga little more fatigued than usual and said she doesn't have the stamina she used to hae when she was vacuuming her house.  She now has to stop 3 ties or 4 times during the process.      she has not seen a cardiologist since 2013 and the daughter asked if we can refer her for further evaluaton from a heart doc/ for check up with a cardiologist.   She has no orthopnea,  paroxysal nocturnal dyspnea,  She lies flat to sleep every night   She's not noticed any increase In her weight or swelling in her legs etc.   She has "fatigue" and doesn't like growing  old.    Wt Readings from Last 3 Encounters:  01/25/16 164 lb 12.8 oz (74.8 kg)  12/13/15 165 lb 8 oz (75.1 kg)  10/31/15 163 lb 8 oz (74.2 kg)   BP Readings from Last 3 Encounters:  01/25/16 (!) 145/67  12/13/15 119/71  10/31/15 (!) 153/78   Pulse Readings from Last 3 Encounters:  01/25/16 68  12/13/15 71  10/31/15 77   BMI Readings from Last 3 Encounters:  01/25/16 28.29 kg/m  12/13/15 28.41 kg/m  10/31/15 28.06 kg/m     Patient Care Team    Relationship Specialty Notifications Start End  Mellody Dance, DO PCP - General Family Medicine  10/24/15   Christene Slates, MD  Dermatology  10/24/15   Leandrew Koyanagi, MD Referring Physician Ophthalmology  11/06/15   Deboraha Sprang, MD Consulting Physician Cardiology  11/06/15   Gatha Mayer, MD Consulting Physician Gastroenterology  11/06/15    Comment: she used to see Delfin Edis, MD     Patient Active Problem List   Diagnosis Date Noted  . Medication monitoring encounter 01/26/2016    Priority: High  . Intolerance of drug- statins 01/25/2016    Priority: High  . Hypertriglyceridemia 12/13/2015    Priority: High  . Low serum HDL 12/13/2015    Priority: High  . Adjustment disorder with mixed anxiety and depressed mood 11/06/2015    Priority: High  . Chronic diastolic heart failure ([place]) 04/16/2011    Priority: High  . Hyperlipidemia     Priority: High  .  Pre-diabetes     Priority: High  . Hypertension with CHF ( diastolic dysf grade I)  [id number]    Priority: High  . Claudication of left lower extremity ([place]) 12/13/2015    Priority: Medium  . Basal cell carcinoma 10/31/2015    Priority: Medium  . GAD (generalized anxiety disorder) 10/13/2015    Priority: Medium  . chronic LBBB (left bundle branch block)- since 2012     Priority: Medium  . B12 deficiency 12/13/2015    Priority: Low  . Vitamin D deficiency 11/06/2015    Priority: Low  . Environmental and seasonal allergies 10/31/2015    Priority: Low  . Reactive airway disease- only spring and fall due to allergies 10/31/2015    Priority: Low  . Generalized OA     Priority: Low  . Allergic rhinitis     Priority: Low  . Counseling regarding end of life decision making 01/25/2016  . Atypical chest pain 04/16/2011  . DIVERTICULOSIS, COLON 12/03/2007    Past Medical history, Surgical history, Family history, Social history, Allergies and Medications have been entered into the medical record, reviewed and changed as needed.    Current Meds  Medication Sig  . albuterol (PROVENTIL HFA;VENTOLIN HFA) 108 (90 BASE) MCG/ACT inhaler Inhale 2 puffs into the lungs every 6 (six) hours as needed. Wheezing or shortness of breath  . ALPRAZolam (XANAX) 0.25 MG tablet Take 0.25 mg by mouth daily as needed for anxiety.  Marland Kitchen aspirin 325 MG EC tablet Take 325 mg by mouth daily.  . benzonatate (TESSALON) 200 MG capsule Take 1 capsule by mouth 3 (three) times daily as needed.  . bisoprolol (ZEBETA) 10 MG tablet Take 1 tablet by mouth daily.  Marland Kitchen escitalopram (LEXAPRO) 10 MG tablet Take 1 tablet (10 mg total) by mouth daily.  . fluticasone (FLONASE) 50 MCG/ACT nasal spray Place 2 sprays into the nose daily as needed. Nasal congestion Rarely used  . montelukast (SINGULAIR)  10 MG tablet Take 1 tablet (10 mg total) by mouth at bedtime. For wheeze during spring and fall  . [DISCONTINUED] ibuprofen  (ADVIL,MOTRIN) 200 MG tablet Take 200 mg by mouth every 6 (six) hours as needed.    Allergies:  Allergies  Allergen Reactions  . Fenofibrate Other (See Comments)    Myalgias/gi upset   . Statins Other (See Comments)    Myalgias   . Sulfonamide Derivatives Nausea And Vomiting    REACTION: nausea     Review of Systems: General:   Denies fever, chills, unexplained weight loss.  Optho/Auditory:   Denies visual changes, blurred vision/LOV Respiratory:   Denies wheeze, DOE more than baseline levels.  Cardiovascular:   Denies chest pain, palpitations, new onset peripheral edema  Gastrointestinal:   Denies nausea, vomiting, diarrhea, abd pain.  Genitourinary: Denies dysuria, freq/ urgency, flank pain or discharge from genitals.  Endocrine:     Denies hot or cold intolerance, polyuria, polydipsia. Musculoskeletal:   Denies unexplained myalgias, joint swelling, unexplained arthralgias, gait problems.  Skin:  Denies new onset rash, suspicious lesions Neurological:     Denies dizziness, unexplained weakness, numbness  Psychiatric/Behavioral:   Denies mood changes, suicidal or homicidal ideations, hallucinations   Objective:   Blood pressure (!) 145/67, pulse 68, temperature 97.9 F (36.6 C), temperature source Oral, height 5\' 4"  (1.626 m), weight 164 lb 12.8 oz (74.8 kg). Body mass index is 28.29 kg/m. General:  Well Developed, well nourished, appropriate for stated age.  Neuro:  Alert and oriented,  extra-ocular muscles intact  HEENT:  Normocephalic, atraumatic, neck supple, no carotid bruits appreciated  Skin:  no gross rash, warm, pink. Cardiac:  RRR, S1 S2 Respiratory:  ECTA B/L and A/P-  No crackles wheezes or rales, Good aeration, Not using accessory muscles, speaking in full sentences- unlabored. Vascular:  Ext warm, no cyanosis apprec.; cap RF less 2 sec. Mild peripheral edema, non-pit currently Psych:  No HI/SI, judgement and insight good, Euthymic mood. Full Affect.

## 2016-01-26 ENCOUNTER — Telehealth: Payer: Self-pay | Admitting: Family Medicine

## 2016-01-26 DIAGNOSIS — Z5181 Encounter for therapeutic drug level monitoring: Secondary | ICD-10-CM | POA: Insufficient documentation

## 2016-01-26 NOTE — Assessment & Plan Note (Addendum)
Also d/c pt and daughter re: pt still living at home alone and wants it that way.  Won't move from her home.  Wont move in with son or daughter.   Pt not struggling with ADL's.,  Cares for self.  Get-up-and-Go test is exceptionally good today in office.  Good prox muslce strength and stability in legs.  But will order quad cane- message sent to CMA to call in script for this.  I rec son or daughter visit daily to check in with pt though.

## 2016-01-26 NOTE — Assessment & Plan Note (Signed)
Meds to optimized with Cards input.

## 2016-01-26 NOTE — Assessment & Plan Note (Signed)
Cont lexapro

## 2016-01-26 NOTE — Assessment & Plan Note (Signed)
Doing well on lexapro for her depr/ anxeity.  tol well.   Avoid xanax, benadryl etc.    "Beer's list" r/w daughter and pt.

## 2016-01-26 NOTE — Assessment & Plan Note (Signed)
Seen Dr Caryl Comes in the past.  Family desires pt to be evaluated by Cards again for a "check up".

## 2016-01-26 NOTE — Assessment & Plan Note (Signed)
cont supp.  Doses r/w daughter and take additional 2,000 IU   Plus ca-vit D supp.    See doses in med list

## 2016-01-26 NOTE — Assessment & Plan Note (Signed)
Intol to statins;   Doubtful change in therapy needed but will seek direction from Cards as well

## 2016-01-26 NOTE — Assessment & Plan Note (Signed)
Recommend B12 supp.  Advised to get a female MVI which has the vit B12 in it.   Counseling done   All questions answered

## 2016-01-26 NOTE — Assessment & Plan Note (Addendum)
Spent at least 15 separate from OV looking through ALL of pt's OTC meds as daughter rbought in a big bag of them for me to go thru. - Counseling done regarding what to take and why and also wrote on top of bottles with sharpie how to take each.  Advised to get med box to help keep things stright and also have Daughter or Son put all meds in container at start of each week.   All questions answered

## 2016-01-27 ENCOUNTER — Ambulatory Visit: Payer: Medicare Other | Admitting: Internal Medicine

## 2016-02-03 DIAGNOSIS — I447 Left bundle-branch block, unspecified: Secondary | ICD-10-CM | POA: Diagnosis not present

## 2016-02-03 DIAGNOSIS — I10 Essential (primary) hypertension: Secondary | ICD-10-CM | POA: Diagnosis not present

## 2016-02-03 DIAGNOSIS — E782 Mixed hyperlipidemia: Secondary | ICD-10-CM | POA: Diagnosis not present

## 2016-02-03 DIAGNOSIS — R0602 Shortness of breath: Secondary | ICD-10-CM | POA: Diagnosis not present

## 2016-02-03 DIAGNOSIS — I5032 Chronic diastolic (congestive) heart failure: Secondary | ICD-10-CM | POA: Diagnosis not present

## 2016-02-03 DIAGNOSIS — I4891 Unspecified atrial fibrillation: Secondary | ICD-10-CM | POA: Diagnosis not present

## 2016-02-10 DIAGNOSIS — R0602 Shortness of breath: Secondary | ICD-10-CM | POA: Diagnosis not present

## 2016-02-10 DIAGNOSIS — I4891 Unspecified atrial fibrillation: Secondary | ICD-10-CM | POA: Diagnosis not present

## 2016-02-22 DIAGNOSIS — R0602 Shortness of breath: Secondary | ICD-10-CM | POA: Diagnosis not present

## 2016-02-24 ENCOUNTER — Telehealth: Payer: Self-pay | Admitting: Family Medicine

## 2016-02-24 MED ORDER — BISOPROLOL FUMARATE 10 MG PO TABS: 10.0000 mg | ORAL_TABLET | Freq: Every day | ORAL | 0 refills | Status: DC

## 2016-02-24 NOTE — Telephone Encounter (Signed)
Patient is requesting a refill of her BP meds and was told she would need an appt to get any additional refills. I made her an appt for Tuesday but she states she only has 2 days worth left.

## 2016-02-24 NOTE — Addendum Note (Signed)
Addended by: Fonnie Mu on: 02/24/2016 03:06 PM   Modules accepted: Orders

## 2016-02-24 NOTE — Telephone Encounter (Signed)
Pt informed that RX for 30 day supply was sent to pharmacy.  Pt has appt 02/27/16.  Charyl Bigger, CMA

## 2016-02-27 DIAGNOSIS — I5022 Chronic systolic (congestive) heart failure: Secondary | ICD-10-CM | POA: Diagnosis not present

## 2016-02-27 DIAGNOSIS — I10 Essential (primary) hypertension: Secondary | ICD-10-CM | POA: Diagnosis not present

## 2016-02-27 DIAGNOSIS — I447 Left bundle-branch block, unspecified: Secondary | ICD-10-CM | POA: Diagnosis not present

## 2016-02-28 ENCOUNTER — Ambulatory Visit (INDEPENDENT_AMBULATORY_CARE_PROVIDER_SITE_OTHER): Payer: Medicare HMO | Admitting: Adult Health

## 2016-02-28 ENCOUNTER — Encounter: Payer: Self-pay | Admitting: Adult Health

## 2016-02-28 VITALS — BP 128/77 | HR 91 | Ht 64.0 in | Wt 163.5 lb

## 2016-02-28 DIAGNOSIS — Z5181 Encounter for therapeutic drug level monitoring: Secondary | ICD-10-CM

## 2016-02-28 DIAGNOSIS — I447 Left bundle-branch block, unspecified: Secondary | ICD-10-CM | POA: Diagnosis not present

## 2016-02-28 DIAGNOSIS — I509 Heart failure, unspecified: Secondary | ICD-10-CM

## 2016-02-28 DIAGNOSIS — I110 Hypertensive heart disease with heart failure: Secondary | ICD-10-CM | POA: Diagnosis not present

## 2016-02-28 NOTE — Assessment & Plan Note (Signed)
From most recent Cardiologist visits: Holter Monitor detected "Baseline normal sinus rhythm with first-degree AV block and bundle branch block. Maximum heart rate was 98 beats per minimum of 54 beats per an average of 68 bpm. There is occasional pre-atrial and preventricular contraction. There was no current evidence of high degree of heart block symptomatic bradycardia or episodes of atrial fibrillation" ECHO : "MOD LV SYSTOLIC DYSFUNCTION WITH AN ESTIMATED EF = 35 % NORMAL RIGHT VENTRICULAR SYSTOLIC FUNCTION MODERATE MITRAL VALVE INSUFFICIENCY MILD TRICUSPID VALVE INSUFFICIENCY MILD AORTIC VALVE STENOSIS MILD LA ENLARGEMENT MILD LVH LEFT BUNDLE BRANCH BLOCK" Started on Lisinopril 10mg  daily. Has Cards f/u 03/26/2016

## 2016-02-28 NOTE — Assessment & Plan Note (Signed)
Continue on all current medications.

## 2016-02-28 NOTE — Patient Instructions (Addendum)
Heart-Healthy Eating Plan Many factors influence your heart health, including eating and exercise habits. Heart (coronary) risk increases with abnormal blood fat (lipid) levels. Heart-healthy meal planning includes limiting unhealthy fats, increasing healthy fats, and making other small dietary changes. This includes maintaining a healthy body weight to help keep lipid levels within a normal range. What is my plan? Your health care provider recommends that you:  Get no more than _________% of the total calories in your daily diet from fat.  Limit your intake of saturated fat to less than _________% of your total calories each day.  Limit the amount of cholesterol in your diet to less than _________ mg per day. What types of fat should I choose?  Choose healthy fats more often. Choose monounsaturated and polyunsaturated fats, such as olive oil and canola oil, flaxseeds, walnuts, almonds, and seeds.  Eat more omega-3 fats. Good choices include salmon, mackerel, sardines, tuna, flaxseed oil, and ground flaxseeds. Aim to eat fish at least two times each week.  Limit saturated fats. Saturated fats are primarily found in animal products, such as meats, butter, and cream. Plant sources of saturated fats include palm oil, palm kernel oil, and coconut oil.  Avoid foods with partially hydrogenated oils in them. These contain trans fats. Examples of foods that contain trans fats are stick margarine, some tub margarines, cookies, crackers, and other baked goods. What general guidelines do I need to follow?  Check food labels carefully to identify foods with trans fats or high amounts of saturated fat.  Fill one half of your plate with vegetables and green salads. Eat 4-5 servings of vegetables per day. A serving of vegetables equals 1 cup of raw leafy vegetables,  cup of raw or cooked cut-up vegetables, or  cup of vegetable juice.  Fill one fourth of your plate with whole grains. Look for the word  "whole" as the first word in the ingredient list.  Fill one fourth of your plate with lean protein foods.  Eat 4-5 servings of fruit per day. A serving of fruit equals one medium whole fruit,  cup of dried fruit,  cup of fresh, frozen, or canned fruit, or  cup of 100% fruit juice.  Eat more foods that contain soluble fiber. Examples of foods that contain this type of fiber are apples, broccoli, carrots, beans, peas, and barley. Aim to get 20-30 g of fiber per day.  Eat more home-cooked food and less restaurant, buffet, and fast food.  Limit or avoid alcohol.  Limit foods that are high in starch and sugar.  Avoid fried foods.  Cook foods by using methods other than frying. Baking, boiling, grilling, and broiling are all great options. Other fat-reducing suggestions include:  Removing the skin from poultry.  Removing all visible fats from meats.  Skimming the fat off of stews, soups, and gravies before serving them.  Steaming vegetables in water or broth.  Lose weight if you are overweight. Losing just 5-10% of your initial body weight can help your overall health and prevent diseases such as diabetes and heart disease.  Increase your consumption of nuts, legumes, and seeds to 4-5 servings per week. One serving of dried beans or legumes equals  cup after being cooked, one serving of nuts equals 1 ounces, and one serving of seeds equals  ounce or 1 tablespoon.  You may need to monitor your salt (sodium) intake, especially if you have high blood pressure. Talk with your health care provider or dietitian to get more  information about reducing sodium. What foods can I eat? Grains   Breads, including [place], white, pita, wheat, raisin, rye, oatmeal, and [place]. Tortillas that are neither fried nor made with lard or trans fat. Low-fat rolls, including hotdog and hamburger buns and English muffins. Biscuits. Muffins. Waffles. Pancakes. Light popcorn. Whole-grain cereals. Flatbread.  Melba toast. Pretzels. Breadsticks. Rusks. Low-fat snacks and crackers, including oyster, saltine, matzo, graham, animal, and rye. Rice and pasta, including [place] rice and those that are made with whole wheat. Vegetables  All vegetables. Fruits  All fruits, but limit coconut. Meats and Other Protein Sources  Lean, well-trimmed beef, veal, pork, and lamb. Chicken and [place] without skin. All fish and shellfish. Wild duck, rabbit, pheasant, and venison. Egg whites or low-cholesterol egg substitutes. Dried beans, peas, lentils, and tofu.Seeds and most nuts. Dairy  Low-fat or nonfat cheeses, including ricotta, string, and mozzarella. Skim or 1% milk that is liquid, powdered, or evaporated. Buttermilk that is made with low-fat milk. Nonfat or low-fat yogurt. Beverages  Mineral water. Diet carbonated beverages. Sweets and Desserts  Sherbets and fruit ices. Honey, jam, marmalade, jelly, and syrups. Meringues and gelatins. Pure sugar candy, such as hard candy, jelly beans, gumdrops, mints, marshmallows, and small amounts of dark chocolate. [employer] Eat all sweets and desserts in moderation. Fats and Oils  Nonhydrogenated (trans-free) margarines. Vegetable oils, including soybean, sesame, sunflower, olive, peanut, safflower, corn, canola, and cottonseed. Salad dressings or mayonnaise that are made with a vegetable oil. Limit added fats and oils that you use for cooking, baking, salads, and as spreads. Other  Cocoa powder. Coffee and tea. All seasonings and condiments. The items listed above may not be a complete list of recommended foods or beverages. Contact your dietitian for more options.  What foods are not recommended? Grains  Breads that are made with saturated or trans fats, oils, or whole milk. Croissants. Butter rolls. Cheese breads. Sweet rolls. Donuts. Buttered popcorn. Chow mein noodles. High-fat crackers, such as cheese or butter crackers. Meats and Other Protein Sources  Fatty  meats, such as hotdogs, short ribs, sausage, spareribs, bacon, ribeye roast or steak, and mutton. High-fat deli meats, such as salami and bologna. Caviar. Domestic duck and goose. Organ meats, such as kidney, liver, sweetbreads, brains, gizzard, chitterlings, and heart. Dairy  Cream, sour cream, cream cheese, and creamed cottage cheese. Whole milk cheeses, including blue (bleu), Monterey Jack, [place], [place], American, [place], Swiss, [place], [place], and [place]. Whole or 2% milk that is liquid, evaporated, or condensed. Whole buttermilk. Cream sauce or high-fat cheese sauce. Yogurt that is made from whole milk. Beverages  Regular sodas and drinks with added sugar. Sweets and Desserts  Frosting. Pudding. Cookies. Cakes other than angel food cake. Candy that has milk chocolate or white chocolate, hydrogenated fat, butter, coconut, or unknown ingredients. Buttered syrups. Full-fat ice cream or ice cream drinks. Fats and Oils  Gravy that has suet, meat fat, or shortening. Cocoa butter, hydrogenated oils, palm oil, coconut oil, palm kernel oil. These can often be found in baked products, candy, fried foods, nondairy creamers, and whipped toppings. Solid fats and shortenings, including bacon fat, salt pork, lard, and butter. Nondairy cream substitutes, such as coffee creamers and sour cream substitutes. Salad dressings that are made of unknown oils, cheese, or sour cream. The items listed above may not be a complete list of foods and beverages to avoid. Contact your dietitian for more information.  This information is not intended to replace advice given to you by  your health care provider. Make sure you discuss any questions you have with your health care provider. Document Released: 09/27/2007 Document Revised: 07/08/2015 Document Reviewed: 06/11/2013 Elsevier Interactive Patient Education  2017 [employer].  Please continue medications as directed. Continue follow-up with Cardiologist as  directed. Please follow-up with Dr. Raliegh Scarlet in 3 months, sooner if needed.

## 2016-02-28 NOTE — Progress Notes (Signed)
Subjective:    Patient ID: Wanda Davenport, female    DOB: November 12, 1923, 83 y.o.   MRN: [id number]  HPI:  Wanda Davenport is here for f/u shortness of breath that was addressed by her Cardiologist.   Holter Monitor detected "Baseline normal sinus rhythm with first-degree AV block and bundle branch block. Maximum heart rate was 98 beats per minimum of 54 beats per an average of 68 bpm. There is occasional pre-atrial and preventricular contraction. There was no current evidence of high degree of heart block symptomatic bradycardia or episodes of atrial fibrillation" ECHO : "MOD LV SYSTOLIC DYSFUNCTION WITH AN ESTIMATED EF = 35 % NORMAL RIGHT VENTRICULAR SYSTOLIC FUNCTION MODERATE MITRAL VALVE INSUFFICIENCY MILD TRICUSPID VALVE INSUFFICIENCY MILD AORTIC VALVE STENOSIS MILD LA ENLARGEMENT MILD LVH LEFT BUNDLE BRANCH BLOCK" Cardiology started her on Lisinopril 10mg  daily and recommended DASH diet.  She is to return for f/u 03/26/2016 with Dr. Nehemiah Massed.  She denies CP/dyspnea/dizziness/palpitations.     Patient Care Team    Relationship Specialty Notifications Start End  Mellody Dance, DO PCP - General Family Medicine  10/24/15   Christene Slates, MD  Dermatology  10/24/15   Leandrew Koyanagi, MD Referring Physician Ophthalmology  11/06/15   Deboraha Sprang, MD Consulting Physician Cardiology  11/06/15   Gatha Mayer, MD Consulting Physician Gastroenterology  11/06/15    Comment: she used to see Delfin Edis, MD    Patient Active Problem List   Diagnosis Date Noted  . Medication monitoring encounter 01/26/2016  . Intolerance of drug- statins 01/25/2016  . Counseling regarding end of life decision making 01/25/2016  . Hypertriglyceridemia 12/13/2015  . Low serum HDL 12/13/2015  . B12 deficiency 12/13/2015  . Claudication of left lower extremity ([place]) 12/13/2015  . Vitamin D deficiency 11/06/2015  . Adjustment disorder with mixed anxiety and depressed mood 11/06/2015  . Basal cell carcinoma  10/31/2015  . Environmental and seasonal allergies 10/31/2015  . Reactive airway disease- only spring and fall due to allergies 10/31/2015  . GAD (generalized anxiety disorder) 10/13/2015  . Atypical chest pain 04/16/2011  . Chronic diastolic heart failure ([place]) 04/16/2011  . Hyperlipidemia   . Pre-diabetes   . chronic LBBB (left bundle branch block)- since 2012   . Generalized OA   . Allergic rhinitis   . DIVERTICULOSIS, COLON 12/03/2007  . Hypertension with CHF ( diastolic dysf grade I)  [id number]     Past Medical History:  Diagnosis Date  . Allergic rhinitis   . Anxiety   . Arthritis   . Atypical chest pain   . Bronchitis 01/14/2016  . Chronic diastolic CHF (congestive heart failure) (HCC)    Echo 06/2010 EF 60-65%, no RWMAs, grade 1 diastolic dysfunction, mild LAE, PASP 42mmHg.  Marland Kitchen DIVERTICULOSIS, COLON   . HEMORRHOIDS, INTERNAL   . Hepatic steatosis   . Hyperlipidemia   . HYPERTENSION   . LBBB (left bundle branch block)    a. present on ECG 06/2010  . Osteopenia   . Pre-diabetes    A1c 6.0     Past Surgical History:  Procedure Laterality Date  . ABDOMINAL HYSTERECTOMY    . BREAST SURGERY    . CATARACT EXTRACTION W/PHACO Left 05/12/2014   Procedure: CATARACT EXTRACTION PHACO AND INTRAOCULAR LENS PLACEMENT (IOC);  Surgeon: Leandrew Koyanagi, MD;  Location: [facility];  Service: Ophthalmology;  Laterality: Left;  . COLONOSCOPY     a. 2010  . TONSILLECTOMY       Family History  Problem Relation Age of Onset  . Colon cancer Sister   . Melanoma Sister   . Hyperlipidemia Brother   . Hypertension Mother   . Lupus Mother     died 81 from surgical complications  . Alzheimer's disease Brother     unknown  . Kidney disease Father     died 17  . Hypertension Father   . Alcohol abuse Father      History  Drug Use No     History  Alcohol Use No     History  Smoking Status  . Former Smoker  . Packs/day: 0.25  . Years: 20.00  . Types:  Cigarettes  . Quit date: 01/02/1971  Smokeless Tobacco  . Never Used    Comment: smoked a few cigarettes/day x 20 yrs - quit @ age 53.     Outpatient Encounter Prescriptions as of 02/28/2016  Medication Sig  . albuterol (PROVENTIL HFA;VENTOLIN HFA) 108 (90 BASE) MCG/ACT inhaler Inhale 2 puffs into the lungs every 6 (six) hours as needed. Wheezing or shortness of breath  . ALPRAZolam (XANAX) 0.25 MG tablet Take 0.25 mg by mouth daily as needed for anxiety.  Marland Kitchen aspirin 325 MG EC tablet Take 325 mg by mouth daily.  . benzonatate (TESSALON) 200 MG capsule Take 1 capsule by mouth 3 (three) times daily as needed.  . bisoprolol (ZEBETA) 10 MG tablet Take 1 tablet (10 mg total) by mouth daily.  . Calcium Carbonate-Vitamin D 600-400 MG-UNIT tablet Take 2 tablets by mouth daily.  Marland Kitchen escitalopram (LEXAPRO) 10 MG tablet Take 1 tablet (10 mg total) by mouth daily.  . fluticasone (FLONASE) 50 MCG/ACT nasal spray Place 2 sprays into the nose daily as needed. Nasal congestion Rarely used  . lisinopril (PRINIVIL,ZESTRIL) 10 MG tablet Take 1 tablet by mouth daily.  . montelukast (SINGULAIR) 10 MG tablet Take 1 tablet (10 mg total) by mouth at bedtime. For wheeze during spring and fall  . Red Yeast Rice 600 MG TABS Take 4 tablets (2,400 mg total) by mouth at bedtime.   No facility-administered encounter medications on file as of 02/28/2016.     Allergies: Fenofibrate; Statins; and Sulfonamide derivatives  Body mass index is 28.06 kg/m.  Blood pressure 128/77, pulse 91, height 5\' 4"  (1.626 m), weight 163 lb 8 oz (74.2 kg).    Review of Systems  Constitutional: Positive for activity change. Negative for appetite change, chills, diaphoresis, fatigue, fever and unexpected weight change.  Eyes: Negative for visual disturbance.  Respiratory: Positive for shortness of breath. Negative for cough, wheezing and stridor.   Cardiovascular: Positive for leg swelling. Negative for chest pain and palpitations.   Gastrointestinal: Negative for abdominal distention, abdominal pain, constipation, diarrhea, nausea and vomiting.  Musculoskeletal: Positive for arthralgias, back pain, gait problem, joint swelling and myalgias.  Skin: Negative for color change, pallor, rash and wound.  Neurological: Negative for dizziness, tremors, weakness, light-headedness, numbness and headaches.       Objective:   Physical Exam  Constitutional: She is oriented to person, place, and time. She appears well-developed and well-nourished. No distress.  HENT:  Head: Normocephalic and atraumatic.  Right Ear: Decreased hearing is noted.  Left Ear: Decreased hearing is noted.  Cardiovascular: Normal rate, regular rhythm, normal heart sounds and intact distal pulses.   Pulmonary/Chest: Effort normal and breath sounds normal. No respiratory distress. She has no wheezes. She has no rales. She exhibits no tenderness.  Musculoskeletal: She exhibits edema.       Right  ankle: She exhibits swelling.       Left ankle: She exhibits swelling.  Neurological: She is alert and oriented to person, place, and time. She has normal reflexes.  Skin: Skin is warm and dry. She is not diaphoretic.  Psychiatric: She has a normal mood and affect. Her behavior is normal. Judgment and thought content normal.          Assessment & Plan:   1. chronic LBBB (left bundle branch block)- since 2012   2. Hypertension with CHF ( diastolic dysf grade I)    3. Medication monitoring encounter     Hypertension with CHF ( diastolic dysf grade I)  From most recent Cardiologist visits: Holter Monitor detected "Baseline normal sinus rhythm with first-degree AV block and bundle branch block. Maximum heart rate was 98 beats per minimum of 54 beats per an average of 68 bpm. There is occasional pre-atrial and preventricular contraction. There was no current evidence of high degree of heart block symptomatic bradycardia or episodes of atrial fibrillation" ECHO  : "MOD LV SYSTOLIC DYSFUNCTION WITH AN ESTIMATED EF = 35 % NORMAL RIGHT VENTRICULAR SYSTOLIC FUNCTION MODERATE MITRAL VALVE INSUFFICIENCY MILD TRICUSPID VALVE INSUFFICIENCY MILD AORTIC VALVE STENOSIS MILD LA ENLARGEMENT MILD LVH LEFT BUNDLE BRANCH BLOCK" Started on Lisinopril 10mg  daily. Has Cards f/u 03/26/2016  Medication monitoring encounter Continue on all current medications.    FOLLOW-UP:  Return in about 3 months (around 05/27/2016), or if symptoms worsen or fail to improve, for Regular Follow Up.

## 2016-03-02 NOTE — Telephone Encounter (Signed)
Error

## 2016-03-26 DIAGNOSIS — I10 Essential (primary) hypertension: Secondary | ICD-10-CM | POA: Diagnosis not present

## 2016-03-26 DIAGNOSIS — I5022 Chronic systolic (congestive) heart failure: Secondary | ICD-10-CM | POA: Diagnosis not present

## 2016-03-29 ENCOUNTER — Telehealth: Payer: Self-pay

## 2016-03-29 MED ORDER — BISOPROLOL FUMARATE 10 MG PO TABS: 10.0000 mg | ORAL_TABLET | Freq: Every day | ORAL | 0 refills | Status: DC

## 2016-03-29 NOTE — Telephone Encounter (Signed)
RX refill.  Charyl Bigger, CMA

## 2016-04-10 ENCOUNTER — Encounter: Payer: Self-pay | Admitting: Adult Health

## 2016-04-10 ENCOUNTER — Ambulatory Visit (INDEPENDENT_AMBULATORY_CARE_PROVIDER_SITE_OTHER): Payer: Medicare HMO | Admitting: Adult Health

## 2016-04-10 DIAGNOSIS — M25561 Pain in right knee: Secondary | ICD-10-CM | POA: Insufficient documentation

## 2016-04-10 DIAGNOSIS — G8929 Other chronic pain: Secondary | ICD-10-CM | POA: Diagnosis not present

## 2016-04-10 DIAGNOSIS — M25562 Pain in left knee: Secondary | ICD-10-CM

## 2016-04-10 NOTE — Progress Notes (Signed)
Subjective:    Patient ID: Wanda Davenport, female    DOB: April 19, 1923, 83 y.o.   MRN: [id number]   HPI:  Wanda Davenport is here for evaluation and completion of insurance form for a four-pronged cane .  She is a very pleasant 83 year old that lives at home, with local family visiting daily.  She is compliant on all medications and denies SE. She feels safe at home and feels that a cane will ease ambulation from room to room. She reports needing to use furniture/walls to steady herself when walking.  She reports bil knee pain with L> R. She denies previous knee surgeries. She denies hx of falls.   Patient Care Team    Relationship Specialty Notifications Start End  Mellody Dance, DO PCP - General Family Medicine  10/24/15   Christene Slates, MD  Dermatology  10/24/15   Leandrew Koyanagi, MD Referring Physician Ophthalmology  11/06/15   Deboraha Sprang, MD Consulting Physician Cardiology  11/06/15   Gatha Mayer, MD Consulting Physician Gastroenterology  11/06/15    Comment: she used to see Delfin Edis, MD    Patient Active Problem List   Diagnosis Date Noted  . Knee pain, bilateral 04/10/2016  . Medication monitoring encounter 01/26/2016  . Intolerance of drug- statins 01/25/2016  . Counseling regarding end of life decision making 01/25/2016  . Hypertriglyceridemia 12/13/2015  . Low serum HDL 12/13/2015  . B12 deficiency 12/13/2015  . Claudication of left lower extremity ([place]) 12/13/2015  . Vitamin D deficiency 11/06/2015  . Adjustment disorder with mixed anxiety and depressed mood 11/06/2015  . Basal cell carcinoma 10/31/2015  . Environmental and seasonal allergies 10/31/2015  . Reactive airway disease- only spring and fall due to allergies 10/31/2015  . GAD (generalized anxiety disorder) 10/13/2015  . Atypical chest pain 04/16/2011  . Chronic diastolic heart failure ([place]) 04/16/2011  . Hyperlipidemia   . Pre-diabetes   . chronic LBBB (left bundle branch block)- since 2012   .  Generalized OA   . Allergic rhinitis   . DIVERTICULOSIS, COLON 12/03/2007  . Hypertension with CHF ( diastolic dysf grade I)  50/09/3816     Past Medical History:  Diagnosis Date  . Allergic rhinitis   . Anxiety   . Arthritis   . Atypical chest pain   . Bronchitis 01/14/2016  . Chronic diastolic CHF (congestive heart failure) (HCC)    Echo 06/2010 EF 60-65%, no RWMAs, grade 1 diastolic dysfunction, mild LAE, PASP 45mmHg.  Marland Kitchen DIVERTICULOSIS, COLON   . HEMORRHOIDS, INTERNAL   . Hepatic steatosis   . Hyperlipidemia   . HYPERTENSION   . LBBB (left bundle branch block)    a. present on ECG 06/2010  . Osteopenia   . Pre-diabetes    A1c 6.0     Past Surgical History:  Procedure Laterality Date  . ABDOMINAL HYSTERECTOMY    . BREAST SURGERY    . CATARACT EXTRACTION W/PHACO Left 05/12/2014   Procedure: CATARACT EXTRACTION PHACO AND INTRAOCULAR LENS PLACEMENT (IOC);  Surgeon: Leandrew Koyanagi, MD;  Location: [facility];  Service: Ophthalmology;  Laterality: Left;  . COLONOSCOPY     a. 2010  . TONSILLECTOMY       Family History  Problem Relation Age of Onset  . Colon cancer Sister   . Melanoma Sister   . Hyperlipidemia Brother   . Hypertension Mother   . Lupus Mother     died 76 from surgical complications  . Alzheimer's disease  Brother     unknown  . Kidney disease Father     died 12  . Hypertension Father   . Alcohol abuse Father      History  Drug Use No     History  Alcohol Use No     History  Smoking Status  . Former Smoker  . Packs/day: 0.25  . Years: 20.00  . Types: Cigarettes  . Quit date: 01/02/1971  Smokeless Tobacco  . Never Used    Comment: smoked a few cigarettes/day x 20 yrs - quit @ age 75.     Outpatient Encounter Prescriptions as of 04/10/2016  Medication Sig  . albuterol (PROVENTIL HFA;VENTOLIN HFA) 108 (90 BASE) MCG/ACT inhaler Inhale 2 puffs into the lungs every 6 (six) hours as needed. Wheezing or shortness of breath  .  ALPRAZolam (XANAX) 0.25 MG tablet Take 0.25 mg by mouth daily as needed for anxiety.  Marland Kitchen aspirin 325 MG EC tablet Take 325 mg by mouth daily.  . bisoprolol (ZEBETA) 10 MG tablet Take 1 tablet (10 mg total) by mouth daily.  . Calcium Carbonate-Vitamin D 600-400 MG-UNIT tablet Take 2 tablets by mouth daily.  Marland Kitchen escitalopram (LEXAPRO) 10 MG tablet Take 1 tablet (10 mg total) by mouth daily. (Patient taking differently: Take 10 mg by mouth daily as needed. )  . fluticasone (FLONASE) 50 MCG/ACT nasal spray Place 2 sprays into the nose daily as needed. Nasal congestion Rarely used  . lisinopril (PRINIVIL,ZESTRIL) 10 MG tablet Take 1 tablet by mouth daily.  . montelukast (SINGULAIR) 10 MG tablet Take 1 tablet (10 mg total) by mouth at bedtime. For wheeze during spring and fall  . [DISCONTINUED] benzonatate (TESSALON) 200 MG capsule Take 1 capsule by mouth 3 (three) times daily as needed.  . [DISCONTINUED] Red Yeast Rice 600 MG TABS Take 4 tablets (2,400 mg total) by mouth at bedtime.   No facility-administered encounter medications on file as of 04/10/2016.     Allergies: Fenofibrate; Statins; and Sulfonamide derivatives  Body mass index is 28.22 kg/m.  Blood pressure (!) 147/70, pulse 61, height 5\' 4"  (1.626 m), weight 164 lb 6.4 oz (74.6 kg).     Review of Systems  Constitutional: Negative for activity change, appetite change and unexpected weight change.  Eyes: Negative for visual disturbance.  Respiratory: Negative for cough, chest tightness, shortness of breath, wheezing and stridor.   Cardiovascular: Negative for chest pain, palpitations and leg swelling.  Genitourinary: Negative for difficulty urinating and flank pain.  Musculoskeletal: Positive for arthralgias, back pain, gait problem, joint swelling and myalgias. Negative for neck pain and neck stiffness.  Skin: Negative for color change, pallor, rash and wound.  Neurological: Negative for dizziness, tremors, weakness and headaches.        Objective:   Physical Exam  Constitutional: She is oriented to person, place, and time. She appears well-developed and well-nourished. No distress.  HENT:  Right Ear: Decreased hearing is noted.  Left Ear: Decreased hearing is noted.  Musculoskeletal: She exhibits tenderness. She exhibits no edema.       Right knee: She exhibits decreased range of motion and swelling. Tenderness found.       Left knee: She exhibits decreased range of motion and swelling. Tenderness found.  Neurological: She is alert and oriented to person, place, and time.  Skin: Skin is warm and dry. No rash noted. She is not diaphoretic. No erythema. No pallor.  Psychiatric: She has a normal mood and affect. Her behavior is normal.  Judgment and thought content normal.  Nursing note and vitals reviewed.         Assessment & Plan:   1. Chronic pain of both knees     Knee pain, bilateral Four pronged cane request forms completed and provided to pt. Please call clinic with any questions/concerns. Form scanned into chart. Please call clinic with any questions/concerns.     FOLLOW-UP:  Return if symptoms worsen or fail to improve.

## 2016-04-10 NOTE — Patient Instructions (Signed)
Fall Prevention in the Home Falls can cause injuries. They can happen to people of all ages. There are many things you can do to make your home safe and to help prevent falls. What can I do on the outside of my home?  Regularly fix the edges of walkways and driveways and fix any cracks.  Remove anything that might make you trip as you walk through a door, such as a raised step or threshold.  Trim any bushes or trees on the path to your home.  Use bright outdoor lighting.  Clear any walking paths of anything that might make someone trip, such as rocks or tools.  Regularly check to see if handrails are loose or broken. Make sure that both sides of any steps have handrails.  Any raised decks and porches should have guardrails on the edges.  Have any leaves, snow, or ice cleared regularly.  Use sand or salt on walking paths during winter.  Clean up any spills in your garage right away. This includes oil or grease spills. What can I do in the bathroom?  Use night lights.  Install grab bars by the toilet and in the tub and shower. Do not use towel bars as grab bars.  Use non-skid mats or decals in the tub or shower.  If you need to sit down in the shower, use a plastic, non-slip stool.  Keep the floor dry. Clean up any water that spills on the floor as soon as it happens.  Remove soap buildup in the tub or shower regularly.  Attach bath mats securely with double-sided non-slip rug tape.  Do not have throw rugs and other things on the floor that can make you trip. What can I do in the bedroom?  Use night lights.  Make sure that you have a light by your bed that is easy to reach.  Do not use any sheets or blankets that are too big for your bed. They should not hang down onto the floor.  Have a firm chair that has side arms. You can use this for support while you get dressed.  Do not have throw rugs and other things on the floor that can make you trip. What can I do in the  kitchen?  Clean up any spills right away.  Avoid walking on wet floors.  Keep items that you use a lot in easy-to-reach places.  If you need to reach something above you, use a strong step stool that has a grab bar.  Keep electrical cords out of the way.  Do not use floor polish or wax that makes floors slippery. If you must use wax, use non-skid floor wax.  Do not have throw rugs and other things on the floor that can make you trip. What can I do with my stairs?  Do not leave any items on the stairs.  Make sure that there are handrails on both sides of the stairs and use them. Fix handrails that are broken or loose. Make sure that handrails are as long as the stairways.  Check any carpeting to make sure that it is firmly attached to the stairs. Fix any carpet that is loose or worn.  Avoid having throw rugs at the top or bottom of the stairs. If you do have throw rugs, attach them to the floor with carpet tape.  Make sure that you have a light switch at the top of the stairs and the bottom of the stairs. If you do   not have them, ask someone to add them for you. What else can I do to help prevent falls?  Wear shoes that:  Do not have high heels.  Have rubber bottoms.  Are comfortable and fit you well.  Are closed at the toe. Do not wear sandals.  If you use a stepladder:  Make sure that it is fully opened. Do not climb a closed stepladder.  Make sure that both sides of the stepladder are locked into place.  Ask someone to hold it for you, if possible.  Clearly mark and make sure that you can see:  Any grab bars or handrails.  First and last steps.  Where the edge of each step is.  Use tools that help you move around (mobility aids) if they are needed. These include:  Canes.  Walkers.  Scooters.  Crutches.  Turn on the lights when you go into a dark area. Replace any light bulbs as soon as they burn out.  Set up your furniture so you have a clear path.  Avoid moving your furniture around.  If any of your floors are uneven, fix them.  If there are any pets around you, be aware of where they are.  Review your medicines with your doctor. Some medicines can make you feel dizzy. This can increase your chance of falling. Ask your doctor what other things that you can do to help prevent falls. This information is not intended to replace advice given to you by your health care provider. Make sure you discuss any questions you have with your health care provider. Document Released: 10/14/2008 Document Revised: 05/26/2015 Document Reviewed: 01/22/2014 Elsevier Interactive Patient Education  2017 [employer].  Copy of insurance request form provided to pt and faxed to carrier.   Form scanned into chart. Please call with any questions/concerns.

## 2016-04-10 NOTE — Assessment & Plan Note (Addendum)
Four pronged cane request forms completed and provided to pt. Please call clinic with any questions/concerns. Form scanned into chart. Please call clinic with any questions/concerns.

## 2016-04-12 DIAGNOSIS — M25569 Pain in unspecified knee: Secondary | ICD-10-CM | POA: Diagnosis not present

## 2016-04-12 DIAGNOSIS — R2689 Other abnormalities of gait and mobility: Secondary | ICD-10-CM | POA: Diagnosis not present

## 2016-04-26 ENCOUNTER — Inpatient Hospital Stay
Admission: EM | Admit: 2016-04-26 | Discharge: 2016-04-30 | DRG: 246 | Disposition: A | Discharge status: Skilled Nursing Facility | Payer: Medicare HMO | Attending: Internal Medicine | Admitting: Internal Medicine

## 2016-04-26 ENCOUNTER — Other Ambulatory Visit: Payer: Self-pay

## 2016-04-26 ENCOUNTER — Encounter: Payer: Self-pay | Admitting: Emergency Medicine

## 2016-04-26 ENCOUNTER — Inpatient Hospital Stay
Admit: 2016-04-26 | Discharge: 2016-04-26 | Disposition: A | Discharge status: Home / Self Care | Payer: Medicare HMO | Attending: Cardiology | Admitting: Cardiology

## 2016-04-26 ENCOUNTER — Encounter
Admission: EM | Disposition: A | Discharge status: Skilled Nursing Facility | Payer: Self-pay | Source: Home / Self Care | Attending: Internal Medicine

## 2016-04-26 ENCOUNTER — Emergency Department: Payer: Medicare HMO

## 2016-04-26 DIAGNOSIS — E785 Hyperlipidemia, unspecified: Secondary | ICD-10-CM | POA: Diagnosis not present

## 2016-04-26 DIAGNOSIS — I509 Heart failure, unspecified: Secondary | ICD-10-CM | POA: Diagnosis not present

## 2016-04-26 DIAGNOSIS — I4891 Unspecified atrial fibrillation: Secondary | ICD-10-CM | POA: Diagnosis present

## 2016-04-26 DIAGNOSIS — Z888 Allergy status to other drugs, medicaments and biological substances status: Secondary | ICD-10-CM

## 2016-04-26 DIAGNOSIS — Z6828 Body mass index (BMI) 28.0-28.9, adult: Secondary | ICD-10-CM

## 2016-04-26 DIAGNOSIS — I5021 Acute systolic (congestive) heart failure: Secondary | ICD-10-CM | POA: Diagnosis not present

## 2016-04-26 DIAGNOSIS — R7303 Prediabetes: Secondary | ICD-10-CM | POA: Diagnosis not present

## 2016-04-26 DIAGNOSIS — R0602 Shortness of breath: Secondary | ICD-10-CM | POA: Diagnosis not present

## 2016-04-26 DIAGNOSIS — Z8249 Family history of ischemic heart disease and other diseases of the circulatory system: Secondary | ICD-10-CM

## 2016-04-26 DIAGNOSIS — I110 Hypertensive heart disease with heart failure: Secondary | ICD-10-CM | POA: Diagnosis present

## 2016-04-26 DIAGNOSIS — I447 Left bundle-branch block, unspecified: Secondary | ICD-10-CM | POA: Diagnosis not present

## 2016-04-26 DIAGNOSIS — Z959 Presence of cardiac and vascular implant and graft, unspecified: Secondary | ICD-10-CM | POA: Diagnosis not present

## 2016-04-26 DIAGNOSIS — I213 ST elevation (STEMI) myocardial infarction of unspecified site: Secondary | ICD-10-CM | POA: Diagnosis not present

## 2016-04-26 DIAGNOSIS — Z9911 Dependence on respirator [ventilator] status: Secondary | ICD-10-CM | POA: Diagnosis not present

## 2016-04-26 DIAGNOSIS — Z87891 Personal history of nicotine dependence: Secondary | ICD-10-CM

## 2016-04-26 DIAGNOSIS — F419 Anxiety disorder, unspecified: Secondary | ICD-10-CM | POA: Diagnosis not present

## 2016-04-26 DIAGNOSIS — R079 Chest pain, unspecified: Secondary | ICD-10-CM | POA: Diagnosis not present

## 2016-04-26 DIAGNOSIS — E669 Obesity, unspecified: Secondary | ICD-10-CM | POA: Diagnosis present

## 2016-04-26 DIAGNOSIS — I25110 Atherosclerotic heart disease of native coronary artery with unstable angina pectoris: Secondary | ICD-10-CM | POA: Diagnosis present

## 2016-04-26 DIAGNOSIS — Z9861 Coronary angioplasty status: Secondary | ICD-10-CM | POA: Diagnosis not present

## 2016-04-26 DIAGNOSIS — I2102 ST elevation (STEMI) myocardial infarction involving left anterior descending coronary artery: Secondary | ICD-10-CM | POA: Diagnosis not present

## 2016-04-26 DIAGNOSIS — I255 Ischemic cardiomyopathy: Secondary | ICD-10-CM | POA: Diagnosis present

## 2016-04-26 DIAGNOSIS — I2109 ST elevation (STEMI) myocardial infarction involving other coronary artery of anterior wall: Secondary | ICD-10-CM | POA: Diagnosis not present

## 2016-04-26 DIAGNOSIS — Z882 Allergy status to sulfonamides status: Secondary | ICD-10-CM

## 2016-04-26 DIAGNOSIS — I5043 Acute on chronic combined systolic (congestive) and diastolic (congestive) heart failure: Secondary | ICD-10-CM | POA: Diagnosis not present

## 2016-04-26 DIAGNOSIS — J309 Allergic rhinitis, unspecified: Secondary | ICD-10-CM | POA: Diagnosis not present

## 2016-04-26 DIAGNOSIS — Z7982 Long term (current) use of aspirin: Secondary | ICD-10-CM

## 2016-04-26 DIAGNOSIS — I5022 Chronic systolic (congestive) heart failure: Secondary | ICD-10-CM | POA: Diagnosis not present

## 2016-04-26 DIAGNOSIS — I200 Unstable angina: Secondary | ICD-10-CM | POA: Diagnosis not present

## 2016-04-26 DIAGNOSIS — I350 Nonrheumatic aortic (valve) stenosis: Secondary | ICD-10-CM | POA: Diagnosis not present

## 2016-04-26 DIAGNOSIS — I10 Essential (primary) hypertension: Secondary | ICD-10-CM | POA: Diagnosis not present

## 2016-04-26 DIAGNOSIS — J449 Chronic obstructive pulmonary disease, unspecified: Secondary | ICD-10-CM | POA: Diagnosis not present

## 2016-04-26 HISTORY — PX: LEFT HEART CATH AND CORONARY ANGIOGRAPHY: CATH118249

## 2016-04-26 HISTORY — PX: CORONARY STENT INTERVENTION: CATH118234

## 2016-04-26 LAB — APTT: aPTT: <24 seconds — ABNORMAL LOW (ref 24–36)

## 2016-04-26 LAB — MRSA PCR SCREENING: MRSA by PCR: POSITIVE — AB

## 2016-04-26 LAB — CBC
HEMATOCRIT: 38.7 % (ref 35.0–47.0)
HEMOGLOBIN: 13.2 g/dL (ref 12.0–16.0)
MCH: 29.5 pg (ref 26.0–34.0)
MCHC: 34.1 g/dL (ref 32.0–36.0)
MCV: 86.4 fL (ref 80.0–100.0)
Platelets: 257 10*3/uL (ref 150–440)
RBC: 4.48 MIL/uL (ref 3.80–5.20)
RDW: 15.9 % — ABNORMAL HIGH (ref 11.5–14.5)
WBC: 14 10*3/uL — ABNORMAL HIGH (ref 3.6–11.0)

## 2016-04-26 LAB — BASIC METABOLIC PANEL
ANION GAP: 7 (ref 5–15)
BUN: 30 mg/dL — AB (ref 6–20)
CHLORIDE: 109 mmol/L (ref 101–111)
CO2: 21 mmol/L — ABNORMAL LOW (ref 22–32)
Calcium: 9.7 mg/dL (ref 8.9–10.3)
Creatinine, Ser: 0.89 mg/dL (ref 0.44–1.00)
GFR calc Af Amer: >60 mL/min (ref >60)
GFR calc non Af Amer: 55 mL/min — ABNORMAL LOW (ref >60)
GLUCOSE: 144 mg/dL — AB (ref 65–99)
POTASSIUM: 4.4 mmol/L (ref 3.5–5.1)
Sodium: 137 mmol/L (ref 135–145)

## 2016-04-26 LAB — PHOSPHORUS: Phosphorus: 2.5 mg/dL (ref 2.5–4.6)

## 2016-04-26 LAB — MAGNESIUM: Magnesium: 2 mg/dL (ref 1.7–2.4)

## 2016-04-26 LAB — TROPONIN I: Troponin I: <0.03 ng/mL (ref <0.03)

## 2016-04-26 LAB — PROTIME-INR
INR: 0.93
PROTHROMBIN TIME: 12.5 s (ref 11.4–15.2)

## 2016-04-26 LAB — GLUCOSE, CAPILLARY: GLUCOSE-CAPILLARY: 121 mg/dL — AB (ref 65–99)

## 2016-04-26 SURGERY — LEFT HEART CATH AND CORONARY ANGIOGRAPHY
Anesthesia: Moderate Sedation

## 2016-04-26 MED ORDER — NITROGLYCERIN 5 MG/ML IV SOLN
INTRAVENOUS | Status: AC
  Filled 2016-04-26: qty 10

## 2016-04-26 MED ORDER — HEPARIN (PORCINE) IN NACL 100-0.45 UNIT/ML-% IJ SOLN
800.0000 [IU]/h | INTRAMUSCULAR | Status: DC
Start: 2016-04-26 — End: 2016-04-26
  Administered 2016-04-26: 800 [IU]/h via INTRAVENOUS
  Filled 2016-04-26: qty 250

## 2016-04-26 MED ORDER — ENOXAPARIN SODIUM 40 MG/0.4ML ~~LOC~~ SOLN
40.0000 mg | SUBCUTANEOUS | Status: DC
  Administered 2016-04-26 – 2016-04-29 (×4): 40 mg via SUBCUTANEOUS
  Filled 2016-04-26 (×4): qty 0.4

## 2016-04-26 MED ORDER — BISOPROLOL FUMARATE 5 MG PO TABS
10.0000 mg | ORAL_TABLET | Freq: Every day | ORAL | Status: DC
  Administered 2016-04-27 – 2016-04-30 (×4): 10 mg via ORAL
  Filled 2016-04-26: qty 2
  Filled 2016-04-26: qty 1
  Filled 2016-04-26: qty 2
  Filled 2016-04-26: qty 1

## 2016-04-26 MED ORDER — CHLORHEXIDINE GLUCONATE CLOTH 2 % EX PADS
6.0000 | MEDICATED_PAD | Freq: Every day | CUTANEOUS | Status: DC
  Administered 2016-04-27 – 2016-04-30 (×3): 6 via TOPICAL

## 2016-04-26 MED ORDER — HYDRALAZINE HCL 20 MG/ML IJ SOLN: 5.0000 mg | INTRAMUSCULAR | Status: AC | PRN

## 2016-04-26 MED ORDER — ASPIRIN 81 MG PO CHEW
81.0000 mg | CHEWABLE_TABLET | Freq: Every day | ORAL | Status: DC
  Administered 2016-04-26 – 2016-04-30 (×5): 81 mg via ORAL
  Filled 2016-04-26 (×5): qty 1

## 2016-04-26 MED ORDER — SODIUM CHLORIDE 0.9% FLUSH: 3.0000 mL | INTRAVENOUS | Status: DC | PRN

## 2016-04-26 MED ORDER — MUPIROCIN 2 % EX OINT
1.0000 "application " | TOPICAL_OINTMENT | Freq: Two times a day (BID) | CUTANEOUS | Status: DC
  Administered 2016-04-26 – 2016-04-30 (×7): 1 via NASAL
  Filled 2016-04-26 (×2): qty 22

## 2016-04-26 MED ORDER — MORPHINE SULFATE (PF) 4 MG/ML IV SOLN
INTRAVENOUS | Status: AC
  Administered 2016-04-26: 4 mg via INTRAVENOUS
  Filled 2016-04-26: qty 1

## 2016-04-26 MED ORDER — BIVALIRUDIN TRIFLUOROACETATE 250 MG IV SOLR
INTRAVENOUS | Status: AC
  Filled 2016-04-26: qty 250

## 2016-04-26 MED ORDER — PANTOPRAZOLE SODIUM 40 MG PO TBEC
40.0000 mg | DELAYED_RELEASE_TABLET | Freq: Every day | ORAL | Status: DC
  Administered 2016-04-26 – 2016-04-30 (×5): 40 mg via ORAL
  Filled 2016-04-26 (×5): qty 1

## 2016-04-26 MED ORDER — FENTANYL CITRATE (PF) 100 MCG/2ML IJ SOLN
INTRAMUSCULAR | Status: DC | PRN
  Administered 2016-04-26: 25 ug via INTRAVENOUS

## 2016-04-26 MED ORDER — NITROGLYCERIN 0.4 MG SL SUBL
0.4000 mg | SUBLINGUAL_TABLET | SUBLINGUAL | Status: DC | PRN
  Administered 2016-04-26 – 2016-04-27 (×3): 0.4 mg via SUBLINGUAL
  Filled 2016-04-26: qty 1
  Filled 2016-04-26: qty 3

## 2016-04-26 MED ORDER — ONDANSETRON HCL 4 MG/2ML IJ SOLN
4.0000 mg | Freq: Once | INTRAMUSCULAR | Status: AC
  Administered 2016-04-26: 4 mg via INTRAVENOUS

## 2016-04-26 MED ORDER — MIDAZOLAM HCL 2 MG/2ML IJ SOLN
INTRAMUSCULAR | Status: AC
  Filled 2016-04-26: qty 2

## 2016-04-26 MED ORDER — SODIUM CHLORIDE 0.9 % WEIGHT BASED INFUSION: 1.0000 mL/kg/h | INTRAVENOUS | Status: AC

## 2016-04-26 MED ORDER — HEPARIN (PORCINE) IN NACL 100-0.45 UNIT/ML-% IJ SOLN: 12.0000 [IU]/kg/h | Freq: Once | INTRAMUSCULAR | Status: DC

## 2016-04-26 MED ORDER — MORPHINE SULFATE (PF) 4 MG/ML IV SOLN
1.0000 mg | INTRAVENOUS | Status: DC | PRN
  Administered 2016-04-26 (×2): 1 mg via INTRAVENOUS
  Filled 2016-04-26 (×2): qty 1

## 2016-04-26 MED ORDER — MONTELUKAST SODIUM 10 MG PO TABS
10.0000 mg | ORAL_TABLET | Freq: Every day | ORAL | Status: DC
  Administered 2016-04-26 – 2016-04-29 (×4): 10 mg via ORAL
  Filled 2016-04-26 (×4): qty 1

## 2016-04-26 MED ORDER — TICAGRELOR 90 MG PO TABS
ORAL_TABLET | ORAL | Status: AC
  Filled 2016-04-26: qty 2

## 2016-04-26 MED ORDER — TICAGRELOR 90 MG PO TABS
90.0000 mg | ORAL_TABLET | Freq: Two times a day (BID) | ORAL | Status: DC
  Administered 2016-04-26 – 2016-04-30 (×9): 90 mg via ORAL
  Filled 2016-04-26 (×9): qty 1

## 2016-04-26 MED ORDER — FENTANYL CITRATE (PF) 100 MCG/2ML IJ SOLN
INTRAMUSCULAR | Status: AC
  Filled 2016-04-26: qty 2

## 2016-04-26 MED ORDER — IOPAMIDOL (ISOVUE-300) INJECTION 61%
INTRAVENOUS | Status: DC | PRN
  Administered 2016-04-26: 180 mL via INTRA_ARTERIAL

## 2016-04-26 MED ORDER — LISINOPRIL 20 MG PO TABS
20.0000 mg | ORAL_TABLET | Freq: Every day | ORAL | Status: DC
  Administered 2016-04-27 – 2016-04-30 (×4): 20 mg via ORAL
  Filled 2016-04-26 (×4): qty 1

## 2016-04-26 MED ORDER — HEPARIN (PORCINE) IN NACL 2-0.9 UNIT/ML-% IJ SOLN
INTRAMUSCULAR | Status: AC
  Filled 2016-04-26: qty 500

## 2016-04-26 MED ORDER — ALPRAZOLAM 0.25 MG PO TABS
0.2500 mg | ORAL_TABLET | Freq: Every day | ORAL | Status: DC | PRN
  Administered 2016-04-26 – 2016-04-29 (×3): 0.25 mg via ORAL
  Filled 2016-04-26 (×4): qty 1

## 2016-04-26 MED ORDER — ACETAMINOPHEN 325 MG PO TABS
650.0000 mg | ORAL_TABLET | ORAL | Status: DC | PRN
  Administered 2016-04-29 (×2): 650 mg via ORAL
  Filled 2016-04-26 (×2): qty 2

## 2016-04-26 MED ORDER — ASPIRIN 81 MG PO CHEW: 324.0000 mg | CHEWABLE_TABLET | ORAL | Status: AC

## 2016-04-26 MED ORDER — ONDANSETRON HCL 4 MG/2ML IJ SOLN
INTRAMUSCULAR | Status: AC
  Administered 2016-04-26: 4 mg via INTRAVENOUS
  Filled 2016-04-26: qty 2

## 2016-04-26 MED ORDER — SODIUM CHLORIDE 0.9 % IV SOLN: 250.0000 mL | INTRAVENOUS | Status: DC | PRN

## 2016-04-26 MED ORDER — SODIUM CHLORIDE 0.9 % IV SOLN
INTRAVENOUS | Status: DC | PRN
  Administered 2016-04-26: 1.75 mg/kg/h via INTRAVENOUS

## 2016-04-26 MED ORDER — TICAGRELOR 90 MG PO TABS
ORAL_TABLET | ORAL | Status: DC | PRN
  Administered 2016-04-26: 180 mg via ORAL

## 2016-04-26 MED ORDER — MORPHINE SULFATE (PF) 4 MG/ML IV SOLN
4.0000 mg | Freq: Once | INTRAVENOUS | Status: AC
  Administered 2016-04-26: 4 mg via INTRAVENOUS

## 2016-04-26 MED ORDER — HEPARIN BOLUS VIA INFUSION
4000.0000 [IU] | Freq: Once | INTRAVENOUS | Status: DC
  Filled 2016-04-26: qty 4000

## 2016-04-26 MED ORDER — NITROGLYCERIN IN D5W 200-5 MCG/ML-% IV SOLN
0.0000 ug/min | Freq: Once | INTRAVENOUS | Status: AC
  Administered 2016-04-26: 5 ug/min via INTRAVENOUS
  Filled 2016-04-26: qty 250

## 2016-04-26 MED ORDER — ASPIRIN 300 MG RE SUPP: 300.0000 mg | RECTAL | Status: AC

## 2016-04-26 MED ORDER — LABETALOL HCL 5 MG/ML IV SOLN: 10.0000 mg | INTRAVENOUS | Status: AC | PRN

## 2016-04-26 MED ORDER — MIDAZOLAM HCL 2 MG/2ML IJ SOLN
INTRAMUSCULAR | Status: DC | PRN
  Administered 2016-04-26: 0.5 mg via INTRAVENOUS

## 2016-04-26 MED ORDER — SODIUM CHLORIDE 0.9% FLUSH
3.0000 mL | Freq: Two times a day (BID) | INTRAVENOUS | Status: DC
Start: 2016-04-26 — End: 2016-05-01
  Administered 2016-04-26 – 2016-04-30 (×8): 3 mL via INTRAVENOUS

## 2016-04-26 MED ORDER — LIDOCAINE HCL (PF) 1 % IJ SOLN
INTRAMUSCULAR | Status: AC
  Filled 2016-04-26: qty 30

## 2016-04-26 MED ORDER — HEPARIN SODIUM (PORCINE) 5000 UNIT/ML IJ SOLN: 60.0000 [IU]/kg | Freq: Once | INTRAMUSCULAR | Status: DC

## 2016-04-26 MED ORDER — ONDANSETRON HCL 4 MG/2ML IJ SOLN
4.0000 mg | Freq: Four times a day (QID) | INTRAMUSCULAR | Status: DC | PRN
  Administered 2016-04-27: 4 mg via INTRAVENOUS

## 2016-04-26 MED ORDER — ONDANSETRON HCL 4 MG/2ML IJ SOLN
4.0000 mg | Freq: Four times a day (QID) | INTRAMUSCULAR | Status: DC | PRN
  Filled 2016-04-26: qty 2

## 2016-04-26 MED ORDER — FLUTICASONE PROPIONATE 50 MCG/ACT NA SUSP
2.0000 | Freq: Every day | NASAL | Status: DC | PRN
  Filled 2016-04-26: qty 16

## 2016-04-26 MED ORDER — BIVALIRUDIN BOLUS VIA INFUSION - CUPID
INTRAVENOUS | Status: DC | PRN
  Administered 2016-04-26: 55.8 mg via INTRAVENOUS

## 2016-04-26 MED ORDER — NITROGLYCERIN 1 MG/10 ML FOR IR/CATH LAB
INTRA_ARTERIAL | Status: DC | PRN
  Administered 2016-04-26: 200 ug

## 2016-04-26 MED ORDER — LIDOCAINE HCL (PF) 1 % IJ SOLN
INTRAMUSCULAR | Status: DC | PRN
  Administered 2016-04-26: 18 mL via SUBCUTANEOUS

## 2016-04-26 SURGICAL SUPPLY — 16 items
BALLN MINITREK RX 2.0X12 (BALLOONS) ×3
BALLOON MINITREK RX 2.0X12 (BALLOONS) IMPLANT
CATH INFINITI 5FR ANG PIGTAIL (CATHETERS) ×2 IMPLANT
CATH INFINITI JR4 5F (CATHETERS) ×2 IMPLANT
CATH VISTA GUIDE 6FR XB3 SH (CATHETERS) ×2 IMPLANT
CATH VISTA GUIDE 6FR XB3.5 SH (CATHETERS) ×2 IMPLANT
DEVICE CLOSURE MYNXGRIP 6/7F (Vascular Products) ×2 IMPLANT
DEVICE INFLAT 30 PLUS (MISCELLANEOUS) ×2 IMPLANT
KIT MANI 3VAL PERCEP (MISCELLANEOUS) ×3 IMPLANT
NDL PERC 18GX7CM (NEEDLE) IMPLANT
NEEDLE PERC 18GX7CM (NEEDLE) ×3 IMPLANT
PACK CARDIAC CATH (CUSTOM PROCEDURE TRAY) ×3 IMPLANT
SHEATH AVANTI 6FR X 11CM (SHEATH) ×2 IMPLANT
STENT XIENCE ALPINE RX 2.5X38 (Permanent Stent) ×2 IMPLANT
WIRE ASAHI PROWATER 180CM (WIRE) ×2 IMPLANT
WIRE EMERALD 3MM-J .035X150CM (WIRE) ×2 IMPLANT

## 2016-04-26 NOTE — ED Notes (Signed)
CODE STEMI CALLED TO MARK AT [facility]

## 2016-04-26 NOTE — H&P (Addendum)
Wanda Davenport NAME: Wanda Davenport    MR#:  [id number]  DATE OF BIRTH:  08-13-1923  DATE OF ADMISSION:  04/26/2016  PRIMARY CARE PHYSICIAN: Wanda Dance, DO   REQUESTING/REFERRING PHYSICIAN:   CHIEF COMPLAINT:   Chest pain HISTORY OF PRESENT ILLNESS:  Wanda Davenport  is a 83 y.o. female with a known history of Hypertension, hyperlipidemia, chronic diastolic congestive heart failure sees Wanda Davenport as an outpatient has started experiencing substernal chest pain at around 9 AM today. Pain was 8 out of 10 radiating to her neck and came into the emergency department. EKG has revealed left bundle branch block and new ST elevations in the anterior leads. The patient was given sublingual nitroglycerin and nitroglycerin drip was started. Cardiology was consulted and Dr. Lorinda Davenport date cardiac catheterization which has revealed 100% LAD blockage and patient had one drug-eluting stent placement. Patient tolerated the procedure well and transferred to intensive care unit and hospitalist team is consulted for the admission. During my examination patient is resting comfortably. Denies any chest pain or shortness of breath. Family members at bedside    PAST MEDICAL HISTORY:   Past Medical History:  Diagnosis Date  . Allergic rhinitis   . Anxiety   . Arthritis   . Atypical chest pain   . Bronchitis 01/14/2016  . Chronic diastolic CHF (congestive heart failure) (HCC)    Echo 06/2010 EF 60-65%, no RWMAs, grade 1 diastolic dysfunction, mild LAE, PASP 65mmHg.  Marland Kitchen DIVERTICULOSIS, COLON   . HEMORRHOIDS, INTERNAL   . Hepatic steatosis   . Hyperlipidemia   . HYPERTENSION   . LBBB (left bundle branch block)    a. present on ECG 06/2010  . Osteopenia   . Pre-diabetes    A1c 6.0    PAST SURGICAL HISTOIRY:   Past Surgical History:  Procedure Laterality Date  . ABDOMINAL HYSTERECTOMY    . BREAST SURGERY    . CATARACT EXTRACTION W/PHACO Left  05/12/2014   Procedure: CATARACT EXTRACTION PHACO AND INTRAOCULAR LENS PLACEMENT (IOC);  Surgeon: Wanda Koyanagi, MD;  Location: [facility];  Service: Ophthalmology;  Laterality: Left;  . COLONOSCOPY     a. 2010  . CORONARY STENT INTERVENTION N/A 04/26/2016   Procedure: Coronary Stent Intervention;  Surgeon: Wanda Cowman, MD;  Location: [facility];  Service: Cardiovascular;  Laterality: N/A;  . LEFT HEART CATH AND CORONARY ANGIOGRAPHY N/A 04/26/2016   Procedure: Left Heart Cath and Coronary Angiography;  Surgeon: Wanda Cowman, MD;  Location: [facility];  Service: Cardiovascular;  Laterality: N/A;  . TONSILLECTOMY      SOCIAL HISTORY:   Social History  Substance Use Topics  . Smoking status: Former Smoker    Packs/day: 0.25    Years: 20.00    Types: Cigarettes    Quit date: 01/02/1971  . Smokeless tobacco: Never Used     Comment: smoked a few cigarettes/day x 20 yrs - quit @ age 35.  Marland Kitchen Alcohol use No    FAMILY HISTORY:   Family History  Problem Relation Age of Onset  . Colon cancer Sister   . Melanoma Sister   . Hyperlipidemia Brother   . Hypertension Mother   . Lupus Mother     died 65 from surgical complications  . Alzheimer's disease Brother     unknown  . Kidney disease Father     died 33  . Hypertension Father   . Alcohol abuse Father  DRUG ALLERGIES:   Allergies  Allergen Reactions  . Fenofibrate Other (See Comments)    Myalgias/gi upset   . Statins Other (See Comments)    Myalgias   . Sulfonamide Derivatives Nausea And Vomiting    REACTION: nausea    REVIEW OF SYSTEMS:  CONSTITUTIONAL: No fever, fatigue or weakness.  EYES: No blurred or double vision.  EARS, NOSE, AND THROAT: No tinnitus or ear pain.  RESPIRATORY: No cough, shortness of breath, wheezing or hemoptysis.  CARDIOVASCULAR: No chest pain, orthopnea, edema.  GASTROINTESTINAL: No nausea, vomiting, diarrhea or abdominal pain.  GENITOURINARY: No  dysuria, hematuria.  ENDOCRINE: No polyuria, nocturia,  HEMATOLOGY: No anemia, easy bruising or bleeding SKIN: No rash or lesion. right groin area minimal pain  MUSCULOSKELETAL: No joint pain or arthritis.   NEUROLOGIC: No tingling, numbness, weakness.  PSYCHIATRY: No anxiety or depression.   MEDICATIONS AT HOME:   Prior to Admission medications   Medication Sig Start Date End Date Taking? Authorizing Provider  albuterol (PROVENTIL HFA;VENTOLIN HFA) 108 (90 BASE) MCG/ACT inhaler Inhale 2 puffs into the lungs every 6 (six) hours as needed. Wheezing or shortness of breath   Yes Historical Provider, MD  aspirin 325 MG EC tablet Take 325 mg by mouth daily.   Yes Historical Provider, MD  bisoprolol (ZEBETA) 10 MG tablet Take 1 tablet (10 mg total) by mouth daily. 03/29/16  Yes Wanda Aquas, NP  fluticasone (FLONASE) 50 MCG/ACT nasal spray Place 2 sprays into the nose daily as needed. Nasal congestion Rarely used   Yes Historical Provider, MD  lisinopril (PRINIVIL,ZESTRIL) 20 MG tablet Take 1 tablet by mouth daily. 02/27/16 02/26/17 Yes Historical Provider, MD  montelukast (SINGULAIR) 10 MG tablet Take 1 tablet (10 mg total) by mouth at bedtime. For wheeze during spring and fall 10/31/15  Yes Wanda Opalski, DO  nitroGLYCERIN (NITROSTAT) 0.3 MG SL tablet Place 0.3 mg under the tongue every 5 (five) minutes as needed for chest pain.   Yes Historical Provider, MD  ALPRAZolam Wanda Davenport) 0.25 MG tablet Take 0.25 mg by mouth daily as needed for anxiety.    Historical Provider, MD  Calcium Carbonate-Vitamin D 600-400 MG-UNIT tablet Take 2 tablets by mouth daily. Patient not taking: Reported on 04/26/2016 01/25/16   Wanda Dance, DO  escitalopram (LEXAPRO) 10 MG tablet Take 1 tablet (10 mg total) by mouth daily. Patient not taking: Reported on 04/26/2016 10/31/15   Wanda Dance, DO      VITAL SIGNS:  Blood pressure (!) 160/79, pulse (!) 57, temperature 97.5 F (36.4 C), temperature source Oral, resp.  rate 14, height 5\' 4"  (1.626 m), weight 74.4 kg (164 lb), SpO2 94 %.  PHYSICAL EXAMINATION:  GENERAL:  83 y.o.-year-old patient lying in the bed with no acute distress.  EYES: Pupils equal, round, reactive to light and accommodation. No scleral icterus. Extraocular muscles intact.  HEENT: Head atraumatic, normocephalic. Oropharynx and nasopharynx clear.  NECK:  Supple, no jugular venous distention. No thyroid enlargement, no tenderness.  LUNGS: Normal breath sounds bilaterally, no wheezing, rales,rhonchi or crepitation. No use of accessory muscles of respiration.  CARDIOVASCULAR: S1, S2 normal. No murmurs, rubs, or gallops.  ABDOMEN: Soft, nontender, nondistended. Bowel sounds present. No organomegaly or mass.  EXTREMITIES: Right inguinal area cath Site with clear dressing No pedal edema, cyanosis, or clubbing.  NEUROLOGIC: Cranial nerves II through XII are intact. Muscle strength 5/5 in all extremities. Sensation intact. Gait not checked.  PSYCHIATRIC: The patient is alert and oriented x 3.  SKIN:  No obvious rash, lesion, or ulcer.   LABORATORY PANEL:   CBC  Recent Labs Lab 04/26/16 1034  WBC 14.0*  HGB 13.2  HCT 38.7  PLT 257   ------------------------------------------------------------------------------------------------------------------  Chemistries   Recent Labs Lab 04/26/16 1034  NA 137  K 4.4  CL 109  CO2 21*  GLUCOSE 144*  BUN 30*  CREATININE 0.89  CALCIUM 9.7   ------------------------------------------------------------------------------------------------------------------  Cardiac Enzymes  Recent Labs Lab 04/26/16 1034  TROPONINI <0.03   ------------------------------------------------------------------------------------------------------------------  RADIOLOGY:  Dg Chest Port 1 View  Result Date: 04/26/2016 CLINICAL DATA:  Chest pain EXAM: PORTABLE CHEST 1 VIEW COMPARISON:  09/23/2011 FINDINGS: The heart size and mediastinal contours are within  normal limits. Both lungs are clear. The visualized skeletal structures are unremarkable. IMPRESSION: No active disease. Electronically Signed   By: Inez Catalina M.D.   On: 04/26/2016 10:32    EKG:   Orders placed or performed during the hospital encounter of 04/26/16  . ED EKG  . ED EKG  . EKG 12-Lead  . EKG 12-Lead  . EKG 12-Lead immediately post procedure  . EKG 12-Lead  . EKG 12-Lead immediately post procedure  . EKG 12-Lead  . EKG 12-Lead    IMPRESSION AND PLAN:   83 year old female presenting to the ED with a chief complaint of chest pain diagnosed with a STEMI and had cardiac catheterization 1 drug-eluting stent placement  # STEMI-status post cardiac catheterization 100% LAD blockage at 1 drug-eluting stent placement. Continue nitroglycerin drip, aspirin 81 mg, patient is started on Brilinta. Post catheter management by cardiology  #Chronic systolic congestive heart failure not fluid overloaded at this time, monitor daily intake and output, monitor for symptoms and signs of fluid overload #essential hypertension continue home medication lisinopril. His blood pressure is stable will consider adding small dose beta blocker #Hyperlipidemia check fasting lipid panel and statin #Allergic rhinitis Singulair and Flonase #Borderline diabetes mellitus insulin sliding scale and diabetic diet        All the records are reviewed and case discussed with ED provider. Management plans discussed with the patient, family and they are in agreement.  CODE STATUS: Full code, son and daughter is the healthcare power of attorney   TOTAL critical care TIME TAKING CARE OF THIS PATIENT: 45 minutes.   Note: This dictation was prepared with Dragon dictation along with smaller phrase technology. Any transcriptional errors that result from this process are unintentional.  Nicholes Mango M.D on 04/26/2016 at 2:41 PM  Between 7am to 6pm - Pager - [phone]  After 6pm go to [email or web address] -  password [email or web address] [facility]  Office  [phone]  CC: Primary care physician; Wanda Dance, DO

## 2016-04-26 NOTE — Progress Notes (Signed)
[place] responded to a page for Code STEMI of a Pt in ED 25. [place] met Pt. Pt's son, daughter, and grandson were at bedside. Pt alert and responding to questions. [place] services not needed at present.     04/26/16 1200  Clinical Encounter Type  Visited With Patient;Patient and family together  Visit Type Initial;Spiritual support;Code;Trauma  Referral From Nurse  Consult/Referral To Chaplain  Spiritual Encounters  Spiritual Needs Ritual;Emotional;Other (Comment)

## 2016-04-26 NOTE — Progress Notes (Signed)
ANTICOAGULATION CONSULT NOTE - Initial Consult  Pharmacy Consult for Heparin Drip Indication: chest pain/ACS  Allergies  Allergen Reactions  . Fenofibrate Other (See Comments)    Myalgias/gi upset   . Statins Other (See Comments)    Myalgias   . Sulfonamide Derivatives Nausea And Vomiting    REACTION: nausea    Patient Measurements: Height: 5\' 4"  (162.6 cm) Weight: 164 lb (74.4 kg) IBW/kg (Calculated) : 54.7 Heparin Dosing Weight: 70kg  Vital Signs: Temp: 97.5 F (36.4 C) (04/26 1027) Temp Source: Oral (04/26 1027) BP: 159/81 (04/26 1027) Pulse Rate: 54 (04/26 1027)  Labs:  Recent Labs  04/26/16 1034  HGB 13.2  HCT 38.7  PLT 257  LABPROT 12.5  INR 0.93    CrCl cannot be calculated (Patient's most recent lab result is older than the maximum 21 days allowed.).   Medical History: Past Medical History:  Diagnosis Date  . Allergic rhinitis   . Anxiety   . Arthritis   . Atypical chest pain   . Bronchitis 01/14/2016  . Chronic diastolic CHF (congestive heart failure) (HCC)    Echo 06/2010 EF 60-65%, no RWMAs, grade 1 diastolic dysfunction, mild LAE, PASP 25mmHg.  Marland Kitchen DIVERTICULOSIS, COLON   . HEMORRHOIDS, INTERNAL   . Hepatic steatosis   . Hyperlipidemia   . HYPERTENSION   . LBBB (left bundle branch block)    a. present on ECG 06/2010  . Osteopenia   . Pre-diabetes    A1c 6.0    Medications:  Scheduled:  . heparin  4,000 Units Intravenous Once   Infusions:  . heparin 800 Units/hr (04/26/16 1055)    Assessment: Pharmacy consulted to dose Heparin for this 83 y.o. female who presents to the ED for chest pain.   Goal of Therapy:  Heparin level 0.3-0.7 units/ml Monitor platelets by anticoagulation protocol: Yes   Plan:  Give 4000 units bolus x 1 Start heparin infusion at 800 units/hr Check anti-Xa level in 8 hours and daily while on heparin Continue to monitor H&H and platelets   HL ordered for 4/26 at 19:00.  Olivia Canter,  [facility] Clinical Pharmacist 04/26/2016,11:04 AM

## 2016-04-26 NOTE — ED Notes (Signed)
Pt undressed, consent signed, en route to cath lab; belongings given to daughter (includes clothing, jewelry, hearing aid)

## 2016-04-26 NOTE — Progress Notes (Signed)
Patient alert and oriented- vitals stable- patient had frequent runcs of PVCs with BBB- Marda Stalker, NP and Dr. Josefa Half made aware- EKG ordered showing afib with PVC's- mag and phosphorus checked which were normal- no new orders.  Patient sitting up- eating and right groin site showing no signs and symptoms of bleeding or hematoma.  Patient having no pain.

## 2016-04-26 NOTE — ED Provider Notes (Signed)
[facility] Emergency Department Provider Note       Time seen: ----------------------------------------- 10:18 AM on 04/26/2016 -----------------------------------------     I have reviewed the triage vital signs and the nursing notes.   HISTORY   Chief Complaint No chief complaint on file.    HPI Wanda Davenport is a 83 y.o. female who presents to the ED for chest pain. Patient states chest pain started in the lower chest and radiated across her chest. She presents with nausea and dry heaves. She denies sweats or shortness of breath. Patient was given 3 nitroglycerin sprays prior to arrival which improved her pain some but then the pain seemed to recur. Onset of pain was about 1 hour ago.   Past Medical History:  Diagnosis Date  . Allergic rhinitis   . Anxiety   . Arthritis   . Atypical chest pain   . Bronchitis 01/14/2016  . Chronic diastolic CHF (congestive heart failure) (HCC)    Echo 06/2010 EF 60-65%, no RWMAs, grade 1 diastolic dysfunction, mild LAE, PASP 33mmHg.  Marland Kitchen DIVERTICULOSIS, COLON   . HEMORRHOIDS, INTERNAL   . Hepatic steatosis   . Hyperlipidemia   . HYPERTENSION   . LBBB (left bundle branch block)    a. present on ECG 06/2010  . Osteopenia   . Pre-diabetes    A1c 6.0    Patient Active Problem List   Diagnosis Date Noted  . Knee pain, bilateral 04/10/2016  . Medication monitoring encounter 01/26/2016  . Intolerance of drug- statins 01/25/2016  . Counseling regarding end of life decision making 01/25/2016  . Hypertriglyceridemia 12/13/2015  . Low serum HDL 12/13/2015  . B12 deficiency 12/13/2015  . Claudication of left lower extremity ([place]) 12/13/2015  . Vitamin D deficiency 11/06/2015  . Adjustment disorder with mixed anxiety and depressed mood 11/06/2015  . Basal cell carcinoma 10/31/2015  . Environmental and seasonal allergies 10/31/2015  . Reactive airway disease- only spring and fall due to allergies 10/31/2015  .  GAD (generalized anxiety disorder) 10/13/2015  . Atypical chest pain 04/16/2011  . Chronic diastolic heart failure ([place]) 04/16/2011  . Hyperlipidemia   . Pre-diabetes   . chronic LBBB (left bundle branch block)- since 2012   . Generalized OA   . Allergic rhinitis   . DIVERTICULOSIS, COLON 12/03/2007  . Hypertension with CHF ( diastolic dysf grade I)  74/08/1446    Past Surgical History:  Procedure Laterality Date  . ABDOMINAL HYSTERECTOMY    . BREAST SURGERY    . CATARACT EXTRACTION W/PHACO Left 05/12/2014   Procedure: CATARACT EXTRACTION PHACO AND INTRAOCULAR LENS PLACEMENT (IOC);  Surgeon: Leandrew Koyanagi, MD;  Location: [facility];  Service: Ophthalmology;  Laterality: Left;  . COLONOSCOPY     a. 2010  . TONSILLECTOMY      Allergies Fenofibrate; Statins; and Sulfonamide derivatives  Social History Social History  Substance Use Topics  . Smoking status: Former Smoker    Packs/day: 0.25    Years: 20.00    Types: Cigarettes    Quit date: 01/02/1971  . Smokeless tobacco: Never Used     Comment: smoked a few cigarettes/day x 20 yrs - quit @ age 36.  Marland Kitchen Alcohol use No    Review of Systems Constitutional: Negative for fever. Eyes: Negative for vision changes ENT:  Negative for congestion, sore throat Cardiovascular: Positive for chest pain Respiratory: Negative for shortness of breath. Gastrointestinal: Negative for abdominal pain, positive for nausea and vomiting Genitourinary: Negative for dysuria. Musculoskeletal:  Negative for back pain. Skin: Negative for rash. Neurological: Negative for headaches, focal weakness or numbness.  All systems negative/normal/unremarkable except as stated in the HPI  ____________________________________________   PHYSICAL EXAM:  VITAL SIGNS: ED Triage Vitals  Enc Vitals Group     BP      Pulse      Resp      Temp      Temp src      SpO2      Weight      Height      Head Circumference      Peak Flow      Pain  Score      Pain Loc      Pain Edu?      Excl. in [place]?     Constitutional: Alert and oriented. Mild to moderate distress Eyes: Conjunctivae are normal. PERRL. Normal extraocular movements. ENT   Head: Normocephalic and atraumatic.   Nose: No congestion/rhinnorhea.   Mouth/Throat: Mucous membranes are moist.   Neck: No stridor. Cardiovascular: Normal rate, regular rhythm. No murmurs, rubs, or gallops. Respiratory: Normal respiratory effort without tachypnea nor retractions. Breath sounds are clear and equal bilaterally. No wheezes/rales/rhonchi. Gastrointestinal: Soft and nontender. Normal bowel sounds Musculoskeletal: Nontender with normal range of motion in extremities. No lower extremity tenderness nor edema. Neurologic:  Normal speech and language. No gross focal neurologic deficits are appreciated.  Skin:  Skin is warm, dry and intact. No rash noted. Psychiatric: Mood and affect are normal. Speech and behavior are normal.  ____________________________________________  EKG: Interpreted by me. Apparent sinus rhythm with very prolonged PR interval, wide QRS, long QT, PVC, left axis deviation, left bundle branch block  Repeat EKG interpreted by me reveals sinus rhythm with prolonged PR interval, ischemic ST depression inferiorly and also in V5 and V6, isolated ST elevation in leads aVL and V2. ____________________________________________  ED COURSE:  Pertinent labs & imaging results that were available during my care of the patient were reviewed by me and considered in my medical decision making (see chart for details). Patient presents for severe chest pain, we will assess with labs and imaging as indicated. Clinical Course as of Apr 26 1041  Thu Apr 26, 2016  1042 Patient's EKG is borderline for STEMI criteria, we will go ahead and activate.  [JW]    Clinical Course User Index [JW] Earleen Newport, MD   Procedures ____________________________________________    LABS (pertinent positives/negatives)  Labs Reviewed  BASIC METABOLIC PANEL  CBC  TROPONIN I  PROTIME-INR  APTT   CRITICAL CARE Performed by: Earleen Newport   Total critical care time: 30 minutes  Critical care time was exclusive of separately billable procedures and treating other patients.  Critical care was necessary to treat or prevent imminent or life-threatening deterioration.  Critical care was time spent personally by me on the following activities: development of treatment plan with patient and/or surrogate as well as nursing, discussions with consultants, evaluation of patient's response to treatment, examination of patient, obtaining history from patient or surrogate, ordering and performing treatments and interventions, ordering and review of laboratory studies, ordering and review of radiographic studies, pulse oximetry and re-evaluation of patient's condition.  RADIOLOGY  Chest x-ray Is unremarkable ____________________________________________  FINAL ASSESSMENT AND PLAN  Chest pain, unstable angina with ischemic EKG changes  Plan: Patient's labs and imaging were dictated above. Patient had presented for chest pain with classic symptoms for unstable angina. EKG is concerning for ischemia with inferior ST  depressions and some isolated ST elevation. Patient will be placed on a heparin drip as well as nitroglycerin drip for chest pain. I discussed with cardiology on call who will come and evaluate the patient the ER.   Earleen Newport, MD   Note: This note was generated in part or whole with voice recognition software. Voice recognition is usually quite accurate but there are transcription errors that can and very often do occur. I apologize for any typographical errors that were not detected and corrected.     Earleen Newport, MD 04/26/16 1045

## 2016-04-26 NOTE — Consult Note (Signed)
Name: Wanda Davenport MRN: [id number] DOB: 1923/07/18    ADMISSION DATE:  04/26/2016 CONSULTATION DATE: 04/26/2016  REFERRING MD :  Dr. Saralyn Pilar  CHIEF COMPLAINT:  STEMI  BRIEF PATIENT DESCRIPTION:  83 yo female who presented to [facility] ER 04/26 with 8/10 substernal chest pain EKG revealed STEMI pt taken to cardiac catheterization lab which revealed 100% LAD occlusion with placement of drug eluting stent  SIGNIFICANT EVENTS  04/26-Pt admitted to ICU s/p cardiac catheterization   STUDIES:  Cardiac Catheterization 04/26>>Anterior ST elevation myocardial infarction, 100% occlusion proximal LAD Successful primary PCI with DES proximal LAD.  Moderate reduced left ventricular function with LVEF 35%  HISTORY OF PRESENT ILLNESS:   This is a 83 yo female with a PMH of Prediabetes, Osteopenia, Left Bundle Branch Block, HTN, Hyperlipidemia, Hepatic steatosis, Diverticulosis, Chronic Systolic CHF, Bronchitis, Atypical Chest Pain, Arthritis, Anxiety, and Allergic Rhinitis  She presented to [facility] ER 04/26 with c/o 8/10 substernal chest pain with radiation to her neck, onset the morning of 04/26. Per ER notes EKG revealed baseline left bundle branch block, with new ST elevation in the anterior leads.  The pt was given sublingual nitroglycerin and started on a nitroglycerin gtt.  Cardiology consulted and pt taken to the cardiac catheterization lab, which revealed 100% occlusion in the proximal LAD, therefore a drug eluting stent was placed by Dr. Saralyn Pilar.  The pt was subsequently admitted to ICU for further management and treatment.    PAST MEDICAL HISTORY :   has a past medical history of Allergic rhinitis; Anxiety; Arthritis; Atypical chest pain; Bronchitis (01/14/2016); Chronic diastolic CHF (congestive heart failure) ([place]); DIVERTICULOSIS, COLON; HEMORRHOIDS, INTERNAL; Hepatic steatosis; Hyperlipidemia; HYPERTENSION; LBBB (left bundle branch block); Osteopenia; and Pre-diabetes.  has a past surgical  history that includes Breast surgery; Tonsillectomy; Abdominal hysterectomy; Colonoscopy; Cataract extraction w/PHACO (Left, 05/12/2014); Left Heart Cath and Coronary Angiography (N/A, 04/26/2016); and Coronary Stent Intervention (N/A, 04/26/2016). Prior to Admission medications   Medication Sig Start Date End Date Taking? Authorizing Provider  albuterol (PROVENTIL HFA;VENTOLIN HFA) 108 (90 BASE) MCG/ACT inhaler Inhale 2 puffs into the lungs every 6 (six) hours as needed. Wheezing or shortness of breath   Yes Historical Provider, MD  aspirin 325 MG EC tablet Take 325 mg by mouth daily.   Yes Historical Provider, MD  bisoprolol (ZEBETA) 10 MG tablet Take 1 tablet (10 mg total) by mouth daily. 03/29/16  Yes Odella Aquas, NP  fluticasone (FLONASE) 50 MCG/ACT nasal spray Place 2 sprays into the nose daily as needed. Nasal congestion Rarely used   Yes Historical Provider, MD  lisinopril (PRINIVIL,ZESTRIL) 20 MG tablet Take 1 tablet by mouth daily. 02/27/16 02/26/17 Yes Historical Provider, MD  montelukast (SINGULAIR) 10 MG tablet Take 1 tablet (10 mg total) by mouth at bedtime. For wheeze during spring and fall 10/31/15  Yes Deborah Opalski, DO  nitroGLYCERIN (NITROSTAT) 0.3 MG SL tablet Place 0.3 mg under the tongue every 5 (five) minutes as needed for chest pain.   Yes Historical Provider, MD  ALPRAZolam Duanne Moron) 0.25 MG tablet Take 0.25 mg by mouth daily as needed for anxiety.    Historical Provider, MD  Calcium Carbonate-Vitamin D 600-400 MG-UNIT tablet Take 2 tablets by mouth daily. Patient not taking: Reported on 04/26/2016 01/25/16   Mellody Dance, DO  escitalopram (LEXAPRO) 10 MG tablet Take 1 tablet (10 mg total) by mouth daily. Patient not taking: Reported on 04/26/2016 10/31/15   Mellody Dance, DO   Allergies  Allergen Reactions  . Fenofibrate  Other (See Comments)    Myalgias/gi upset   . Statins Other (See Comments)    Myalgias   . Sulfonamide Derivatives Nausea And Vomiting    REACTION:  nausea    FAMILY HISTORY:  family history includes Alcohol abuse in her father; Alzheimer's disease in her brother; Colon cancer in her sister; Hyperlipidemia in her brother; Hypertension in her father and mother; Kidney disease in her father; Lupus in her mother; Melanoma in her sister. SOCIAL HISTORY:  reports that she quit smoking about 45 years ago. Her smoking use included Cigarettes. She has a 5.00 pack-year smoking history. She has never used smokeless tobacco. She reports that she does not drink alcohol or use drugs.  REVIEW OF SYSTEMS:  Positives in BOLD Constitutional: Negative for fever, chills, weight loss, malaise/fatigue and diaphoresis.  HENT: Negative for hearing loss, ear pain, nosebleeds, congestion, sore throat, neck pain, tinnitus and ear discharge.   Eyes: Negative for blurred vision, double vision, photophobia, pain, discharge and redness.  Respiratory: Negative for cough, hemoptysis, sputum production, shortness of breath, wheezing and stridor.   Cardiovascular: chest pain, palpitations, orthopnea, claudication, leg swelling and PND.  Gastrointestinal: heartburn, nausea, vomiting, abdominal pain, diarrhea, constipation, blood in stool and melena.  Genitourinary: Negative for dysuria, urgency, frequency, hematuria and flank pain.  Musculoskeletal: Negative for myalgias, back pain, joint pain and falls.  Skin: Negative for itching and rash.  Neurological: Negative for dizziness, tingling, tremors, sensory change, speech change, focal weakness, seizures, loss of consciousness, weakness and headaches.  Endo/Heme/Allergies: Negative for environmental allergies and polydipsia. Does not bruise/bleed easily.  SUBJECTIVE:  No new complaints at this time she denies chest pain.  VITAL SIGNS: Temp:  [97.5 F (36.4 C)] 97.5 F (36.4 C) (04/26 1027) Pulse Rate:  [54-59] 57 (04/26 1113) Resp:  [8-17] 14 (04/26 1120) BP: (159-160)/(76-81) 160/79 (04/26 1100) SpO2:  [92 %-98 %]  94 % (04/26 1128) Weight:  [74.4 kg (164 lb)] 74.4 kg (164 lb) (04/26 1027)  PHYSICAL EXAMINATION: General: well developed, well nourished Caucasian female  Neuro: alert and oriented, following commands HEENT: supple, no JVD Cardiovascular: irregular irregular, no M/R/G Lungs: clear throughout, even, non labored Abdomen: +BS x4, soft, non tender, non distended, obese Musculoskeletal: normal bulk and tone, no edema  Skin: intact, right groin mynx closed site dressing dry and intact    Recent Labs Lab 04/26/16 1034  NA 137  K 4.4  CL 109  CO2 21*  BUN 30*  CREATININE 0.89  GLUCOSE 144*    Recent Labs Lab 04/26/16 1034  HGB 13.2  HCT 38.7  WBC 14.0*  PLT 257   Dg Chest Port 1 View  Result Date: 04/26/2016 CLINICAL DATA:  Chest pain EXAM: PORTABLE CHEST 1 VIEW COMPARISON:  09/23/2011 FINDINGS: The heart size and mediastinal contours are within normal limits. Both lungs are clear. The visualized skeletal structures are unremarkable. IMPRESSION: No active disease. Electronically Signed   By: Inez Catalina M.D.   On: 04/26/2016 10:32    ASSESSMENT / PLAN: STEMI-cardiac catheterization revealing 100% LAD occlusion drug eluting stent placement 04/26 Hypertension  New Onset Atrial fibrillation with Left Bundle Branch Block-rate controlled Hx: Hyperlipidemia, Chronic systolic CHF, Prediabetes, and Anxiety P: Cardiology consulted appreciate input Supplemental O2 to maintain O2 sats >92% or for dyspnea  Aspirin and brilinta Prn labetalol and hydralazine for hypertension Continuous telemetry monitoring Lipid panel and hemoglobin A1C in the am  Prn morphine for pain management  Troponin post cath   Marda Stalker, AGNP  Pulmonary/Critical Care Pager [phone] (please enter 7 digits) PCCM Consult Pager [phone] (please enter 7 digits)

## 2016-04-26 NOTE — Consult Note (Signed)
[facility] Cardiology  CARDIOLOGY CONSULT NOTE  Patient ID: Wanda Davenport MRN: [id number] DOB/AGE: September 04, 1923 83 y.o.  Admit date: 04/26/2016 Referring Physician Jimmye Norman Primary Physician  Primary Cardiologist Nehemiah Massed Reason for Consultation Anterior ST elevation myocardial infarction  HPI: 83 year old female referred for evaluation of chest pain and abnormal ECG. The patient was in her usual state of health until early this morning when she experienced substernal chest pain, rated 8 out of 10, with radiation to her neck. She presented to [facility] emergency room, ECG revealed baseline left bundle-branch block, with apparent new ST elevations noted in the anterior leads. The patient was treated with sublingual nitroglycerin, nitroglycerin drip with overall modest improvement.  Review of systems complete and found to be negative unless listed above     Past Medical History:  Diagnosis Date  . Allergic rhinitis   . Anxiety   . Arthritis   . Atypical chest pain   . Bronchitis 01/14/2016  . Chronic diastolic CHF (congestive heart failure) (HCC)    Echo 06/2010 EF 60-65%, no RWMAs, grade 1 diastolic dysfunction, mild LAE, PASP 46mmHg.  Marland Kitchen DIVERTICULOSIS, COLON   . HEMORRHOIDS, INTERNAL   . Hepatic steatosis   . Hyperlipidemia   . HYPERTENSION   . LBBB (left bundle branch block)    a. present on ECG 06/2010  . Osteopenia   . Pre-diabetes    A1c 6.0    Past Surgical History:  Procedure Laterality Date  . ABDOMINAL HYSTERECTOMY    . BREAST SURGERY    . CATARACT EXTRACTION W/PHACO Left 05/12/2014   Procedure: CATARACT EXTRACTION PHACO AND INTRAOCULAR LENS PLACEMENT (IOC);  Surgeon: Leandrew Koyanagi, MD;  Location: [place];  Service: Ophthalmology;  Laterality: Left;  . COLONOSCOPY     a. 2010  . TONSILLECTOMY      Prescriptions Prior to Admission  Medication Sig Dispense Refill Last Dose  . albuterol (PROVENTIL HFA;VENTOLIN HFA) 108 (90 BASE) MCG/ACT inhaler Inhale 2 puffs  into the lungs every 6 (six) hours as needed. Wheezing or shortness of breath   prn at prn  . aspirin 325 MG EC tablet Take 325 mg by mouth daily.   04/26/2016 at 0900  . bisoprolol (ZEBETA) 10 MG tablet Take 1 tablet (10 mg total) by mouth daily. 90 tablet 0 04/26/2016 at 0800  . fluticasone (FLONASE) 50 MCG/ACT nasal spray Place 2 sprays into the nose daily as needed. Nasal congestion Rarely used   prn at prn  . lisinopril (PRINIVIL,ZESTRIL) 20 MG tablet Take 1 tablet by mouth daily.   04/26/2016 at 0800  . montelukast (SINGULAIR) 10 MG tablet Take 1 tablet (10 mg total) by mouth at bedtime. For wheeze during spring and fall 90 tablet 1 04/25/2016 at pm  . nitroGLYCERIN (NITROSTAT) 0.3 MG SL tablet Place 0.3 mg under the tongue every 5 (five) minutes as needed for chest pain.   04/26/2016 at 0900  . ALPRAZolam (XANAX) 0.25 MG tablet Take 0.25 mg by mouth daily as needed for anxiety.   Not Taking at Unknown time  . Calcium Carbonate-Vitamin D 600-400 MG-UNIT tablet Take 2 tablets by mouth daily. (Patient not taking: Reported on 04/26/2016) 60 tablet 11 Not Taking at Unknown time  . escitalopram (LEXAPRO) 10 MG tablet Take 1 tablet (10 mg total) by mouth daily. (Patient not taking: Reported on 04/26/2016) 90 tablet 1 Not Taking at Unknown time   Social History   Social History  . Marital status: Widowed    Spouse name: N/A  .  Number of children: N/A  . Years of education: N/A   Occupational History  . Retired    Social History Main Topics  . Smoking status: Former Smoker    Packs/day: 0.25    Years: 20.00    Types: Cigarettes    Quit date: 01/02/1971  . Smokeless tobacco: Never Used     Comment: smoked a few cigarettes/day x 20 yrs - quit @ age 52.  Marland Kitchen Alcohol use No  . Drug use: No  . Sexual activity: Not Currently   Other Topics Concern  . Not on file   Social History Narrative   Lives in [place] by herself.  She has family near-by.  She tries to stay active around the house - does all  of her own housework and still mows (rides) her yard.      Daily caffeine     Family History  Problem Relation Age of Onset  . Colon cancer Sister   . Melanoma Sister   . Hyperlipidemia Brother   . Hypertension Mother   . Lupus Mother     died 61 from surgical complications  . Alzheimer's disease Brother     unknown  . Kidney disease Father     died 36  . Hypertension Father   . Alcohol abuse Father       Review of systems complete and found to be negative unless listed above      PHYSICAL EXAM  General: Well developed, well nourished, in no acute distress HEENT:  Normocephalic and atramatic Neck:  No JVD.  Lungs: Clear bilaterally to auscultation and percussion. Heart: HRRR . Normal S1 and S2 without gallops or murmurs.  Abdomen: Bowel sounds are positive, abdomen soft and non-tender  Msk:  Back normal, normal gait. Normal strength and tone for age. Extremities: No clubbing, cyanosis or edema.   Neuro: Alert and oriented X 3. Psych:  Good affect, responds appropriately  Labs:   Lab Results  Component Value Date   WBC 14.0 (H) 04/26/2016   HGB 13.2 04/26/2016   HCT 38.7 04/26/2016   MCV 86.4 04/26/2016   PLT 257 04/26/2016    Recent Labs Lab 04/26/16 1034  NA 137  K 4.4  CL 109  CO2 21*  BUN 30*  CREATININE 0.89  CALCIUM 9.7  GLUCOSE 144*   Lab Results  Component Value Date   CKTOTAL 24 09/24/2011   CKMB 1.6 09/24/2011   TROPONINI <0.03 04/26/2016    Lab Results  Component Value Date   CHOL 280 (H) 10/31/2015   CHOL 282 (A) 05/06/2013   CHOL 289 (A) 11/03/2012   Lab Results  Component Value Date   HDL 42 (L) 10/31/2015   HDL 49 05/06/2013   HDL 51 11/03/2012   Lab Results  Component Value Date   LDLCALC 187 (H) 10/31/2015   LDLCALC 198 05/06/2013   LDLCALC 201 11/03/2012   Lab Results  Component Value Date   TRIG 255 (H) 10/31/2015   TRIG 175 (A) 05/06/2013   TRIG 184 (A) 11/03/2012   Lab Results  Component Value Date    CHOLHDL 6.7 (H) 10/31/2015   CHOLHDL 5.4 09/25/2011   No results found for: LDLDIRECT    Radiology: Dg Chest Port 1 View  Result Date: 04/26/2016 CLINICAL DATA:  Chest pain EXAM: PORTABLE CHEST 1 VIEW COMPARISON:  09/23/2011 FINDINGS: The heart size and mediastinal contours are within normal limits. Both lungs are clear. The visualized skeletal structures are unremarkable. IMPRESSION: No active disease.  Electronically Signed   By: Inez Catalina M.D.   On: 04/26/2016 10:32    EKG: Sinus rhythm with left bundle branch block and diagnostic ST elevation in the precordial leads  ASSESSMENT AND PLAN:   1. New onset, prolonged chest pain, with ECG suggestive of anterior ST elevation myocardial infarction  Recommendations  1. Emergent cardiac catheterization, and possible PCI. The risks, benefits and alternatives of cardiac catheterization and PCI were explained to the patient and informed written consent was obtained.  Signed: Isaias Cowman MD,PhD, [facility] 04/26/2016, 12:47 PM

## 2016-04-26 NOTE — ED Triage Notes (Signed)
Pt via ems from home with chest pain since 9am. Pt states she was sitting on the couch when pain began. EMS reports HR in 70's, which decreased to 40 upon arrival to ED. EMS gave 3 x nitro as well as 325 aspirin. Pt began feeling nauseous upon arrival as well. Dr. Jimmye Norman verbally ordered 4mg  zofran and 41ml morphine for pt. Pt alert & oriented; obvious diaphoresis and pain.

## 2016-04-27 ENCOUNTER — Other Ambulatory Visit: Payer: Self-pay | Admitting: Family Medicine

## 2016-04-27 DIAGNOSIS — F419 Anxiety disorder, unspecified: Secondary | ICD-10-CM

## 2016-04-27 LAB — CBC
HCT: 37.1 % (ref 35.0–47.0)
Hemoglobin: 12.5 g/dL (ref 12.0–16.0)
MCH: 29.6 pg (ref 26.0–34.0)
MCHC: 33.6 g/dL (ref 32.0–36.0)
MCV: 88.1 fL (ref 80.0–100.0)
Platelets: 173 10*3/uL (ref 150–440)
RBC: 4.22 MIL/uL (ref 3.80–5.20)
RDW: 16 % — AB (ref 11.5–14.5)
WBC: 10.9 10*3/uL (ref 3.6–11.0)

## 2016-04-27 LAB — LIPID PANEL
CHOLESTEROL: 218 mg/dL — AB (ref 0–200)
HDL: 44 mg/dL (ref >40)
LDL CALC: 146 mg/dL — AB (ref 0–99)
TRIGLYCERIDES: 141 mg/dL (ref <150)
Total CHOL/HDL Ratio: 5 RATIO
VLDL: 28 mg/dL (ref 0–40)

## 2016-04-27 LAB — BASIC METABOLIC PANEL
ANION GAP: 5 (ref 5–15)
BUN: 25 mg/dL — ABNORMAL HIGH (ref 6–20)
CALCIUM: 9.6 mg/dL (ref 8.9–10.3)
CO2: 21 mmol/L — ABNORMAL LOW (ref 22–32)
CREATININE: 0.8 mg/dL (ref 0.44–1.00)
Chloride: 112 mmol/L — ABNORMAL HIGH (ref 101–111)
Glucose, Bld: 119 mg/dL — ABNORMAL HIGH (ref 65–99)
Potassium: 4.4 mmol/L (ref 3.5–5.1)
SODIUM: 138 mmol/L (ref 135–145)

## 2016-04-27 LAB — ECHOCARDIOGRAM COMPLETE
HEIGHTINCHES: 64 in
WEIGHTICAEL: 2624 [oz_av]

## 2016-04-27 LAB — TROPONIN I: Troponin I: >65 ng/mL (ref <0.03)

## 2016-04-27 LAB — POCT ACTIVATED CLOTTING TIME: Activated Clotting Time: 125 seconds

## 2016-04-27 LAB — MAGNESIUM: Magnesium: 2 mg/dL (ref 1.7–2.4)

## 2016-04-27 MED ORDER — ISOSORBIDE MONONITRATE ER 30 MG PO TB24
30.0000 mg | ORAL_TABLET | Freq: Every day | ORAL | Status: DC
  Administered 2016-04-27 – 2016-04-30 (×4): 30 mg via ORAL
  Filled 2016-04-27 (×4): qty 1

## 2016-04-27 NOTE — Progress Notes (Signed)
[facility] Cardiology  SUBJECTIVE: I feel better   Vitals:   04/27/16 0600 04/27/16 0700 04/27/16 0741 04/27/16 0748  BP: (!) 142/66 140/68 136/68   Pulse: 61 70 71 71  Resp: 14 20 18 19   Temp:    97.8 F (36.6 C)  TempSrc:    Oral  SpO2: 97% 98% 98% 99%  Weight:      Height:         Intake/Output Summary (Last 24 hours) at 04/27/16 0830 Last data filed at 04/27/16 0436  Gross per 24 hour  Intake           670.81 ml  Output              500 ml  Net           170.81 ml      PHYSICAL EXAM  General: Well developed, well nourished, in no acute distress HEENT:  Normocephalic and atramatic Neck:  No JVD.  Lungs: Clear bilaterally to auscultation and percussion. Heart: HRRR . Normal S1 and S2 without gallops or murmurs.  Abdomen: Bowel sounds are positive, abdomen soft and non-tender  Msk:  Back normal, normal gait. Normal strength and tone for age. Extremities: No clubbing, cyanosis or edema.   Neuro: Alert and oriented X 3. Psych:  Good affect, responds appropriately   LABS: Basic Metabolic Panel:  Recent Labs  04/26/16 1034 04/27/16 0420  NA 137 138  K 4.4 4.4  CL 109 112*  CO2 21* 21*  GLUCOSE 144* 119*  BUN 30* 25*  CREATININE 0.89 0.80  CALCIUM 9.7 9.6  MG 2.0 2.0  PHOS 2.5  --    Liver Function Tests: No results for input(s): AST, ALT, ALKPHOS, BILITOT, PROT, ALBUMIN in the last 72 hours. No results for input(s): LIPASE, AMYLASE in the last 72 hours. CBC:  Recent Labs  04/26/16 1034 04/27/16 0420  WBC 14.0* 10.9  HGB 13.2 12.5  HCT 38.7 37.1  MCV 86.4 88.1  PLT 257 173   Cardiac Enzymes:  Recent Labs  04/26/16 1034  TROPONINI <0.03   BNP: Invalid input(s): POCBNP D-Dimer: No results for input(s): DDIMER in the last 72 hours. Hemoglobin A1C: No results for input(s): HGBA1C in the last 72 hours. Fasting Lipid Panel:  Recent Labs  04/27/16 0420  CHOL 218*  HDL 44  LDLCALC 146*  TRIG 141  CHOLHDL 5.0   Thyroid Function Tests: No  results for input(s): TSH, T4TOTAL, T3FREE, THYROIDAB in the last 72 hours.  Invalid input(s): FREET3 Anemia Panel: No results for input(s): VITAMINB12, FOLATE, FERRITIN, TIBC, IRON, RETICCTPCT in the last 72 hours.  Dg Chest Port 1 View  Result Date: 04/26/2016 CLINICAL DATA:  Chest pain EXAM: PORTABLE CHEST 1 VIEW COMPARISON:  09/23/2011 FINDINGS: The heart size and mediastinal contours are within normal limits. Both lungs are clear. The visualized skeletal structures are unremarkable. IMPRESSION: No active disease. Electronically Signed   By: Inez Catalina M.D.   On: 04/26/2016 10:32     Echo Pending  TELEMETRY: Sinus rhythm:  ASSESSMENT AND PLAN:  Active Problems:   Acute ST elevation myocardial infarction (STEMI) involving left anterior descending (LAD) coronary artery (HCC)   STEMI (ST elevation myocardial infarction) ([place])    1. Anterior STEMI, status post DES proximal LAD 2. Brief intermittent junctional rhythm versus atrial fibrillation with controlled ventricular rate, currently in sinus rhythm 3. Intermittent chest pain, relieved with sl ntg  Recommendations  1. Continue current medications 2. Add Imdur 30 mg  QD 3. Review 2D echocardiogram   Isaias Cowman, MD, PhD, [facility] 04/27/2016 8:30 AM

## 2016-04-27 NOTE — Plan of Care (Addendum)
Problem: Activity: Goal: Ability to tolerate increased activity will improve Outcome: Progressing Pt up to chair today, ambulated

## 2016-04-27 NOTE — Progress Notes (Signed)
   04/26/16 2332  Unmeasured Output  Emesis Occurrence 1  Emesis Characteristics  Emesis Appearance Undigested food

## 2016-04-27 NOTE — Telephone Encounter (Signed)
Pt is currently hospitalized.  Please review and authorize if appropriate.  Charyl Bigger, CMA

## 2016-04-27 NOTE — Progress Notes (Signed)
Dr. Saralyn Pilar notified that patient's troponins >65. No new orders. Patient complained of CP x2 this morning that was relieved by 2 doses of nitro. Nausea relieved by zofran. Dr. Saralyn Pilar aware of CP and nausea as well. Wanda Davenport

## 2016-04-27 NOTE — Plan of Care (Signed)
Pt had 8 beat run Vtach  2.3 sec pause while asleep  NP ordered Mg Level

## 2016-04-27 NOTE — Progress Notes (Signed)
   04/26/16 2345  Vitals  BP (!) 158/77  MAP (mmHg) 102  Pulse Rate 81  ECG Heart Rate 79  Resp (!) 21  Oxygen Therapy  SpO2 94 %    Pt complained of chest like she had experienced at admission, 1mg  morphine given, nitro and ekg performed. NP notified and pt got relief from nitro

## 2016-04-27 NOTE — Progress Notes (Signed)
   04/27/16 0546  Vitals  Ectopy Ventricular tachycardia, non-sustained;Other (Comment);Multifocal PVC's (8 bts/Tavaris Eudy RN aware)

## 2016-04-27 NOTE — Progress Notes (Signed)
Pt moved back to bed, repositioned.  Smiling and thankful for care.  Denies dizziness, SOB or CP.

## 2016-04-27 NOTE — Progress Notes (Signed)
Patient's HR dropping into 30's. Patient's rhythm appears to be NSR/SB, however irregular at times. Currently HR is high 50's to low 60's consistently, dropping intermittently. Patient did state that she feels slightly SOB, 2L [place] replaced. Dr. Saralyn Pilar notified. No new orders, stated that he would look at medications and possible make adjustments. Wilnette Kales

## 2016-04-27 NOTE — Progress Notes (Signed)
   04/27/16 0526  Vitals  Ectopy Ventricular tachycardia, non-sustained;Other (Comment) (6beats/Jordan Caraveo RN aware)

## 2016-04-27 NOTE — Consult Note (Signed)
   Name: Wanda Davenport MRN: [id number] DOB: May 30, 1923    ADMISSION DATE:  04/26/2016 CONSULTATION DATE: 04/26/2016   SIGNIFICANT EVENTS  No acute events overnight. Denies any chest pain or worsening dyspnea at the time of my evaluation.  STUDIES:  Cardiac Catheterization 04/26>>Anterior ST elevation myocardial infarction, 100% occlusion proximal LAD Successful primary PCI with DES proximal LAD.  Moderate reduced left ventricular function with LVEF 35%  HISTORY OF PRESENT ILLNESS:   This is a 83 yo female with a PMH of Prediabetes, Osteopenia, Left Bundle Branch Block, HTN, Hyperlipidemia, Hepatic steatosis, Diverticulosis, Chronic Systolic CHF, Bronchitis, Atypical Chest Pain, Arthritis, Anxiety, and Allergic Rhinitis  She presented to [facility] ER 04/26 with c/o 8/10 substernal chest pain with radiation to her neck, onset the morning of 04/26. Per ER notes EKG revealed baseline left bundle branch block, with new ST elevation in the anterior leads.  The pt was given sublingual nitroglycerin and started on a nitroglycerin gtt.  Cardiology consulted and pt taken to the cardiac catheterization lab, which revealed 100% occlusion in the proximal LAD, therefore a drug eluting stent was placed by Dr. Saralyn Pilar.  The pt was subsequently admitted to ICU for further management and treatment.    PAST MEDICAL HISTORY :   has a past medical history of Allergic rhinitis; Anxiety; Arthritis; Atypical chest pain; Bronchitis (01/14/2016); Chronic diastolic CHF (congestive heart failure) ([place]); DIVERTICULOSIS, COLON; HEMORRHOIDS, INTERNAL; Hepatic steatosis; Hyperlipidemia; HYPERTENSION; LBBB (left bundle branch block); Osteopenia; and Pre-diabetes.  has a past surgical history that includes Breast surgery; Tonsillectomy; Abdominal hysterectomy; Colonoscopy; Cataract extraction w/PHACO (Left, 05/12/2014); Left Heart Cath and Coronary Angiography (N/A, 04/26/2016); and Coronary Stent Intervention (N/A,  04/26/2016).   VITAL SIGNS: Temp:  [96.8 F (36 C)-97.9 F (36.6 C)] 97.4 F (36.3 C) (04/27 1200) Pulse Rate:  [55-84] 69 (04/27 1600) Resp:  [9-29] 15 (04/27 1600) BP: (91-158)/(46-82) 105/59 (04/27 1600) SpO2:  [90 %-99 %] 98 % (04/27 1600)  PHYSICAL EXAMINATION: General: well developed, well nourished Caucasian female  Neuro: alert and oriented, following commands HEENT: supple, no JVD Cardiovascular: irregular irregular, no M/R/G Lungs: clear throughout, even, non labored Abdomen: +BS x4, soft, non tender, non distended, obese Musculoskeletal: normal bulk and tone, no edema  Skin: intact, right groin mynx closed site dressing dry and intact    Recent Labs Lab 04/26/16 1034 04/27/16 0420  NA 137 138  K 4.4 4.4  CL 109 112*  CO2 21* 21*  BUN 30* 25*  CREATININE 0.89 0.80  GLUCOSE 144* 119*    Recent Labs Lab 04/26/16 1034 04/27/16 0420  HGB 13.2 12.5  HCT 38.7 37.1  WBC 14.0* 10.9  PLT 257 173   Dg Chest Port 1 View  Result Date: 04/26/2016 CLINICAL DATA:  Chest pain EXAM: PORTABLE CHEST 1 VIEW COMPARISON:  09/23/2011 FINDINGS: The heart size and mediastinal contours are within normal limits. Both lungs are clear. The visualized skeletal structures are unremarkable. IMPRESSION: No active disease. Electronically Signed   By: Inez Catalina M.D.   On: 04/26/2016 10:32    ASSESSMENT / PLAN: STEMI s/p PTCI Hypertension  HLD  P: Cardiology following. BP/heart rate control. Glycemic control. Will sign off. Will be available as needed.  Dimas Chyle MD PCCM

## 2016-04-27 NOTE — Plan of Care (Signed)
Pt had 6 beats of V-tach non-sustained. Pt stable at this time.

## 2016-04-27 NOTE — Plan of Care (Signed)
Problem: Safety: Goal: Ability to remain free from injury will improve Outcome: Progressing Pt injury free, safe environment provided

## 2016-04-27 NOTE — Progress Notes (Signed)
Pt up to chair.  Ambulatory with one person assist.  Pt is spontaneous, but appears steady on her feet.  Family remains at bedside.

## 2016-04-27 NOTE — Progress Notes (Signed)
[place] at [place] NAME: Wanda Davenport    MR#:  [id number]  DATE OF BIRTH:  [birth year]  SUBJECTIVE:  Patient came in with chest pain and found to have STEMI. She underwent emergent cardiac catheter and stent placement. Doing well. Daughter in the room. Earlier had some shortness of breath. Sats stable.  REVIEW OF SYSTEMS:   Review of Systems  Constitutional: Negative for chills, fever and weight loss.  HENT: Negative for ear discharge, ear pain and nosebleeds.   Eyes: Negative for blurred vision, pain and discharge.  Respiratory: Negative for sputum production, shortness of breath, wheezing and stridor.   Cardiovascular: Negative for chest pain, palpitations, orthopnea and PND.  Gastrointestinal: Negative for abdominal pain, diarrhea, nausea and vomiting.  Genitourinary: Negative for frequency and urgency.  Musculoskeletal: Negative for back pain and joint pain.  Neurological: Positive for weakness. Negative for sensory change, speech change and focal weakness.  Psychiatric/Behavioral: Negative for depression and hallucinations. The patient is not nervous/anxious.    Tolerating Diet: Tolerating PT:   DRUG ALLERGIES:   Allergies  Allergen Reactions  . Fenofibrate Other (See Comments)    Myalgias/gi upset   . Statins Other (See Comments)    Myalgias   . Sulfonamide Derivatives Nausea And Vomiting    REACTION: nausea    VITALS:  Blood pressure (!) 102/58, pulse 62, temperature 97.4 F (36.3 C), temperature source Oral, resp. rate 14, height 5\' 2"  (1.575 m), weight 77.2 kg (170 lb 3.1 oz), SpO2 93 %.  PHYSICAL EXAMINATION:   Physical Exam  GENERAL:  83 y.o.-year-old patient lying in the bed with no acute distress.  EYES: Pupils equal, round, reactive to light and accommodation. No scleral icterus. Extraocular muscles intact.  HEENT: Head atraumatic, normocephalic. Oropharynx and nasopharynx clear.  NECK:  Supple, no  jugular venous distention. No thyroid enlargement, no tenderness.  LUNGS: Normal breath sounds bilaterally, no wheezing, rales, rhonchi. No use of accessory muscles of respiration.  CARDIOVASCULAR: S1, S2 normal. No murmurs, rubs, or gallops.  ABDOMEN: Soft, nontender, nondistended. Bowel sounds present. No organomegaly or mass.  EXTREMITIES: No cyanosis, clubbing or edema b/l.    NEUROLOGIC: Cranial nerves II through XII are intact. No focal Motor or sensory deficits b/l.   PSYCHIATRIC:  patient is alert and oriented x 3.  SKIN: No obvious rash, lesion, or ulcer.   LABORATORY PANEL:  CBC  Recent Labs Lab 04/27/16 0420  WBC 10.9  HGB 12.5  HCT 37.1  PLT 173    Chemistries   Recent Labs Lab 04/27/16 0420  NA 138  K 4.4  CL 112*  CO2 21*  GLUCOSE 119*  BUN 25*  CREATININE 0.80  CALCIUM 9.6  MG 2.0   Cardiac Enzymes  Recent Labs Lab 04/27/16 0832  TROPONINI >65.00*   RADIOLOGY:  Dg Chest Port 1 View  Result Date: 04/26/2016 CLINICAL DATA:  Chest pain EXAM: PORTABLE CHEST 1 VIEW COMPARISON:  09/23/2011 FINDINGS: The heart size and mediastinal contours are within normal limits. Both lungs are clear. The visualized skeletal structures are unremarkable. IMPRESSION: No active disease. Electronically Signed   By: Inez Catalina M.D.   On: 04/26/2016 10:32   ASSESSMENT AND PLAN:  83 year old female presenting to the ED with a chief complaint of chest pain diagnosed with a STEMI and had cardiac catheterization 1 drug-eluting stent placement  # STEMI-status post cardiac catheterization 100% LAD blockage at 1 drug-eluting stent placement.  -Continue nitroglycerin drip, aspirin 81  mg, patient is started on Brilinta.   #Chronic systolic congestive heart failure not fluid overloaded at this time, monitor daily intake and output, monitor for symptoms and signs of fluid overload  #essential hypertension continue home medication lisinopril, bisoprolol  #Hyperlipidemia -cont  statin  #Allergic rhinitis Singulair and Flonase  #Borderline diabetes mellitus insulin sliding scale and diabetic diet   Case discussed with Care Management/Social Worker. Management plans discussed with the patient, family and they are in agreement.  CODE STATUS: full  DVT Prophylaxis: *lovenox  TOTAL TIME TAKING CARE OF THIS PATIENT: *30* minutes.  >50% time spent on counselling and coordination of care  POSSIBLE D/C IN 1-2 DAYS, DEPENDING ON CLINICAL CONDITION.  Note: This dictation was prepared with Dragon dictation along with smaller phrase technology. Any transcriptional errors that result from this process are unintentional.  Wanda Davenport M.D on 04/27/2016 at 2:06 PM  Between 7am to 6pm - Pager - [phone]  After 6pm go to [email or web address] - Proofreader  [facility]  Office  [phone]  CC: Primary care physician; Mellody Dance, DO

## 2016-04-28 LAB — HEMOGLOBIN A1C
HEMOGLOBIN A1C: 5.6 % (ref 4.8–5.6)
MEAN PLASMA GLUCOSE: 114 mg/dL

## 2016-04-28 LAB — TROPONIN T: Troponin T TROPT: 15.59 ng/mL (ref <0.011)

## 2016-04-28 NOTE — Progress Notes (Signed)
Patient transferred to room 242. RN and NT present in room during transfer. Wanda Davenport

## 2016-04-28 NOTE — Consult Note (Signed)
   Name: Wanda Davenport MRN: [id number] DOB: 30-Aug-1923    ADMISSION DATE:  04/26/2016 CONSULTATION DATE: 04/26/2016   SIGNIFICANT EVENTS  No acute events overnight. No chest pain overnight. Tolerating oral intake.   STUDIES:  Cardiac Catheterization 04/26>>Anterior ST elevation myocardial infarction, 100% occlusion proximal LAD Successful primary PCI with DES proximal LAD.  Moderate reduced left ventricular function with LVEF 35%  HISTORY OF PRESENT ILLNESS:   This is a 83 yo female with a PMH of Prediabetes, Osteopenia, Left Bundle Branch Block, HTN, Hyperlipidemia, Hepatic steatosis, Diverticulosis, Chronic Systolic CHF, Bronchitis, Atypical Chest Pain, Arthritis, Anxiety, and Allergic Rhinitis  She presented to [facility] ER 04/26 with c/o 8/10 substernal chest pain with radiation to her neck, onset the morning of 04/26. Per ER notes EKG revealed baseline left bundle branch block, with new ST elevation in the anterior leads.  The pt was given sublingual nitroglycerin and started on a nitroglycerin gtt.  Cardiology consulted and pt taken to the cardiac catheterization lab, which revealed 100% occlusion in the proximal LAD, therefore a drug eluting stent was placed by Dr. Saralyn Pilar.  The pt was subsequently admitted to ICU for further management and treatment.    PAST MEDICAL HISTORY :   has a past medical history of Allergic rhinitis; Anxiety; Arthritis; Atypical chest pain; Bronchitis (01/14/2016); Chronic diastolic CHF (congestive heart failure) ([place]); DIVERTICULOSIS, COLON; HEMORRHOIDS, INTERNAL; Hepatic steatosis; Hyperlipidemia; HYPERTENSION; LBBB (left bundle branch block); Osteopenia; and Pre-diabetes.  has a past surgical history that includes Breast surgery; Tonsillectomy; Abdominal hysterectomy; Colonoscopy; Cataract extraction w/PHACO (Left, 05/12/2014); Left Heart Cath and Coronary Angiography (N/A, 04/26/2016); and Coronary Stent Intervention (N/A, 04/26/2016).   VITAL SIGNS: Temp:   [98.1 F (36.7 C)-98.4 F (36.9 C)] 98.2 F (36.8 C) (04/28 1200) Pulse Rate:  [58-93] 81 (04/28 1218) Resp:  [15-27] 20 (04/28 1218) BP: (80-136)/(41-98) 95/46 (04/28 1218) SpO2:  [93 %-99 %] 95 % (04/28 1218)  PHYSICAL EXAMINATION: General: well developed, well nourished Caucasian female  Neuro: alert and oriented, following commands HEENT: supple, no JVD Cardiovascular: irregular irregular, no M/R/G Lungs: clear throughout, even, non labored Abdomen: +BS x4, soft, non tender, non distended, obese Musculoskeletal: normal bulk and tone, no edema  Skin: intact, right groin mynx closed site dressing dry and intact    Recent Labs Lab 04/26/16 1034 04/27/16 0420  NA 137 138  K 4.4 4.4  CL 109 112*  CO2 21* 21*  BUN 30* 25*  CREATININE 0.89 0.80  GLUCOSE 144* 119*    Recent Labs Lab 04/26/16 1034 04/27/16 0420  HGB 13.2 12.5  HCT 38.7 37.1  WBC 14.0* 10.9  PLT 257 173   No results found.  ASSESSMENT / PLAN: STEMI s/p PTCI Hypertension  HLD  P: Cardiology following. BP/heart rate control. Glycemic control. Transfer to telemetry. Will sign off. Will be available as needed.  Dimas Chyle MD PCCM

## 2016-04-28 NOTE — Progress Notes (Signed)
Report given to [name], Therapist, sports. Patient to be transferred to room 240 shortly. Patient updated on plan of care. Elink and CCMD notified of transfer. Wanda Davenport

## 2016-04-28 NOTE — Progress Notes (Signed)
[facility]  SUBJECTIVE: I don't have chest pain   Vitals:   04/28/16 0500 04/28/16 0600 04/28/16 0700 04/28/16 0800  BP: (!) 111/58 (!) 111/55 (!) 117/52 (!) 136/98  Pulse: 87 85 83 93  Resp: (!) 21     Temp:    98.1 F (36.7 C)  TempSrc:    Oral  SpO2: 96% 96% 97% 95%  Weight:      Height:         Intake/Output Summary (Last 24 hours) at 04/28/16 1009 Last data filed at 04/28/16 0500  Gross per 24 hour  Intake                0 ml  Output              150 ml  Net             -150 ml      PHYSICAL EXAM  General: Well developed, well nourished, in no acute distress HEENT:  Normocephalic and atramatic Neck:  No JVD.  Lungs: Clear bilaterally to auscultation and percussion. Heart: HRRR . Normal S1 and S2 without gallops or murmurs.  Abdomen: Bowel sounds are positive, abdomen soft and non-tender  Msk:  Back normal, normal gait. Normal strength and tone for age. Extremities: No clubbing, cyanosis or edema.   Neuro: Alert and oriented X 3. Psych:  Good affect, responds appropriately   LABS: Basic Metabolic Panel:  Recent Labs  04/26/16 1034 04/27/16 0420  NA 137 138  K 4.4 4.4  CL 109 112*  CO2 21* 21*  GLUCOSE 144* 119*  BUN 30* 25*  CREATININE 0.89 0.80  CALCIUM 9.7 9.6  MG 2.0 2.0  PHOS 2.5  --    Liver Function Tests: No results for input(s): AST, ALT, ALKPHOS, BILITOT, PROT, ALBUMIN in the last 72 hours. No results for input(s): LIPASE, AMYLASE in the last 72 hours. CBC:  Recent Labs  04/26/16 1034 04/27/16 0420  WBC 14.0* 10.9  HGB 13.2 12.5  HCT 38.7 37.1  MCV 86.4 88.1  PLT 257 173   Cardiac Enzymes:  Recent Labs  04/27/16 0832 04/27/16 1508 04/27/16 2055  TROPONINI >65.00* >65.00* >65.00*   BNP: Invalid input(s): POCBNP D-Dimer: No results for input(s): DDIMER in the last 72 hours. Hemoglobin A1C:  Recent Labs  04/27/16 0420  HGBA1C 5.6   Fasting Lipid Panel:  Recent Labs  04/27/16 0420  CHOL 218*  HDL 44   LDLCALC 146*  TRIG 141  CHOLHDL 5.0   Thyroid Function Tests: No results for input(s): TSH, T4TOTAL, T3FREE, THYROIDAB in the last 72 hours.  Invalid input(s): FREET3 Anemia Panel: No results for input(s): VITAMINB12, FOLATE, FERRITIN, TIBC, IRON, RETICCTPCT in the last 72 hours.  Dg Chest Port 1 View  Result Date: 04/26/2016 CLINICAL DATA:  Chest pain EXAM: PORTABLE CHEST 1 VIEW COMPARISON:  09/23/2011 FINDINGS: The heart size and mediastinal contours are within normal limits. Both lungs are clear. The visualized skeletal structures are unremarkable. IMPRESSION: No active disease. Electronically Signed   By: Inez Catalina M.D.   On: 04/26/2016 10:32     Echo EF 35-40% with anterior apical septal akinesis  TELEMETRY: Normal sinus rhythm:  ASSESSMENT AND PLAN:  Active Problems:   Acute ST elevation myocardial infarction (STEMI) involving left anterior descending (LAD) coronary artery (HCC)   STEMI (ST elevation myocardial infarction) ([place])    1. Status post anterior STEMI, DES proximal LAD 2. Ischemic cardiomyopathy  Recommendations  1. May transfer  to telemetry 2. Continue current medications   Isaias Cowman, MD, PhD, [facility] 04/28/2016 10:09 AM

## 2016-04-28 NOTE — Progress Notes (Signed)
[place] at [place] NAME: Wanda Davenport    MR#:  [id number]  DATE OF BIRTH:  [birth year]  SUBJECTIVE:  Patient came in with chest pain and found to have STEMI. She underwent emergent cardiac catheter and stent placement. Doing well. Daughter in the room. Earlier had some shortness of breath. Sats stable. Denies any complaints  REVIEW OF SYSTEMS:   Review of Systems  Constitutional: Negative for chills, fever and weight loss.  HENT: Negative for ear discharge, ear pain and nosebleeds.   Eyes: Negative for blurred vision, pain and discharge.  Respiratory: Negative for sputum production, shortness of breath, wheezing and stridor.   Cardiovascular: Negative for chest pain, palpitations, orthopnea and PND.  Gastrointestinal: Negative for abdominal pain, diarrhea, nausea and vomiting.  Genitourinary: Negative for frequency and urgency.  Musculoskeletal: Negative for back pain and joint pain.  Neurological: Positive for weakness. Negative for sensory change, speech change and focal weakness.  Psychiatric/Behavioral: Negative for depression and hallucinations. The patient is not nervous/anxious.    Tolerating Diet:Regular Tolerating PT: Pending  DRUG ALLERGIES:   Allergies  Allergen Reactions  . Fenofibrate Other (See Comments)    Myalgias/gi upset   . Statins Other (See Comments)    Myalgias   . Sulfonamide Derivatives Nausea And Vomiting    REACTION: nausea    VITALS:  Blood pressure (!) 113/95, pulse 84, temperature 98.1 F (36.7 C), temperature source Oral, resp. rate (!) 21, height 5\' 2"  (1.575 m), weight 77.2 kg (170 lb 3.1 oz), SpO2 95 %.  PHYSICAL EXAMINATION:   Physical Exam  GENERAL:  83 y.o.-year-old patient lying in the bed with no acute distress.  EYES: Pupils equal, round, reactive to light and accommodation. No scleral icterus. Extraocular muscles intact.  HEENT: Head atraumatic, normocephalic. Oropharynx and  nasopharynx clear.  NECK:  Supple, no jugular venous distention. No thyroid enlargement, no tenderness.  LUNGS: Normal breath sounds bilaterally, no wheezing, rales, rhonchi. No use of accessory muscles of respiration.  CARDIOVASCULAR: S1, S2 normal. No murmurs, rubs, or gallops.  ABDOMEN: Soft, nontender, nondistended. Bowel sounds present. No organomegaly or mass.  EXTREMITIES: No cyanosis, clubbing or edema b/l.    NEUROLOGIC: Cranial nerves II through XII are intact. No focal Motor or sensory deficits b/l.   PSYCHIATRIC:  patient is alert and oriented x 3.  SKIN: No obvious rash, lesion, or ulcer.   LABORATORY PANEL:  CBC  Recent Labs Lab 04/27/16 0420  WBC 10.9  HGB 12.5  HCT 37.1  PLT 173    Chemistries   Recent Labs Lab 04/27/16 0420  NA 138  K 4.4  CL 112*  CO2 21*  GLUCOSE 119*  BUN 25*  CREATININE 0.80  CALCIUM 9.6  MG 2.0   Cardiac Enzymes  Recent Labs Lab 04/27/16 2055  TROPONINI >65.00*   RADIOLOGY:  No results found. ASSESSMENT AND PLAN:  83 year old female presenting to the ED with a chief complaint of chest pain diagnosed with a STEMI and had cardiac catheterization 1 drug-eluting stent placement  # STEMI-status post cardiac catheterization 100% LAD blockage at drug-eluting stent placement.  -Continue nitroglycerin prn, aspirin 81 mg,Brilinta.  -bb and ace inhibitors  #Chronic systolic congestive heart failure not fluid overloaded at this time, monitor daily intake and output, monitor for symptoms and signs of fluid overload  #essential hypertension continue home medication lisinopril, bisoprolol  #Hyperlipidemia -cont statin  #Allergic rhinitis Singulair and Flonase  #Borderline diabetes mellitus insulin sliding scale and  diabetic diet  Transfer to telemetry. Physical therapy to see patient.  Case discussed with Care Management/Social Worker. Management plans discussed with the patient, family and they are in agreement.  CODE  STATUS: full  DVT Prophylaxis: *lovenox  TOTAL TIME TAKING CARE OF THIS PATIENT: *30* minutes.  >50% time spent on counselling and coordination of care  POSSIBLE D/C IN 1-2 DAYS, DEPENDING ON CLINICAL CONDITION.  Note: This dictation was prepared with Dragon dictation along with smaller phrase technology. Any transcriptional errors that result from this process are unintentional.  Kendryck Lacroix M.D on 04/28/2016 at 11:32 AM  Between 7am to 6pm - Pager - [phone]  After 6pm go to [email or web address] - Proofreader  [facility]  Office  [phone]  CC: Primary care physician; Mellody Dance, DO

## 2016-04-29 ENCOUNTER — Inpatient Hospital Stay: Payer: Medicare HMO

## 2016-04-29 MED ORDER — ISOSORBIDE MONONITRATE ER 30 MG PO TB24: 30.0000 mg | ORAL_TABLET | Freq: Every day | ORAL | 1 refills | Status: DC

## 2016-04-29 MED ORDER — TICAGRELOR 90 MG PO TABS: 90.0000 mg | ORAL_TABLET | Freq: Two times a day (BID) | ORAL | 1 refills | Status: DC

## 2016-04-29 MED ORDER — FUROSEMIDE 10 MG/ML IJ SOLN
20.0000 mg | Freq: Once | INTRAMUSCULAR | Status: AC
  Administered 2016-04-29: 20 mg via INTRAVENOUS
  Filled 2016-04-29: qty 2

## 2016-04-29 MED ORDER — ALPRAZOLAM 0.25 MG PO TABS
0.2500 mg | ORAL_TABLET | Freq: Once | ORAL | Status: AC
  Administered 2016-04-29: 0.25 mg via ORAL
  Filled 2016-04-29: qty 1

## 2016-04-29 MED ORDER — ALPRAZOLAM 0.25 MG PO TABS
0.2500 mg | ORAL_TABLET | Freq: Three times a day (TID) | ORAL | Status: DC | PRN
  Administered 2016-04-29 – 2016-04-30 (×2): 0.25 mg via ORAL
  Filled 2016-04-29 (×2): qty 1

## 2016-04-29 NOTE — Discharge Summary (Signed)
[facility] [place] NAME: Starla Deller    MR#:  [id number]  DATE OF BIRTH:  Jul 06, 1923  DATE OF ADMISSION:  04/26/2016 ADMITTING PHYSICIAN: Isaias Cowman, MD  DATE OF DISCHARGE: 04/29/2016  PRIMARY CARE PHYSICIAN: Mellody Dance, DO    ADMISSION DIAGNOSIS:  Unstable angina ([place]) [I20.0] Acute ST elevation myocardial infarction (STEMI) involving left anterior descending (LAD) coronary artery (HCC) [I21.02]  DISCHARGE DIAGNOSIS:  ACUTE STEMI s/p DES to proximal LAD Cardiomyopathy EF 35-40%  SECONDARY DIAGNOSIS:   Past Medical History:  Diagnosis Date  . Allergic rhinitis   . Anxiety   . Arthritis   . Atypical chest pain   . Bronchitis 01/14/2016  . Chronic diastolic CHF (congestive heart failure) (HCC)    Echo 06/2010 EF 60-65%, no RWMAs, grade 1 diastolic dysfunction, mild LAE, PASP 61mmHg.  [place] Kitchen DIVERTICULOSIS, COLON   . HEMORRHOIDS, INTERNAL   . Hepatic steatosis   . Hyperlipidemia   . HYPERTENSION   . LBBB (left bundle branch block)    a. present on ECG 06/2010  . Osteopenia   . Pre-diabetes    A1c 6.0    HOSPITAL COURSE:   83 year old female presenting to the ED with a chief complaint of chest pain diagnosed with a STEMI and had cardiac catheterization 1 drug-eluting stent placement  # Acute STEMI-status post cardiac catheterization 100% LAD blockage at drug-eluting stent placement.  -Continue nitroglycerin prn,aspirin 81 mg,Brilinta.  -bb and ace inhibitors  #Chronic systolic congestive heart failure not fluid overloaded at this time,monitor daily intake and output,monitor for symptoms and signs of fluid overload -mild sob -CXR no edema, mild effusion. Will give lasix IV 20 mg once today Wean off oxygen  #essential hypertension continue home medication lisinopril, bisoprolol  #Hyperlipidemia -cont statin  #Allergic rhinitis Singulair and Flonase  #Borderline diabetes mellitus insulin  sliding scale and diabetic diet  PT to see pt Hopefully can d/c her later today  Spoke with the dtr.  CONSULTS OBTAINED:  Treatment Team:  Isaias Cowman, MD  DRUG ALLERGIES:   Allergies  Allergen Reactions  . Fenofibrate Other (See Comments)    Myalgias/gi upset   . Statins Other (See Comments)    Myalgias   . Sulfonamide Derivatives Nausea And Vomiting    REACTION: nausea    DISCHARGE MEDICATIONS:   Current Discharge Medication List    START taking these medications   Details  isosorbide mononitrate (IMDUR) 30 MG 24 hr tablet Take 1 tablet (30 mg total) by mouth daily. Qty: 30 tablet, Refills: 1    ticagrelor (BRILINTA) 90 MG TABS tablet Take 1 tablet (90 mg total) by mouth 2 (two) times daily. Qty: 60 tablet, Refills: 1      CONTINUE these medications which have NOT CHANGED   Details  albuterol (PROVENTIL HFA;VENTOLIN HFA) 108 (90 BASE) MCG/ACT inhaler Inhale 2 puffs into the lungs every 6 (six) hours as needed. Wheezing or shortness of breath    aspirin 325 MG EC tablet Take 325 mg by mouth daily.    bisoprolol (ZEBETA) 10 MG tablet Take 1 tablet (10 mg total) by mouth daily. Qty: 90 tablet, Refills: 0    fluticasone (FLONASE) 50 MCG/ACT nasal spray Place 2 sprays into the nose daily as needed. Nasal congestion Rarely used    lisinopril (PRINIVIL,ZESTRIL) 20 MG tablet Take 1 tablet by mouth daily.    nitroGLYCERIN (NITROSTAT) 0.3 MG SL tablet Place 0.3 mg under the tongue every 5 (five) minutes as  needed for chest pain.    ALPRAZolam (XANAX) 0.25 MG tablet Take 0.25 mg by mouth daily as needed for anxiety.    Calcium Carbonate-Vitamin D 600-400 MG-UNIT tablet Take 2 tablets by mouth daily. Qty: 60 tablet, Refills: 11    escitalopram (LEXAPRO) 10 MG tablet TAKE 1 TABLET (10 MG TOTAL) BY MOUTH DAILY. Qty: 90 tablet, Refills: 1   Associated Diagnoses: Anxiety    montelukast (SINGULAIR) 10 MG tablet Take 1 tablet (10 mg total) by mouth at bedtime.  For wheeze/SOB Qty: 90 tablet, Refills: 0        If you experience worsening of your admission symptoms, develop shortness of breath, life threatening emergency, suicidal or homicidal thoughts you must seek medical attention immediately by calling 911 or calling your MD immediately  if symptoms less severe.  You Must read complete instructions/literature along with all the possible adverse reactions/side effects for all the Medicines you take and that have been prescribed to you. Take any new Medicines after you have completely understood and accept all the possible adverse reactions/side effects.   Please note  You were cared for by a hospitalist during your hospital stay. If you have any questions about your discharge medications or the care you received while you were in the hospital after you are discharged, you can call the unit and asked to speak with the hospitalist on call if the hospitalist that took care of you is not available. Once you are discharged, your primary care physician will handle any further medical issues. Please note that NO REFILLS for any discharge medications will be authorized once you are discharged, as it is imperative that you return to your primary care physician (or establish a relationship with a primary care physician if you do not have one) for your aftercare needs so that they can reassess your need for medications and monitor your lab values. Today   SUBJECTIVE    Sob, anxious VITAL SIGNS:  Blood pressure (!) 121/54, pulse 80, temperature 97.5 F (36.4 C), temperature source Oral, resp. rate 19, height 5\' 2"  (1.575 m), weight 77.2 kg (170 lb 3.1 oz), SpO2 97 %.  I/O:   Intake/Output Summary (Last 24 hours) at 04/29/16 1241 Last data filed at 04/28/16 1730  Gross per 24 hour  Intake              240 ml  Output              240 ml  Net                0 ml    PHYSICAL EXAMINATION:  GENERAL:  83 y.o.-year-old patient lying in the bed with no acute  distress.  EYES: Pupils equal, round, reactive to light and accommodation. No scleral icterus. Extraocular muscles intact.  HEENT: Head atraumatic, normocephalic. Oropharynx and nasopharynx clear.  NECK:  Supple, no jugular venous distention. No thyroid enlargement, no tenderness.  LUNGS: decreased breath sounds bilaterally, no wheezing, rales,rhonchi or crepitation. No use of accessory muscles of respiration.  CARDIOVASCULAR: S1, S2 normal. No murmurs, rubs, or gallops.  ABDOMEN: Soft, non-tender, non-distended. Bowel sounds present. No organomegaly or mass.  EXTREMITIES: No pedal edema, cyanosis, or clubbing.  NEUROLOGIC: Cranial nerves II through XII are intact. Muscle strength 5/5 in all extremities. Sensation intact. Gait not checked.  PSYCHIATRIC: The patient is alert and oriented x 3. anxious SKIN: No obvious rash, lesion, or ulcer.   DATA REVIEW:   CBC   Recent Labs Lab  04/27/16 0420  WBC 10.9  HGB 12.5  HCT 37.1  PLT 173    Chemistries   Recent Labs Lab 04/27/16 0420  NA 138  K 4.4  CL 112*  CO2 21*  GLUCOSE 119*  BUN 25*  CREATININE 0.80  CALCIUM 9.6  MG 2.0    Microbiology Results   Recent Results (from the past 240 hour(s))  MRSA PCR Screening     Status: Abnormal   Collection Time: 04/26/16  7:09 PM  Result Value Ref Range Status   MRSA by PCR POSITIVE (A) NEGATIVE Final    Comment:        The GeneXpert MRSA Assay (FDA approved for NASAL specimens only), is one component of a comprehensive MRSA colonization surveillance program. It is not intended to diagnose MRSA infection nor to guide or monitor treatment for MRSA infections. RESULT CALLED TO, READ BACK BY AND VERIFIED WITH: CAROLINE SOGOMO 04/26/16 @ 2025  [place]:  Dg Chest Port 1 View  Result Date: 04/29/2016 CLINICAL DATA:  Shortness of breath and chest pain EXAM: PORTABLE CHEST 1 VIEW COMPARISON:  April 26, 2016 FINDINGS: There are rather minimal pleural effusions  bilaterally. There is no edema or consolidation. Heart is mildly enlarged. There is no evident adenopathy. There is aortic atherosclerosis. No bone lesions. IMPRESSION: Mild cardiomegaly with rather minimal pleural effusions bilaterally. No edema or consolidation. There is aortic atherosclerosis. Electronically Signed   By: Lowella Grip III M.D.   On: 04/29/2016 11:35     Management plans discussed with the patient, family and they are in agreement.  CODE STATUS:     Code Status Orders        Start     Ordered   04/26/16 1713  Full code  Continuous     04/26/16 1712    Code Status History    Date Active Date Inactive Code Status Order ID Comments User Context   04/26/2016  2:08 PM 04/26/2016  5:12 PM Full Code [id number]  Isaias Cowman, MD Inpatient   09/23/2011  2:11 PM 09/26/2011  4:43 PM Full Code [id number]  Willette Pa, RN Inpatient    Advance Directive Documentation     Most Recent Value  Type of Advance Directive  Living will  Pre-existing out of facility DNR order (yellow form or pink MOST form)  -  "MOST" Form in Place?  -      TOTAL TIME TAKING CARE OF THIS PATIENT: *40* minutes.    Kule Gascoigne M.D on 04/29/2016 at 12:41 PM  Between 7am to 6pm - Pager - [phone] After 6pm go to [email or web address] - Proofreader  [facility]  Office  [phone]  CC: Primary care physician; Mellody Dance, DO

## 2016-04-29 NOTE — Plan of Care (Signed)
Problem: Pain Managment: Goal: General experience of comfort will improve Outcome: Not Progressing Patient complains of SOB and being so tired / fatigued it's become difficult to chew. Will continue to monitor. Wenda Low [facility]

## 2016-04-29 NOTE — Evaluation (Signed)
Physical Therapy Evaluation Patient Details Name: Wanda Davenport MRN: [id number] DOB: [birth year] Today's Date: 04/29/2016   History of Present Illness  Pt is a 83 y.o. female presenting to hospital with chest pain and admitted with anterior STEMI.  Pt s/p cardiac cath (finding 100% occlusion LAD) with stent placement 04/26/16.  PMH includes anxiety, chronic diastolic CHF, htn, LBBB, breast surgery.  Clinical Impression  Prior to hospital admission, pt was independent with functional mobility.  Pt lives alone on main level of home with 2 steps to enter (no railing).  Currently pt is SBA supine to/from sit, CGA with transfers, and CGA with ambulation 8-12 feet with RW.  Distance ambulated limited d/t pt with increased SOB and work of breathing with these activities requiring rest breaks (O2 90% on room air after ambulation but otherwise >94% on room air beginning and end of session resting in bed).  Pt would benefit from skilled PT to address noted impairments and functional limitations.  D/t pt's overall generalized weakness and impaired activity tolerance, pt does not appear safe to discharge home alone.  Recommend pt discharge to STR when medically appropriate.    Follow Up Recommendations SNF    Equipment Recommendations  Rolling walker with 5" wheels    Recommendations for Other Services       Precautions / Restrictions Precautions Precautions: Fall Restrictions Weight Bearing Restrictions: No      Mobility  Bed Mobility Overal bed mobility: Needs Assistance Bed Mobility: Supine to Sit;Sit to Supine     Supine to sit: Supervision Sit to supine: Supervision   General bed mobility comments: bed flat; increased effort and time to perform  Transfers Overall transfer level: Needs assistance Equipment used: Rolling walker (2 wheeled) Transfers: Sit to/from Stand Sit to Stand: Min guard         General transfer comment: increased effort to stand (x2 trials from bed; x1 trial  from chair)  Ambulation/Gait Ambulation/Gait assistance: Min guard Ambulation Distance (Feet):  (8 feet; 12 feet) Assistive device: Rolling walker (2 wheeled)   Gait velocity: decreased   General Gait Details: decreased B step length; vc's for safe walker use; limited d/t SOB/increased work of breathing  Stairs            Wheelchair Mobility    Modified Rankin (Stroke Patients Only)       Balance Overall balance assessment: Needs assistance Sitting-balance support: No upper extremity supported;Feet supported Sitting balance-Leahy Scale: Good     Standing balance support: Bilateral upper extremity supported (on RW) Standing balance-Leahy Scale: Fair Standing balance comment: static standing                             Pertinent Vitals/Pain Pain Assessment: No/denies pain  HR WFL during session.    Home Living Family/patient expects to be discharged to:: Private residence Living Arrangements: Alone Available Help at Discharge: Family Type of Home: House Home Access: Stairs to enter Entrance Stairs-Rails: None Entrance Stairs-Number of Steps: 2 steps to enter with L wall for support Home Layout: One level (does not have to go to basement) Home Equipment: Walker - 2 wheels;Cane - quad      Prior Function Level of Independence: Independent         Comments: Pt denies any falls in past 6 months.     Hand Dominance        Extremity/Trunk Assessment   Upper Extremity Assessment Upper Extremity Assessment: Generalized weakness  Lower Extremity Assessment Lower Extremity Assessment: Generalized weakness       Communication   Communication: HOH  Cognition Arousal/Alertness: Awake/alert Behavior During Therapy: WFL for tasks assessed/performed Overall Cognitive Status: Within Functional Limits for tasks assessed                                        General Comments General comments (skin integrity, edema, etc.):  Pt laying in bed with son, daughter, and daughter-in-law present in room.  Nursing cleared pt for participation in physical therapy.  Pt agreeable to PT session.    Exercises     Assessment/Plan    PT Assessment Patient needs continued PT services  PT Problem List Decreased strength;Decreased activity tolerance;Decreased balance;Decreased mobility;Decreased knowledge of use of DME;Cardiopulmonary status limiting activity       PT Treatment Interventions DME instruction;Gait training;Stair training;Functional mobility training;Therapeutic activities;Therapeutic exercise;Balance training;Patient/family education    PT Goals (Current goals can be found in the Care Plan section)  Acute Rehab PT Goals Patient Stated Goal: to go home PT Goal Formulation: With patient Time For Goal Achievement: 05/13/16 Potential to Achieve Goals: Fair    Frequency Min 2X/week   Barriers to discharge Decreased caregiver support      Co-evaluation               End of Session Equipment Utilized During Treatment: Gait belt Activity Tolerance: Patient limited by fatigue;Other (comment) (Limited d/t SOB) Patient left: in bed;with call bell/phone within reach;with bed alarm set;with family/visitor present Nurse Communication: Mobility status;Precautions (SOB with activity) PT Visit Diagnosis: Muscle weakness (generalized) (M62.81);Difficulty in walking, not elsewhere classified (R26.2)    Time: 5409-8119 PT Time Calculation (min) (ACUTE ONLY): 28 min   Charges:   PT Evaluation $PT Eval Low Complexity: 1 Procedure PT Treatments $Therapeutic Activity: 8-22 mins   PT G CodesLeitha Bleak, PT 04/29/16, 2:37 PM [phone]

## 2016-04-29 NOTE — NC FL2 (Signed)
[place] LEVEL OF CARE SCREENING TOOL     IDENTIFICATION  Patient Name: Wanda Davenport Birthdate: [birth year] Sex: female Admission Date (Current Location): 04/26/2016  [place] and [place] Number:  Engineering geologist and Address:  [facility], [address] [place], [place] [address]      Provider Number: [id number]  Attending Physician Name and Address:  Fritzi Mandes, MD  Relative Name and Phone Number:       Current Level of Care: Hospital Recommended Level of Care: [place] Prior Approval Number:    Date Approved/Denied:   PASRR Number: [id number]  Discharge Plan: SNF    Current Diagnoses: Patient Active Problem List   Diagnosis Date Noted  . Acute ST elevation myocardial infarction (STEMI) involving left anterior descending (LAD) coronary artery ([place]) 04/26/2016  . STEMI (ST elevation myocardial infarction) ([place]) 04/26/2016  . Knee pain, bilateral 04/10/2016  . Medication monitoring encounter 01/26/2016  . Intolerance of drug- statins 01/25/2016  . Counseling regarding end of life decision making 01/25/2016  . Hypertriglyceridemia 12/13/2015  . Low serum HDL 12/13/2015  . B12 deficiency 12/13/2015  . Claudication of left lower extremity ([place]) 12/13/2015  . Vitamin D deficiency 11/06/2015  . Adjustment disorder with mixed anxiety and depressed mood 11/06/2015  . Basal cell carcinoma 10/31/2015  . Environmental and seasonal allergies 10/31/2015  . Reactive airway disease- only spring and fall due to allergies 10/31/2015  . GAD (generalized anxiety disorder) 10/13/2015  . Atypical chest pain 04/16/2011  . Chronic diastolic heart failure ([place]) 04/16/2011  . Hyperlipidemia   . Pre-diabetes   . chronic LBBB (left bundle branch block)- since 2012   . Generalized OA   . Allergic rhinitis   . DIVERTICULOSIS, COLON 12/03/2007  . Hypertension with CHF ( diastolic dysf grade I)  97/67/3419    Orientation  RESPIRATION BLADDER Height & Weight     Self, Time, Situation, Place  Normal Continent Weight: 170 lb 3.1 oz (77.2 kg) Height:  5\' 2"  (157.5 cm)  BEHAVIORAL SYMPTOMS/MOOD NEUROLOGICAL BOWEL NUTRITION STATUS      Continent Diet (Heart Healthy)  AMBULATORY STATUS COMMUNICATION OF NEEDS Skin   Extensive Assist Verbally Normal                       Personal Care Assistance Level of Assistance  Bathing, Feeding, Dressing Bathing Assistance: Limited assistance Feeding assistance: Independent Dressing Assistance: Limited assistance     Functional Limitations Info             SPECIAL CARE FACTORS FREQUENCY  PT (By licensed PT)     PT Frequency: Up to 5X per day, 5 days per week              Contractures      Additional Factors Info  Allergies   Allergies Info: Fenofibrate, Statins, Sulfonamide Derivatives           Current Medications (04/29/2016):  This is the current hospital active medication list Current Facility-Administered Medications  Medication Dose Route Frequency Provider Last Rate Last Dose  . 0.9 %  sodium chloride infusion  250 mL Intravenous PRN Isaias Cowman, MD      . acetaminophen (TYLENOL) tablet 650 mg  650 mg Oral Q4H PRN Isaias Cowman, MD   650 mg at 04/29/16 0532  . ALPRAZolam (XANAX) tablet 0.25 mg  0.25 mg Oral Daily PRN Nicholes Mango, MD   0.25 mg at 04/29/16 1318  . aspirin  chewable tablet 81 mg  81 mg Oral Daily Isaias Cowman, MD   81 mg at 04/29/16 0917  . bisoprolol (ZEBETA) tablet 10 mg  10 mg Oral Daily Nicholes Mango, MD   10 mg at 04/29/16 0917  . Chlorhexidine Gluconate Cloth 2 % PADS 6 each  6 each Topical Q0600 Isaias Cowman, MD   6 each at 04/29/16 0600  . enoxaparin (LOVENOX) injection 40 mg  40 mg Subcutaneous Q24H Nicholes Mango, MD   40 mg at 04/28/16 2126  . fluticasone (FLONASE) 50 MCG/ACT nasal spray 2 spray  2 spray Each Nare Daily PRN Nicholes Mango, MD      . isosorbide mononitrate (IMDUR) 24 hr tablet  30 mg  30 mg Oral Daily Isaias Cowman, MD   30 mg at 04/29/16 0917  . lisinopril (PRINIVIL,ZESTRIL) tablet 20 mg  20 mg Oral Daily Nicholes Mango, MD   20 mg at 04/29/16 0917  . montelukast (SINGULAIR) tablet 10 mg  10 mg Oral QHS Nicholes Mango, MD   10 mg at 04/28/16 2126  . mupirocin ointment (BACTROBAN) 2 % 1 application  1 application Nasal BID Isaias Cowman, MD   1 application at 40/34/74 [phone]  . nitroGLYCERIN (NITROSTAT) SL tablet 0.4 mg  0.4 mg Sublingual Q5 Min x 3 PRN Nicholes Mango, MD   0.4 mg at 04/27/16 0757  . ondansetron (ZOFRAN) injection 4 mg  4 mg Intravenous Q6H PRN Isaias Cowman, MD      . ondansetron (ZOFRAN) injection 4 mg  4 mg Intravenous Q6H PRN Nicholes Mango, MD   4 mg at 04/27/16 0754  . pantoprazole (PROTONIX) EC tablet 40 mg  40 mg Oral Daily Nicholes Mango, MD   40 mg at 04/29/16 0917  . sodium chloride flush (NS) 0.9 % injection 3 mL  3 mL Intravenous Q12H Isaias Cowman, MD   3 mL at 04/29/16 0918  . sodium chloride flush (NS) 0.9 % injection 3 mL  3 mL Intravenous PRN Isaias Cowman, MD      . ticagrelor [facility]) tablet 90 mg  90 mg Oral BID Isaias Cowman, MD   90 mg at 04/29/16 6387     Discharge Medications: Please see discharge summary for a list of discharge medications.  Relevant Imaging Results:  Relevant Lab Results:   Additional Information SS# [id number]  Zettie Pho, LCSW

## 2016-04-29 NOTE — Progress Notes (Signed)
Patient and daughter requesting for anxiety medication. Dr. Ether Griffins notified with a new order for Xanax 0.25 mg every 8 hours as needed and D/C the the Xanax 0.25 mg q day. Patient and daughter very appreciative of it. Will continue to monitor.

## 2016-04-29 NOTE — Progress Notes (Signed)
[facility] Cardiology  SUBJECTIVE: I don't have any chest pain   Vitals:   04/28/16 1400 04/28/16 1626 04/28/16 1945 04/29/16 0455  BP: (!) 97/52 (!) 105/47 (!) 111/35 (!) 121/54  Pulse: 67 71 69 80  Resp: (!) 25 18  19   Temp:  98.5 F (36.9 C) 98.2 F (36.8 C) 97.5 F (36.4 C)  TempSrc:  Oral Oral Oral  SpO2: 97% 100% 100% 97%  Weight:      Height:         Intake/Output Summary (Last 24 hours) at 04/29/16 6789 Last data filed at 04/28/16 1730  Gross per 24 hour  Intake              240 ml  Output              240 ml  Net                0 ml      PHYSICAL EXAM  General: Well developed, well nourished, in no acute distress HEENT:  Normocephalic and atramatic Neck:  No JVD.  Lungs: Clear bilaterally to auscultation and percussion. Heart: HRRR . Normal S1 and S2 without gallops or murmurs.  Abdomen: Bowel sounds are positive, abdomen soft and non-tender  Msk:  Back normal, normal gait. Normal strength and tone for age. Extremities: No clubbing, cyanosis or edema.   Neuro: Alert and oriented X 3. Psych:  Good affect, responds appropriately   LABS: Basic Metabolic Panel:  Recent Labs  04/26/16 1034 04/27/16 0420  NA 137 138  K 4.4 4.4  CL 109 112*  CO2 21* 21*  GLUCOSE 144* 119*  BUN 30* 25*  CREATININE 0.89 0.80  CALCIUM 9.7 9.6  MG 2.0 2.0  PHOS 2.5  --    Liver Function Tests: No results for input(s): AST, ALT, ALKPHOS, BILITOT, PROT, ALBUMIN in the last 72 hours. No results for input(s): LIPASE, AMYLASE in the last 72 hours. CBC:  Recent Labs  04/26/16 1034 04/27/16 0420  WBC 14.0* 10.9  HGB 13.2 12.5  HCT 38.7 37.1  MCV 86.4 88.1  PLT 257 173   Cardiac Enzymes:  Recent Labs  04/27/16 0832 04/27/16 1508 04/27/16 2055  TROPONINI >65.00* >65.00* >65.00*   BNP: Invalid input(s): POCBNP D-Dimer: No results for input(s): DDIMER in the last 72 hours. Hemoglobin A1C:  Recent Labs  04/27/16 0420  HGBA1C 5.6   Fasting Lipid  Panel:  Recent Labs  04/27/16 0420  CHOL 218*  HDL 44  LDLCALC 146*  TRIG 141  CHOLHDL 5.0   Thyroid Function Tests: No results for input(s): TSH, T4TOTAL, T3FREE, THYROIDAB in the last 72 hours.  Invalid input(s): FREET3 Anemia Panel: No results for input(s): VITAMINB12, FOLATE, FERRITIN, TIBC, IRON, RETICCTPCT in the last 72 hours.  No results found.   Echo LV EF 35-40% with anterior apical septal akinesis  TELEMETRY: Sinus rhythm:  ASSESSMENT AND PLAN:  Active Problems:   Acute ST elevation myocardial infarction (STEMI) involving left anterior descending (LAD) coronary artery (HCC)   STEMI (ST elevation myocardial infarction) ([place])    1. Acute anterior STEMI, DES proximal LAD, no further chest pain 2. Ischemic cardiomyopathy, LVEF 35-40%, on bisoprolol, lisinopril, no evidence for fluid overload  Recommendations  1. Continue current medications 2. Continue dual antiplatelet therapy uninterrupted for 1 year 3. Continue bisoprolol and lisinopril 4. No further cardiac diagnostics at this time  Signed off for now, please call if any questions   Isaias Cowman, MD, PhD,  [facility] 04/29/2016 9:25 AM

## 2016-04-29 NOTE — Progress Notes (Signed)
The patient is requesting for Xanax for anxiety. The RN looked in the order and noted it was too early for her to get another dose since she got the last dose at 0758. The order is one time a day. Dr. Estanislado Pandy notified  and received a new order for Xanax 0.25 mg x 1 dose. Will continue to monitor.

## 2016-04-30 DIAGNOSIS — I5021 Acute systolic (congestive) heart failure: Secondary | ICD-10-CM | POA: Diagnosis not present

## 2016-04-30 DIAGNOSIS — I129 Hypertensive chronic kidney disease with stage 1 through stage 4 chronic kidney disease, or unspecified chronic kidney disease: Secondary | ICD-10-CM | POA: Diagnosis not present

## 2016-04-30 DIAGNOSIS — I2510 Atherosclerotic heart disease of native coronary artery without angina pectoris: Secondary | ICD-10-CM | POA: Diagnosis not present

## 2016-04-30 DIAGNOSIS — I350 Nonrheumatic aortic (valve) stenosis: Secondary | ICD-10-CM | POA: Diagnosis not present

## 2016-04-30 DIAGNOSIS — I213 ST elevation (STEMI) myocardial infarction of unspecified site: Secondary | ICD-10-CM | POA: Diagnosis not present

## 2016-04-30 DIAGNOSIS — R079 Chest pain, unspecified: Secondary | ICD-10-CM | POA: Diagnosis not present

## 2016-04-30 DIAGNOSIS — F419 Anxiety disorder, unspecified: Secondary | ICD-10-CM | POA: Diagnosis not present

## 2016-04-30 DIAGNOSIS — J309 Allergic rhinitis, unspecified: Secondary | ICD-10-CM | POA: Diagnosis not present

## 2016-04-30 DIAGNOSIS — Z882 Allergy status to sulfonamides status: Secondary | ICD-10-CM | POA: Diagnosis not present

## 2016-04-30 DIAGNOSIS — I5022 Chronic systolic (congestive) heart failure: Secondary | ICD-10-CM | POA: Diagnosis not present

## 2016-04-30 DIAGNOSIS — J449 Chronic obstructive pulmonary disease, unspecified: Secondary | ICD-10-CM | POA: Diagnosis not present

## 2016-04-30 DIAGNOSIS — Z8249 Family history of ischemic heart disease and other diseases of the circulatory system: Secondary | ICD-10-CM | POA: Diagnosis not present

## 2016-04-30 DIAGNOSIS — Z955 Presence of coronary angioplasty implant and graft: Secondary | ICD-10-CM | POA: Diagnosis not present

## 2016-04-30 DIAGNOSIS — I200 Unstable angina: Secondary | ICD-10-CM | POA: Diagnosis not present

## 2016-04-30 DIAGNOSIS — N183 Chronic kidney disease, stage 3 (moderate): Secondary | ICD-10-CM | POA: Diagnosis not present

## 2016-04-30 DIAGNOSIS — I25118 Atherosclerotic heart disease of native coronary artery with other forms of angina pectoris: Secondary | ICD-10-CM | POA: Diagnosis not present

## 2016-04-30 DIAGNOSIS — F329 Major depressive disorder, single episode, unspecified: Secondary | ICD-10-CM | POA: Diagnosis present

## 2016-04-30 DIAGNOSIS — I2102 ST elevation (STEMI) myocardial infarction involving left anterior descending coronary artery: Secondary | ICD-10-CM | POA: Diagnosis not present

## 2016-04-30 DIAGNOSIS — I25110 Atherosclerotic heart disease of native coronary artery with unstable angina pectoris: Secondary | ICD-10-CM | POA: Diagnosis not present

## 2016-04-30 DIAGNOSIS — E785 Hyperlipidemia, unspecified: Secondary | ICD-10-CM | POA: Diagnosis not present

## 2016-04-30 DIAGNOSIS — Z87891 Personal history of nicotine dependence: Secondary | ICD-10-CM | POA: Diagnosis not present

## 2016-04-30 DIAGNOSIS — I110 Hypertensive heart disease with heart failure: Secondary | ICD-10-CM | POA: Diagnosis not present

## 2016-04-30 DIAGNOSIS — Z7902 Long term (current) use of antithrombotics/antiplatelets: Secondary | ICD-10-CM | POA: Diagnosis not present

## 2016-04-30 DIAGNOSIS — Z888 Allergy status to other drugs, medicaments and biological substances status: Secondary | ICD-10-CM | POA: Diagnosis not present

## 2016-04-30 DIAGNOSIS — J9601 Acute respiratory failure with hypoxia: Secondary | ICD-10-CM | POA: Diagnosis not present

## 2016-04-30 DIAGNOSIS — Z9911 Dependence on respirator [ventilator] status: Secondary | ICD-10-CM | POA: Diagnosis not present

## 2016-04-30 DIAGNOSIS — I447 Left bundle-branch block, unspecified: Secondary | ICD-10-CM | POA: Diagnosis present

## 2016-04-30 DIAGNOSIS — R0602 Shortness of breath: Secondary | ICD-10-CM | POA: Diagnosis not present

## 2016-04-30 DIAGNOSIS — J189 Pneumonia, unspecified organism: Secondary | ICD-10-CM | POA: Diagnosis not present

## 2016-04-30 DIAGNOSIS — I10 Essential (primary) hypertension: Secondary | ICD-10-CM | POA: Diagnosis not present

## 2016-04-30 DIAGNOSIS — N179 Acute kidney failure, unspecified: Secondary | ICD-10-CM | POA: Diagnosis not present

## 2016-04-30 DIAGNOSIS — I5023 Acute on chronic systolic (congestive) heart failure: Secondary | ICD-10-CM | POA: Diagnosis not present

## 2016-04-30 DIAGNOSIS — Z959 Presence of cardiac and vascular implant and graft, unspecified: Secondary | ICD-10-CM | POA: Diagnosis not present

## 2016-04-30 DIAGNOSIS — Z7982 Long term (current) use of aspirin: Secondary | ICD-10-CM | POA: Diagnosis not present

## 2016-04-30 DIAGNOSIS — J810 Acute pulmonary edema: Secondary | ICD-10-CM | POA: Diagnosis not present

## 2016-04-30 DIAGNOSIS — I5043 Acute on chronic combined systolic (congestive) and diastolic (congestive) heart failure: Secondary | ICD-10-CM | POA: Diagnosis not present

## 2016-04-30 DIAGNOSIS — F325 Major depressive disorder, single episode, in full remission: Secondary | ICD-10-CM | POA: Diagnosis not present

## 2016-04-30 DIAGNOSIS — E781 Pure hyperglyceridemia: Secondary | ICD-10-CM | POA: Diagnosis not present

## 2016-04-30 MED ORDER — FUROSEMIDE 20 MG PO TABS: 20.0000 mg | ORAL_TABLET | ORAL | 0 refills | Status: DC

## 2016-04-30 MED ORDER — FUROSEMIDE 20 MG PO TABS: 20.0000 mg | ORAL_TABLET | ORAL | Status: DC

## 2016-04-30 MED ORDER — FUROSEMIDE 10 MG/ML IJ SOLN
40.0000 mg | Freq: Once | INTRAMUSCULAR | Status: AC
  Administered 2016-04-30: 40 mg via INTRAVENOUS
  Filled 2016-04-30: qty 4

## 2016-04-30 MED ORDER — ALPRAZOLAM 0.25 MG PO TABS: 0.2500 mg | ORAL_TABLET | Freq: Two times a day (BID) | ORAL | 0 refills | Status: DC | PRN

## 2016-04-30 NOTE — Clinical Social Work Note (Signed)
Patient to be d/c'ed today to [facility].  Patient and family agreeable to plans will transport via ems RN to call report to [phone].  Otila Starn, MSW, LCSWA [phone]  

## 2016-04-30 NOTE — Discharge Summary (Addendum)
Wanda [facility]port at [place] NAME: Laurena Valko    MR#:  [id number]  DATE OF BIRTH:  12-Dec-1923  DATE OF ADMISSION:  04/26/2016 ADMITTING PHYSICIAN: Isaias Cowman, MD  DATE OF DISCHARGE: 04/30/2016  PRIMARY CARE PHYSICIAN: Mellody Dance, DO    ADMISSION DIAGNOSIS:  Unstable angina ([place]) [I20.0] Acute ST elevation myocardial infarction (STEMI) involving left anterior descending (LAD) coronary artery (HCC) [I21.02]  DISCHARGE DIAGNOSIS:  ACUTE STEMI s/p DES to proximal LAD Cardiomyopathy EF 35-40% Acute mild CHF systolic-new  SECONDARY DIAGNOSIS:   Past Medical History:  Diagnosis Date  . Allergic rhinitis   . Anxiety   . Arthritis   . Atypical chest pain   . Bronchitis 01/14/2016  . Chronic diastolic CHF (congestive heart failure) (HCC)    Echo 06/2010 EF 60-65%, no RWMAs, grade 1 diastolic dysfunction, mild LAE, PASP 6mmHg.  Marland Kitchen DIVERTICULOSIS, COLON   . HEMORRHOIDS, INTERNAL   . Hepatic steatosis   . Hyperlipidemia   . HYPERTENSION   . LBBB (left bundle branch block)    a. present on ECG 06/2010  . Osteopenia   . Pre-diabetes    A1c 6.0    HOSPITAL COURSE:   83 year old female presenting to the ED with a chief complaint of chest pain diagnosed with a STEMI and had cardiac catheterization 1 drug-eluting stent placement  # Acute STEMI-status post cardiac catheterization 100% LAD blockage at drug-eluting stent placement.  -Continue nitroglycerin prn,aspirin 81 mg,Brilinta.  -bb and ace inhibitors  #acute  systolic congestive heart failure -mild sob -CXR no edema, mild effusion.Feels a lot better with breathing after receiving Lasix yesterday and today  Wean off oxygen as sats improved to keep sats greater than 92% -Patient was not on oxygen prior to admission -Echo EF 35-40%  #essential hypertension  -continue home medication lisinopril, bisoprolol  #Hyperlipidemia -cont statin  #Allergic rhinitis  Singulair and Flonase  #Borderline diabetes mellitus insulin sliding scale and diabetic diet  PT --- recommends rehabilitation  Spoke with the dtr. Agreeable for rehabilitation. Social worker consulted Discharged to rehabilitation once bed available.  CONSULTS OBTAINED:  Treatment Team:  Isaias Cowman, MD  DRUG ALLERGIES:   Allergies  Allergen Reactions  . Fenofibrate Other (See Comments)    Myalgias/gi upset   . Statins Other (See Comments)    Myalgias   . Sulfonamide Derivatives Nausea And Vomiting    REACTION: nausea    DISCHARGE MEDICATIONS:   Current Discharge Medication List    START taking these medications   Details  furosemide (LASIX) 20 MG tablet Take 1 tablet (20 mg total) by mouth every other day. Qty: 30 tablet, Refills: 0    isosorbide mononitrate (IMDUR) 30 MG 24 hr tablet Take 1 tablet (30 mg total) by mouth daily. Qty: 30 tablet, Refills: 1    ticagrelor (BRILINTA) 90 MG TABS tablet Take 1 tablet (90 mg total) by mouth 2 (two) times daily. Qty: 60 tablet, Refills: 1      CONTINUE these medications which have CHANGED   Details  ALPRAZolam (XANAX) 0.25 MG tablet Take 1 tablet (0.25 mg total) by mouth 2 (two) times daily as needed for anxiety. Qty: 30 tablet, Refills: 0      CONTINUE these medications which have NOT CHANGED   Details  albuterol (PROVENTIL HFA;VENTOLIN HFA) 108 (90 BASE) MCG/ACT inhaler Inhale 2 puffs into the lungs every 6 (six) hours as needed. Wheezing or shortness of breath    aspirin 325 MG EC tablet  Take 325 mg by mouth daily.    bisoprolol (ZEBETA) 10 MG tablet Take 1 tablet (10 mg total) by mouth daily. Qty: 90 tablet, Refills: 0    fluticasone (FLONASE) 50 MCG/ACT nasal spray Place 2 sprays into the nose daily as needed. Nasal congestion Rarely used    lisinopril (PRINIVIL,ZESTRIL) 20 MG tablet Take 1 tablet by mouth daily.    nitroGLYCERIN (NITROSTAT) 0.3 MG SL tablet Place 0.3 mg under the tongue every 5  (five) minutes as needed for chest pain.    Calcium Carbonate-Vitamin D 600-400 MG-UNIT tablet Take 2 tablets by mouth daily. Qty: 60 tablet, Refills: 11    escitalopram (LEXAPRO) 10 MG tablet TAKE 1 TABLET (10 MG TOTAL) BY MOUTH DAILY. Qty: 90 tablet, Refills: 1   Associated Diagnoses: Anxiety    montelukast (SINGULAIR) 10 MG tablet Take 1 tablet (10 mg total) by mouth at bedtime. For wheeze/SOB Qty: 90 tablet, Refills: 0        If you experience worsening of your admission symptoms, develop shortness of breath, life threatening emergency, suicidal or homicidal thoughts you must seek medical attention immediately by calling 911 or calling your MD immediately  if symptoms less severe.  You Must read complete instructions/literature along with all the possible adverse reactions/side effects for all the Medicines you take and that have been prescribed to you. Take any new Medicines after you have completely understood and accept all the possible adverse reactions/side effects.   Please note  You were cared for by a hospitalist during your hospital stay. If you have any questions about your discharge medications or the care you received while you were in the hospital after you are discharged, you can call the unit and asked to speak with the hospitalist on call if the hospitalist that took care of you is not available. Once you are discharged, your primary care physician will handle any further medical issues. Please note that NO REFILLS for any discharge medications will be authorized once you are discharged, as it is imperative that you return to your primary care physician (or establish a relationship with a primary care physician if you do not have one) for your aftercare needs so that they can reassess your need for medications and monitor your lab values. Today   SUBJECTIVE    Sob better. Slept good. dter in the room VITAL SIGNS:  Blood pressure (!) 121/51, pulse 77, temperature 97.9  F (36.6 C), temperature source Oral, resp. rate 18, height 5\' 2"  (1.575 m), weight 77.2 kg (170 lb 3.1 oz), SpO2 98 %.  I/O:    Intake/Output Summary (Last 24 hours) at 04/30/16 0921 Last data filed at 04/30/16 0845  Gross per 24 hour  Intake                0 ml  Output              950 ml  Net             -950 ml    PHYSICAL EXAMINATION:  GENERAL:  83 y.o.-year-old patient lying in the bed with no acute distress.  EYES: Pupils equal, round, reactive to light and accommodation. No scleral icterus. Extraocular muscles intact.  HEENT: Head atraumatic, normocephalic. Oropharynx and nasopharynx clear.  NECK:  Supple, no jugular venous distention. No thyroid enlargement, no tenderness.  LUNGS: decreased breath sounds bilaterally, no wheezing, rales,rhonchi or crepitation. No use of accessory muscles of respiration.  CARDIOVASCULAR: S1, S2 normal. No murmurs, rubs,  or gallops.  ABDOMEN: Soft, non-tender, non-distended. Bowel sounds present. No organomegaly or mass.  EXTREMITIES: No pedal edema, cyanosis, or clubbing.  NEUROLOGIC: Cranial nerves II through XII are intact. Muscle strength 5/5 in all extremities. Sensation intact. Gait not checked.  PSYCHIATRIC: The patient is alert and oriented x 3. anxious SKIN: No obvious rash, lesion, or ulcer.   DATA REVIEW:   CBC   Recent Labs Lab 04/27/16 0420  WBC 10.9  HGB 12.5  HCT 37.1  PLT 173    Chemistries   Recent Labs Lab 04/27/16 0420  NA 138  K 4.4  CL 112*  CO2 21*  GLUCOSE 119*  BUN 25*  CREATININE 0.80  CALCIUM 9.6  MG 2.0    Microbiology Results   Recent Results (from the past 240 hour(s))  MRSA PCR Screening     Status: Abnormal   Collection Time: 04/26/16  7:09 PM  Result Value Ref Range Status   MRSA by PCR POSITIVE (A) NEGATIVE Final    Comment:        The GeneXpert MRSA Assay (FDA approved for NASAL specimens only), is one component of a comprehensive MRSA colonization surveillance program. It is  not intended to diagnose MRSA infection nor to guide or monitor treatment for MRSA infections. RESULT CALLED TO, READ BACK BY AND VERIFIED WITH: CAROLINE SOGOMO 04/26/16 @ 2025  [place]:  Dg Chest Port 1 View  Result Date: 04/29/2016 CLINICAL DATA:  Shortness of breath and chest pain EXAM: PORTABLE CHEST 1 VIEW COMPARISON:  April 26, 2016 FINDINGS: There are rather minimal pleural effusions bilaterally. There is no edema or consolidation. Heart is mildly enlarged. There is no evident adenopathy. There is aortic atherosclerosis. No bone lesions. IMPRESSION: Mild cardiomegaly with rather minimal pleural effusions bilaterally. No edema or consolidation. There is aortic atherosclerosis. Electronically Signed   By: Lowella Grip III M.D.   On: 04/29/2016 11:35     Management plans discussed with the patient, family and they are in agreement.  CODE STATUS:     Code Status Orders        Start     Ordered   04/26/16 1713  Full code  Continuous     04/26/16 1712    Code Status History    Date Active Date Inactive Code Status Order ID Comments User Context   04/26/2016  2:08 PM 04/26/2016  5:12 PM Full Code [id number]  Isaias Cowman, MD Inpatient   09/23/2011  2:11 PM 09/26/2011  4:43 PM Full Code [id number]  Willette Pa, RN Inpatient    Advance Directive Documentation     Most Recent Value  Type of Advance Directive  Living will  Pre-existing out of facility DNR order (yellow form or pink MOST form)  -  "MOST" Form in Place?  -      TOTAL TIME TAKING CARE OF THIS PATIENT: *40* minutes.    Posey Jasmin M.D on 04/30/2016 at 9:21 AM  Between 7am to 6pm - Pager - [phone] After 6pm go to [email or web address] - Proofreader  [facility]  Office  [phone]  CC: Primary care physician; Mellody Dance, DO

## 2016-04-30 NOTE — Evaluation (Signed)
Occupational Therapy Evaluation Patient Details Name: Wanda Davenport MRN: [id number] DOB: Dec 11, 1923 Today's Date: 04/30/2016    History of Present Illness Pt is a 83 y.o. female presenting to hospital with chest pain and admitted with anterior STEMI.  Pt s/p cardiac cath (finding 100% occlusion LAD) with stent placement 04/26/16.  PMH includes anxiety, chronic diastolic CHF, htn, LBBB, breast surgery.   Clinical Impression   Pt is 83 year old female who presents to [facility] hospital with chest pain and anterior STEMI.  She was living at home alone prior to admission.  Her son and his wife and her daughter were present for the evaluation.  Pt is SOB sitting up in bed and evaluation modified due to low BP earlier today.  Pt instructed in energy conservation tech, pursed lip breathing and recommendations discussed to increase safety at home after she goes to [facility] for rehab.  Rec a shower chair, a raised toilet seat since there is not enough room for a BSC over her toilet, a walker bag, reacher and possibly a LH shoe horn.  She currently requires min assist for ADLs due to SOB, decreased endurance for functional tasks and at risk for falls.  Pt would benefit from skilled OT services to increase independence in ADLs, education in energy conservation techniques, pursed lip breathing and recommendations for home modifications to increase safety and prevent falls.  Rec SNF after discharge from hospital.    Follow Up Recommendations  SNF    Equipment Recommendations  Tub/shower seat;Toilet rise with handles;Other (comment) (rec toilet riser since there is no room for [facility] over toilet)    Recommendations for Other Services       Precautions / Restrictions Precautions Precautions: Fall Restrictions Weight Bearing Restrictions: No      Mobility Bed Mobility                  Transfers                      Balance                                            ADL either performed or assessed with clinical judgement   ADL Overall ADL's : Needs assistance/impaired Eating/Feeding: Independent;Set up   Grooming: Wash/dry hands;Wash/dry face;Oral care;Brushing hair;Independent;Set up           Upper Body Dressing : Set up;Min guard Upper Body Dressing Details (indicate cue type and reason): min guard for SOB and decreased balance sitting up EOB Lower Body Dressing: Set up;Minimal assistance;Sit to/from stand;Cueing for safety Lower Body Dressing Details (indicate cue type and reason): SOB and needs further education and practice with energy conservation and purse lip breathing to address SOB and reminders to use FWW when standing                     Vision         Perception     Praxis      Pertinent Vitals/Pain Pain Assessment: No/denies pain     Hand Dominance Right   Extremity/Trunk Assessment Upper Extremity Assessment Upper Extremity Assessment: Generalized weakness   Lower Extremity Assessment Lower Extremity Assessment: Defer to PT evaluation       Communication Communication Communication: HOH   Cognition Arousal/Alertness: Awake/alert Behavior During Therapy: [facility] for tasks assessed/performed Overall Cognitive  Status: Within Functional Limits for tasks assessed                                     General Comments       Exercises     Shoulder Instructions      Home Living Family/patient expects to be discharged to:: Private residence Living Arrangements: Alone Available Help at Discharge: Family Type of Home: House Home Access: Stairs to enter [employer] of Steps: 2 steps to enter with L wall for support Entrance Stairs-Rails: None Home Layout: One level     Bathroom Shower/Tub: Teacher, early years/pre: Standard     Home Equipment: Environmental consultant - 2 wheels;Cane - quad;Hand held shower head          Prior Functioning/Environment Level of Independence:  Independent        Comments: Pt denies any falls in past 6 months.        OT Problem List: Decreased strength;Impaired balance (sitting and/or standing);Decreased activity tolerance;Cardiopulmonary status limiting activity      OT Treatment/Interventions: Self-care/ADL training;Therapeutic exercise;Energy conservation;Therapeutic activities;Patient/family education    OT Goals(Current goals can be found in the care plan section) Acute Rehab OT Goals Patient Stated Goal: to go home OT Goal Formulation: With patient/family Time For Goal Achievement: 05/14/16 Potential to Achieve Goals: Good ADL Goals Pt Will Perform Lower Body Dressing: with supervision;sit to/from stand (using FWW and no LOB) Pt Will Transfer to Toilet: with supervision;stand pivot transfer;regular height toilet (with FWW and no LOB)  OT Frequency: Min 1X/week   Barriers to D/C:    was living at home alone       Co-evaluation              AM-PAC PT "6 Clicks" Daily Activity     Outcome Measure Help from another person eating meals?: None Help from another person taking care of personal grooming?: None Help from another person toileting, which includes using toliet, bedpan, or urinal?: A Little Help from another person bathing (including washing, rinsing, drying)?: A Little Help from another person to put on and taking off regular upper body clothing?: None Help from another person to put on and taking off regular lower body clothing?: A Little 6 Click Score: 21   End of Session    Activity Tolerance: Patient tolerated treatment well Patient left: in bed;with call bell/phone within reach;with bed alarm set;with family/visitor present  OT Visit Diagnosis: Unsteadiness on feet (R26.81);Muscle weakness (generalized) (M62.81)                Time: 1600-1630 OT Time Calculation (min): 30 min Charges:  OT General Charges $OT Visit: 1 Procedure OT Evaluation $OT Eval Low Complexity: 1 Procedure OT  Treatments $Self Care/Home Management : 8-22 mins G-Codes:     Chrys Racer, OTR/L ascom [phone] 04/30/16, 4:42 PM

## 2016-04-30 NOTE — Progress Notes (Signed)
Report called to [place] at [facility].  EMS notified for transport.

## 2016-04-30 NOTE — Progress Notes (Signed)
Patient transported via EMS to [facility] in stable condition.

## 2016-04-30 NOTE — Care Management Important Message (Signed)
Important Message  Patient Details  Name: Wanda Davenport MRN: [id number] Date of Birth: 04/24/1923   Medicare Important Message Given:  Yes    Katrina Stack, RN 04/30/2016, 9:48 AM

## 2016-04-30 NOTE — Progress Notes (Signed)
Notified Dr. Posey Pronto of patient's hypotension.  Continue to monitor.

## 2016-04-30 NOTE — Clinical Social Work Note (Signed)
Clinical Social Work Assessment  Patient Details  Name: Wanda Davenport MRN: [id number] Date of Birth: July 23, 1923  Date of referral:  04/30/16               Reason for consult:  Facility Placement                Permission sought to share information with:  Family Supports Permission granted to share information::  Yes, Verbal Permission Granted  Name::     Haf[place], Lohn [phone]  [phone]   Agency::  SNF admissions  Relationship::     Contact Information:     Housing/Transportation Living arrangements for the past 2 months:  Single Family Home Source of Information:  Adult Children Patient Interpreter Needed:  None Criminal Activity/Legal Involvement Pertinent to Current Situation/Hospitalization:  No - Comment as needed Significant Relationships:  Adult Children Lives with:  Self Do you feel safe going back to the place where you live?  No Need for family participation in patient care:  No (Coment)  Care giving concerns:  Patient feels she needs short term rehab before she is able to return back home.   Social Worker assessment / plan:  Patient is a 83 year old female who is alert and oriented x4 and able to express her feelings.  Patient was sleeping, CSW completed assessment by speaking with patient's son.  CSW explained process of looking for SNF and what to expect at SNF.  CSW explained how insurance will pay for stay and what to expect for discharge planning from SNF.  Patient's son did not express any other questions or concerns.  CSW was given permission to begin bed search in [place].  Employment status:  Retired Nurse, adult PT Recommendations:  [place] / Referral to community resources:  [place]  Patient/Family's Response to care:  Patient's family in agreement to going to SNF for short term rehab.  Patient/Family's Understanding of and Emotional Response to Diagnosis,  Current Treatment, and Prognosis:  Patient's family hopeful she will not have to be in rehab very long.  Emotional Assessment Appearance:  Appears stated age Attitude/Demeanor/Rapport:    Affect (typically observed):  Calm, Appropriate Orientation:  Oriented to Self, Oriented to Place, Oriented to  Time, Oriented to Situation Alcohol / Substance use:  Not Applicable Psych involvement (Current and /or in the community):  No (Comment)  Discharge Needs  Concerns to be addressed:  Lack of Support Readmission within the last 30 days:  No Current discharge risk:  Lives alone Barriers to Discharge:  No Barriers Identified   Ross Ludwig, LCSWA 04/30/2016, 5:10 PM

## 2016-04-30 NOTE — Clinical Social Work Placement (Signed)
   CLINICAL SOCIAL WORK PLACEMENT  NOTE  Date:  04/30/2016  Patient Details  Name: Wanda Davenport MRN: [id number] Date of Birth: [birth year]  Clinical Social Work is seeking post-discharge placement for this patient at the [place] level of care (*CSW will initial, date and re-position this form in  chart as items are completed):  Yes   Patient/family provided with [place] Work Department's list of facilities offering this level of care within the geographic area requested by the patient (or if unable, by the patient's family).  Yes   Patient/family informed of their freedom to choose among providers that offer the needed level of care, that participate in Medicare, Medicaid or managed care program needed by the patient, have an available bed and are willing to accept the patient.  Yes   Patient/family informed of [place]'s ownership interest in [facility] and [facility], as well as of the fact that they are under no obligation to receive care at these facilities.  PASRR submitted to EDS on 04/30/16     PASRR number received on       Existing PASRR number confirmed on 04/30/16     FL2 transmitted to all facilities in geographic area requested by pt/family on 04/30/16     FL2 transmitted to all facilities within larger geographic area on       Patient informed that his/her managed care company has contracts with or will negotiate with certain facilities, including the following:        Yes   Patient/family informed of bed offers received.  Patient chooses bed at [facility]     Physician recommends and patient chooses bed at      Patient to be transferred to [facility] on 04/30/16.  Patient to be transferred to facility by [facility]     Patient family notified on 04/30/16 of transfer.  Name of family member notified:  [name] patient's son.     PHYSICIAN Please sign FL2      Additional Comment:    _______________________________________________ Ross Ludwig, LCSWA 04/30/2016, 5:15 PM

## 2016-05-01 DIAGNOSIS — I25118 Atherosclerotic heart disease of native coronary artery with other forms of angina pectoris: Secondary | ICD-10-CM | POA: Diagnosis not present

## 2016-05-01 DIAGNOSIS — I5022 Chronic systolic (congestive) heart failure: Secondary | ICD-10-CM | POA: Diagnosis not present

## 2016-05-03 ENCOUNTER — Other Ambulatory Visit: Payer: Self-pay | Admitting: *Deleted

## 2016-05-03 NOTE — Patient Outreach (Signed)
[facility]) Care Management  05/03/2016  Wanda Davenport Apr 26, 1923 [id number]   Met with Drue Novel, SW at facility, she reports patient discharge plan is to go home.   Attempted to meet with patient but patient sleeping soundly, did not disturb.  Will attempt to visit at next facility visit. Royetta Crochet. Laymond Purser, RN, BSN, [place] [phone]) Business Cell  [phone]) Toll Free Office

## 2016-05-05 ENCOUNTER — Encounter
Admission: EM | Disposition: A | Discharge status: Skilled Nursing Facility | Payer: Self-pay | Source: Home / Self Care | Attending: Internal Medicine

## 2016-05-05 ENCOUNTER — Inpatient Hospital Stay
Admission: EM | Admit: 2016-05-05 | Discharge: 2016-05-06 | DRG: 280 | Disposition: A | Discharge status: Skilled Nursing Facility | Payer: Medicare HMO | Attending: Internal Medicine | Admitting: Internal Medicine

## 2016-05-05 ENCOUNTER — Emergency Department: Payer: Medicare HMO

## 2016-05-05 DIAGNOSIS — Z7982 Long term (current) use of aspirin: Secondary | ICD-10-CM

## 2016-05-05 DIAGNOSIS — I447 Left bundle-branch block, unspecified: Secondary | ICD-10-CM | POA: Diagnosis present

## 2016-05-05 DIAGNOSIS — I2102 ST elevation (STEMI) myocardial infarction involving left anterior descending coronary artery: Secondary | ICD-10-CM

## 2016-05-05 DIAGNOSIS — I213 ST elevation (STEMI) myocardial infarction of unspecified site: Secondary | ICD-10-CM | POA: Diagnosis present

## 2016-05-05 DIAGNOSIS — Z87891 Personal history of nicotine dependence: Secondary | ICD-10-CM | POA: Diagnosis not present

## 2016-05-05 DIAGNOSIS — Z955 Presence of coronary angioplasty implant and graft: Secondary | ICD-10-CM

## 2016-05-05 DIAGNOSIS — J9601 Acute respiratory failure with hypoxia: Secondary | ICD-10-CM | POA: Diagnosis not present

## 2016-05-05 DIAGNOSIS — R079 Chest pain, unspecified: Secondary | ICD-10-CM | POA: Diagnosis not present

## 2016-05-05 DIAGNOSIS — J9690 Respiratory failure, unspecified, unspecified whether with hypoxia or hypercapnia: Secondary | ICD-10-CM

## 2016-05-05 DIAGNOSIS — Z888 Allergy status to other drugs, medicaments and biological substances status: Secondary | ICD-10-CM

## 2016-05-05 DIAGNOSIS — N179 Acute kidney failure, unspecified: Secondary | ICD-10-CM | POA: Diagnosis present

## 2016-05-05 DIAGNOSIS — J810 Acute pulmonary edema: Secondary | ICD-10-CM

## 2016-05-05 DIAGNOSIS — Z7902 Long term (current) use of antithrombotics/antiplatelets: Secondary | ICD-10-CM | POA: Diagnosis not present

## 2016-05-05 DIAGNOSIS — F329 Major depressive disorder, single episode, unspecified: Secondary | ICD-10-CM | POA: Diagnosis present

## 2016-05-05 DIAGNOSIS — Z882 Allergy status to sulfonamides status: Secondary | ICD-10-CM | POA: Diagnosis not present

## 2016-05-05 DIAGNOSIS — J309 Allergic rhinitis, unspecified: Secondary | ICD-10-CM | POA: Diagnosis present

## 2016-05-05 DIAGNOSIS — I129 Hypertensive chronic kidney disease with stage 1 through stage 4 chronic kidney disease, or unspecified chronic kidney disease: Secondary | ICD-10-CM | POA: Diagnosis not present

## 2016-05-05 DIAGNOSIS — I5023 Acute on chronic systolic (congestive) heart failure: Secondary | ICD-10-CM

## 2016-05-05 DIAGNOSIS — F419 Anxiety disorder, unspecified: Secondary | ICD-10-CM | POA: Diagnosis present

## 2016-05-05 DIAGNOSIS — I5021 Acute systolic (congestive) heart failure: Secondary | ICD-10-CM | POA: Diagnosis not present

## 2016-05-05 DIAGNOSIS — I200 Unstable angina: Secondary | ICD-10-CM

## 2016-05-05 DIAGNOSIS — I10 Essential (primary) hypertension: Secondary | ICD-10-CM | POA: Diagnosis not present

## 2016-05-05 DIAGNOSIS — I25110 Atherosclerotic heart disease of native coronary artery with unstable angina pectoris: Secondary | ICD-10-CM | POA: Diagnosis not present

## 2016-05-05 DIAGNOSIS — E781 Pure hyperglyceridemia: Secondary | ICD-10-CM | POA: Diagnosis present

## 2016-05-05 DIAGNOSIS — Z8249 Family history of ischemic heart disease and other diseases of the circulatory system: Secondary | ICD-10-CM | POA: Diagnosis not present

## 2016-05-05 DIAGNOSIS — N183 Chronic kidney disease, stage 3 (moderate): Secondary | ICD-10-CM | POA: Diagnosis not present

## 2016-05-05 DIAGNOSIS — I5043 Acute on chronic combined systolic (congestive) and diastolic (congestive) heart failure: Secondary | ICD-10-CM | POA: Diagnosis not present

## 2016-05-05 DIAGNOSIS — I110 Hypertensive heart disease with heart failure: Secondary | ICD-10-CM | POA: Diagnosis not present

## 2016-05-05 DIAGNOSIS — E785 Hyperlipidemia, unspecified: Secondary | ICD-10-CM | POA: Diagnosis not present

## 2016-05-05 DIAGNOSIS — R0602 Shortness of breath: Secondary | ICD-10-CM | POA: Diagnosis not present

## 2016-05-05 DIAGNOSIS — I2510 Atherosclerotic heart disease of native coronary artery without angina pectoris: Secondary | ICD-10-CM | POA: Diagnosis not present

## 2016-05-05 DIAGNOSIS — J189 Pneumonia, unspecified organism: Secondary | ICD-10-CM | POA: Diagnosis present

## 2016-05-05 HISTORY — PX: LEFT HEART CATH AND CORONARY ANGIOGRAPHY: CATH118249

## 2016-05-05 LAB — BASIC METABOLIC PANEL
Anion gap: 9 (ref 5–15)
Anion gap: 6 (ref 5–15)
BUN: 41 mg/dL — ABNORMAL HIGH (ref 6–20)
BUN: 40 mg/dL — ABNORMAL HIGH (ref 6–20)
CALCIUM: 9.9 mg/dL (ref 8.9–10.3)
CHLORIDE: 106 mmol/L (ref 101–111)
CO2: 20 mmol/L — AB (ref 22–32)
CO2: 23 mmol/L (ref 22–32)
CREATININE: 1.2 mg/dL — AB (ref 0.44–1.00)
Calcium: 9.7 mg/dL (ref 8.9–10.3)
Chloride: 104 mmol/L (ref 101–111)
Creatinine, Ser: 1.16 mg/dL — ABNORMAL HIGH (ref 0.44–1.00)
GFR calc Af Amer: 46 mL/min — ABNORMAL LOW (ref >60)
GFR calc non Af Amer: 38 mL/min — ABNORMAL LOW (ref >60)
GFR calc non Af Amer: 40 mL/min — ABNORMAL LOW (ref >60)
GFR, EST AFRICAN AMERICAN: 44 mL/min — AB (ref >60)
Glucose, Bld: 166 mg/dL — ABNORMAL HIGH (ref 65–99)
Glucose, Bld: 122 mg/dL — ABNORMAL HIGH (ref 65–99)
POTASSIUM: 4.3 mmol/L (ref 3.5–5.1)
Potassium: 4.3 mmol/L (ref 3.5–5.1)
SODIUM: 133 mmol/L — AB (ref 135–145)
SODIUM: 135 mmol/L (ref 135–145)

## 2016-05-05 LAB — CBC
HCT: 37.9 % (ref 35.0–47.0)
HEMATOCRIT: 34.9 % — AB (ref 35.0–47.0)
HEMOGLOBIN: 11.7 g/dL — AB (ref 12.0–16.0)
Hemoglobin: 12.2 g/dL (ref 12.0–16.0)
MCH: 27.8 pg (ref 26.0–34.0)
MCH: 28.9 pg (ref 26.0–34.0)
MCHC: 32.1 g/dL (ref 32.0–36.0)
MCHC: 33.5 g/dL (ref 32.0–36.0)
MCV: 86.6 fL (ref 80.0–100.0)
MCV: 86.3 fL (ref 80.0–100.0)
PLATELETS: 349 10*3/uL (ref 150–440)
Platelets: 270 10*3/uL (ref 150–440)
RBC: 4.37 MIL/uL (ref 3.80–5.20)
RBC: 4.04 MIL/uL (ref 3.80–5.20)
RDW: 15.4 % — ABNORMAL HIGH (ref 11.5–14.5)
RDW: 15.8 % — ABNORMAL HIGH (ref 11.5–14.5)
WBC: 14.2 10*3/uL — AB (ref 3.6–11.0)
WBC: 10.6 10*3/uL (ref 3.6–11.0)

## 2016-05-05 LAB — BLOOD GAS, ARTERIAL
ACID-BASE DEFICIT: 3.9 mmol/L — AB (ref 0.0–2.0)
BICARBONATE: 20.8 mmol/L (ref 20.0–28.0)
Delivery systems: POSITIVE
Expiratory PAP: 5
FIO2: 0.5
Inspiratory PAP: 10
MECHANICAL RATE: 8
O2 SAT: 98.5 %
PATIENT TEMPERATURE: 37
PO2 ART: 117 mmHg — AB (ref 83.0–108.0)
pCO2 arterial: 36 mmHg (ref 32.0–48.0)
pH, Arterial: 7.37 (ref 7.350–7.450)

## 2016-05-05 LAB — MAGNESIUM: MAGNESIUM: 2.2 mg/dL (ref 1.7–2.4)

## 2016-05-05 LAB — TROPONIN I: TROPONIN I: 3.42 ng/mL — AB (ref <0.03)

## 2016-05-05 LAB — PHOSPHORUS: PHOSPHORUS: 3.7 mg/dL (ref 2.5–4.6)

## 2016-05-05 LAB — BRAIN NATRIURETIC PEPTIDE: B Natriuretic Peptide: 1843 pg/mL — ABNORMAL HIGH (ref 0.0–100.0)

## 2016-05-05 LAB — GLUCOSE, CAPILLARY: Glucose-Capillary: 139 mg/dL — ABNORMAL HIGH (ref 65–99)

## 2016-05-05 SURGERY — LEFT HEART CATH AND CORONARY ANGIOGRAPHY
Anesthesia: Moderate Sedation

## 2016-05-05 MED ORDER — SODIUM CHLORIDE 0.9 % IV SOLN
INTRAVENOUS | Status: DC
Start: 2016-05-05 — End: 2016-05-05
  Administered 2016-05-05: 04:00:00 via INTRAVENOUS

## 2016-05-05 MED ORDER — IOPAMIDOL (ISOVUE-300) INJECTION 61%
INTRAVENOUS | Status: DC | PRN
  Administered 2016-05-05: 40 mL via INTRA_ARTERIAL

## 2016-05-05 MED ORDER — MONTELUKAST SODIUM 10 MG PO TABS: 10.0000 mg | ORAL_TABLET | Freq: Every day | ORAL | Status: DC

## 2016-05-05 MED ORDER — ALPRAZOLAM 0.25 MG PO TABS: 0.2500 mg | ORAL_TABLET | Freq: Two times a day (BID) | ORAL | Status: DC | PRN

## 2016-05-05 MED ORDER — MIDAZOLAM HCL 2 MG/2ML IJ SOLN
INTRAMUSCULAR | Status: AC
  Filled 2016-05-05: qty 2

## 2016-05-05 MED ORDER — ESCITALOPRAM OXALATE 10 MG PO TABS
10.0000 mg | ORAL_TABLET | Freq: Every day | ORAL | Status: DC
  Administered 2016-05-05 – 2016-05-06 (×2): 10 mg via ORAL
  Filled 2016-05-05 (×2): qty 1

## 2016-05-05 MED ORDER — ENOXAPARIN SODIUM 30 MG/0.3ML ~~LOC~~ SOLN: 30.0000 mg | SUBCUTANEOUS | Status: DC

## 2016-05-05 MED ORDER — ASPIRIN 81 MG PO CHEW
81.0000 mg | CHEWABLE_TABLET | Freq: Every day | ORAL | Status: DC
  Administered 2016-05-05 – 2016-05-06 (×2): 81 mg via ORAL
  Filled 2016-05-05 (×2): qty 1

## 2016-05-05 MED ORDER — ALBUTEROL SULFATE (2.5 MG/3ML) 0.083% IN NEBU: 3.0000 mL | INHALATION_SOLUTION | Freq: Four times a day (QID) | RESPIRATORY_TRACT | Status: DC | PRN

## 2016-05-05 MED ORDER — HEPARIN SODIUM (PORCINE) 1000 UNIT/ML IJ SOLN
INTRAMUSCULAR | Status: AC
  Filled 2016-05-05: qty 1

## 2016-05-05 MED ORDER — NITROGLYCERIN 0.4 MG SL SUBL: 0.4000 mg | SUBLINGUAL_TABLET | SUBLINGUAL | Status: DC | PRN

## 2016-05-05 MED ORDER — NITROGLYCERIN 0.3 MG SL SUBL: 0.3000 mg | SUBLINGUAL_TABLET | SUBLINGUAL | Status: DC | PRN

## 2016-05-05 MED ORDER — SODIUM CHLORIDE 0.9% FLUSH: 3.0000 mL | INTRAVENOUS | Status: DC | PRN

## 2016-05-05 MED ORDER — SODIUM CHLORIDE 0.9 % IV SOLN: 250.0000 mL | INTRAVENOUS | Status: DC | PRN

## 2016-05-05 MED ORDER — LIDOCAINE HCL (PF) 1 % IJ SOLN
INTRAMUSCULAR | Status: AC
  Filled 2016-05-05: qty 30

## 2016-05-05 MED ORDER — CALCIUM CARBONATE-VITAMIN D 500-200 MG-UNIT PO TABS
2.0000 | ORAL_TABLET | Freq: Every day | ORAL | Status: DC
  Administered 2016-05-05 – 2016-05-06 (×2): 2 via ORAL
  Filled 2016-05-05 (×2): qty 2

## 2016-05-05 MED ORDER — ISOSORBIDE MONONITRATE ER 30 MG PO TB24
30.0000 mg | ORAL_TABLET | Freq: Every day | ORAL | Status: DC
  Administered 2016-05-05 – 2016-05-06 (×2): 30 mg via ORAL
  Filled 2016-05-05 (×2): qty 1

## 2016-05-05 MED ORDER — FENTANYL CITRATE (PF) 100 MCG/2ML IJ SOLN
INTRAMUSCULAR | Status: AC
  Filled 2016-05-05: qty 2

## 2016-05-05 MED ORDER — ACETAMINOPHEN 325 MG PO TABS: 650.0000 mg | ORAL_TABLET | ORAL | Status: DC | PRN

## 2016-05-05 MED ORDER — HEPARIN (PORCINE) IN NACL 2-0.9 UNIT/ML-% IJ SOLN
INTRAMUSCULAR | Status: AC
  Filled 2016-05-05: qty 500

## 2016-05-05 MED ORDER — HEPARIN SODIUM (PORCINE) 1000 UNIT/ML IJ SOLN
INTRAMUSCULAR | Status: DC | PRN
  Administered 2016-05-05: 5000 [IU] via INTRAVENOUS

## 2016-05-05 MED ORDER — ORAL CARE MOUTH RINSE
15.0000 mL | Freq: Two times a day (BID) | OROMUCOSAL | Status: DC
Start: 2016-05-05 — End: 2016-05-05

## 2016-05-05 MED ORDER — TICAGRELOR 90 MG PO TABS
90.0000 mg | ORAL_TABLET | Freq: Two times a day (BID) | ORAL | Status: DC
  Administered 2016-05-05 – 2016-05-06 (×4): 90 mg via ORAL
  Filled 2016-05-05 (×4): qty 1

## 2016-05-05 MED ORDER — FUROSEMIDE 40 MG PO TABS
40.0000 mg | ORAL_TABLET | Freq: Two times a day (BID) | ORAL | Status: DC
  Administered 2016-05-05: 40 mg via ORAL
  Filled 2016-05-05: qty 1

## 2016-05-05 MED ORDER — FLUTICASONE PROPIONATE 50 MCG/ACT NA SUSP
2.0000 | Freq: Every day | NASAL | Status: DC | PRN
  Filled 2016-05-05: qty 16

## 2016-05-05 MED ORDER — ONDANSETRON HCL 4 MG/2ML IJ SOLN: 4.0000 mg | Freq: Four times a day (QID) | INTRAMUSCULAR | Status: DC | PRN

## 2016-05-05 MED ORDER — SODIUM CHLORIDE 0.9% FLUSH
3.0000 mL | Freq: Two times a day (BID) | INTRAVENOUS | Status: DC
  Administered 2016-05-05 – 2016-05-06 (×3): 3 mL via INTRAVENOUS

## 2016-05-05 MED ORDER — FUROSEMIDE 10 MG/ML IJ SOLN
40.0000 mg | Freq: Once | INTRAMUSCULAR | Status: AC
  Administered 2016-05-05: 40 mg via INTRAVENOUS
  Filled 2016-05-05: qty 4

## 2016-05-05 MED ORDER — NITROGLYCERIN 5 MG/ML IV SOLN
INTRAVENOUS | Status: AC
  Filled 2016-05-05: qty 10

## 2016-05-05 MED ORDER — BISOPROLOL FUMARATE 5 MG PO TABS
10.0000 mg | ORAL_TABLET | Freq: Every day | ORAL | Status: DC
  Administered 2016-05-05 – 2016-05-06 (×2): 10 mg via ORAL
  Filled 2016-05-05: qty 1
  Filled 2016-05-05: qty 2

## 2016-05-05 MED ORDER — FUROSEMIDE 40 MG PO TABS
40.0000 mg | ORAL_TABLET | Freq: Every day | ORAL | Status: DC
  Administered 2016-05-06: 40 mg via ORAL
  Filled 2016-05-05: qty 1

## 2016-05-05 MED ORDER — VERAPAMIL HCL 2.5 MG/ML IV SOLN
INTRAVENOUS | Status: AC
  Filled 2016-05-05: qty 2

## 2016-05-05 SURGICAL SUPPLY — 9 items
CATH HEARTRAIL 6F IL3.5 (CATHETERS) ×2 IMPLANT
CATH INFINITI 5FR ANG PIGTAIL (CATHETERS) IMPLANT
DEVICE INFLAT 30 PLUS (MISCELLANEOUS) ×2 IMPLANT
DEVICE RAD TR BAND REGULAR (VASCULAR PRODUCTS) ×2 IMPLANT
GLIDESHEATH SLEND SS 6F .021 (SHEATH) ×2 IMPLANT
KIT MANI 3VAL PERCEP (MISCELLANEOUS) ×3 IMPLANT
PACK CARDIAC CATH (CUSTOM PROCEDURE TRAY) ×3 IMPLANT
WIRE HITORQ VERSACORE ST 145CM (WIRE) ×2 IMPLANT
WIRE ROSEN-J .035X260CM (WIRE) ×2 IMPLANT

## 2016-05-05 NOTE — ED Notes (Signed)
This RN as well as Sharen Hones, Ena Dawley RN, Starlyn Skeans RN and Heritage manager in rm with pt helping with Code STEMI as well EDT Alissa and respiratory therapist Maudie Mercury.

## 2016-05-05 NOTE — ED Provider Notes (Signed)
[facility] Emergency Department Provider Note    First MD Initiated Contact with Patient 05/05/16 0125     (approximate)  I have reviewed the triage vital signs and the nursing notes.  History Limited secondary to rest or distress HISTORY  Chief Complaint Shortness of Breath    HPI Wanda Davenport is a 83 y.o. female with below list of chronic medical conditions including recent ST elevation myocardial infarction on 04/26/2016 requiring proximal LAD stent placement. presents via EMS in respiratory distress. Patient states her sore distress started shortly before arrival to the emergency department and progressively worsened. Patient presents to the emergency department in apparent rest or distress speaking in 3 word phrases. Patient denies any chest pain   Past Medical History:  Diagnosis Date  . Allergic rhinitis   . Anxiety   . Arthritis   . Atypical chest pain   . Bronchitis 01/14/2016  . Chronic diastolic CHF (congestive heart failure) (HCC)    Echo 06/2010 EF 60-65%, no RWMAs, grade 1 diastolic dysfunction, mild LAE, PASP 23mmHg.  Marland Kitchen DIVERTICULOSIS, COLON   . HEMORRHOIDS, INTERNAL   . Hepatic steatosis   . Hyperlipidemia   . HYPERTENSION   . LBBB (left bundle branch block)    a. present on ECG 06/2010  . Osteopenia   . Pre-diabetes    A1c 6.0    Patient Active Problem List   Diagnosis Date Noted  . Acute ST elevation myocardial infarction (STEMI) involving left anterior descending (LAD) coronary artery ([place]) 04/26/2016  . STEMI (ST elevation myocardial infarction) ([place]) 04/26/2016  . Knee pain, bilateral 04/10/2016  . Medication monitoring encounter 01/26/2016  . Intolerance of drug- statins 01/25/2016  . Counseling regarding end of life decision making 01/25/2016  . Hypertriglyceridemia 12/13/2015  . Low serum HDL 12/13/2015  . B12 deficiency 12/13/2015  . Claudication of left lower extremity ([place]) 12/13/2015  . Vitamin D deficiency  11/06/2015  . Adjustment disorder with mixed anxiety and depressed mood 11/06/2015  . Basal cell carcinoma 10/31/2015  . Environmental and seasonal allergies 10/31/2015  . Reactive airway disease- only spring and fall due to allergies 10/31/2015  . GAD (generalized anxiety disorder) 10/13/2015  . Atypical chest pain 04/16/2011  . Chronic diastolic heart failure ([place]) 04/16/2011  . Hyperlipidemia   . Pre-diabetes   . chronic LBBB (left bundle branch block)- since 2012   . Generalized OA   . Allergic rhinitis   . DIVERTICULOSIS, COLON 12/03/2007  . Hypertension with CHF ( diastolic dysf grade I)  56/38/7564    Past Surgical History:  Procedure Laterality Date  . ABDOMINAL HYSTERECTOMY    . BREAST SURGERY    . CATARACT EXTRACTION W/PHACO Left 05/12/2014   Procedure: CATARACT EXTRACTION PHACO AND INTRAOCULAR LENS PLACEMENT (IOC);  Surgeon: Leandrew Koyanagi, MD;  Location: [place];  Service: Ophthalmology;  Laterality: Left;  . COLONOSCOPY     a. 2010  . CORONARY STENT INTERVENTION N/A 04/26/2016   Procedure: Coronary Stent Intervention;  Surgeon: Isaias Cowman, MD;  Location: [place] CV LAB;  Service: Cardiovascular;  Laterality: N/A;  . LEFT HEART CATH AND CORONARY ANGIOGRAPHY N/A 04/26/2016   Procedure: Left Heart Cath and Coronary Angiography;  Surgeon: Isaias Cowman, MD;  Location: [place] CV LAB;  Service: Cardiovascular;  Laterality: N/A;  . TONSILLECTOMY      Prior to Admission medications   Medication Sig Start Date End Date Taking? Authorizing Provider  albuterol (PROVENTIL HFA;VENTOLIN HFA) 108 (90 BASE) MCG/ACT inhaler  Inhale 2 puffs into the lungs every 6 (six) hours as needed. Wheezing or shortness of breath   Yes [provider]  ALPRAZolam (XANAX) 0.25 MG tablet Take 1 tablet (0.25 mg total) by mouth 2 (two) times daily as needed for anxiety. 04/30/16  Yes Fritzi Mandes, MD  aspirin 325 MG EC tablet Take 325 mg by mouth daily.    Yes [provider]  bisoprolol (ZEBETA) 10 MG tablet Take 1 tablet (10 mg total) by mouth daily. 03/29/16  Yes Bess, Valetta Fuller D, NP  escitalopram (LEXAPRO) 10 MG tablet TAKE 1 TABLET (10 MG TOTAL) BY MOUTH DAILY. 04/27/16  Yes Opalski, Deborah, DO  fluticasone (FLONASE) 50 MCG/ACT nasal spray Place 2 sprays into the nose daily as needed. Nasal congestion Rarely used   Yes [provider]  furosemide (LASIX) 20 MG tablet Take 1 tablet (20 mg total) by mouth every other day. 05/01/16  Yes Fritzi Mandes, MD  isosorbide mononitrate (IMDUR) 30 MG 24 hr tablet Take 1 tablet (30 mg total) by mouth daily. 04/30/16  Yes Fritzi Mandes, MD  lisinopril (PRINIVIL,ZESTRIL) 20 MG tablet Take 1 tablet by mouth daily. 02/27/16 02/26/17 Yes [provider]  montelukast (SINGULAIR) 10 MG tablet Take 1 tablet (10 mg total) by mouth at bedtime. For wheeze/SOB 04/27/16  Yes Opalski, Neoma Laming, DO  nitroGLYCERIN (NITROSTAT) 0.3 MG SL tablet Place 0.3 mg under the tongue every 5 (five) minutes as needed for chest pain.   Yes [provider]  ticagrelor (BRILINTA) 90 MG TABS tablet Take 1 tablet (90 mg total) by mouth 2 (two) times daily. 04/29/16  Yes Fritzi Mandes, MD  Calcium Carbonate-Vitamin D 600-400 MG-UNIT tablet Take 2 tablets by mouth daily. Patient not taking: Reported on 04/26/2016 01/25/16   Mellody Dance, DO    Allergies Fenofibrate; Statins; and Sulfonamide derivatives  Family History  Problem Relation Age of Onset  . Colon cancer Sister   . Melanoma Sister   . Hyperlipidemia Brother   . Hypertension Mother   . Lupus Mother     died 47 from surgical complications  . Alzheimer's disease Brother     unknown  . Kidney disease Father     died 52  . Hypertension Father   . Alcohol abuse Father     Social History Social History  Substance Use Topics  . Smoking status: Former Smoker    Packs/day: 0.25    Years: 20.00    Types: Cigarettes    Quit date: 01/02/1971  . Smokeless  tobacco: Never Used     Comment: smoked a few cigarettes/day x 20 yrs - quit @ age 79.  Marland Kitchen Alcohol use No    Review of Systems Constitutional: No fever/chills Eyes: No visual changes. ENT: No sore throat. Cardiovascular: Denies chest pain. Respiratory: Positive for shortness of breath. Gastrointestinal: No abdominal pain.  No nausea, no vomiting.  No diarrhea.  No constipation. Genitourinary: Negative for dysuria. Musculoskeletal: Negative for back pain. Integumentary: Negative for rash. Neurological: Negative for headaches, focal weakness or numbness.   ____________________________________________   PHYSICAL EXAM:  VITAL SIGNS: ED Triage Vitals  Enc Vitals Group     BP 05/05/16 0137 (!) 187/102     Pulse Rate 05/05/16 0137 82     Resp 05/05/16 0137 (!) 24     Temp 05/05/16 0137 (!) 96.1 F (35.6 C)     Temp Source 05/05/16 0137 Axillary     SpO2 05/05/16 0137 95 %     Weight 05/05/16  0131 165 lb (74.8 kg)     Height 05/05/16 0131 5\' 1"  (1.549 m)     Head Circumference --      Peak Flow --      Pain Score --      Pain Loc --      Pain Edu? --      Excl. in [place]? --     Constitutional: Alert and oriented. Apparent respiratory distress  Eyes: Conjunctivae are normal. PERRL. EOMI. Head: Atraumatic. Ears:  Healthy appearing ear canals and TMs bilaterally Nose: No congestion/rhinnorhea. Mouth/Throat: Mucous membranes are moist. Oropharynx non-erythematous. Neck: No stridor.   Cardiovascular: Tachycardia, regular rhythm. Good peripheral circulation. Grossly normal heart sounds. Respiratory: Tachypnea  No retractions. Lungs CTAB. Gastrointestinal: Soft and nontender. No distention.  Musculoskeletal: No lower extremity tenderness nor edema. No gross deformities of extremities. Neurologic:  Normal speech and language. No gross focal neurologic deficits are appreciated.  Skin:  Skin is warm, dry and intact. No rash noted. Psychiatric: Mood and affect are normal. Speech and  behavior are normal.  ____________________________________________   LABS (all labs ordered are listed, but only abnormal results are displayed)  Labs Reviewed  BASIC METABOLIC PANEL - Abnormal; Notable for the following:       Result Value   Sodium 133 (*)    CO2 20 (*)    Glucose, Bld 166 (*)    BUN 41 (*)    Creatinine, Ser 1.20 (*)    GFR calc non Af Amer 38 (*)    GFR calc Af Amer 44 (*)    All other components within normal limits  CBC - Abnormal; Notable for the following:    WBC 14.2 (*)    RDW 15.4 (*)    All other components within normal limits  TROPONIN I - Abnormal; Notable for the following:    Troponin I 3.42 (*)    All other components within normal limits  BLOOD GAS, ARTERIAL - Abnormal; Notable for the following:    pO2, Arterial 117 (*)    Acid-base deficit 3.9 (*)    All other components within normal limits   ____________________________________________  EKG  ED ECG REPORT I, [place] N Ilyanna Baillargeon, the attending physician, personally viewed and interpreted this ECG.   Date: 05/08/2016  EKG Time: 1:137AM  Rate: 81  Rhythm: Normal sinus rhythm with left bundle-branch block ST segment elevation V1 through V3  Axis: Left axis deviation  Intervals: Normal  ST&T Change: 4-5 mm ST segment elevation V1 through V3  ____________________________________________  RADIOLOGY I, [place] N Kirk Sampley, personally viewed and evaluated these images (plain radiographs) as part of my medical decision making, as well as reviewing the written report by the radiologist.  Dg Chest Port 1 View  Result Date: 05/05/2016 CLINICAL DATA:  Acute onset shortness of breath. History of hypertension, CHF. EXAM: PORTABLE CHEST 1 VIEW COMPARISON:  Chest radiograph March 29, 2016 FINDINGS: The cardiac silhouette is mildly enlarged and unchanged. Mild chronic interstitial changes without focal consolidation. Trace LEFT pleural effusion. No pneumothorax. Osteopenia. Soft tissue planes are  nonsuspicious. Mildly calcified aortic knob. IMPRESSION: Stable examination: Mild cardiomegaly, chronic interstitial changes with trace LEFT pleural effusion. Electronically Signed   By: Elon Alas M.D.   On: 05/05/2016 02:07    ____________________________________________   PROCEDURES  Critical Care performed: CRITICAL CARE Performed by: Gregor Hams   Total critical care time: 40 minutes  Critical care time was exclusive of separately billable procedures and treating other patients.  Critical care was necessary to treat or prevent imminent or life-threatening deterioration.  Critical care was time spent personally by me on the following activities: development of treatment plan with patient and/or surrogate as well as nursing, discussions with consultants, evaluation of patient's response to treatment, examination of patient, obtaining history from patient or surrogate, ordering and performing treatments and interventions, ordering and review of laboratory studies, ordering and review of radiographic studies, pulse oximetry and re-evaluation of patient's condition.    Procedures   ____________________________________________   INITIAL IMPRESSION / ASSESSMENT AND PLAN / ED COURSE  Pertinent labs & imaging results that were available during my care of the patient were reviewed by me and considered in my medical decision making (see chart for details).    83 year old female presenting to the emergency department with respiratory distress status post ST elevation MI 1 week prior. Given ST segment elevation noted on EKG concerning for possible acute stent closure and a such EKGs were transmitted to Dr. Audelia Acton STEMI protocol initiated following Dr.Arrida's consultation.  Given rest or distress patient was placed on BiPAP immediately upon arrival to the emergency department in addition patient was given Lasix 40 mg. Patient's rest her status markedly improved before Dr.  Audelia Acton took the patient to the cath Lab    ____________________________________________  FINAL CLINICAL IMPRESSION(S) / ED DIAGNOSES  Final diagnoses:  ST elevation myocardial infarction involving left anterior descending (LAD) coronary artery ([place])     MEDICATIONS GIVEN DURING THIS VISIT:  Medications  furosemide (LASIX) injection 40 mg (40 mg Intravenous Given 05/05/16 0158)     NEW OUTPATIENT MEDICATIONS STARTED DURING THIS VISIT:  New Prescriptions   No medications on file    Modified Medications   No medications on file    Discontinued Medications   No medications on file     Note:  This document was prepared using Dragon voice recognition software and may include unintentional dictation errors.    Gregor Hams, MD 05/08/16 [phone]

## 2016-05-05 NOTE — Progress Notes (Signed)
Patient transferred into room 259 from ICU. Telemetry box verified with Gerald Stabs NT. Skin checked with Vicenta Dunning RN. No complaints at this time. Family at bedside. Will continue to monitor.

## 2016-05-05 NOTE — ED Notes (Addendum)
EDP requests pt to be on Bipap, respiratory called.

## 2016-05-05 NOTE — H&P (Signed)
Wanda Davenport is an 83 y.o. female.   Chief Complaint: Shortness of breath HPI: The patient with past medical history significant for coronary artery disease status post stent placement to her LAD 1 week ago, as well as CHF and hypertension presents emergency department complaining of shortness of breath. The patient reports that symptom onset was acute prior to coming to the emergency department. STEMI protocol was initiated in the emergency department but subsequent heart catheterization showed patent stent. The patient was subsequently transferred to the ICU and was recovering at the time of my interview. She admits to feeling general malaise in the week preceding her heart attack. Currently her only complaint is shortness of breath with exertion. She denies chest pain.  Past Medical History:  Diagnosis Date  . Allergic rhinitis   . Anxiety   . Arthritis   . Atypical chest pain   . Bronchitis 01/14/2016  . Chronic diastolic CHF (congestive heart failure) (HCC)    Echo 06/2010 EF 60-65%, no RWMAs, grade 1 diastolic dysfunction, mild LAE, PASP 20mHg.  .Marland KitchenDIVERTICULOSIS, COLON   . HEMORRHOIDS, INTERNAL   . Hepatic steatosis   . Hyperlipidemia   . HYPERTENSION   . LBBB (left bundle branch block)    a. present on ECG 06/2010  . Osteopenia   . Pre-diabetes    A1c 6.0    Past Surgical History:  Procedure Laterality Date  . ABDOMINAL HYSTERECTOMY    . BREAST SURGERY    . CATARACT EXTRACTION W/PHACO Left 05/12/2014   Procedure: CATARACT EXTRACTION PHACO AND INTRAOCULAR LENS PLACEMENT (IOC);  Surgeon: CLeandrew Koyanagi MD;  [place] [facility]  Service: Ophthalmology;  Laterality: Left;  . COLONOSCOPY     a. 2010  . CORONARY STENT INTERVENTION N/A 04/26/2016   Procedure: Coronary Stent Intervention;  Surgeon: AIsaias Cowman MD;  [facility];  Service: Cardiovascular;  Laterality: N/A;  . LEFT HEART CATH AND CORONARY ANGIOGRAPHY N/A 04/26/2016   Procedure: Left Heart Cath and Coronary Angiography;  Surgeon: AIsaias Cowman MD;  [facility];  Service: Cardiovascular;  Laterality: N/A;  . TONSILLECTOMY      Family History  Problem Relation Age of Onset  . Colon cancer Sister   . Melanoma Sister   . Hyperlipidemia Brother   . Hypertension Mother   . Lupus Mother     died 838from surgical complications  . Alzheimer's disease Brother     unknown  . Kidney disease Father     died 857 . Hypertension Father   . Alcohol abuse Father    Social History:  reports that she quit smoking about 45 years ago. Her smoking use included Cigarettes. She has a 5.00 pack-year smoking history. She has never used smokeless tobacco. She reports that she does not drink alcohol or use drugs.  Allergies:  Allergies  Allergen Reactions  . Fenofibrate Other (See Comments)    Myalgias/gi upset   . Statins Other (See Comments)    Myalgias   . Sulfonamide Derivatives Nausea And Vomiting    REACTION: nausea    Medications Prior to Admission  Medication Sig Dispense Refill  . albuterol (PROVENTIL HFA;VENTOLIN HFA) 108 (90 BASE) MCG/ACT inhaler Inhale 2 puffs into the lungs every 6 (six) hours as needed. Wheezing or shortness of breath    . ALPRAZolam (XANAX) 0.25 MG tablet Take 1 tablet (0.25 mg total) by mouth 2 (two) times daily as needed for anxiety. 30 tablet 0  . aspirin  325 MG EC tablet Take 325 mg by mouth daily.    . bisoprolol (ZEBETA) 10 MG tablet Take 1 tablet (10 mg total) by mouth daily. 90 tablet 0  . escitalopram (LEXAPRO) 10 MG tablet TAKE 1 TABLET (10 MG TOTAL) BY MOUTH DAILY. 90 tablet 1  . fluticasone (FLONASE) 50 MCG/ACT nasal spray Place 2 sprays into the nose daily as needed. Nasal congestion Rarely used    . furosemide (LASIX) 20 MG tablet Take 1 tablet (20 mg total) by mouth every other day. 30 tablet 0  . isosorbide mononitrate (IMDUR) 30 MG 24 hr tablet Take 1 tablet (30 mg total) by mouth daily. 30  tablet 1  . lisinopril (PRINIVIL,ZESTRIL) 20 MG tablet Take 1 tablet by mouth daily.    . montelukast (SINGULAIR) 10 MG tablet Take 1 tablet (10 mg total) by mouth at bedtime. For wheeze/SOB 90 tablet 0  . nitroGLYCERIN (NITROSTAT) 0.3 MG SL tablet Place 0.3 mg under the tongue every 5 (five) minutes as needed for chest pain.    . ticagrelor (BRILINTA) 90 MG TABS tablet Take 1 tablet (90 mg total) by mouth 2 (two) times daily. 60 tablet 1  . Calcium Carbonate-Vitamin D 600-400 MG-UNIT tablet Take 2 tablets by mouth daily. (Patient not taking: Reported on 04/26/2016) 60 tablet 11    Results for orders placed or performed during the hospital encounter of 05/05/16 (from the past 48 hour(s))  Basic metabolic panel     Status: Abnormal   Collection Time: 05/05/16  1:35 AM  Result Value Ref Range   Sodium 133 (L) 135 - 145 mmol/L   Potassium 4.3 3.5 - 5.1 mmol/L   Chloride 104 101 - 111 mmol/L   CO2 20 (L) 22 - 32 mmol/L   Glucose, Bld 166 (H) 65 - 99 mg/dL   BUN 41 (H) 6 - 20 mg/dL   Creatinine, Ser 1.20 (H) 0.44 - 1.00 mg/dL   Calcium 9.9 8.9 - 10.3 mg/dL   GFR calc non Af Amer 38 (L) >60 mL/min   GFR calc Af Amer 44 (L) >60 mL/min    Comment: (NOTE) The eGFR has been calculated using the CKD EPI equation. This calculation has not been validated in all clinical situations. eGFR's persistently <60 mL/min signify possible Chronic Kidney Disease.    Anion gap 9 5 - 15  CBC     Status: Abnormal   Collection Time: 05/05/16  1:35 AM  Result Value Ref Range   WBC 14.2 (H) 3.6 - 11.0 K/uL   RBC 4.37 3.80 - 5.20 MIL/uL   Hemoglobin 12.2 12.0 - 16.0 g/dL   HCT 37.9 35.0 - 47.0 %   MCV 86.6 80.0 - 100.0 fL   MCH 27.8 26.0 - 34.0 pg   MCHC 32.1 32.0 - 36.0 g/dL   RDW 15.4 (H) 11.5 - 14.5 %   Platelets 349 150 - 440 K/uL  Troponin I     Status: Abnormal   Collection Time: 05/05/16  1:35 AM  Result Value Ref Range   Troponin I 3.42 (HH) <0.03 ng/mL    Comment: CRITICAL RESULT CALLED TO,  READ BACK BY AND VERIFIED WITH MICHELE MORTON AT 0214 05/05/16.RWW/PMH  Blood gas, arterial ([facility] & [facility] ONLY)     Status: Abnormal   Collection Time: 05/05/16  1:41 AM  Result Value Ref Range   FIO2 0.50    Delivery systems BILEVEL POSITIVE AIRWAY PRESSURE    Inspiratory PAP 10    Expiratory PAP 5.0  pH, Arterial 7.37 7.350 - 7.450   pCO2 arterial 36 32.0 - 48.0 mmHg   pO2, Arterial 117 (H) 83.0 - 108.0 mmHg   Bicarbonate 20.8 20.0 - 28.0 mmol/L   Acid-base deficit 3.9 (H) 0.0 - 2.0 mmol/L   O2 Saturation 98.5 %   Patient temperature 37.0    Collection site LEFT RADIAL    Sample type ARTERIAL DRAW    Allens test (pass/fail) PASS PASS   Mechanical Rate 8   CBC     Status: Abnormal   Collection Time: 05/05/16  5:27 AM  Result Value Ref Range   WBC 10.6 3.6 - 11.0 K/uL   RBC 4.04 3.80 - 5.20 MIL/uL   Hemoglobin 11.7 (L) 12.0 - 16.0 g/dL   HCT 34.9 (L) 35.0 - 47.0 %   MCV 86.3 80.0 - 100.0 fL   MCH 28.9 26.0 - 34.0 pg   MCHC 33.5 32.0 - 36.0 g/dL   RDW 15.8 (H) 11.5 - 14.5 %   Platelets 270 150 - 440 K/uL  Basic metabolic panel     Status: Abnormal   Collection Time: 05/05/16  5:27 AM  Result Value Ref Range   Sodium 135 135 - 145 mmol/L   Potassium 4.3 3.5 - 5.1 mmol/L   Chloride 106 101 - 111 mmol/L   CO2 23 22 - 32 mmol/L   Glucose, Bld 122 (H) 65 - 99 mg/dL   BUN 40 (H) 6 - 20 mg/dL   Creatinine, Ser 1.16 (H) 0.44 - 1.00 mg/dL   Calcium 9.7 8.9 - 10.3 mg/dL   GFR calc non Af Amer 40 (L) >60 mL/min   GFR calc Af Amer 46 (L) >60 mL/min    Comment: (NOTE) The eGFR has been calculated using the CKD EPI equation. This calculation has not been validated in all clinical situations. eGFR's persistently <60 mL/min signify possible Chronic Kidney Disease.    Anion gap 6 5 - 15  Magnesium     Status: None   Collection Time: 05/05/16  5:27 AM  Result Value Ref Range   Magnesium 2.2 1.7 - 2.4 mg/dL  Phosphorus     Status: None   Collection Time: 05/05/16  5:27 AM  Result  Value Ref Range   Phosphorus 3.7 2.5 - 4.6 mg/dL   Dg Chest Port 1 View  Result Date: 05/05/2016 CLINICAL DATA:  Acute onset shortness of breath. History of hypertension, CHF. EXAM: PORTABLE CHEST 1 VIEW COMPARISON:  Chest radiograph March 29, 2016 FINDINGS: The cardiac silhouette is mildly enlarged and unchanged. Mild chronic interstitial changes without focal consolidation. Trace LEFT pleural effusion. No pneumothorax. Osteopenia. Soft tissue planes are nonsuspicious. Mildly calcified aortic knob. IMPRESSION: Stable examination: Mild cardiomegaly, chronic interstitial changes with trace LEFT pleural effusion. Electronically Signed   By: Elon Alas M.D.   On: 05/05/2016 02:07    Review of Systems  Constitutional: Negative for chills and fever.  HENT: Negative for sore throat and tinnitus.   Eyes: Negative for blurred vision and redness.  Respiratory: Positive for shortness of breath. Negative for cough.   Cardiovascular: Negative for chest pain, palpitations, orthopnea and PND.  Gastrointestinal: Negative for abdominal pain, diarrhea, nausea and vomiting.  Genitourinary: Negative for dysuria, frequency and urgency.  Musculoskeletal: Negative for joint pain and myalgias.  Skin: Negative for rash.       No lesions  Neurological: Negative for speech change, focal weakness and weakness.  Endo/Heme/Allergies: Does not bruise/bleed easily.       No  temperature intolerance  Psychiatric/Behavioral: Negative for depression and suicidal ideas.    Blood pressure 113/68, pulse 73, temperature 97.4 F (36.3 C), temperature source Axillary, resp. rate (!) 25, height _0  (1.626 m), weight 75.9 kg (167 lb 5.3 oz), SpO2 94 %. Physical Exam  Vitals reviewed. Constitutional: She is oriented to person, place, and time. She appears well-developed and well-nourished. No distress.  HENT:  Head: Normocephalic and atraumatic.  Mouth/Throat: Oropharynx is clear and moist.  Eyes: Conjunctivae and EOM  are normal. Pupils are equal, round, and reactive to light. No scleral icterus.  Neck: Normal range of motion. Neck supple. No JVD present. No tracheal deviation present. No thyromegaly present.  Cardiovascular: Normal rate, regular rhythm and normal heart sounds.  Exam reveals no gallop and no friction rub.   No murmur heard. Respiratory: Effort normal and breath sounds normal. Tachypnea noted.  GI: Soft. Bowel sounds are normal. She exhibits no distension. There is no tenderness.  Genitourinary:  Genitourinary Comments: Deferred  Lymphadenopathy:    She has no cervical adenopathy.  Neurological: She is alert and oriented to person, place, and time. No cranial nerve deficit. She exhibits normal muscle tone.  Skin: Skin is warm and dry. No rash noted. No erythema.  Psychiatric: She has a normal mood and affect. Her behavior is normal. Judgment and thought content normal.     Assessment/Plan This is a 83 year old female admitted for CHF exacerbation. 1. CHF: Acute, systolic. Etiology of shortness of breath. The patient is received Lasix IV which we will continue as needed. Also continue oral diuretic per home regimen.  2. CAD: Stable; no in-stent stenosis. Continue Brillinta and Imdur 3. Hypertension: Controlled; continue beta blocker 4. CAD: Stage III; avoid nephrotoxic agents 5. DT prophylaxis: Lovenox 6. GI prophylaxis: None The patient is a full code. Time spent on admission orders and patient care approximately 45 minutes  Harrie Foreman, MD 05/05/2016, 6:53 AM

## 2016-05-05 NOTE — ED Notes (Signed)
Pt A&O at this time. Pt talking with family that is in room. Pt continues to deny chest pain.

## 2016-05-05 NOTE — ED Notes (Signed)
Code STEMI still in place, documented under Code narrator as end however this is not correct. Code STEMI still in place.

## 2016-05-05 NOTE — ED Notes (Addendum)
At this time, waiting for medication orders from EDP.

## 2016-05-05 NOTE — ED Notes (Addendum)
Defib pads placed on pt.

## 2016-05-05 NOTE — Progress Notes (Signed)
Chart reviewed and d/w dr simonds  Will tx to floor today  agree with current plan

## 2016-05-05 NOTE — ED Notes (Signed)
Pt's family updated, waiting for cardiologist to arrive to see pt.

## 2016-05-05 NOTE — Consult Note (Signed)
CARDIOLOGY CONSULT NOTE  Patient ID: Wanda Davenport MRN: [id number] DOB/AGE: [birth year] 83 y.o.  Admit date: 05/05/2016 Referring Physician:  Dr. Owens Shark Primary Physician: Mellody Dance, DO Primary Cardiologist : Dr. Ivory Broad Reason for Consultation : Suspected stent thrombosis.  Date:  05/05/2016    Chief Complaint  Patient presents with  . Shortness of Breath      History of Present Illness: Wanda Davenport is a 83 y.o. female who presented with shortness of breath and hypoxia. I was consulted by Dr. Owens Shark for evaluation of possible stent thrombosis given her presentation and EKG changes. She has known history of hypertension, hyperlipidemia and recently diagnosed coronary artery disease. She presented on April 26 with anterior ST elevation myocardial infarction with left bundle branch block. Emergent cardiac catheterization was done by Dr.Paraschos which showed ostial LAD occlusion. This was treated successfully with PCI and drug-eluting stent placement. Ejection fraction was 35-40% with mid to distal anterior wall akinesis. The patient was discharged to a skilled nursing facility and was doing reasonably well up until last night when she had sudden onset of shortness of breath and respiratory distress. She was brought to the emergency room and was started on BiPAP. She was given one dose of IV furosemide 40 mg. EKG showed left bundle branch block with ST changes again suggestive of possible recurrent anterior ST elevation. This was compared with EKG before hospital discharge and appeared to be significantly different. The patient denies any chest pain but she has orthopnea. She was discharged home on furosemide 20 mg every other day.    Past Medical History:  Diagnosis Date  . Allergic rhinitis   . Anxiety   . Arthritis   . Atypical chest pain   . Bronchitis 01/14/2016  . Chronic diastolic CHF (congestive heart failure) (HCC)    Echo 06/2010 EF 60-65%, no RWMAs, grade 1  diastolic dysfunction, mild LAE, PASP 53mmHg.  Marland Kitchen DIVERTICULOSIS, COLON   . HEMORRHOIDS, INTERNAL   . Hepatic steatosis   . Hyperlipidemia   . HYPERTENSION   . LBBB (left bundle branch block)    a. present on ECG 06/2010  . Osteopenia   . Pre-diabetes    A1c 6.0    Past Surgical History:  Procedure Laterality Date  . ABDOMINAL HYSTERECTOMY    . BREAST SURGERY    . CATARACT EXTRACTION W/PHACO Left 05/12/2014   Procedure: CATARACT EXTRACTION PHACO AND INTRAOCULAR LENS PLACEMENT (IOC);  Surgeon: Leandrew Koyanagi, MD;  Location: [facility];  Service: Ophthalmology;  Laterality: Left;  . COLONOSCOPY     a. 2010  . CORONARY STENT INTERVENTION N/A 04/26/2016   Procedure: Coronary Stent Intervention;  Surgeon: Isaias Cowman, MD;  Location: [facility];  Service: Cardiovascular;  Laterality: N/A;  . LEFT HEART CATH AND CORONARY ANGIOGRAPHY N/A 04/26/2016   Procedure: Left Heart Cath and Coronary Angiography;  Surgeon: Isaias Cowman, MD;  Location: [facility];  Service: Cardiovascular;  Laterality: N/A;  . TONSILLECTOMY       No current facility-administered medications for this encounter.     Allergies:   Fenofibrate; Statins; and Sulfonamide derivatives    Social History:  The patient  reports that she quit smoking about 45 years ago. Her smoking use included Cigarettes. She has a 5.00 pack-year smoking history. She has never used smokeless tobacco. She reports that she does not drink alcohol or use drugs.   Family History:  The patient's family history includes Alcohol abuse in her father;  Alzheimer's disease in her brother; Colon cancer in her sister; Hyperlipidemia in her brother; Hypertension in her father and mother; Kidney disease in her father; Lupus in her mother; Melanoma in her sister.    ROS:  Please see the history of present illness.   Otherwise, review of systems are positive for none.   All other systems are reviewed and negative.      PHYSICAL EXAM: VS:  BP (!) 134/56 (BP Location: Right Arm)   Pulse 76   Temp (!) 96.1 F (35.6 C) (Axillary)   Resp (!) 22   Ht 5\' 1"  (1.549 m)   Wt 165 lb (74.8 kg)   SpO2 100%   BMI 31.18 kg/m  , BMI Body mass index is 31.18 kg/m. GEN: Well nourished, well developed, in no acute distress  HEENT: normal  Neck: Mild JVD, carotid bruits, or masses Cardiac: RRR; no murmurs, rubs, or gallops,no edema  Respiratory:  clear to auscultation bilaterally with mildly diminished breath sounds at the base,  mild respiratory distress which improved with BiPAP GI: soft, nontender, nondistended, + BS MS: no deformity or atrophy  Skin: warm and dry, no rash Neuro:  Strength and sensation are intact Psych: euthymic mood, full affect   EKG:   Personally reviewed by me and showed: Normal sinus rhythm with left bundle branch block with significant anterior ST elevation and reciprocal changes in the inferior leads.   Recent Labs: 10/31/2015: ALT 10; TSH 1.74 04/27/2016: Magnesium 2.0 05/05/2016: BUN 41; Creatinine, Ser 1.20; Hemoglobin 12.2; Platelets 349; Potassium 4.3; Sodium 133    Lipid Panel    Component Value Date/Time   CHOL 218 (H) 04/27/2016 0420   TRIG 141 04/27/2016 0420   HDL 44 04/27/2016 0420   CHOLHDL 5.0 04/27/2016 0420   VLDL 28 04/27/2016 0420   LDLCALC 146 (H) 04/27/2016 0420      Wt Readings from Last 3 Encounters:  05/05/16 165 lb (74.8 kg)  04/26/16 170 lb 3.1 oz (77.2 kg)  04/10/16 164 lb 6.4 oz (74.6 kg)         ASSESSMENT AND PLAN:  1. Unstable angina with suspected stent thrombosis. I proceeded with emergent cardiac catheterization via the right radial artery which showed patent LAD stent with no significant change in coronary anatomy. Continue treatment with aspirin and Brilinta.  2. Acute on chronic systolic heart failure: This is probably the cause of her shortness of breath and hypoxia. Her left ventricular end-diastolic pressure was 22 mmHg. The  patient was already given one dose of furosemide 40 mg intravenously in the ED before cardiac catheterization. I'm going to continue with oral furosemide 40 mg twice daily during her hospitalization which can likely be decreased before hospital discharge to 40 mg daily . She was on 20 mg every other day before presentation. Monitor renal function as her creatinine appears worse. I held lisinopril until we make sure that kidney function is back to baseline.  Please consult [facility] cardiology in the morning.   Signed,  Kathlyn [place], MD  05/05/2016 3:13 AM    [place]

## 2016-05-05 NOTE — ED Triage Notes (Addendum)
Per EMS, pt had sudden onset of SHOB. Pt has recent history of left ascending artery MI (last Thursday per EMS), pt also has hx of CHF and hypertension. Pt was placed on O2 with EMS which pt does not normally wear at home. With EMS pt was 95% on 3L. Pt also reports nausea. Pt appears with labored breathing, EDP in rm. Pt A&O at this time.

## 2016-05-05 NOTE — Progress Notes (Signed)
Pt remains alert and oriented.  Right radial TR band completely deflated.  Gauze and tegaderm in its place.  No signs of bleeding noted.  No hematoma noted.  Pt remains sinus rhythm with a LBBB on the monitor.  Pt has had 1250cc out in urine.

## 2016-05-05 NOTE — Progress Notes (Signed)
Pt taken to cath lab on Bipap. RT stayed with Bipap during procedure. O2 saturation 99% throughout procedure. Pt transported to ICU on Bipap. Tolerated well

## 2016-05-05 NOTE — ED Notes (Signed)
Code STEMI per EDP.

## 2016-05-05 NOTE — ED Notes (Signed)
Dr. Owens Shark notified of critical troponin 3.42. No new orders at this time.

## 2016-05-05 NOTE — ED Notes (Signed)
Pt belongings placed in bag and given to pt's family.

## 2016-05-05 NOTE — ED Notes (Addendum)
Cardiac Cath called ED sec and reports they are ready for pt to come to cath lab, respiratory here to go with pt to cardiac cath lab due to being on bipap.

## 2016-05-05 NOTE — Progress Notes (Signed)
Pt taken off bipap and put on 3L nasal cannula.  Pt is tolerating it well.

## 2016-05-05 NOTE — Consult Note (Signed)
PULMONARY / CRITICAL CARE MEDICINE   Name: Wanda Davenport MRN: [id number] DOB: September 11, 1923    ADMISSION DATE:  05/05/2016   CONSULTATION DATE:05/05/16  REFERRING MD:  Dr. Marcille Blanco  REASON FOR CONSULTATION: ICU management s/p emergent cardiac cath  CHIEF COMPLAINT:  Acute dyspnea at rest  HISTORY OF PRESENT ILLNESS:   83 y/o female with a h/o recent STEMI S/P cardiac cath with LAD stents on 4/6 presenting with acute dyspnea and EKG changes suggestive of stent thrombosis. Emergent cardiac cath unremarkable. PCCM consulted for post-cath management. Reports mild improvement in symptoms. Denies chest pain, nausea, and vomiting.   PAST MEDICAL HISTORY :  She  has a past medical history of Allergic rhinitis; Anxiety; Arthritis; Atypical chest pain; Bronchitis (01/14/2016); Chronic diastolic CHF (congestive heart failure) ([place]); DIVERTICULOSIS, COLON; HEMORRHOIDS, INTERNAL; Hepatic steatosis; Hyperlipidemia; HYPERTENSION; LBBB (left bundle branch block); Osteopenia; and Pre-diabetes.  PAST SURGICAL HISTORY: She  has a past surgical history that includes Breast surgery; Tonsillectomy; Abdominal hysterectomy; Colonoscopy; Cataract extraction w/PHACO (Left, 05/12/2014); Left Heart Cath and Coronary Angiography (N/A, 04/26/2016); and Coronary Stent Intervention (N/A, 04/26/2016).  Allergies  Allergen Reactions  . Fenofibrate Other (See Comments)    Myalgias/gi upset   . Statins Other (See Comments)    Myalgias   . Sulfonamide Derivatives Nausea And Vomiting    REACTION: nausea    No current facility-administered medications on file prior to encounter.    Current Outpatient Prescriptions on File Prior to Encounter  Medication Sig  . albuterol (PROVENTIL HFA;VENTOLIN HFA) 108 (90 BASE) MCG/ACT inhaler Inhale 2 puffs into the lungs every 6 (six) hours as needed. Wheezing or shortness of breath  . ALPRAZolam (XANAX) 0.25 MG tablet Take 1 tablet (0.25 mg total) by mouth 2 (two) times daily as needed  for anxiety.  Marland Kitchen aspirin 325 MG EC tablet Take 325 mg by mouth daily.  . bisoprolol (ZEBETA) 10 MG tablet Take 1 tablet (10 mg total) by mouth daily.  Marland Kitchen escitalopram (LEXAPRO) 10 MG tablet TAKE 1 TABLET (10 MG TOTAL) BY MOUTH DAILY.  . fluticasone (FLONASE) 50 MCG/ACT nasal spray Place 2 sprays into the nose daily as needed. Nasal congestion Rarely used  . furosemide (LASIX) 20 MG tablet Take 1 tablet (20 mg total) by mouth every other day.  . isosorbide mononitrate (IMDUR) 30 MG 24 hr tablet Take 1 tablet (30 mg total) by mouth daily.  Marland Kitchen lisinopril (PRINIVIL,ZESTRIL) 20 MG tablet Take 1 tablet by mouth daily.  . montelukast (SINGULAIR) 10 MG tablet Take 1 tablet (10 mg total) by mouth at bedtime. For wheeze/SOB  . nitroGLYCERIN (NITROSTAT) 0.3 MG SL tablet Place 0.3 mg under the tongue every 5 (five) minutes as needed for chest pain.  . ticagrelor (BRILINTA) 90 MG TABS tablet Take 1 tablet (90 mg total) by mouth 2 (two) times daily.  . Calcium Carbonate-Vitamin D 600-400 MG-UNIT tablet Take 2 tablets by mouth daily. (Patient not taking: Reported on 04/26/2016)    FAMILY HISTORY:  Her indicated that the status of her mother is unknown. She indicated that the status of her father is unknown.    SOCIAL HISTORY: She  reports that she quit smoking about 45 years ago. Her smoking use included Cigarettes. She has a 5.00 pack-year smoking history. She has never used smokeless tobacco. She reports that she does not drink alcohol or use drugs.  REVIEW OF SYSTEMS:   All systems reviewed. Pertinent positive include dyspnea at rest and with exertion, and generalized weakness. All other systems  are negative  SUBJECTIVE:   VITAL SIGNS: BP 113/68   Pulse 73   Temp 97.4 F (36.3 C) (Axillary)   Resp (!) 25   Ht 5\' 4"  (1.626 m)   Wt 167 lb 5.3 oz (75.9 kg)   SpO2 94%   BMI 28.72 kg/m   HEMODYNAMICS:    VENTILATOR SETTINGS: FiO2 (%):  [50 %] 50 %  INTAKE / OUTPUT: I/O last 3 completed  shifts: In: 113.3 [I.V.:113.3] Out: 1250 [Urine:1250]  PHYSICAL EXAMINATION: General:  NAD Neuro:  AAO X3, no deficits HEENT:  PERRLA Cardiovascular:  RRR, S1/S2, no MRG Lungs:  CTAB Abdomen:  NT/ND, + BS X4 Musculoskeletal:  No deformities Skin: warm and dry  LABS:  BMET  Recent Labs Lab 05/05/16 0135 05/05/16 0527  NA 133* 135  K 4.3 4.3  CL 104 106  CO2 20* 23  BUN 41* 40*  CREATININE 1.20* 1.16*  GLUCOSE 166* 122*    Electrolytes  Recent Labs Lab 05/05/16 0135 05/05/16 0527  CALCIUM 9.9 9.7  MG  --  2.2  PHOS  --  3.7    CBC  Recent Labs Lab 05/05/16 0135 05/05/16 0527  WBC 14.2* 10.6  HGB 12.2 11.7*  HCT 37.9 34.9*  PLT 349 270    Coag's No results for input(s): APTT, INR in the last 168 hours.  Sepsis Markers No results for input(s): LATICACIDVEN, PROCALCITON, O2SATVEN in the last 168 hours.  ABG  Recent Labs Lab 05/05/16 0141  PHART 7.37  PCO2ART 36  PO2ART 117*    Liver Enzymes No results for input(s): AST, ALT, ALKPHOS, BILITOT, ALBUMIN in the last 168 hours.  Cardiac Enzymes  Recent Labs Lab 05/05/16 0135  TROPONINI 3.42*    Glucose  Recent Labs Lab 05/05/16 0318  GLUCAP 139*    Imaging Dg Chest Port 1 View  Result Date: 05/05/2016 CLINICAL DATA:  Acute onset shortness of breath. History of hypertension, CHF. EXAM: PORTABLE CHEST 1 VIEW COMPARISON:  Chest radiograph March 29, 2016 FINDINGS: The cardiac silhouette is mildly enlarged and unchanged. Mild chronic interstitial changes without focal consolidation. Trace LEFT pleural effusion. No pneumothorax. Osteopenia. Soft tissue planes are nonsuspicious. Mildly calcified aortic knob. IMPRESSION: Stable examination: Mild cardiomegaly, chronic interstitial changes with trace LEFT pleural effusion. Electronically Signed   By: Elon Alas M.D.   On: 05/05/2016 02:07     STUDIES:  Cardiac cath  LINES/TUBES: PIVs  DISCUSSION: 83 y/o female presenting with  acute dyspnea.ACS ruled out. Symptoms likely due to acute CHF exacerbation.   ASSESSMENT   Acute dyspnea>Stent thrombosis ruled out>Possibly due to acute CHF exacerbation Unstable angina CAD s/p LAD stent Mild AKI LBBB Hypertension Hyperlipidemia  Plan Hemodynamically stable Cardiology following; all treatment per cards  FAMILY  - Updates: no family at bedside    Magdalene S. [facility] ANP-BC Pulmonary and [place] Pager [phone] or [phone] 05/05/2016, 7:15 AM   PCCM ATTENDING ATTESTATION:  I have evaluated patient with the APP [place], reviewed database in its entirety and discussed care plan in detail. In addition, this patient was discussed on multidisciplinary rounds.   Important exam findings: NAD on [place] O2 HEENT WNL JVP not well visualized Bibasilar crackles NABS, NT Extremities warm  CXR: Vascular congestion and interstitial edema consistent with CHF  Major problems addressed by PCCM team: Acute hypoxemic respiratory failure Mild pulmonary edema Recent myocardial infarction Left ventricular dysfunction (LVEF 35-40%)   PLAN/REC: Discussed with Dr. Benjie Karvonen Transfer to telemetry Management of CHF  per cardiology After transfer, PCCM will sign off. Please call if we can be of further assistance   Merton Border, MD PCCM service Mobile [phone] Pager [phone]  05/05/2016 3:47 PM

## 2016-05-05 NOTE — Progress Notes (Signed)
Events of the past 24 hours noted. Cardiac cath per Dr. Fletcher Anon noted. Patent stent in lad. Pt presented with sob. CXR revealed chronic interstitial changes with trace left pleural effusion. No pulmonary edema and no change from recent cxr. Troponin 3.42 which appears to be trending down from value due to recent stemi. Continue dual antiplatelet therapy with asa and ticagrelor, bisoprolol, po lasix, long acting nitrates at 30 mg daily. OK to transfer to floor. Will follow over night. SOB may be secondary to ticagrelor which may require changing to clopidigrel however, will stay with ticagrelor for now.

## 2016-05-06 ENCOUNTER — Inpatient Hospital Stay: Payer: Medicare HMO

## 2016-05-06 LAB — CBC
HCT: 33.3 % — ABNORMAL LOW (ref 35.0–47.0)
Hemoglobin: 11.1 g/dL — ABNORMAL LOW (ref 12.0–16.0)
MCH: 28.8 pg (ref 26.0–34.0)
MCHC: 33.4 g/dL (ref 32.0–36.0)
MCV: 86.3 fL (ref 80.0–100.0)
Platelets: 256 10*3/uL (ref 150–440)
RBC: 3.86 MIL/uL (ref 3.80–5.20)
RDW: 15.1 % — ABNORMAL HIGH (ref 11.5–14.5)
WBC: 8.1 10*3/uL (ref 3.6–11.0)

## 2016-05-06 LAB — BASIC METABOLIC PANEL
ANION GAP: 7 (ref 5–15)
BUN: 35 mg/dL — AB (ref 6–20)
CHLORIDE: 103 mmol/L (ref 101–111)
CO2: 25 mmol/L (ref 22–32)
CREATININE: 1.04 mg/dL — AB (ref 0.44–1.00)
Calcium: 9.5 mg/dL (ref 8.9–10.3)
GFR calc Af Amer: 53 mL/min — ABNORMAL LOW (ref >60)
GFR calc non Af Amer: 46 mL/min — ABNORMAL LOW (ref >60)
GLUCOSE: 101 mg/dL — AB (ref 65–99)
POTASSIUM: 3.9 mmol/L (ref 3.5–5.1)
SODIUM: 135 mmol/L (ref 135–145)

## 2016-05-06 LAB — BRAIN NATRIURETIC PEPTIDE: B Natriuretic Peptide: 1354 pg/mL — ABNORMAL HIGH (ref 0.0–100.0)

## 2016-05-06 LAB — TROPONIN I: Troponin I: 2.44 ng/mL (ref <0.03)

## 2016-05-06 MED ORDER — FUROSEMIDE 20 MG PO TABS: 20.0000 mg | ORAL_TABLET | Freq: Every day | ORAL | 0 refills | Status: DC

## 2016-05-06 MED ORDER — LEVOFLOXACIN 750 MG PO TABS: 750.0000 mg | ORAL_TABLET | ORAL | 0 refills | Status: AC

## 2016-05-06 MED ORDER — LEVOFLOXACIN 750 MG PO TABS
750.0000 mg | ORAL_TABLET | ORAL | Status: DC
  Administered 2016-05-06: 750 mg via ORAL
  Filled 2016-05-06: qty 1

## 2016-05-06 MED ORDER — LISINOPRIL 20 MG PO TABS
20.0000 mg | ORAL_TABLET | Freq: Every day | ORAL | Status: DC
Start: 2016-05-06 — End: 2016-05-06
  Administered 2016-05-06: 20 mg via ORAL
  Filled 2016-05-06: qty 1

## 2016-05-06 NOTE — Discharge Summary (Signed)
[facility] [place] NAME: Wanda Davenport    MR#:  [id number]  DATE OF BIRTH:  January 08, 1923  DATE OF ADMISSION:  05/05/2016 ADMITTING PHYSICIAN: Wellington Hampshire, MD  DATE OF DISCHARGE: 05/06/2016  PRIMARY CARE PHYSICIAN: Mellody Dance, DO    ADMISSION DIAGNOSIS:  ST elevation myocardial infarction involving left anterior descending (LAD) coronary artery (HCC) [I21.02] Acute on chronic systolic (congestive) heart failure (HCC) [I50.23]  DISCHARGE DIAGNOSIS:  Active Problems:   Unstable angina (HCC)   Acute on chronic systolic (congestive) heart failure (HCC) Pneumonia  SECONDARY DIAGNOSIS:   Past Medical History:  Diagnosis Date  . Allergic rhinitis   . Anxiety   . Arthritis   . Atypical chest pain   . Bronchitis 01/14/2016  . Chronic diastolic CHF (congestive heart failure) (HCC)    Echo 06/2010 EF 60-65%, no RWMAs, grade 1 diastolic dysfunction, mild LAE, PASP 15mmHg.  Marland Kitchen DIVERTICULOSIS, COLON   . HEMORRHOIDS, INTERNAL   . Hepatic steatosis   . Hyperlipidemia   . HYPERTENSION   . LBBB (left bundle branch block)    a. present on ECG 06/2010  . Osteopenia   . Pre-diabetes    A1c 6.0    HOSPITAL COURSE:  83 year old female with recent STEMI status post PCI to LAD who presented with shortness of breath and hypoxia.   1. Unstable angina with suspect stent thrombosis: Patient underwent cardiac catheterization which showed patent LAD and no significant change in coronary anatomy. Patient will continue aspirin, Brillanta, Zabeta, Imdur  2. Acute on chronic systolic heart failure: Patient was diuresed with Lasix and repeat chest x-ray today shows resolved pulmonary edema. Patient will continue Lasix and lisinopril. Kidney function has improved   3. PNA with most recent diagnosis of respiratory failure since last admission on 2 L oxygen: Will patient was started Levaquin based on CXR and will receive treatment for 5  days. Continue oxygen.  4. Depression: Continue Lexapro   DISCHARGE CONDITIONS AND DIET:   Stable for discharge on heart healthy diet  CONSULTS OBTAINED:  Treatment Team:  Teodoro Spray, MD  DRUG ALLERGIES:   Allergies  Allergen Reactions  . Fenofibrate Other (See Comments)    Myalgias/gi upset   . Statins Other (See Comments)    Myalgias   . Sulfonamide Derivatives Nausea And Vomiting    REACTION: nausea    DISCHARGE MEDICATIONS:   Current Discharge Medication List    START taking these medications   Details  levofloxacin (LEVAQUIN) 750 MG tablet Take 1 tablet (750 mg total) by mouth every other day. Qty: 2 tablet, Refills: 0      CONTINUE these medications which have CHANGED   Details  furosemide (LASIX) 20 MG tablet Take 1 tablet (20 mg total) by mouth daily. Qty: 30 tablet, Refills: 0      CONTINUE these medications which have NOT CHANGED   Details  albuterol (PROVENTIL HFA;VENTOLIN HFA) 108 (90 BASE) MCG/ACT inhaler Inhale 2 puffs into the lungs every 6 (six) hours as needed. Wheezing or shortness of breath    ALPRAZolam (XANAX) 0.25 MG tablet Take 1 tablet (0.25 mg total) by mouth 2 (two) times daily as needed for anxiety. Qty: 30 tablet, Refills: 0    aspirin 325 MG EC tablet Take 325 mg by mouth daily.    bisoprolol (ZEBETA) 10 MG tablet Take 1 tablet (10 mg total) by mouth daily. Qty: 90 tablet, Refills: 0    escitalopram (LEXAPRO) 10 MG tablet  TAKE 1 TABLET (10 MG TOTAL) BY MOUTH DAILY. Qty: 90 tablet, Refills: 1   Associated Diagnoses: Anxiety    fluticasone (FLONASE) 50 MCG/ACT nasal spray Place 2 sprays into the nose daily as needed. Nasal congestion Rarely used    isosorbide mononitrate (IMDUR) 30 MG 24 hr tablet Take 1 tablet (30 mg total) by mouth daily. Qty: 30 tablet, Refills: 1    lisinopril (PRINIVIL,ZESTRIL) 20 MG tablet Take 1 tablet by mouth daily.    montelukast (SINGULAIR) 10 MG tablet Take 1 tablet (10 mg total) by mouth  at bedtime. For wheeze/SOB Qty: 90 tablet, Refills: 0    nitroGLYCERIN (NITROSTAT) 0.3 MG SL tablet Place 0.3 mg under the tongue every 5 (five) minutes as needed for chest pain.    ticagrelor (BRILINTA) 90 MG TABS tablet Take 1 tablet (90 mg total) by mouth 2 (two) times daily. Qty: 60 tablet, Refills: 1    Calcium Carbonate-Vitamin D 600-400 MG-UNIT tablet Take 2 tablets by mouth daily. Qty: 60 tablet, Refills: 11          Today   CHIEF COMPLAINT:  Patient is doing well this point. She would like to leave hospital for possible today. She denies shortness of breath or chest pain.   VITAL SIGNS:  Blood pressure (!) 123/57, pulse 75, temperature 97.5 F (36.4 C), temperature source Oral, resp. rate 20, height 5\' 4"  (1.626 m), weight 75.9 kg (167 lb 5.3 oz), SpO2 96 %.   REVIEW OF SYSTEMS:  Review of Systems  Constitutional: Negative.  Negative for chills, fever and malaise/fatigue.  HENT: Negative.  Negative for ear discharge, ear pain, hearing loss, nosebleeds and sore throat.   Eyes: Negative.  Negative for blurred vision and pain.  Respiratory: Negative.  Negative for cough, hemoptysis, shortness of breath and wheezing.   Cardiovascular: Negative.  Negative for chest pain, palpitations and leg swelling.  Gastrointestinal: Negative.  Negative for abdominal pain, blood in stool, diarrhea, nausea and vomiting.  Genitourinary: Negative.  Negative for dysuria.  Musculoskeletal: Negative.  Negative for back pain.  Skin: Negative.   Neurological: Negative for dizziness, tremors, speech change, focal weakness, seizures and headaches.  Endo/Heme/Allergies: Negative.  Does not bruise/bleed easily.  Psychiatric/Behavioral: Negative.  Negative for depression, hallucinations and suicidal ideas.     PHYSICAL EXAMINATION:  GENERAL:  83 y.o.-year-old patient lying in the bed with no acute distress.  NECK:  Supple, no jugular venous distention. No thyroid enlargement, no tenderness.   LUNGS: Normal breath sounds bilaterally, no wheezing, rales,rhonchi  No use of accessory muscles of respiration.  CARDIOVASCULAR: S1, S2 normal. NO urmurs, rubs, or gallops.  ABDOMEN: Soft, non-tender, non-distended. Bowel sounds present. No organomegaly or mass.  EXTREMITIES: No pedal edema, cyanosis, or clubbing.  PSYCHIATRIC: The patient is alert and oriented x 3.  SKIN: No obvious rash, lesion, or ulcer.   DATA REVIEW:   CBC  Recent Labs Lab 05/06/16 0508  WBC 8.1  HGB 11.1*  HCT 33.3*  PLT 256    Chemistries   Recent Labs Lab 05/05/16 0527 05/06/16 0508  NA 135 135  K 4.3 3.9  CL 106 103  CO2 23 25  GLUCOSE 122* 101*  BUN 40* 35*  CREATININE 1.16* 1.04*  CALCIUM 9.7 9.5  MG 2.2  --     Cardiac Enzymes  Recent Labs Lab 05/05/16 0135 05/06/16 0508  TROPONINI 3.42* 2.44*    Microbiology Results  @MICRORSLT48 @  RADIOLOGY:  Dg Chest Port 1 View  Result Date: 05/06/2016  CLINICAL DATA:  83 year old female with un stable angina. Shortness of breath and chest pain. Respiratory failure. EXAM: PORTABLE CHEST 1 VIEW COMPARISON:  05/05/2016 and earlier. FINDINGS: Portable AP upright view at 0519 hours. Stable lung volumes. Stable mediastinal contours with borderline to mild cardiomegaly. Visualized tracheal air column is within normal limits. No pneumothorax. Mildly decreased pulmonary vascularity since yesterday with no overt edema. Continued dense retrocardiac opacity. No large pleural effusion. IMPRESSION: 1. Decreased pulmonary vascularity since yesterday.  No acute edema. 2. Bilateral lower lobe collapse or consolidation. Electronically Signed   By: Genevie Ann M.D.   On: 05/06/2016 07:13   Dg Chest Port 1 View  Result Date: 05/05/2016 CLINICAL DATA:  Acute onset shortness of breath. History of hypertension, CHF. EXAM: PORTABLE CHEST 1 VIEW COMPARISON:  Chest radiograph March 29, 2016 FINDINGS: The cardiac silhouette is mildly enlarged and unchanged. Mild chronic  interstitial changes without focal consolidation. Trace LEFT pleural effusion. No pneumothorax. Osteopenia. Soft tissue planes are nonsuspicious. Mildly calcified aortic knob. IMPRESSION: Stable examination: Mild cardiomegaly, chronic interstitial changes with trace LEFT pleural effusion. Electronically Signed   By: Elon Alas M.D.   On: 05/05/2016 02:07      Current Discharge Medication List    START taking these medications   Details  levofloxacin (LEVAQUIN) 750 MG tablet Take 1 tablet (750 mg total) by mouth every other day. Qty: 2 tablet, Refills: 0      CONTINUE these medications which have CHANGED   Details  furosemide (LASIX) 20 MG tablet Take 1 tablet (20 mg total) by mouth daily. Qty: 30 tablet, Refills: 0      CONTINUE these medications which have NOT CHANGED   Details  albuterol (PROVENTIL HFA;VENTOLIN HFA) 108 (90 BASE) MCG/ACT inhaler Inhale 2 puffs into the lungs every 6 (six) hours as needed. Wheezing or shortness of breath    ALPRAZolam (XANAX) 0.25 MG tablet Take 1 tablet (0.25 mg total) by mouth 2 (two) times daily as needed for anxiety. Qty: 30 tablet, Refills: 0    aspirin 325 MG EC tablet Take 325 mg by mouth daily.    bisoprolol (ZEBETA) 10 MG tablet Take 1 tablet (10 mg total) by mouth daily. Qty: 90 tablet, Refills: 0    escitalopram (LEXAPRO) 10 MG tablet TAKE 1 TABLET (10 MG TOTAL) BY MOUTH DAILY. Qty: 90 tablet, Refills: 1   Associated Diagnoses: Anxiety    fluticasone (FLONASE) 50 MCG/ACT nasal spray Place 2 sprays into the nose daily as needed. Nasal congestion Rarely used    isosorbide mononitrate (IMDUR) 30 MG 24 hr tablet Take 1 tablet (30 mg total) by mouth daily. Qty: 30 tablet, Refills: 1    lisinopril (PRINIVIL,ZESTRIL) 20 MG tablet Take 1 tablet by mouth daily.    montelukast (SINGULAIR) 10 MG tablet Take 1 tablet (10 mg total) by mouth at bedtime. For wheeze/SOB Qty: 90 tablet, Refills: 0    nitroGLYCERIN (NITROSTAT) 0.3 MG SL  tablet Place 0.3 mg under the tongue every 5 (five) minutes as needed for chest pain.    ticagrelor (BRILINTA) 90 MG TABS tablet Take 1 tablet (90 mg total) by mouth 2 (two) times daily. Qty: 60 tablet, Refills: 1    Calcium Carbonate-Vitamin D 600-400 MG-UNIT tablet Take 2 tablets by mouth daily. Qty: 60 tablet, Refills: 11            Management plans discussed with the patient and she is in agreement. Stable for discharge home  Patient should follow up with dr  fath  CODE STATUS:     Code Status Orders        Start     Ordered   05/05/16 0332  Full code  Continuous     05/05/16 0331    Code Status History    Date Active Date Inactive Code Status Order ID Comments User Context   04/26/2016  5:12 PM 05/01/2016  3:49 AM Full Code [id number]  Nicholes Mango, MD Inpatient   04/26/2016  2:08 PM 04/26/2016  5:12 PM Full Code [id number]  Isaias Cowman, MD Inpatient   09/23/2011  2:11 PM 09/26/2011  4:43 PM Full Code [id number]  Willette Pa, RN Inpatient    Advance Directive Documentation     Most Recent Value  Type of Advance Directive  Healthcare Power of [place], Living will  Pre-existing out of facility DNR order (yellow form or pink MOST form)  -  "MOST" Form in Place?  -      TOTAL TIME TAKING CARE OF THIS PATIENT: 37 minutes.    Note: This dictation was prepared with Dragon dictation along with smaller phrase technology. Any transcriptional errors that result from this process are unintentional.  Kongmeng Santoro M.D on 05/06/2016 at 10:16 AM  Between 7am to 6pm - Pager - [phone] After 6pm go to [email or web address] - password [email or web address] [facility]  Office  [phone]  CC: Primary care physician; Mellody Dance, DO

## 2016-05-06 NOTE — Progress Notes (Signed)
Discharge paperwork reviewed with patient and family members. Information regarding follow-up appointments, medications and education for all newly prescribed meds was provided, all questions answered. Peripheral IV removed, catheter intact. Heart monitor removed, returned to the nurse station. Transport requested via wheelchair to the lobby for discharge. Family will be transporting patient back to [facility].

## 2016-05-06 NOTE — Progress Notes (Deleted)
Impression and Recommendations:    No diagnosis found.   No problem-specific Assessment & Plan notes found for this encounter.   The patient was counseled, risk factors were discussed, anticipatory guidance given.   New Prescriptions   No medications on file     No orders of the defined types were placed in this encounter.    Discontinued Medications   No medications on file     No orders of the defined types were placed in this encounter.    Gross side effects, risk and benefits, and alternatives of medications and treatment plan in general discussed with patient.  Patient is aware that all medications have potential side effects and we are unable to predict every side effect or drug-drug interaction that may occur.   Patient will call with any questions prior to using medication if they have concerns.  Expresses verbal understanding and consents to current therapy and treatment regimen.  No barriers to understanding were identified.  Red flag symptoms and signs discussed in detail.  Patient expressed understanding regarding what to do in case of emergency\urgent symptoms  Please see AVS handed out to patient at the end of our visit for further patient instructions/ counseling done pertaining to today's office visit.   No Follow-up on file.     Note: This document was prepared using Dragon voice recognition software and may include unintentional dictation errors.   --------------------------------------------------------------------------------------------------------------------------------------------------------------------------------------------------------------------------------------------    Subjective:    CC: No chief complaint on file.   HPI: Wanda Davenport is a 83 y.o. female who presents to [place] at [facility] today for hospital follow-up   Recent med records from [place] reviewed by myself. See below.    Pt had a recent  STEMI status post PCI to LAD on 04/06/16 who presented to the ED on 05/05/16 with shortness of breath, hypoxia and EKG changes thought to be a rethrombosis of her LAD stent.       Repeat cath 05/05/16 showed patent vasculature/ LAD w/o change and found pt to be in acute on chronic CHF and was diuresed on lasix.  D/ced on aspirin, Brillanta, Zabeta, Imdur, lasix and lisinipril as renal fxn/ crt improved prior to d/c.        -  Also txed for presumed PNA with 5 d levaquin based on CXR and resp state.  D/ced on 2 L O2.    Remote Hx:   Pt with known history of hypertension, hyperlipidemia and recently diagnosed coronary artery disease. She presented on April 26 with anterior ST elevation myocardial infarction with left bundle branch block. Emergent cardiac catheterization was done by Dr. Saralyn Pilar which showed ostial LAD occlusion.  This was treated successfully with PCI and drug-eluting stent placement.  Ejection fraction was 35-40% with mid to distal anterior wall akinesis.   The patient was discharged to a skilled nursing facility and was doing reasonably well up until 5/5 when she had sudden onset of shortness of breath and respiratory distress.  She was brought to the emergency room and was started on BiPAP.   She was given one dose of IV furosemide 40 mg.   EKG showed left bundle branch block with ST changes again suggestive of possible recurrent anterior ST elevation.   This was compared with EKG before hospital discharge and appeared to be significantly different.  The patient denied chest pain but she had orthopnea.        Wt Readings from Last 3  Encounters:  05/05/16 167 lb 5.3 oz (75.9 kg)  04/26/16 170 lb 3.1 oz (77.2 kg)  04/10/16 164 lb 6.4 oz (74.6 kg)   BP Readings from Last 3 Encounters:  05/06/16 (!) 123/57  04/30/16 (!) 117/49  04/10/16 (!) 147/70   Pulse Readings from Last 3 Encounters:  05/06/16 75  04/30/16 80  04/10/16 61   BMI Readings from Last 3 Encounters:  05/05/16 28.72  kg/m  04/26/16 31.13 kg/m  04/10/16 28.22 kg/m     Patient Care Team    Relationship Specialty Notifications Start End  Mellody Dance, DO PCP - General Family Medicine  10/24/15   Christene Slates, MD  Dermatology  10/24/15   Leandrew Koyanagi, MD Referring Physician Ophthalmology  11/06/15   Deboraha Sprang, MD Consulting Physician Cardiology  11/06/15   Gatha Mayer, MD Consulting Physician Gastroenterology  11/06/15    Comment: she used to see Delfin Edis, MD     Patient Active Problem List   Diagnosis Date Noted  . Medication monitoring encounter 01/26/2016    Priority: High  . Intolerance of drug- statins 01/25/2016    Priority: High  . Hypertriglyceridemia 12/13/2015    Priority: High  . Low serum HDL 12/13/2015    Priority: High  . Adjustment disorder with mixed anxiety and depressed mood 11/06/2015    Priority: High  . Chronic diastolic heart failure ([place]) 04/16/2011    Priority: High  . Hyperlipidemia     Priority: High  . Pre-diabetes     Priority: High  . Hypertension with CHF ( diastolic dysf grade I)  42/35/3614    Priority: High  . Claudication of left lower extremity ([place]) 12/13/2015    Priority: Medium  . Basal cell carcinoma 10/31/2015    Priority: Medium  . GAD (generalized anxiety disorder) 10/13/2015    Priority: Medium  . chronic LBBB (left bundle branch block)- since 2012     Priority: Medium  . B12 deficiency 12/13/2015    Priority: Low  . Vitamin D deficiency 11/06/2015    Priority: Low  . Environmental and seasonal allergies 10/31/2015    Priority: Low  . Reactive airway disease- only spring and fall due to allergies 10/31/2015    Priority: Low  . Generalized OA     Priority: Low  . Allergic rhinitis     Priority: Low  . Acute on chronic systolic (congestive) heart failure ([place]) 05/05/2016  . Unstable angina ([place])   . Acute ST elevation myocardial infarction (STEMI) involving left anterior descending (LAD) coronary artery  ([place]) 04/26/2016  . STEMI (ST elevation myocardial infarction) ([place]) 04/26/2016  . Knee pain, bilateral 04/10/2016  . Counseling regarding end of life decision making 01/25/2016  . Atypical chest pain 04/16/2011  . DIVERTICULOSIS, COLON 12/03/2007    Past Medical history, Surgical history, Family history, Social history, Allergies and Medications have been entered into the medical record, reviewed and changed as needed.    No outpatient prescriptions have been marked as taking for the 05/07/16 encounter (Appointment) with Mellody Dance, DO.    Allergies:  Allergies  Allergen Reactions  . Fenofibrate Other (See Comments)    Myalgias/gi upset   . Statins Other (See Comments)    Myalgias   . Sulfonamide Derivatives Nausea And Vomiting    REACTION: nausea     Review of Systems: General:   Denies fever, chills, unexplained weight loss.  Optho/Auditory:   Denies visual changes, blurred vision/LOV Respiratory:   Denies wheeze, DOE more  than baseline levels.  Cardiovascular:   Denies chest pain, palpitations, new onset peripheral edema  Gastrointestinal:   Denies nausea, vomiting, diarrhea, abd pain.  Genitourinary: Denies dysuria, freq/ urgency, flank pain or discharge from genitals.  Endocrine:     Denies hot or cold intolerance, polyuria, polydipsia. Musculoskeletal:   Denies unexplained myalgias, joint swelling, unexplained arthralgias, gait problems.  Skin:  Denies new onset rash, suspicious lesions Neurological:     Denies dizziness, unexplained weakness, numbness  Psychiatric/Behavioral:   Denies mood changes, suicidal or homicidal ideations, hallucinations       Objective:   There were no vitals taken for this visit. There is no height or weight on file to calculate BMI. General:  Well Developed, well nourished, appropriate for stated age.  Neuro:  Alert and oriented,  extra-ocular muscles intact  HEENT:  Normocephalic, atraumatic, neck supple, no carotid bruits  appreciated  Skin:  no gross rash, warm, pink. Cardiac:  RRR, S1 S2 Respiratory:  ECTA B/L and A/P, Not using accessory muscles, speaking in full sentences- unlabored. Vascular:  Ext warm, no cyanosis apprec.; cap RF less 2 sec. Psych:  No HI/SI, judgement and insight good, Euthymic mood. Full Affect.

## 2016-05-06 NOTE — Progress Notes (Signed)
Pharmacy Antibiotic Note  Wanda Davenport is a 83 y.o. female admitted on 05/05/2016 with pneumonia.  Pharmacy has been consulted for levofloxacin dosing.  Plan: Levofloxacin 750 mg PO q48h  Height: 5\' 4"  (162.6 cm) Weight: 167 lb 5.3 oz (75.9 kg) IBW/kg (Calculated) : 54.7  Temp (24hrs), Avg:97.9 F (36.6 C), Min:97.5 F (36.4 C), Max:98.1 F (36.7 C)   Recent Labs Lab 05/05/16 0135 05/05/16 0527 05/06/16 0508  WBC 14.2* 10.6 8.1  CREATININE 1.20* 1.16* 1.04*    Estimated Creatinine Clearance: 35.2 mL/min (A) (by C-G formula based on SCr of 1.04 mg/dL (H)).    Allergies  Allergen Reactions  . Fenofibrate Other (See Comments)    Myalgias/gi upset   . Statins Other (See Comments)    Myalgias   . Sulfonamide Derivatives Nausea And Vomiting    REACTION: nausea    Thank you for allowing pharmacy to be a part of this patient's care.  Lenis Noon, PharmD 05/06/2016 9:15 AM

## 2016-05-06 NOTE — Clinical Social Work Note (Signed)
Patient will discharge to [facility] via family transport. The family and facility are aware. Documentation sent to [facility]. Packet to be delivered to chart. CSW will continue to follow pending additional discharge needs.  Santiago Bumpers, MSW, Latanya Presser [phone]

## 2016-05-06 NOTE — Clinical Social Work Note (Signed)
CSW received a verbal notification from the attending MD that this patient was admitted from [facility] having recently been discharged there for STR. CSW confirmed with [facility], and the patient can return once stable, as early as today. CSW will continue to follow for discharge planning. Currently, the patient is waiting for clearance from cardiology.  -Santiago Bumpers, MSW, LCSWA [phone]

## 2016-05-06 NOTE — Progress Notes (Signed)
Report called to Clarene Critchley, Therapist, sports at [facility]. All questions answered.

## 2016-05-06 NOTE — NC FL2 (Signed)
[place] LEVEL OF CARE SCREENING TOOL     IDENTIFICATION  Patient Name: Wanda Davenport Birthdate: 09/30/1923 Sex: female Admission Date (Current Location): 05/05/2016  [place] and [place] Number:  Engineering geologist and Address:  [facility], [address], [place], [place] [address]      Provider Number: [id number]  Attending Physician Name and Address:  Bettey Costa, MD  Relative Name and Phone Number:       Current Level of Care: Hospital Recommended Level of Care: [place] Prior Approval Number:    Date Approved/Denied:   PASRR Number: [id number]  Discharge Plan: SNF    Current Diagnoses: Patient Active Problem List   Diagnosis Date Noted  . Acute on chronic systolic (congestive) heart failure ([place]) 05/05/2016  . Unstable angina ([place])   . Acute ST elevation myocardial infarction (STEMI) involving left anterior descending (LAD) coronary artery ([place]) 04/26/2016  . STEMI (ST elevation myocardial infarction) ([place]) 04/26/2016  . Knee pain, bilateral 04/10/2016  . Medication monitoring encounter 01/26/2016  . Intolerance of drug- statins 01/25/2016  . Counseling regarding end of life decision making 01/25/2016  . Hypertriglyceridemia 12/13/2015  . Low serum HDL 12/13/2015  . B12 deficiency 12/13/2015  . Claudication of left lower extremity ([place]) 12/13/2015  . Vitamin D deficiency 11/06/2015  . Adjustment disorder with mixed anxiety and depressed mood 11/06/2015  . Basal cell carcinoma 10/31/2015  . Environmental and seasonal allergies 10/31/2015  . Reactive airway disease- only spring and fall due to allergies 10/31/2015  . GAD (generalized anxiety disorder) 10/13/2015  . Atypical chest pain 04/16/2011  . Chronic diastolic heart failure ([place]) 04/16/2011  . Hyperlipidemia   . Pre-diabetes   . chronic LBBB (left bundle branch block)- since 2012   . Generalized OA   . Allergic rhinitis   . DIVERTICULOSIS,  COLON 12/03/2007  . Hypertension with CHF ( diastolic dysf grade I)  64/33/2951    Orientation RESPIRATION BLADDER Height & Weight     Self, Time, Situation, Place  O2 (2L o2) Continent Weight: 167 lb 5.3 oz (75.9 kg) Height:  5\' 4"  (162.6 cm)  BEHAVIORAL SYMPTOMS/MOOD NEUROLOGICAL BOWEL NUTRITION STATUS      Continent Diet (Heart Healthy)  AMBULATORY STATUS COMMUNICATION OF NEEDS Skin   Extensive Assist Verbally Normal                       Personal Care Assistance Level of Assistance  Bathing, Feeding, Dressing Bathing Assistance: Limited assistance Feeding assistance: Independent Dressing Assistance: Limited assistance     Functional Limitations Info             SPECIAL CARE FACTORS FREQUENCY  PT (By licensed PT), OT (By licensed OT)     PT Frequency: Restart original PT orders OT Frequency: Restart original OT orders            Contractures Contractures Info: Present    Additional Factors Info  Allergies   Allergies Info:  Fenofibrate, Statins, Sulfonamide Derivatives           Current Medications (05/06/2016):  This is the current hospital active medication list Current Facility-Administered Medications  Medication Dose Route Frequency Provider Last Rate Last Dose  . acetaminophen (TYLENOL) tablet 650 mg  650 mg Oral Q4H PRN Kathlyn Sacramento A, MD      . albuterol (PROVENTIL) (2.5 MG/3ML) 0.083% nebulizer solution 3 mL  3 mL Inhalation Q6H PRN Wellington Hampshire, MD      .  ALPRAZolam Duanne Moron) tablet 0.25 mg  0.25 mg Oral BID PRN Wellington Hampshire, MD      . aspirin chewable tablet 81 mg  81 mg Oral Daily Kathlyn Sacramento A, MD   81 mg at 05/05/16 0946  . bisoprolol (ZEBETA) tablet 10 mg  10 mg Oral Daily Wellington Hampshire, MD   10 mg at 05/05/16 0946  . calcium-vitamin D (OSCAL WITH D) 500-200 MG-UNIT per tablet 2 tablet  2 tablet Oral Daily Wellington Hampshire, MD   2 tablet at 05/05/16 0945  . enoxaparin (LOVENOX) injection 30 mg  30 mg Subcutaneous Q24H  Arida, Muhammad A, MD      . escitalopram (LEXAPRO) tablet 10 mg  10 mg Oral Daily Kathlyn Sacramento A, MD   10 mg at 05/05/16 0945  . fluticasone (FLONASE) 50 MCG/ACT nasal spray 2 spray  2 spray Each Nare Daily PRN Kathlyn Sacramento A, MD      . furosemide (LASIX) tablet 40 mg  40 mg Oral Daily Wilhelmina Mcardle, MD      . isosorbide mononitrate (IMDUR) 24 hr tablet 30 mg  30 mg Oral Daily Kathlyn Sacramento A, MD   30 mg at 05/05/16 0945  . lisinopril (PRINIVIL,ZESTRIL) tablet 20 mg  20 mg Oral Daily Mody, Sital, MD      . nitroGLYCERIN (NITROSTAT) SL tablet 0.4 mg  0.4 mg Sublingual Q5 min PRN Kathlyn Sacramento A, MD      . ondansetron (ZOFRAN) injection 4 mg  4 mg Intravenous Q6H PRN Kathlyn Sacramento A, MD      . sodium chloride flush (NS) 0.9 % injection 3 mL  3 mL Intravenous Q12H Wellington Hampshire, MD   3 mL at 05/05/16 2104  . sodium chloride flush (NS) 0.9 % injection 3 mL  3 mL Intravenous PRN Wellington Hampshire, MD      . ticagrelor (BRILINTA) tablet 90 mg  90 mg Oral BID Wellington Hampshire, MD   90 mg at 05/05/16 2104     Discharge Medications: Please see discharge summary for a list of discharge medications.  Relevant Imaging Results:  Relevant Lab Results:   Additional Information SS# [id number]  Zettie Pho, LCSW

## 2016-05-06 NOTE — Progress Notes (Signed)
[place] at [place] NAME: Wanda Davenport    MR#:  [id number]  DATE OF BIRTH:  03-09-1923  SUBJECTIVE:   Shortness of breath is improved. No fevers overnight. Chest x-ray shows no pulmonary edema however does show consolidation. Patient denies orthopnea, PND or lower extremity edema Denies chest pain  REVIEW OF SYSTEMS:    Review of Systems  Constitutional: Negative for fever, chills weight loss HENT: Negative for ear pain, nosebleeds, congestion, facial swelling, rhinorrhea, neck pain, neck stiffness and ear discharge.   Respiratory: Negative for cough, shortness of breath, wheezing  Cardiovascular: Negative for chest pain, palpitations and leg swelling.  Gastrointestinal: Negative for heartburn, abdominal pain, vomiting, diarrhea or consitpation Genitourinary: Negative for dysuria, urgency, frequency, hematuria Musculoskeletal: Negative for back pain or joint pain Neurological: Negative for dizziness, seizures, syncope, focal weakness,  numbness and headaches.  Hematological: Does not bruise/bleed easily.  Psychiatric/Behavioral: Negative for hallucinations, confusion, dysphoric mood    Tolerating Diet:yes      DRUG ALLERGIES:   Allergies  Allergen Reactions  . Fenofibrate Other (See Comments)    Myalgias/gi upset   . Statins Other (See Comments)    Myalgias   . Sulfonamide Derivatives Nausea And Vomiting    REACTION: nausea    VITALS:  Blood pressure (!) 123/57, pulse 75, temperature 97.5 F (36.4 C), temperature source Oral, resp. rate 20, height 5\' 4"  (1.626 m), weight 75.9 kg (167 lb 5.3 oz), SpO2 96 %.  PHYSICAL EXAMINATION:  Constitutional: Appears well-developed and well-nourished. No distress. HENT: Normocephalic. Marland Kitchen Oropharynx is clear and moist.  Eyes: Conjunctivae and EOM are normal. PERRLA, no scleral icterus.  Neck: Normal ROM. Neck supple. No JVD. No tracheal deviation. CVS: RRR, S1/S2 +, no murmurs, no  gallops, no carotid bruit.  Pulmonary: Effort Decreased breath sounds at base , no stridor, rhonchi, wheezes, rales.  Abdominal: Soft. BS +,  no distension, tenderness, rebound or guarding.  Musculoskeletal: Normal range of motion. No edema and no tenderness.  Neuro: Alert. CN 2-12 grossly intact. No focal deficits. Skin: Skin is warm and dry. No rash noted. Psychiatric: Normal mood and affect.      LABORATORY PANEL:   CBC  Recent Labs Lab 05/06/16 0508  WBC 8.1  HGB 11.1*  HCT 33.3*  PLT 256   ------------------------------------------------------------------------------------------------------------------  Chemistries   Recent Labs Lab 05/05/16 0527 05/06/16 0508  NA 135 135  K 4.3 3.9  CL 106 103  CO2 23 25  GLUCOSE 122* 101*  BUN 40* 35*  CREATININE 1.16* 1.04*  CALCIUM 9.7 9.5  MG 2.2  --    ------------------------------------------------------------------------------------------------------------------  Cardiac Enzymes  Recent Labs Lab 05/05/16 0135 05/06/16 0508  TROPONINI 3.42* 2.44*   ------------------------------------------------------------------------------------------------------------------  RADIOLOGY:  Dg Chest Port 1 View  Result Date: 05/06/2016 CLINICAL DATA:  83 year old female with un stable angina. Shortness of breath and chest pain. Respiratory failure. EXAM: PORTABLE CHEST 1 VIEW COMPARISON:  05/05/2016 and earlier. FINDINGS: Portable AP upright view at 0519 hours. Stable lung volumes. Stable mediastinal contours with borderline to mild cardiomegaly. Visualized tracheal air column is within normal limits. No pneumothorax. Mildly decreased pulmonary vascularity since yesterday with no overt edema. Continued dense retrocardiac opacity. No large pleural effusion. IMPRESSION: 1. Decreased pulmonary vascularity since yesterday.  No acute edema. 2. Bilateral lower lobe collapse or consolidation. Electronically Signed   By: Genevie Ann M.D.   On:  05/06/2016 07:13   Dg Chest Port 1 View  Result Date:  05/05/2016 CLINICAL DATA:  Acute onset shortness of breath. History of hypertension, CHF. EXAM: PORTABLE CHEST 1 VIEW COMPARISON:  Chest radiograph March 29, 2016 FINDINGS: The cardiac silhouette is mildly enlarged and unchanged. Mild chronic interstitial changes without focal consolidation. Trace LEFT pleural effusion. No pneumothorax. Osteopenia. Soft tissue planes are nonsuspicious. Mildly calcified aortic knob. IMPRESSION: Stable examination: Mild cardiomegaly, chronic interstitial changes with trace LEFT pleural effusion. Electronically Signed   By: Elon Alas M.D.   On: 05/05/2016 02:07     ASSESSMENT AND PLAN:   83 year old female with recent STEMI status post PCI to LAD who presented with shortness of breath and hypoxia.   1. Unstable angina with suspect stent thrombosis: Patient underwent cardiac catheterization which showed patent LAD and no significant change in coronary anatomy. Patient will continue aspirin, Brillanta, Zabeta, Imdur  2. Acute on chronic systolic heart failure: Patient was diuresed with Lasix and repeat chest x-ray today shows resolved pulmonary edema. Patient will continue Lasix and lisinopril. Kidney function has improved   3. PNA with most recent diagnosis of respiratory failure since last admission on 2 L oxygen: Will patient was started Levaquin based on CXR and will receive treatment for 5 days. Continue oxygen.  4. Depression: Continue Lexapro   Management plans discussed with the patient and she is in agreement.  CODE STATUS: FULL  TOTAL TIME TAKING CARE OF THIS PATIENT: 30 minutes.     POSSIBLE D/C today, DEPENDING ON CLINICAL CONDITION.   Marri Mcneff M.D on 05/06/2016 at 8:18 AM  Between 7am to 6pm - Pager - [phone] After 6pm go to [email or web address] - password [email or web address] [facility]  Office  [phone]  CC: Primary care physician; Mellody Dance,  DO  Note: This dictation was prepared with Dragon dictation along with smaller phrase technology. Any transcriptional errors that result from this process are unintentional.

## 2016-05-07 ENCOUNTER — Inpatient Hospital Stay: Payer: Medicare HMO | Admitting: Family Medicine

## 2016-05-07 ENCOUNTER — Encounter: Payer: Self-pay | Admitting: Cardiovascular Disease

## 2016-05-07 DIAGNOSIS — J189 Pneumonia, unspecified organism: Secondary | ICD-10-CM | POA: Insufficient documentation

## 2016-05-07 DIAGNOSIS — F325 Major depressive disorder, single episode, in full remission: Secondary | ICD-10-CM | POA: Insufficient documentation

## 2016-05-07 DIAGNOSIS — I25118 Atherosclerotic heart disease of native coronary artery with other forms of angina pectoris: Secondary | ICD-10-CM | POA: Diagnosis not present

## 2016-05-07 DIAGNOSIS — I5022 Chronic systolic (congestive) heart failure: Secondary | ICD-10-CM | POA: Diagnosis not present

## 2016-05-10 ENCOUNTER — Other Ambulatory Visit: Payer: Self-pay | Admitting: *Deleted

## 2016-05-10 NOTE — Patient Outreach (Signed)
Spoke with Drue Novel, SW at facility. Patient discharge home today with her family.  [facility] home care agency will provide home care services.  Patient has family staying with her through the end of month.  SW at facility provided home health agency information for private pay aide services.   Plan to sign off at this time as patient has discharged from facility.  Wanda Davenport. Laymond Purser, RN, BSN, [place] [phone]) Business Cell  [phone]) Toll Free Office

## 2016-05-11 DIAGNOSIS — R7303 Prediabetes: Secondary | ICD-10-CM | POA: Diagnosis not present

## 2016-05-11 DIAGNOSIS — I350 Nonrheumatic aortic (valve) stenosis: Secondary | ICD-10-CM | POA: Diagnosis not present

## 2016-05-11 DIAGNOSIS — K5730 Diverticulosis of large intestine without perforation or abscess without bleeding: Secondary | ICD-10-CM | POA: Diagnosis not present

## 2016-05-11 DIAGNOSIS — I110 Hypertensive heart disease with heart failure: Secondary | ICD-10-CM | POA: Diagnosis not present

## 2016-05-11 DIAGNOSIS — I447 Left bundle-branch block, unspecified: Secondary | ICD-10-CM | POA: Diagnosis not present

## 2016-05-11 DIAGNOSIS — I2102 ST elevation (STEMI) myocardial infarction involving left anterior descending coronary artery: Secondary | ICD-10-CM | POA: Diagnosis not present

## 2016-05-11 DIAGNOSIS — I5032 Chronic diastolic (congestive) heart failure: Secondary | ICD-10-CM | POA: Diagnosis not present

## 2016-05-11 DIAGNOSIS — I429 Cardiomyopathy, unspecified: Secondary | ICD-10-CM | POA: Diagnosis not present

## 2016-05-11 DIAGNOSIS — M8580 Other specified disorders of bone density and structure, unspecified site: Secondary | ICD-10-CM | POA: Diagnosis not present

## 2016-05-15 ENCOUNTER — Inpatient Hospital Stay: Payer: Medicare HMO

## 2016-05-15 ENCOUNTER — Emergency Department: Payer: Medicare HMO

## 2016-05-15 ENCOUNTER — Encounter: Payer: Self-pay | Admitting: Emergency Medicine

## 2016-05-15 ENCOUNTER — Inpatient Hospital Stay
Admission: EM | Admit: 2016-05-15 | Discharge: 2016-05-17 | DRG: 175 | Disposition: A | Discharge status: Home Health | Payer: Medicare HMO | Attending: Internal Medicine | Admitting: Internal Medicine

## 2016-05-15 DIAGNOSIS — R0602 Shortness of breath: Secondary | ICD-10-CM | POA: Diagnosis not present

## 2016-05-15 DIAGNOSIS — Z882 Allergy status to sulfonamides status: Secondary | ICD-10-CM | POA: Diagnosis not present

## 2016-05-15 DIAGNOSIS — I2510 Atherosclerotic heart disease of native coronary artery without angina pectoris: Secondary | ICD-10-CM | POA: Diagnosis not present

## 2016-05-15 DIAGNOSIS — Z888 Allergy status to other drugs, medicaments and biological substances status: Secondary | ICD-10-CM

## 2016-05-15 DIAGNOSIS — Z87891 Personal history of nicotine dependence: Secondary | ICD-10-CM | POA: Diagnosis not present

## 2016-05-15 DIAGNOSIS — R7989 Other specified abnormal findings of blood chemistry: Secondary | ICD-10-CM | POA: Diagnosis not present

## 2016-05-15 DIAGNOSIS — F419 Anxiety disorder, unspecified: Secondary | ICD-10-CM | POA: Diagnosis present

## 2016-05-15 DIAGNOSIS — I2699 Other pulmonary embolism without acute cor pulmonale: Secondary | ICD-10-CM

## 2016-05-15 DIAGNOSIS — M8580 Other specified disorders of bone density and structure, unspecified site: Secondary | ICD-10-CM | POA: Diagnosis present

## 2016-05-15 DIAGNOSIS — Z955 Presence of coronary angioplasty implant and graft: Secondary | ICD-10-CM

## 2016-05-15 DIAGNOSIS — I82401 Acute embolism and thrombosis of unspecified deep veins of right lower extremity: Secondary | ICD-10-CM | POA: Diagnosis not present

## 2016-05-15 DIAGNOSIS — N183 Chronic kidney disease, stage 3 (moderate): Secondary | ICD-10-CM | POA: Diagnosis not present

## 2016-05-15 DIAGNOSIS — Z66 Do not resuscitate: Secondary | ICD-10-CM | POA: Diagnosis present

## 2016-05-15 DIAGNOSIS — Z7982 Long term (current) use of aspirin: Secondary | ICD-10-CM | POA: Diagnosis not present

## 2016-05-15 DIAGNOSIS — E079 Disorder of thyroid, unspecified: Secondary | ICD-10-CM

## 2016-05-15 DIAGNOSIS — E785 Hyperlipidemia, unspecified: Secondary | ICD-10-CM | POA: Diagnosis present

## 2016-05-15 DIAGNOSIS — I110 Hypertensive heart disease with heart failure: Secondary | ICD-10-CM | POA: Diagnosis present

## 2016-05-15 DIAGNOSIS — I5032 Chronic diastolic (congestive) heart failure: Secondary | ICD-10-CM | POA: Diagnosis not present

## 2016-05-15 DIAGNOSIS — I2609 Other pulmonary embolism with acute cor pulmonale: Secondary | ICD-10-CM | POA: Diagnosis not present

## 2016-05-15 DIAGNOSIS — R0603 Acute respiratory distress: Secondary | ICD-10-CM | POA: Diagnosis not present

## 2016-05-15 DIAGNOSIS — E041 Nontoxic single thyroid nodule: Secondary | ICD-10-CM | POA: Diagnosis not present

## 2016-05-15 DIAGNOSIS — I5030 Unspecified diastolic (congestive) heart failure: Secondary | ICD-10-CM

## 2016-05-15 DIAGNOSIS — Z7951 Long term (current) use of inhaled steroids: Secondary | ICD-10-CM | POA: Diagnosis not present

## 2016-05-15 DIAGNOSIS — K645 Perianal venous thrombosis: Secondary | ICD-10-CM | POA: Diagnosis not present

## 2016-05-15 DIAGNOSIS — I10 Essential (primary) hypertension: Secondary | ICD-10-CM | POA: Diagnosis not present

## 2016-05-15 DIAGNOSIS — J810 Acute pulmonary edema: Secondary | ICD-10-CM | POA: Diagnosis not present

## 2016-05-15 DIAGNOSIS — I5021 Acute systolic (congestive) heart failure: Secondary | ICD-10-CM | POA: Diagnosis not present

## 2016-05-15 DIAGNOSIS — J90 Pleural effusion, not elsewhere classified: Secondary | ICD-10-CM | POA: Diagnosis not present

## 2016-05-15 LAB — COMPREHENSIVE METABOLIC PANEL
ALBUMIN: 3.6 g/dL (ref 3.5–5.0)
ALT: 10 U/L — ABNORMAL LOW (ref 14–54)
AST: 17 U/L (ref 15–41)
Alkaline Phosphatase: 76 U/L (ref 38–126)
Anion gap: 9 (ref 5–15)
BILIRUBIN TOTAL: 0.7 mg/dL (ref 0.3–1.2)
BUN: 36 mg/dL — AB (ref 6–20)
CHLORIDE: 107 mmol/L (ref 101–111)
CO2: 21 mmol/L — AB (ref 22–32)
Calcium: 10 mg/dL (ref 8.9–10.3)
Creatinine, Ser: 1.15 mg/dL — ABNORMAL HIGH (ref 0.44–1.00)
GFR calc Af Amer: 47 mL/min — ABNORMAL LOW (ref >60)
GFR calc non Af Amer: 40 mL/min — ABNORMAL LOW (ref >60)
GLUCOSE: 106 mg/dL — AB (ref 65–99)
POTASSIUM: 4.3 mmol/L (ref 3.5–5.1)
SODIUM: 137 mmol/L (ref 135–145)
TOTAL PROTEIN: 6.7 g/dL (ref 6.5–8.1)

## 2016-05-15 LAB — CBC WITH DIFFERENTIAL/PLATELET
BASOS ABS: 0 10*3/uL (ref 0–0.1)
BASOS PCT: 0 %
EOS ABS: 0.5 10*3/uL (ref 0–0.7)
Eosinophils Relative: 4 %
HEMATOCRIT: 36.8 % (ref 35.0–47.0)
Hemoglobin: 12.1 g/dL (ref 12.0–16.0)
Lymphocytes Relative: 14 %
Lymphs Abs: 1.6 10*3/uL (ref 1.0–3.6)
MCH: 28.5 pg (ref 26.0–34.0)
MCHC: 32.9 g/dL (ref 32.0–36.0)
MCV: 86.7 fL (ref 80.0–100.0)
Monocytes Absolute: 1 10*3/uL — ABNORMAL HIGH (ref 0.2–0.9)
Monocytes Relative: 8 %
NEUTROS ABS: 8.8 10*3/uL — AB (ref 1.4–6.5)
NEUTROS PCT: 74 %
Platelets: 255 10*3/uL (ref 150–440)
RBC: 4.25 MIL/uL (ref 3.80–5.20)
RDW: 15.9 % — AB (ref 11.5–14.5)
WBC: 11.9 10*3/uL — ABNORMAL HIGH (ref 3.6–11.0)

## 2016-05-15 LAB — LACTIC ACID, PLASMA: LACTIC ACID, VENOUS: 1.2 mmol/L (ref 0.5–1.9)

## 2016-05-15 LAB — APTT: APTT: 28 s (ref 24–36)

## 2016-05-15 LAB — HEPARIN LEVEL (UNFRACTIONATED): HEPARIN UNFRACTIONATED: 0.69 [IU]/mL (ref 0.30–0.70)

## 2016-05-15 LAB — PROTIME-INR
INR: 0.98
PROTHROMBIN TIME: 13 s (ref 11.4–15.2)

## 2016-05-15 LAB — TROPONIN I
TROPONIN I: 0.09 ng/mL — AB (ref <0.03)
Troponin I: 0.1 ng/mL (ref <0.03)
Troponin I: 0.09 ng/mL (ref <0.03)

## 2016-05-15 LAB — BRAIN NATRIURETIC PEPTIDE: B Natriuretic Peptide: 1707 pg/mL — ABNORMAL HIGH (ref 0.0–100.0)

## 2016-05-15 MED ORDER — ONDANSETRON HCL 4 MG PO TABS: 4.0000 mg | ORAL_TABLET | Freq: Four times a day (QID) | ORAL | Status: DC | PRN

## 2016-05-15 MED ORDER — LISINOPRIL 20 MG PO TABS
20.0000 mg | ORAL_TABLET | Freq: Every day | ORAL | Status: DC
  Administered 2016-05-16 – 2016-05-17 (×2): 20 mg via ORAL
  Filled 2016-05-15 (×3): qty 1

## 2016-05-15 MED ORDER — NAPHAZOLINE-PHENIRAMINE 0.025-0.3 % OP SOLN: 1.0000 [drp] | Freq: Four times a day (QID) | OPHTHALMIC | Status: DC | PRN

## 2016-05-15 MED ORDER — ESCITALOPRAM OXALATE 10 MG PO TABS
10.0000 mg | ORAL_TABLET | Freq: Every day | ORAL | Status: DC
  Administered 2016-05-15 – 2016-05-17 (×3): 10 mg via ORAL
  Filled 2016-05-15 (×3): qty 1

## 2016-05-15 MED ORDER — NITROGLYCERIN 0.4 MG SL SUBL: 0.4000 mg | SUBLINGUAL_TABLET | SUBLINGUAL | Status: DC | PRN

## 2016-05-15 MED ORDER — FUROSEMIDE 10 MG/ML IJ SOLN
40.0000 mg | Freq: Once | INTRAMUSCULAR | Status: DC
  Filled 2016-05-15: qty 4

## 2016-05-15 MED ORDER — FUROSEMIDE 10 MG/ML IJ SOLN
20.0000 mg | Freq: Two times a day (BID) | INTRAMUSCULAR | Status: DC
Start: 2016-05-15 — End: 2016-05-15
  Administered 2016-05-15: 20 mg via INTRAVENOUS

## 2016-05-15 MED ORDER — ASPIRIN EC 325 MG PO TBEC
325.0000 mg | DELAYED_RELEASE_TABLET | Freq: Every day | ORAL | Status: DC
  Administered 2016-05-15 – 2016-05-17 (×3): 325 mg via ORAL
  Filled 2016-05-15 (×3): qty 1

## 2016-05-15 MED ORDER — ALPRAZOLAM 0.25 MG PO TABS
0.2500 mg | ORAL_TABLET | Freq: Two times a day (BID) | ORAL | Status: DC | PRN
  Administered 2016-05-15: 0.25 mg via ORAL
  Filled 2016-05-15: qty 1

## 2016-05-15 MED ORDER — BISOPROLOL FUMARATE 5 MG PO TABS
10.0000 mg | ORAL_TABLET | Freq: Every day | ORAL | Status: DC
  Administered 2016-05-16 – 2016-05-17 (×2): 10 mg via ORAL
  Filled 2016-05-15 (×4): qty 2

## 2016-05-15 MED ORDER — ALBUTEROL SULFATE (2.5 MG/3ML) 0.083% IN NEBU: 2.5000 mg | INHALATION_SOLUTION | Freq: Four times a day (QID) | RESPIRATORY_TRACT | Status: DC | PRN

## 2016-05-15 MED ORDER — CALCIUM CARBONATE-VITAMIN D 500-200 MG-UNIT PO TABS
1.0000 | ORAL_TABLET | Freq: Two times a day (BID) | ORAL | Status: DC
  Administered 2016-05-15 – 2016-05-17 (×4): 1 via ORAL
  Filled 2016-05-15 (×4): qty 1

## 2016-05-15 MED ORDER — FLUTICASONE PROPIONATE 50 MCG/ACT NA SUSP
2.0000 | Freq: Every day | NASAL | Status: DC
  Administered 2016-05-16 – 2016-05-17 (×2): 2 via NASAL
  Filled 2016-05-15: qty 16

## 2016-05-15 MED ORDER — ISOSORBIDE MONONITRATE ER 30 MG PO TB24
30.0000 mg | ORAL_TABLET | Freq: Every day | ORAL | Status: DC
  Administered 2016-05-16 – 2016-05-17 (×2): 30 mg via ORAL
  Filled 2016-05-15 (×3): qty 1

## 2016-05-15 MED ORDER — HEPARIN BOLUS VIA INFUSION
4000.0000 [IU] | Freq: Once | INTRAVENOUS | Status: AC
  Administered 2016-05-15: 4000 [IU] via INTRAVENOUS
  Filled 2016-05-15: qty 4000

## 2016-05-15 MED ORDER — SENNOSIDES-DOCUSATE SODIUM 8.6-50 MG PO TABS: 1.0000 | ORAL_TABLET | Freq: Every evening | ORAL | Status: DC | PRN

## 2016-05-15 MED ORDER — ALBUTEROL SULFATE (2.5 MG/3ML) 0.083% IN NEBU
5.0000 mg | INHALATION_SOLUTION | Freq: Once | RESPIRATORY_TRACT | Status: AC
  Administered 2016-05-15: 5 mg via RESPIRATORY_TRACT
  Filled 2016-05-15: qty 6

## 2016-05-15 MED ORDER — ALBUTEROL SULFATE HFA 108 (90 BASE) MCG/ACT IN AERS: 2.0000 | INHALATION_SPRAY | Freq: Four times a day (QID) | RESPIRATORY_TRACT | Status: DC | PRN

## 2016-05-15 MED ORDER — ACETAMINOPHEN 325 MG PO TABS: 650.0000 mg | ORAL_TABLET | Freq: Four times a day (QID) | ORAL | Status: DC | PRN

## 2016-05-15 MED ORDER — ONDANSETRON HCL 4 MG/2ML IJ SOLN: 4.0000 mg | Freq: Four times a day (QID) | INTRAMUSCULAR | Status: DC | PRN

## 2016-05-15 MED ORDER — IOPAMIDOL (ISOVUE-370) INJECTION 76%
60.0000 mL | Freq: Once | INTRAVENOUS | Status: AC | PRN
  Administered 2016-05-15: 60 mL via INTRAVENOUS

## 2016-05-15 MED ORDER — ACETAMINOPHEN 650 MG RE SUPP: 650.0000 mg | Freq: Four times a day (QID) | RECTAL | Status: DC | PRN

## 2016-05-15 MED ORDER — HEPARIN (PORCINE) IN NACL 100-0.45 UNIT/ML-% IJ SOLN
1050.0000 [IU]/h | INTRAMUSCULAR | Status: AC
  Administered 2016-05-15: 1150 [IU]/h via INTRAVENOUS
  Administered 2016-05-16: 1050 [IU]/h via INTRAVENOUS
  Filled 2016-05-15 (×2): qty 250

## 2016-05-15 NOTE — Progress Notes (Signed)
ANTICOAGULATION CONSULT NOTE - Initial Consult  Pharmacy Consult for Heparin Drip Indication: pulmonary embolus  Allergies  Allergen Reactions  . Fenofibrate Other (See Comments)    Myalgias/gi upset   . Statins Other (See Comments)    Myalgias   . Sulfonamide Derivatives Nausea And Vomiting    REACTION: nausea    Patient Measurements: Height: 5\' 2"  (157.5 cm) Weight: 161 lb (73 kg) IBW/kg (Calculated) : 50.1 Heparin Dosing Weight: 65.74 kg  Vital Signs: Temp: 97.8 F (36.6 C) (05/15 0652) Temp Source: Oral (05/15 0652) BP: 122/59 (05/15 0730) Pulse Rate: 73 (05/15 0730)  Labs:  Recent Labs  05/15/16 0700  HGB 12.1  HCT 36.8  PLT 255  APTT 28  LABPROT 13.0  INR 0.98  CREATININE 1.15*  TROPONINI 0.10*    Estimated Creatinine Clearance: 29.8 mL/min (A) (by C-G formula based on SCr of 1.15 mg/dL (H)).   Medical History: Past Medical History:  Diagnosis Date  . Allergic rhinitis   . Anxiety   . Arthritis   . Atypical chest pain   . Bronchitis 01/14/2016  . Chronic diastolic CHF (congestive heart failure) (HCC)    Echo 06/2010 EF 60-65%, no RWMAs, grade 1 diastolic dysfunction, mild LAE, PASP 66mmHg.  Marland Kitchen DIVERTICULOSIS, COLON   . HEMORRHOIDS, INTERNAL   . Hepatic steatosis   . Hyperlipidemia   . HYPERTENSION   . LBBB (left bundle branch block)    a. present on ECG 06/2010  . Osteopenia   . Pre-diabetes    A1c 6.0    Medications:  Scheduled:  . furosemide  20 mg Intravenous Q12H   Infusions:  . heparin 1,150 Units/hr (05/15/16 0924)    Assessment: Heparin drip ordered for PE in this 83 yo female presenting with shortness of breath.   Goal of Therapy:  Heparin level 0.3-0.7 units/ml Monitor platelets by anticoagulation protocol: Yes   Plan:  Give 4000 units bolus x 1 Start heparin infusion at 1150 units/hr Check anti-Xa level in 8 hours and daily while on heparin Continue to monitor H&H and platelets   HL ordered for tonight at  17:30.  Olivia Canter, RPH 05/15/2016,9:27 AM

## 2016-05-15 NOTE — ED Provider Notes (Signed)
[facility] Provider Note   CSN: [id number] Arrival date & time: 05/15/16  2952     History   Chief Complaint Chief Complaint  Patient presents with  . Shortness of Breath    HPI Wanda Davenport is a 83 y.o. female history of diastolic CHF, hyperlipidemia, hypertension here presenting with shortness of breath, dyspnea on exertion. Patient was recently admitted for unstable angina and had cath that showed no obvious instent thrombosis. Patient was discharged from about a week ago and she was continued on her Brilinta and other medicines. Patient states that she is feeling fine until yesterday. She started having shortness of breath with exertion, even with walking to the bathroom. Overnight she felt worsening short of breath and woke up around 4:30 AM with trouble breathing. She denies any chest pain. States that her legs are minimally swollen but denies worsening leg swelling.    The history is provided by the patient, the nursing home and a relative.    Past Medical History:  Diagnosis Date  . Allergic rhinitis   . Anxiety   . Arthritis   . Atypical chest pain   . Bronchitis 01/14/2016  . Chronic diastolic CHF (congestive heart failure) (HCC)    Echo 06/2010 EF 60-65%, no RWMAs, grade 1 diastolic dysfunction, mild LAE, PASP 69mmHg.  Marland Kitchen DIVERTICULOSIS, COLON   . HEMORRHOIDS, INTERNAL   . Hepatic steatosis   . Hyperlipidemia   . HYPERTENSION   . LBBB (left bundle branch block)    a. present on ECG 06/2010  . Osteopenia   . Pre-diabetes    A1c 6.0    Patient Active Problem List   Diagnosis Date Noted  . Acute on chronic systolic (congestive) heart failure ([place]) 05/05/2016  . Unstable angina ([place])   . Acute ST elevation myocardial infarction (STEMI) involving left anterior descending (LAD) coronary artery ([place]) 04/26/2016  . STEMI (ST elevation myocardial infarction) ([facility]) 04/26/2016  . Knee pain, bilateral 04/10/2016  . Medication monitoring encounter  01/26/2016  . Intolerance of drug- statins 01/25/2016  . Counseling regarding end of life decision making 01/25/2016  . Hypertriglyceridemia 12/13/2015  . Low serum HDL 12/13/2015  . B12 deficiency 12/13/2015  . Claudication of left lower extremity ([place]) 12/13/2015  . Vitamin D deficiency 11/06/2015  . Adjustment disorder with mixed anxiety and depressed mood 11/06/2015  . Basal cell carcinoma 10/31/2015  . Environmental and seasonal allergies 10/31/2015  . Reactive airway disease- only spring and fall due to allergies 10/31/2015  . GAD (generalized anxiety disorder) 10/13/2015  . Atypical chest pain 04/16/2011  . Chronic diastolic heart failure ([place]) 04/16/2011  . Hyperlipidemia   . Pre-diabetes   . chronic LBBB (left bundle branch block)- since 2012   . Generalized OA   . Allergic rhinitis   . DIVERTICULOSIS, COLON 12/03/2007  . Hypertension with CHF ( diastolic dysf grade I)  84/13/2440    Past Surgical History:  Procedure Laterality Date  . ABDOMINAL HYSTERECTOMY    . BREAST SURGERY    . CATARACT EXTRACTION W/PHACO Left 05/12/2014   Procedure: CATARACT EXTRACTION PHACO AND INTRAOCULAR LENS PLACEMENT (IOC);  Surgeon: Leandrew Koyanagi, MD;  Location: [facility];  Service: Ophthalmology;  Laterality: Left;  . COLONOSCOPY     a. 2010  . CORONARY STENT INTERVENTION N/A 04/26/2016   Procedure: Coronary Stent Intervention;  Surgeon: Isaias Cowman, MD;  Location: [facility];  Service: Cardiovascular;  Laterality: N/A;  . LEFT HEART CATH AND CORONARY ANGIOGRAPHY N/A 04/26/2016  Procedure: Left Heart Cath and Coronary Angiography;  Surgeon: Isaias Cowman, MD;  Location: [facility];  Service: Cardiovascular;  Laterality: N/A;  . LEFT HEART CATH AND CORONARY ANGIOGRAPHY N/A 05/05/2016   Procedure: Left Heart Cath and Coronary Angiography;  Surgeon: Wellington Hampshire, MD;  Location: [facility];  Service: Cardiovascular;  Laterality: N/A;    . TONSILLECTOMY      OB History    No data available       Home Medications    Prior to Admission medications   Medication Sig Start Date End Date Taking? Authorizing Provider  albuterol (PROVENTIL HFA;VENTOLIN HFA) 108 (90 BASE) MCG/ACT inhaler Inhale 2 puffs into the lungs every 6 (six) hours as needed. Wheezing or shortness of breath   Yes [provider]  ALPRAZolam (XANAX) 0.25 MG tablet Take 1 tablet (0.25 mg total) by mouth 2 (two) times daily as needed for anxiety. 04/30/16  Yes Fritzi Mandes, MD  aspirin 325 MG EC tablet Take 325 mg by mouth daily.   Yes [provider]  bisoprolol (ZEBETA) 10 MG tablet Take 1 tablet (10 mg total) by mouth daily. 03/29/16  Yes Lillard Anes D, NP  Calcium Carbonate-Vitamin D 600-400 MG-UNIT tablet Take 2 tablets by mouth daily. Patient taking differently: Take 1 tablet by mouth 2 (two) times daily.  01/25/16  Yes Opalski, Deborah, DO  escitalopram (LEXAPRO) 10 MG tablet TAKE 1 TABLET (10 MG TOTAL) BY MOUTH DAILY. 04/27/16  Yes Opalski, Deborah, DO  fluticasone (FLONASE) 50 MCG/ACT nasal spray Place 2 sprays into the nose daily. Nasal congestion Rarely used    Yes [provider]  furosemide (LASIX) 20 MG tablet Take 1 tablet (20 mg total) by mouth daily. 05/07/16  Yes Bettey Costa, MD  isosorbide mononitrate (IMDUR) 30 MG 24 hr tablet Take 1 tablet (30 mg total) by mouth daily. 04/30/16  Yes Fritzi Mandes, MD  lisinopril (PRINIVIL,ZESTRIL) 20 MG tablet Take 1 tablet by mouth daily. 02/27/16 02/26/17 Yes [provider]  naphazoline-pheniramine (NAPHCON-A) 0.025-0.3 % ophthalmic solution Place 1 drop into both eyes 4 (four) times daily as needed for irritation.   Yes [provider]  nitroGLYCERIN (NITROSTAT) 0.3 MG SL tablet Place 0.3 mg under the tongue every 5 (five) minutes as needed for chest pain.   Yes [provider]  montelukast (SINGULAIR) 10 MG tablet Take 1 tablet (10 mg total) by mouth at bedtime.  For wheeze/SOB Patient not taking: Reported on 05/15/2016 04/27/16   Mellody Dance, DO  ticagrelor (BRILINTA) 90 MG TABS tablet Take 1 tablet (90 mg total) by mouth 2 (two) times daily. 04/29/16   Fritzi Mandes, MD    Family History Family History  Problem Relation Age of Onset  . Colon cancer Sister   . Melanoma Sister   . Hyperlipidemia Brother   . Hypertension Mother   . Lupus Mother        died 57 from surgical complications  . Alzheimer's disease Brother        unknown  . Kidney disease Father        died 4  . Hypertension Father   . Alcohol abuse Father     Social History Social History  Substance Use Topics  . Smoking status: Former Smoker    Packs/day: 0.25    Years: 20.00    Types: Cigarettes    Quit date: 01/02/1971  . Smokeless tobacco: Never Used     Comment: smoked a few cigarettes/day x  20 yrs - quit @ age 31.  Marland Kitchen Alcohol use No     Allergies   Fenofibrate; Statins; and Sulfonamide derivatives   Review of Systems Review of Systems  Respiratory: Positive for shortness of breath.   All other systems reviewed and are negative.    Physical Exam Updated Vital Signs BP (!) 122/59   Pulse 73   Temp 97.8 F (36.6 C) (Oral)   Resp 16   Ht 5\' 2"  (1.575 m)   Wt 161 lb (73 kg)   SpO2 93%   BMI 29.45 kg/m   Physical Exam  Constitutional: She is oriented to person, place, and time.  Chronically ill, slightly tachypneic   HENT:  Head: Normocephalic.  Eyes: EOM are normal. Pupils are equal, round, and reactive to light.  Neck: Normal range of motion. Neck supple.  Cardiovascular: Normal rate, regular rhythm and normal heart sounds.   Pulmonary/Chest:  Slightly tachypneic, diminished bilateral bases, minimal wheezing upper lobes   Abdominal: Soft. Bowel sounds are normal. She exhibits no distension. There is no tenderness.  Musculoskeletal:  1+ edema bilateral legs, no calf tenderness   Neurological: She is alert and oriented to person, place, and  time. No cranial nerve deficit. Coordination normal.  Skin: Skin is warm.  Psychiatric: She has a normal mood and affect.  Nursing note and vitals reviewed.    ED Treatments / Results  Labs (all labs ordered are listed, but only abnormal results are displayed) Labs Reviewed  CBC WITH DIFFERENTIAL/PLATELET - Abnormal; Notable for the following:       Result Value   WBC 11.9 (*)    RDW 15.9 (*)    Neutro Abs 8.8 (*)    Monocytes Absolute 1.0 (*)    All other components within normal limits  COMPREHENSIVE METABOLIC PANEL - Abnormal; Notable for the following:    CO2 21 (*)    Glucose, Bld 106 (*)    BUN 36 (*)    Creatinine, Ser 1.15 (*)    ALT 10 (*)    GFR calc non Af Amer 40 (*)    GFR calc Af Amer 47 (*)    All other components within normal limits  TROPONIN I - Abnormal; Notable for the following:    Troponin I 0.10 (*)    All other components within normal limits  BRAIN NATRIURETIC PEPTIDE - Abnormal; Notable for the following:    B Natriuretic Peptide 1,707.0 (*)    All other components within normal limits  PROTIME-INR  LACTIC ACID, PLASMA  APTT    EKG  EKG Interpretation  Date/Time:  Tuesday May 15 2016 06:52:03 EDT Ventricular Rate:  75 PR Interval:    QRS Duration: 159 QT Interval:  488 QTC Calculation: 546 R Axis:   -48 Text Interpretation:  Atrial fibrillation Left bundle branch block Baseline wander in lead(s) V6 No significant change since last tracing Confirmed by YAO  MD, DAVID ([id number]) on 05/15/2016 7:07:47 AM       Radiology Dg Chest 2 View  Result Date: 05/15/2016 CLINICAL DATA:  83 year old female with progressive shortness of breath since yesterday. EXAM: CHEST  2 VIEW COMPARISON:  05/06/2016 and earlier. FINDINGS: Seated AP and lateral views of the chest. Stable cardiac size and mediastinal contours. Visualized tracheal air column is within normal limits. New left lung base and medial costophrenic angle opacity resembles a subpulmonic left  pleural effusion. Small volume of fluid tracking in the bilateral pleural fissures including the right minor fissure. No  superimposed pneumothorax. Mild pulmonary vascular congestion without overt edema. Calcified aortic atherosclerosis. Stable visualized osseous structures. Negative visible bowel gas pattern. IMPRESSION: 1. Left lung base opacity favored due to small or moderate size left subpulmonic pleural effusion. Left lower lobe consolidation (such as due to aspiration or pneumonia) is felt less likely. 2. Small volume fluid in the bilateral pleural fissures. Mild pulmonary vascular congestion without overt edema. Electronically Signed   By: Genevie Ann M.D.   On: 05/15/2016 07:38   Ct Angio Chest Pe W Or Wo Contrast  Result Date: 05/15/2016 CLINICAL DATA:  Shortness of Breath EXAM: CT ANGIOGRAPHY CHEST WITH CONTRAST TECHNIQUE: Multidetector CT imaging of the chest was performed using the standard protocol during bolus administration of intravenous contrast. Multiplanar CT image reconstructions and MIPs were obtained to evaluate the vascular anatomy. CONTRAST:  60 mL Isovue 370 nonionic COMPARISON:  Chest radiograph May 15, 2016 FINDINGS: Cardiovascular: There are multiple right-sided pulmonary emboli rising from the distal right main pulmonary artery with extension into multiple upper and lower lobe pulmonary artery branches. No pulmonary embolus identified to the left of midline. The right ventricle to left ventricular diameter ratio is less than 0.9, not consistent with right heart strain. There is no thoracic aortic aneurysm or dissection. There is moderate atherosclerotic calcification in the proximal left subclavian and right common carotid arteries. There are multiple foci of atherosclerotic calcification in the aorta. There are foci of coronary artery calcification. Pericardium is not appreciably thickened. Mediastinum/Nodes: The thyroid has an inhomogeneous appearance. There is a mass arising from the  left lobe of the thyroid measuring 2.2 x 1.6 cm. There are several mildly prominent mediastinal lymph nodes. To the right of the distal trachea, there is a lymph node measuring 1.5 x 1.0 cm. At the level of the aortic arch to the left, there is a lymph node measuring 1.9 x 1.1 cm. There is a lymph node in the right hilar region measuring 1.6 x 1.0 cm. There is a small hiatal hernia. Lungs/Pleura: There are moderate free-flowing pleural effusions bilaterally with consolidation in both lung bases, more pronounced on the left than on the right. There is mild interstitial edema. There is patchy atelectasis in the left upper lobe as well as in both lung bases. Upper Abdomen: In the visualized upper abdomen, there is atherosclerotic calcification in the aorta and major mesenteric branches. Visualized upper abdominal structures otherwise appear unremarkable. Musculoskeletal: There is degenerative change in the thoracic and visualized upper lumbar regions. There are no blastic or lytic bone lesions. Review of the MIP images confirms the above findings. IMPRESSION: Multiple pulmonary emboli on the right without right heart strain. Evidence a degree of congestive heart failure. Mild pulmonary edema with cardiomegaly and moderate pleural effusions. Bibasilar consolidation, somewhat more on the left than the right. This consolidation is largely due to compressive atelectasis, although superimposed pneumonia must be of concern in these areas, particularly on the left. Several mildly enlarged lymph nodes of uncertain etiology. These lymph nodes may well have reactive etiology given the changes in the lungs. Multiple foci of atherosclerotic calcification as well as foci of coronary artery calcification. **An incidental finding of potential clinical significance has been found. Left lobe thyroid mass measuring 2.2 x 1.6 cm. Consider further evaluation with thyroid ultrasound. If patient is clinically hyperthyroid, consider nuclear  medicine thyroid uptake and scan.** Critical Value/emergent results were called by telephone at the time of interpretation on 05/15/2016 at 8:36 am to Dr. Shirlyn Goltz , who verbally  acknowledged these results. Electronically Signed   By: Lowella Grip III M.D.   On: 05/15/2016 08:36    Procedures Procedures (including critical care time)  CRITICAL CARE Performed by: Wandra Arthurs   Total critical care time: 30  minutes  Critical care time was exclusive of separately billable procedures and treating other patients.  Critical care was necessary to treat or prevent imminent or life-threatening deterioration.  Critical care was time spent personally by me on the following activities: development of treatment plan with patient and/or surrogate as well as nursing, discussions with consultants, evaluation of patient's response to treatment, examination of patient, obtaining history from patient or surrogate, ordering and performing treatments and interventions, ordering and review of laboratory studies, ordering and review of radiographic studies, pulse oximetry and re-evaluation of patient's condition.   Medications Ordered in ED Medications  furosemide (LASIX) injection 40 mg (not administered)  heparin bolus via infusion 4,000 Units (not administered)  heparin ADULT infusion 100 units/mL (25000 units/243mL sodium chloride 0.45%) (not administered)  albuterol (PROVENTIL) (2.5 MG/3ML) 0.083% nebulizer solution 5 mg (5 mg Nebulization Given 05/15/16 0728)  iopamidol (ISOVUE-370) 76 % injection 60 mL (60 mLs Intravenous Contrast Given 05/15/16 0815)     Initial Impression / Assessment and Plan / ED Course  I have reviewed the triage vital signs and the nursing notes.  Pertinent labs & imaging results that were available during my care of the patient were reviewed by me and considered in my medical decision making (see chart for details).     FLORABEL FAULKS is a 83 y.o. female here with SOB,  dyspnea on exertion. Had recent NSTEMI. Concerned for NSTEMI vs CHF vs PE vs bronchitis. Will get labs, BNP, Trop, CXR.    8:49 AM BNP increased to 1700. CT angio showed L side multiple PEs. O2 borderline 92-93%. Started on heparin. Will admit for CHF, PE.   Final Clinical Impressions(s) / ED Diagnoses   Final diagnoses:  Shortness of breath    New Prescriptions New Prescriptions   No medications on file     Drenda Freeze, MD 05/15/16 [phone]

## 2016-05-15 NOTE — Progress Notes (Signed)
Family Meeting Note  Advance Directive:yes  Today a meeting took place with the patient and daughter in the ER  The following clinical team members were present during this meeting: Patient, daughter, M.D.   The following were discussed:Patient's diagnosis: Patient is being admitted with acute pulmonary embolism right side and recent history of coronary artery disease with stent in LAD.  Discussed CODE STATUS with patient and daughter did request DO NOT RESUSCITATE  Time spent during discussion:16 mins  Samiyyah Moffa, MD

## 2016-05-15 NOTE — Progress Notes (Signed)
PT Cancellation Note  Patient Details Name: Wanda Davenport MRN: [id number] DOB: [birth year]   Cancelled Treatment:    Reason Eval/Treat Not Completed: Medical issues which prohibited therapy.  PT consult received.  Chart reviewed.  CT angio of chest showing multiple pulmonary emboli on R.  Per PT policy for pulmonary emboli, will hold PT for 48 hours from the time and date of administration of anticoagulant medication (05/15/16 at 0924 IV heparin drip started).  Will re-attempt PT eval on 05/17/16 after 0924.  Leitha Bleak, PT 05/15/16, 2:37 PM [phone]

## 2016-05-15 NOTE — Consult Note (Signed)
[facility] Consultation Note  Patient ID: Wanda Davenport, MRN: [id number], DOB/AGE: 05-15-1923 83 y.o. Admit date: 05/15/2016   Date of Consult: 05/15/2016 Primary Physician: Mellody Dance, DO Primary [place]  Chief Complaint:  Chief Complaint  Patient presents with  . Shortness of Breath   Reason for Consult: shortness of breath with known coronary disease and recent myocardial infarction  HPI: 83 y.o. female with known coronary artery disease status post recent ST elevation myocardial infarction and now status post PCI and stent placement of left anterior descending artery March 2018 for which the patient was placed on appropriate medication management and has not taking for medication management for her stent. She had left bundle branch block in the past and has had no new changes of left bundle branch block. She has had recent worsening severe shortness of breath but no evidence of chest pain or pressure in the last several days now seen in the emergency room. At that time she had some mild hypoxia but the majority of her issues was pulmonary embolism seen by CAT scan. She has been placed on heparin and oxygen and significantly improved at this time and feels better. She was not taking dual antiplatelet therapy after her myocardial infarction and only taking aspirin. Now she will need anticoagulation and therefore dual therapy would be more appropriate in an elderly female with concerns of bleeding risks. Current EKG shows normal sinus rhythm with first-degree AV block and left bundle branch block. Initial troponin is 0.1 but BNP is 01/07/2005 consistent with right-sided heart failure from pulmonary embolism  Past Medical History:  Diagnosis Date  . Allergic rhinitis   . Anxiety   . Arthritis   . Atypical chest pain   . Bronchitis 01/14/2016  . Chronic diastolic CHF (congestive heart failure) (HCC)    Echo 06/2010 EF 60-65%, no RWMAs, grade 1 diastolic  dysfunction, mild LAE, PASP 75mmHg.  Marland Kitchen DIVERTICULOSIS, COLON   . HEMORRHOIDS, INTERNAL   . Hepatic steatosis   . Hyperlipidemia   . HYPERTENSION   . LBBB (left bundle branch block)    a. present on ECG 06/2010  . Osteopenia   . Pre-diabetes    A1c 6.0      Surgical History:  Past Surgical History:  Procedure Laterality Date  . ABDOMINAL HYSTERECTOMY    . BREAST SURGERY    . CATARACT EXTRACTION W/PHACO Left 05/12/2014   Procedure: CATARACT EXTRACTION PHACO AND INTRAOCULAR LENS PLACEMENT (IOC);  Surgeon: Leandrew Koyanagi, MD;  Location: [place];  Service: Ophthalmology;  Laterality: Left;  . COLONOSCOPY     a. 2010  . CORONARY STENT INTERVENTION N/A 04/26/2016   Procedure: Coronary Stent Intervention;  Surgeon: Isaias Cowman, MD;  Location: [place] [facility];  Service: Cardiovascular;  Laterality: N/A;  . LEFT HEART CATH AND CORONARY ANGIOGRAPHY N/A 04/26/2016   Procedure: Left Heart Cath and Coronary Angiography;  Surgeon: Isaias Cowman, MD;  Location: [place] [facility];  Service: Cardiovascular;  Laterality: N/A;  . LEFT HEART CATH AND CORONARY ANGIOGRAPHY N/A 05/05/2016   Procedure: Left Heart Cath and Coronary Angiography;  Surgeon: Wellington Hampshire, MD;  Location: [place] [facility];  Service: Cardiovascular;  Laterality: N/A;  . TONSILLECTOMY       Home Meds: Prior to Admission medications   Medication Sig Start Date End Date Taking? Authorizing Provider  albuterol (PROVENTIL HFA;VENTOLIN HFA) 108 (90 BASE) MCG/ACT inhaler Inhale 2 puffs into the lungs every 6 (six) hours as needed. Wheezing  or shortness of breath   Yes [provider]  ALPRAZolam (XANAX) 0.25 MG tablet Take 1 tablet (0.25 mg total) by mouth 2 (two) times daily as needed for anxiety. 04/30/16  Yes Fritzi Mandes, MD  aspirin 325 MG EC tablet Take 325 mg by mouth daily.   Yes [provider]  bisoprolol (ZEBETA) 10 MG tablet Take 1 tablet (10 mg total) by mouth  daily. 03/29/16  Yes Lillard Anes D, NP  Calcium Carbonate-Vitamin D 600-400 MG-UNIT tablet Take 2 tablets by mouth daily. Patient taking differently: Take 1 tablet by mouth 2 (two) times daily.  01/25/16  Yes Opalski, Deborah, DO  escitalopram (LEXAPRO) 10 MG tablet TAKE 1 TABLET (10 MG TOTAL) BY MOUTH DAILY. 04/27/16  Yes Opalski, Deborah, DO  fluticasone (FLONASE) 50 MCG/ACT nasal spray Place 2 sprays into the nose daily. Nasal congestion Rarely used    Yes [provider]  furosemide (LASIX) 20 MG tablet Take 1 tablet (20 mg total) by mouth daily. 05/07/16  Yes Bettey Costa, MD  isosorbide mononitrate (IMDUR) 30 MG 24 hr tablet Take 1 tablet (30 mg total) by mouth daily. 04/30/16  Yes Fritzi Mandes, MD  lisinopril (PRINIVIL,ZESTRIL) 20 MG tablet Take 1 tablet by mouth daily. 02/27/16 02/26/17 Yes [provider]  naphazoline-pheniramine (NAPHCON-A) 0.025-0.3 % ophthalmic solution Place 1 drop into both eyes 4 (four) times daily as needed for irritation.   Yes [provider]  nitroGLYCERIN (NITROSTAT) 0.3 MG SL tablet Place 0.3 mg under the tongue every 5 (five) minutes as needed for chest pain.   Yes [provider]  ticagrelor (BRILINTA) 90 MG TABS tablet Take 1 tablet (90 mg total) by mouth 2 (two) times daily. Patient not taking: Reported on 05/15/2016 04/29/16   Fritzi Mandes, MD    Inpatient Medications:  . aspirin  325 mg Oral Daily  . bisoprolol  10 mg Oral Daily  . Calcium Carbonate-Vitamin D  1 tablet Oral BID  . escitalopram  10 mg Oral Daily  . fluticasone  2 spray Each Nare Daily  . isosorbide mononitrate  30 mg Oral Daily  . lisinopril  20 mg Oral Daily   . heparin 1,150 Units/hr (05/15/16 6546)    Allergies:  Allergies  Allergen Reactions  . Fenofibrate Other (See Comments)    Myalgias/gi upset   . Statins Other (See Comments)    Myalgias   . Sulfonamide Derivatives Nausea And Vomiting    REACTION: nausea    Social History   Social  History  . Marital status: Widowed    Spouse name: N/A  . Number of children: N/A  . Years of education: N/A   Occupational History  . Retired    Social History Main Topics  . Smoking status: Former Smoker    Packs/day: 0.25    Years: 20.00    Types: Cigarettes    Quit date: 01/02/1971  . Smokeless tobacco: Never Used     Comment: smoked a few cigarettes/day x 20 yrs - quit @ age 71.  Marland Kitchen Alcohol use No  . Drug use: No  . Sexual activity: Not Currently   Other Topics Concern  . Not on file   Social History Narrative   Lives in [place] by herself.  She has family near-by.  She tries to stay active around the house - does all of her own housework and still mows (rides) her yard.      Daily caffeine      Family History  Problem Relation Age of Onset  . Colon cancer Sister   . Melanoma Sister   . Hyperlipidemia Brother   . Hypertension Mother   . Lupus Mother        died 91 from surgical complications  . Alzheimer's disease Brother        unknown  . Kidney disease Father        died 12  . Hypertension Father   . Alcohol abuse Father      Review of Systems Positive forShortness of breath Negative for: General:  chills, fever, night sweats or weight changes.  Cardiovascular: PND orthopnea syncope dizziness  Dermatological skin lesions rashes Respiratory: Cough congestion Urologic: Frequent urination urination at night and hematuria Abdominal: negative for nausea, vomiting, diarrhea, bright red blood per rectum, melena, or hematemesis Neurologic: negative for visual changes, and/or hearing changes  All other systems reviewed and are otherwise negative except as noted above.  Labs:  Recent Labs  05/15/16 0700  TROPONINI 0.10*   Lab Results  Component Value Date   WBC 11.9 (H) 05/15/2016   HGB 12.1 05/15/2016   HCT 36.8 05/15/2016   MCV 86.7 05/15/2016   PLT 255 05/15/2016    Recent Labs Lab 05/15/16 0700  NA 137  K 4.3  CL 107  CO2 21*  BUN 36*   CREATININE 1.15*  CALCIUM 10.0  PROT 6.7  BILITOT 0.7  ALKPHOS 76  ALT 10*  AST 17  GLUCOSE 106*   Lab Results  Component Value Date   CHOL 218 (H) 04/27/2016   HDL 44 04/27/2016   LDLCALC 146 (H) 04/27/2016   TRIG 141 04/27/2016   No results found for: DDIMER  Radiology/Studies:  Dg Chest 2 View  Result Date: 05/15/2016 CLINICAL DATA:  83 year old female with progressive shortness of breath since yesterday. EXAM: CHEST  2 VIEW COMPARISON:  05/06/2016 and earlier. FINDINGS: Seated AP and lateral views of the chest. Stable cardiac size and mediastinal contours. Visualized tracheal air column is within normal limits. New left lung base and medial costophrenic angle opacity resembles a subpulmonic left pleural effusion. Small volume of fluid tracking in the bilateral pleural fissures including the right minor fissure. No superimposed pneumothorax. Mild pulmonary vascular congestion without overt edema. Calcified aortic atherosclerosis. Stable visualized osseous structures. Negative visible bowel gas pattern. IMPRESSION: 1. Left lung base opacity favored due to small or moderate size left subpulmonic pleural effusion. Left lower lobe consolidation (such as due to aspiration or pneumonia) is felt less likely. 2. Small volume fluid in the bilateral pleural fissures. Mild pulmonary vascular congestion without overt edema. Electronically Signed   By: Genevie Ann M.D.   On: 05/15/2016 07:38   Ct Angio Chest Pe W Or Wo Contrast  Result Date: 05/15/2016 CLINICAL DATA:  Shortness of Breath EXAM: CT ANGIOGRAPHY CHEST WITH CONTRAST TECHNIQUE: Multidetector CT imaging of the chest was performed using the standard protocol during bolus administration of intravenous contrast. Multiplanar CT image reconstructions and MIPs were obtained to evaluate the vascular anatomy. CONTRAST:  60 mL Isovue 370 nonionic COMPARISON:  Chest radiograph May 15, 2016 FINDINGS: Cardiovascular: There are multiple right-sided  pulmonary emboli rising from the distal right main pulmonary artery with extension into multiple upper and lower lobe pulmonary artery branches. No pulmonary embolus identified to the left of midline. The right ventricle to left ventricular diameter ratio is less than 0.9, not consistent with right heart strain. There is no thoracic aortic aneurysm or dissection. There is moderate atherosclerotic calcification in  the proximal left subclavian and right common carotid arteries. There are multiple foci of atherosclerotic calcification in the aorta. There are foci of coronary artery calcification. Pericardium is not appreciably thickened. Mediastinum/Nodes: The thyroid has an inhomogeneous appearance. There is a mass arising from the left lobe of the thyroid measuring 2.2 x 1.6 cm. There are several mildly prominent mediastinal lymph nodes. To the right of the distal trachea, there is a lymph node measuring 1.5 x 1.0 cm. At the level of the aortic arch to the left, there is a lymph node measuring 1.9 x 1.1 cm. There is a lymph node in the right hilar region measuring 1.6 x 1.0 cm. There is a small hiatal hernia. Lungs/Pleura: There are moderate free-flowing pleural effusions bilaterally with consolidation in both lung bases, more pronounced on the left than on the right. There is mild interstitial edema. There is patchy atelectasis in the left upper lobe as well as in both lung bases. Upper Abdomen: In the visualized upper abdomen, there is atherosclerotic calcification in the aorta and major mesenteric branches. Visualized upper abdominal structures otherwise appear unremarkable. Musculoskeletal: There is degenerative change in the thoracic and visualized upper lumbar regions. There are no blastic or lytic bone lesions. Review of the MIP images confirms the above findings. IMPRESSION: Multiple pulmonary emboli on the right without right heart strain. Evidence a degree of congestive heart failure. Mild pulmonary edema  with cardiomegaly and moderate pleural effusions. Bibasilar consolidation, somewhat more on the left than the right. This consolidation is largely due to compressive atelectasis, although superimposed pneumonia must be of concern in these areas, particularly on the left. Several mildly enlarged lymph nodes of uncertain etiology. These lymph nodes may well have reactive etiology given the changes in the lungs. Multiple foci of atherosclerotic calcification as well as foci of coronary artery calcification. **An incidental finding of potential clinical significance has been found. Left lobe thyroid mass measuring 2.2 x 1.6 cm. Consider further evaluation with thyroid ultrasound. If patient is clinically hyperthyroid, consider nuclear medicine thyroid uptake and scan.** Critical Value/emergent results were called by telephone at the time of interpretation on 05/15/2016 at 8:36 am to Dr. Shirlyn Goltz , who verbally acknowledged these results. Electronically Signed   By: Lowella Grip III M.D.   On: 05/15/2016 08:36   Dg Chest Port 1 View  Result Date: 05/06/2016 CLINICAL DATA:  83 year old female with un stable angina. Shortness of breath and chest pain. Respiratory failure. EXAM: PORTABLE CHEST 1 VIEW COMPARISON:  05/05/2016 and earlier. FINDINGS: Portable AP upright view at 0519 hours. Stable lung volumes. Stable mediastinal contours with borderline to mild cardiomegaly. Visualized tracheal air column is within normal limits. No pneumothorax. Mildly decreased pulmonary vascularity since yesterday with no overt edema. Continued dense retrocardiac opacity. No large pleural effusion. IMPRESSION: 1. Decreased pulmonary vascularity since yesterday.  No acute edema. 2. Bilateral lower lobe collapse or consolidation. Electronically Signed   By: Genevie Ann M.D.   On: 05/06/2016 07:13   Dg Chest Port 1 View  Result Date: 05/05/2016 CLINICAL DATA:  Acute onset shortness of breath. History of hypertension, CHF. EXAM: PORTABLE  CHEST 1 VIEW COMPARISON:  Chest radiograph March 29, 2016 FINDINGS: The cardiac silhouette is mildly enlarged and unchanged. Mild chronic interstitial changes without focal consolidation. Trace LEFT pleural effusion. No pneumothorax. Osteopenia. Soft tissue planes are nonsuspicious. Mildly calcified aortic knob. IMPRESSION: Stable examination: Mild cardiomegaly, chronic interstitial changes with trace LEFT pleural effusion. Electronically Signed   By: Elon Alas  M.D.   On: 05/05/2016 02:07   Dg Chest Port 1 View  Result Date: 04/29/2016 CLINICAL DATA:  Shortness of breath and chest pain EXAM: PORTABLE CHEST 1 VIEW COMPARISON:  April 26, 2016 FINDINGS: There are rather minimal pleural effusions bilaterally. There is no edema or consolidation. Heart is mildly enlarged. There is no evident adenopathy. There is aortic atherosclerosis. No bone lesions. IMPRESSION: Mild cardiomegaly with rather minimal pleural effusions bilaterally. No edema or consolidation. There is aortic atherosclerosis. Electronically Signed   By: Lowella Grip III M.D.   On: 04/29/2016 11:35   Dg Chest Port 1 View  Result Date: 04/26/2016 CLINICAL DATA:  Chest pain EXAM: PORTABLE CHEST 1 VIEW COMPARISON:  09/23/2011 FINDINGS: The heart size and mediastinal contours are within normal limits. Both lungs are clear. The visualized skeletal structures are unremarkable. IMPRESSION: No active disease. Electronically Signed   By: Inez Catalina M.D.   On: 04/26/2016 10:32    EKG: Normal sinus rhythm with first-degree AV block and left bundle branch block  Weights: Filed Weights   05/15/16 0654  Weight: 73 kg (161 lb)     Physical Exam: Blood pressure 106/90, pulse 76, temperature 97.9 F (36.6 C), temperature source Oral, resp. rate 18, height 5\' 2"  (1.575 m), weight 73 kg (161 lb), SpO2 96 %. Body mass index is 29.45 kg/m. General: Well developed, well nourished, in no acute distress. Head eyes ears nose throat:  Normocephalic, atraumatic, sclera non-icteric, no xanthomas, nares are without discharge. No apparent thyromegaly and/or mass  Lungs: Normal respiratory effort.  no wheezes, no rales, no rhonchi.  Heart: RRR with normal S1 S2. no murmur gallop, no rub, PMI is normal size and placement, carotid upstroke normal without bruit, jugular venous pressure is normal Abdomen: Soft, non-tender, non-distended with normoactive bowel sounds. No hepatomegaly. No rebound/guarding. No obvious abdominal masses. Abdominal aorta is normal size without bruit Extremities: No edema. no cyanosis, no clubbing, no ulcers  Peripheral : 2+ bilateral upper extremity pulses, 2+ bilateral femoral pulses, 2+ bilateral dorsal pedal pulse Neuro: Alert and oriented. No facial asymmetry. No focal deficit. Moves all extremities spontaneously. Musculoskeletal: Normal muscle tone without kyphosis Psych:  Responds to questions appropriately with a normal affect.    Assessment: 83 year old female with known coronary artery disease status post PCI and stent placement and ST elevation myocardial infarction of the left anterior descending artery previously appropriate medication management now with acute pulmonary embolism with hypoxia and heart failure improving at this time on appropriate therapy  Plan: 1. Heparin for further risk reduction of pulmonary embolism propagation and no change over to oral anticoagulation as able 2. Single antiplatelet therapy for coronary artery disease and recent myocardial infarction due to concerns of bleeding complications with triple therapy in an elderly female 3. Continuation of ACE inhibitor beta blocker for further risk reduction myocardial infarction and recover from previous myocardial infarction 4. Isosorbide for future and current evidence of chest pain 5. Further cardiac diagnostics necessary at this time  Signed, Corey Skains M.D. [facility] 05/15/2016, 12:34 PM

## 2016-05-15 NOTE — Progress Notes (Addendum)
ANTICOAGULATION CONSULT NOTE - Initial Consult  Pharmacy Consult for Heparin Drip Indication: pulmonary embolus  Allergies  Allergen Reactions  . Fenofibrate Other (See Comments)    Myalgias/gi upset   . Statins Other (See Comments)    Myalgias   . Sulfonamide Derivatives Nausea And Vomiting    REACTION: nausea    Patient Measurements: Height: 5\' 2"  (157.5 cm) Weight: 161 lb (73 kg) IBW/kg (Calculated) : 50.1 Heparin Dosing Weight: 65.74 kg  Vital Signs: Temp: 97.9 F (36.6 C) (05/15 1217) Temp Source: Oral (05/15 1217) BP: 94/47 (05/15 1407) Pulse Rate: 73 (05/15 1407)  Labs:  Recent Labs  05/15/16 0700 05/15/16 1325 05/15/16 1740  HGB 12.1  --   --   HCT 36.8  --   --   PLT 255  --   --   APTT 28  --   --   LABPROT 13.0  --   --   INR 0.98  --   --   HEPARINUNFRC  --   --  0.69  CREATININE 1.15*  --   --   TROPONINI 0.10* 0.09* 0.09*    Estimated Creatinine Clearance: 29.8 mL/min (A) (by C-G formula based on SCr of 1.15 mg/dL (H)).   Medical History: Past Medical History:  Diagnosis Date  . Allergic rhinitis   . Anxiety   . Arthritis   . Atypical chest pain   . Bronchitis 01/14/2016  . Chronic diastolic CHF (congestive heart failure) (HCC)    Echo 06/2010 EF 60-65%, no RWMAs, grade 1 diastolic dysfunction, mild LAE, PASP 73mmHg.  Marland Kitchen DIVERTICULOSIS, COLON   . HEMORRHOIDS, INTERNAL   . Hepatic steatosis   . Hyperlipidemia   . HYPERTENSION   . LBBB (left bundle branch block)    a. present on ECG 06/2010  . Osteopenia   . Pre-diabetes    A1c 6.0    Medications:  Scheduled:  . aspirin  325 mg Oral Daily  . bisoprolol  10 mg Oral Daily  . calcium-vitamin D  1 tablet Oral BID  . escitalopram  10 mg Oral Daily  . [START ON 05/16/2016] fluticasone  2 spray Each Nare Daily  . isosorbide mononitrate  30 mg Oral Daily  . lisinopril  20 mg Oral Daily   Infusions:  . heparin 1,150 Units/hr (05/15/16 0924)    Assessment: Heparin drip ordered for  PE in this 83 yo female presenting with shortness of breath.   Goal of Therapy:  Heparin level 0.3-0.7 units/ml Monitor platelets by anticoagulation protocol: Yes   Plan:  Give 4000 units bolus x 1 Start heparin infusion at 1150 units/hr Check anti-Xa level in 8 hours and daily while on heparin Continue to monitor H&H and platelets   HL ordered for tonight at 17:30.  5/15 1740 HL therapeutic x 1. Continue current rate. Will recheck HL in 8 hours.  5/16 02:00 heparin level 0.75. Decreasing to 1050 units/hr. Recheck in 8 hours.  Laural Benes, Pharm.D., BCPS Clinical Pharmacist 05/15/2016,7:02 PM

## 2016-05-15 NOTE — H&P (Signed)
[facility] NAME: Wanda Davenport    MR#:  [id number]  DATE OF BIRTH:  04-13-1923  DATE OF ADMISSION:  05/15/2016  PRIMARY CARE PHYSICIAN: Mellody Dance, DO   REQUESTING/REFERRING PHYSICIAN: Dr. Darl Householder  Shortness of breath since Sunday, more today  HISTORY OF PRESENT ILLNESS:  Wanda Davenport  is a 83 y.o. female with a known history of ST EMI status post drug-eluting stent in LAD in April 3846, diastolic congestive heart failure, hypertension, known history of left bundle-branch block on EKG comes to the emergency room a cup in by daughter from home with increasing shortness of breath with started past Sunday more so today.   patient in the emergency room underwent workup and CT of the chest was done which shows multiple emboli on the right. She is on heparin drip. According to the daughter she has not picked up her brilinta prescription since it was expensive and did not get in touch with the cardiologist and hence patient has been off Loda.  Patient is being admitted for acute hypoxic respiratory failure secondary to right sided PE.  PAST MEDICAL HISTORY:   Past Medical History:  Diagnosis Date  . Allergic rhinitis   . Anxiety   . Arthritis   . Atypical chest pain   . Bronchitis 01/14/2016  . Chronic diastolic CHF (congestive heart failure) (HCC)    Echo 06/2010 EF 60-65%, no RWMAs, grade 1 diastolic dysfunction, mild LAE, PASP 26mmHg.  Marland Kitchen DIVERTICULOSIS, COLON   . HEMORRHOIDS, INTERNAL   . Hepatic steatosis   . Hyperlipidemia   . HYPERTENSION   . LBBB (left bundle branch block)    a. present on ECG 06/2010  . Osteopenia   . Pre-diabetes    A1c 6.0    PAST SURGICAL HISTOIRY:   Past Surgical History:  Procedure Laterality Date  . ABDOMINAL HYSTERECTOMY    . BREAST SURGERY    . CATARACT EXTRACTION W/PHACO Left 05/12/2014   Procedure: CATARACT EXTRACTION PHACO AND INTRAOCULAR LENS PLACEMENT (IOC);  Surgeon:  Leandrew Koyanagi, MD;  Location: [facility];  Service: Ophthalmology;  Laterality: Left;  . COLONOSCOPY     a. 2010  . CORONARY STENT INTERVENTION N/A 04/26/2016   Procedure: Coronary Stent Intervention;  Surgeon: Isaias Cowman, MD;  Location: [place] [facility];  Service: Cardiovascular;  Laterality: N/A;  . LEFT HEART CATH AND CORONARY ANGIOGRAPHY N/A 04/26/2016   Procedure: Left Heart Cath and Coronary Angiography;  Surgeon: Isaias Cowman, MD;  Location: [place] [facility];  Service: Cardiovascular;  Laterality: N/A;  . LEFT HEART CATH AND CORONARY ANGIOGRAPHY N/A 05/05/2016   Procedure: Left Heart Cath and Coronary Angiography;  Surgeon: Wellington Hampshire, MD;  Location: [place] [facility];  Service: Cardiovascular;  Laterality: N/A;  . TONSILLECTOMY      SOCIAL HISTORY:   Social History  Substance Use Topics  . Smoking status: Former Smoker    Packs/day: 0.25    Years: 20.00    Types: Cigarettes    Quit date: 01/02/1971  . Smokeless tobacco: Never Used     Comment: smoked a few cigarettes/day x 20 yrs - quit @ age 63.  Marland Kitchen Alcohol use No    FAMILY HISTORY:   Family History  Problem Relation Age of Onset  . Colon cancer Sister   . Melanoma Sister   . Hyperlipidemia Brother   . Hypertension Mother   . Lupus Mother  died 52 from surgical complications  . Alzheimer's disease Brother        unknown  . Kidney disease Father        died 74  . Hypertension Father   . Alcohol abuse Father     DRUG ALLERGIES:   Allergies  Allergen Reactions  . Fenofibrate Other (See Comments)    Myalgias/gi upset   . Statins Other (See Comments)    Myalgias   . Sulfonamide Derivatives Nausea And Vomiting    REACTION: nausea    REVIEW OF SYSTEMS:  Review of Systems  Constitutional: Negative for chills, fever and weight loss.  HENT: Negative for ear discharge, ear pain and nosebleeds.   Eyes: Negative for blurred vision, pain and discharge.   Respiratory: Positive for shortness of breath. Negative for sputum production, wheezing and stridor.   Cardiovascular: Positive for chest pain and PND. Negative for palpitations and orthopnea.  Gastrointestinal: Negative for abdominal pain, diarrhea, nausea and vomiting.  Genitourinary: Negative for frequency and urgency.  Musculoskeletal: Negative for back pain and joint pain.  Neurological: Positive for weakness. Negative for sensory change, speech change and focal weakness.  Psychiatric/Behavioral: Negative for depression and hallucinations. The patient is not nervous/anxious.      MEDICATIONS AT HOME:   Prior to Admission medications   Medication Sig Start Date End Date Taking? Authorizing Provider  albuterol (PROVENTIL HFA;VENTOLIN HFA) 108 (90 BASE) MCG/ACT inhaler Inhale 2 puffs into the lungs every 6 (six) hours as needed. Wheezing or shortness of breath   Yes [provider]  ALPRAZolam (XANAX) 0.25 MG tablet Take 1 tablet (0.25 mg total) by mouth 2 (two) times daily as needed for anxiety. 04/30/16  Yes Fritzi Mandes, MD  aspirin 325 MG EC tablet Take 325 mg by mouth daily.   Yes [provider]  bisoprolol (ZEBETA) 10 MG tablet Take 1 tablet (10 mg total) by mouth daily. 03/29/16  Yes Lillard Anes D, NP  Calcium Carbonate-Vitamin D 600-400 MG-UNIT tablet Take 2 tablets by mouth daily. Patient taking differently: Take 1 tablet by mouth 2 (two) times daily.  01/25/16  Yes Opalski, Deborah, DO  escitalopram (LEXAPRO) 10 MG tablet TAKE 1 TABLET (10 MG TOTAL) BY MOUTH DAILY. 04/27/16  Yes Opalski, Deborah, DO  fluticasone (FLONASE) 50 MCG/ACT nasal spray Place 2 sprays into the nose daily. Nasal congestion Rarely used    Yes [provider]  furosemide (LASIX) 20 MG tablet Take 1 tablet (20 mg total) by mouth daily. 05/07/16  Yes Bettey Costa, MD  isosorbide mononitrate (IMDUR) 30 MG 24 hr tablet Take 1 tablet (30 mg total) by mouth daily. 04/30/16  Yes Fritzi Mandes, MD   lisinopril (PRINIVIL,ZESTRIL) 20 MG tablet Take 1 tablet by mouth daily. 02/27/16 02/26/17 Yes [provider]  naphazoline-pheniramine (NAPHCON-A) 0.025-0.3 % ophthalmic solution Place 1 drop into both eyes 4 (four) times daily as needed for irritation.   Yes [provider]  nitroGLYCERIN (NITROSTAT) 0.3 MG SL tablet Place 0.3 mg under the tongue every 5 (five) minutes as needed for chest pain.   Yes [provider]  ticagrelor (BRILINTA) 90 MG TABS tablet Take 1 tablet (90 mg total) by mouth 2 (two) times daily. Patient not taking: Reported on 05/15/2016 04/29/16   Fritzi Mandes, MD      VITAL SIGNS:  Blood pressure (!) 108/54, pulse 70, temperature 97.8 F (36.6 C), temperature source Oral, resp. rate 15, height 5\' 2"  (1.575 m), weight 73 kg (161 lb), SpO2 97 %.  PHYSICAL EXAMINATION:  GENERAL:  83 y.o.-year-old patient lying in the bed with no acute distress.  EYES: Pupils equal, round, reactive to light and accommodation. No scleral icterus. Extraocular muscles intact.  HEENT: Head atraumatic, normocephalic. Oropharynx and nasopharynx clear.  NECK:  Supple, no jugular venous distention. No thyroid enlargement, no tenderness.  LUNGS: Normal breath sounds bilaterally, no wheezing, rales,rhonchi or crepitation. No use of accessory muscles of respiration.  CARDIOVASCULAR: S1, S2 normal. No murmurs, rubs, or gallops. Tachycardia+ ABDOMEN: Soft, nontender, nondistended. Bowel sounds present. No organomegaly or mass.  EXTREMITIES: No pedal edema, cyanosis, or clubbing.  NEUROLOGIC: Cranial nerves II through XII are intact. Muscle strength 5/5 in all extremities. Sensation intact. Gait not checked.  PSYCHIATRIC: patient is alert and oriented x 3.  SKIN: No obvious rash, lesion, or ulcer.   LABORATORY PANEL:   CBC  Recent Labs Lab 05/15/16 0700  WBC 11.9*  HGB 12.1  HCT 36.8  PLT 255    ------------------------------------------------------------------------------------------------------------------  Chemistries   Recent Labs Lab 05/15/16 0700  NA 137  K 4.3  CL 107  CO2 21*  GLUCOSE 106*  BUN 36*  CREATININE 1.15*  CALCIUM 10.0  AST 17  ALT 10*  ALKPHOS 76  BILITOT 0.7   ------------------------------------------------------------------------------------------------------------------  Cardiac Enzymes  Recent Labs Lab 05/15/16 0700  TROPONINI 0.10*   ------------------------------------------------------------------------------------------------------------------  RADIOLOGY:  Dg Chest 2 View  Result Date: 05/15/2016 CLINICAL DATA:  83 year old female with progressive shortness of breath since yesterday. EXAM: CHEST  2 VIEW COMPARISON:  05/06/2016 and earlier. FINDINGS: Seated AP and lateral views of the chest. Stable cardiac size and mediastinal contours. Visualized tracheal air column is within normal limits. New left lung base and medial costophrenic angle opacity resembles a subpulmonic left pleural effusion. Small volume of fluid tracking in the bilateral pleural fissures including the right minor fissure. No superimposed pneumothorax. Mild pulmonary vascular congestion without overt edema. Calcified aortic atherosclerosis. Stable visualized osseous structures. Negative visible bowel gas pattern. IMPRESSION: 1. Left lung base opacity favored due to small or moderate size left subpulmonic pleural effusion. Left lower lobe consolidation (such as due to aspiration or pneumonia) is felt less likely. 2. Small volume fluid in the bilateral pleural fissures. Mild pulmonary vascular congestion without overt edema. Electronically Signed   By: Genevie Ann M.D.   On: 05/15/2016 07:38   Ct Angio Chest Pe W Or Wo Contrast  Result Date: 05/15/2016 CLINICAL DATA:  Shortness of Breath EXAM: CT ANGIOGRAPHY CHEST WITH CONTRAST TECHNIQUE: Multidetector CT imaging of the chest  was performed using the standard protocol during bolus administration of intravenous contrast. Multiplanar CT image reconstructions and MIPs were obtained to evaluate the vascular anatomy. CONTRAST:  60 mL Isovue 370 nonionic COMPARISON:  Chest radiograph May 15, 2016 FINDINGS: Cardiovascular: There are multiple right-sided pulmonary emboli rising from the distal right main pulmonary artery with extension into multiple upper and lower lobe pulmonary artery branches. No pulmonary embolus identified to the left of midline. The right ventricle to left ventricular diameter ratio is less than 0.9, not consistent with right heart strain. There is no thoracic aortic aneurysm or dissection. There is moderate atherosclerotic calcification in the proximal left subclavian and right common carotid arteries. There are multiple foci of atherosclerotic calcification in the aorta. There are foci of coronary artery calcification. Pericardium is not appreciably thickened. Mediastinum/Nodes: The thyroid has an inhomogeneous appearance. There is a mass arising from the left lobe of the thyroid measuring 2.2 x 1.6 cm. There are several  mildly prominent mediastinal lymph nodes. To the right of the distal trachea, there is a lymph node measuring 1.5 x 1.0 cm. At the level of the aortic arch to the left, there is a lymph node measuring 1.9 x 1.1 cm. There is a lymph node in the right hilar region measuring 1.6 x 1.0 cm. There is a small hiatal hernia. Lungs/Pleura: There are moderate free-flowing pleural effusions bilaterally with consolidation in both lung bases, more pronounced on the left than on the right. There is mild interstitial edema. There is patchy atelectasis in the left upper lobe as well as in both lung bases. Upper Abdomen: In the visualized upper abdomen, there is atherosclerotic calcification in the aorta and major mesenteric branches. Visualized upper abdominal structures otherwise appear unremarkable. Musculoskeletal:  There is degenerative change in the thoracic and visualized upper lumbar regions. There are no blastic or lytic bone lesions. Review of the MIP images confirms the above findings. IMPRESSION: Multiple pulmonary emboli on the right without right heart strain. Evidence a degree of congestive heart failure. Mild pulmonary edema with cardiomegaly and moderate pleural effusions. Bibasilar consolidation, somewhat more on the left than the right. This consolidation is largely due to compressive atelectasis, although superimposed pneumonia must be of concern in these areas, particularly on the left. Several mildly enlarged lymph nodes of uncertain etiology. These lymph nodes may well have reactive etiology given the changes in the lungs. Multiple foci of atherosclerotic calcification as well as foci of coronary artery calcification. **An incidental finding of potential clinical significance has been found. Left lobe thyroid mass measuring 2.2 x 1.6 cm. Consider further evaluation with thyroid ultrasound. If patient is clinically hyperthyroid, consider nuclear medicine thyroid uptake and scan.** Critical Value/emergent results were called by telephone at the time of interpretation on 05/15/2016 at 8:36 am to Dr. Shirlyn Goltz , who verbally acknowledged these results. Electronically Signed   By: Lowella Grip III M.D.   On: 05/15/2016 08:36    EKG:   Atrial fibrillation/left bundle branch block IMPRESSION AND PLAN:   Wanda Davenport  is a 83 y.o. female with a known history of ST EMI status post drug-eluting stent in LAD in April 2330, diastolic congestive heart failure, hypertension, known history of left bundle-branch block on EKG comes to the emergency room a cup in by daughter from home with increasing shortness of breath with started past Sunday more so today. Patient in the emergency room underwent workup and CT of the chest was done which shows multiple emboli on the right.  1.Acute respiratory distress  secondary to multiple emboli on the right/PE  -Admit to telemetry  -IV heparin drip. We'll transition to oral anticoagulation tomorrow.  -Oxygen as needed  -Ultrasound Doppler lower extremity   2. Coronary artery disease status post stent recent LAD -Continue aspirin and other cardiac meds -Cardiac consultation with Dr. Nehemiah Massed. -Patient since discharge has not been able to pick up prescription for brilinta. Will hold off on it.  3. Hypertension continue home meds  4. Thyroid mass incidentally noted on CT of the chest -We'll get ultrasound of the thyroid  5. DVT prophylaxis already on heparin drip  6. PT Lavena Bullion worker/Management consult   All the records are reviewed and case discussed with ED provider. Management plans discussed with the patient, family and they are in agreement.  CODE STATUS: DO NOT RESUSCITATE. This was discussed with patient's daughter and patient in the ER  TOTAL TIME TAKING CARE OF THIS PATIENT: 55 minutes.  Modell Fendrick M.D on 05/15/2016 at 12:08 PM  Between 7am to 6pm - Pager - [phone]  After 6pm go to [email or web address] - password [email or web address] [facility]  [facility]  Office  [phone]  CC: Primary care physician; Mellody Dance, DO

## 2016-05-15 NOTE — Progress Notes (Signed)
Clinical Education officer, museum (CSW) received a consult for D/C planning. Patient was admitted from home and was recently at [facility]. PT is pending. CSW will continue to follow and assist as needed.   [employer], LCSW [phone]

## 2016-05-15 NOTE — ED Triage Notes (Addendum)
Pt to ED from home with SOB began yesterday, increasing this am 0400. Pt states yesterday noticed increased WOB with exertion, this morning was woke from sleep feeling as if she couldn't breathe. Lungs clear. Pt on room air. Daughter at bedside. Pt reports sinus congestion in past week.

## 2016-05-15 NOTE — Progress Notes (Signed)
Patient admitted to unit. Oriented to room, call bell, and staff. Bed in lowest position. Fall safety plan reviewed. Full assessment to Epic. Skin assessment verified with Vicenta Dunning RN. Telemetry box verification with tele clerk and Natalie NT- Box#: [id number]. Will continue to monitor.

## 2016-05-16 LAB — CBC
HCT: 34.9 % — ABNORMAL LOW (ref 35.0–47.0)
Hemoglobin: 11.7 g/dL — ABNORMAL LOW (ref 12.0–16.0)
MCH: 29.1 pg (ref 26.0–34.0)
MCHC: 33.6 g/dL (ref 32.0–36.0)
MCV: 86.7 fL (ref 80.0–100.0)
PLATELETS: 215 10*3/uL (ref 150–440)
RBC: 4.02 MIL/uL (ref 3.80–5.20)
RDW: 15.6 % — AB (ref 11.5–14.5)
WBC: 7.7 10*3/uL (ref 3.6–11.0)

## 2016-05-16 LAB — HEPARIN LEVEL (UNFRACTIONATED)
Heparin Unfractionated: 0.75 IU/mL — ABNORMAL HIGH (ref 0.30–0.70)
Heparin Unfractionated: 0.7 IU/mL (ref 0.30–0.70)
Heparin Unfractionated: 0.61 IU/mL (ref 0.30–0.70)

## 2016-05-16 LAB — TROPONIN I: TROPONIN I: 0.09 ng/mL — AB (ref <0.03)

## 2016-05-16 MED ORDER — APIXABAN 5 MG PO TABS
10.0000 mg | ORAL_TABLET | Freq: Two times a day (BID) | ORAL | Status: DC
  Administered 2016-05-16 – 2016-05-17 (×2): 10 mg via ORAL
  Filled 2016-05-16 (×2): qty 2

## 2016-05-16 MED ORDER — APIXABAN 5 MG PO TABS: 5.0000 mg | ORAL_TABLET | Freq: Two times a day (BID) | ORAL | Status: DC

## 2016-05-16 NOTE — Progress Notes (Signed)
PT Cancellation Note  Patient Details Name: Wanda Davenport MRN: [id number] DOB: 1923-12-24   Cancelled Treatment:    Reason Eval/Treat Not Completed: Medical issues which prohibited therapy.  CT angio of chest showing multiple pulmonary emboli on R.  Per PT policy for pulmonary emboli, will hold PT for 48 hours from the time and date of administration of anticoagulant medication (05/15/16 at 0924 IV heparin drip started).  Will re-attempt PT eval on 05/17/16 after 0924.  Leitha Bleak, PT 05/16/16, 10:17 AM [phone]

## 2016-05-16 NOTE — Progress Notes (Signed)
ANTICOAGULATION CONSULT NOTE - Initial Consult  Pharmacy Consult for Heparin Drip Indication: pulmonary embolus  Allergies  Allergen Reactions  . Fenofibrate Other (See Comments)    Myalgias/gi upset   . Statins Other (See Comments)    Myalgias   . Sulfonamide Derivatives Nausea And Vomiting    REACTION: nausea    Patient Measurements: Height: 5\' 2"  (157.5 cm) Weight: 161 lb (73 kg) IBW/kg (Calculated) : 50.1 Heparin Dosing Weight: 65.74 kg  Vital Signs: Temp: 98.2 F (36.8 C) (05/16 1006) Temp Source: Oral (05/16 1006) BP: 101/49 (05/16 1248) Pulse Rate: 68 (05/16 1248)  Labs:  Recent Labs  05/15/16 0700 05/15/16 1325 05/15/16 1740 05/16/16 0054 05/16/16 0158 05/16/16 1125  HGB 12.1  --   --   --  11.7*  --   HCT 36.8  --   --   --  34.9*  --   PLT 255  --   --   --  215  --   APTT 28  --   --   --   --   --   LABPROT 13.0  --   --   --   --   --   INR 0.98  --   --   --   --   --   HEPARINUNFRC  --   --  0.69  --  0.75* 0.70  CREATININE 1.15*  --   --   --   --   --   TROPONINI 0.10* 0.09* 0.09* 0.09*  --   --     Estimated Creatinine Clearance: 29.8 mL/min (A) (by C-G formula based on SCr of 1.15 mg/dL (H)).   Medical History: Past Medical History:  Diagnosis Date  . Allergic rhinitis   . Anxiety   . Arthritis   . Atypical chest pain   . Bronchitis 01/14/2016  . Chronic diastolic CHF (congestive heart failure) (HCC)    Echo 06/2010 EF 60-65%, no RWMAs, grade 1 diastolic dysfunction, mild LAE, PASP 47mmHg.  Marland Kitchen DIVERTICULOSIS, COLON   . HEMORRHOIDS, INTERNAL   . Hepatic steatosis   . Hyperlipidemia   . HYPERTENSION   . LBBB (left bundle branch block)    a. present on ECG 06/2010  . Osteopenia   . Pre-diabetes    A1c 6.0    Medications:  Scheduled:  . aspirin  325 mg Oral Daily  . bisoprolol  10 mg Oral Daily  . calcium-vitamin D  1 tablet Oral BID  . escitalopram  10 mg Oral Daily  . fluticasone  2 spray Each Nare Daily  . isosorbide  mononitrate  30 mg Oral Daily  . lisinopril  20 mg Oral Daily   Infusions:  . heparin 1,050 Units/hr (05/16/16 0602)    Assessment: Heparin drip ordered for PE in this 83 yo female presenting with shortness of breath.   Goal of Therapy:  Heparin level 0.3-0.7 units/ml Monitor platelets by anticoagulation protocol: Yes   Plan:  Give 4000 units bolus x 1 Start heparin infusion at 1150 units/hr Check anti-Xa level in 8 hours and daily while on heparin Continue to monitor H&H and platelets   HL ordered for tonight at 17:30.  5/15 1740 HL therapeutic x 1. Continue current rate. Will recheck HL in 8 hours.  5/16 02:00 heparin level 0.75. Decreasing to 1050 units/hr. Recheck in 8 hours.  5/16: Heparin level @ 11:25 resulted @ 0.70. Will recheck in 8 hours.   Aarush Stukey D, Pharm.D., BCPS Clinical Pharmacist 05/16/2016,12:57  PM

## 2016-05-16 NOTE — Progress Notes (Signed)
[place] [facility] [employer] NAME: Wanda Davenport    MR#:  [id number]  DATE OF BIRTH:  Nov 26, 1923  SUBJECTIVE:  denies any complaints. dter in the room  REVIEW OF SYSTEMS:   Review of Systems  Constitutional: Negative for chills, fever and weight loss.  HENT: Negative for ear discharge, ear pain and nosebleeds.   Eyes: Negative for blurred vision, pain and discharge.  Respiratory: Negative for sputum production, shortness of breath, wheezing and stridor.   Cardiovascular: Negative for chest pain, palpitations, orthopnea and PND.  Gastrointestinal: Negative for abdominal pain, diarrhea, nausea and vomiting.  Genitourinary: Negative for frequency and urgency.  Musculoskeletal: Positive for joint pain. Negative for back pain.  Neurological: Positive for weakness. Negative for sensory change, speech change and focal weakness.  Psychiatric/Behavioral: Negative for depression and hallucinations. The patient is not nervous/anxious.    Tolerating Diet:yes Tolerating PT: pending  DRUG ALLERGIES:   Allergies  Allergen Reactions  . Fenofibrate Other (See Comments)    Myalgias/gi upset   . Statins Other (See Comments)    Myalgias   . Sulfonamide Derivatives Nausea And Vomiting    REACTION: nausea    VITALS:  Blood pressure (!) 101/49, pulse 68, temperature 98.2 F (36.8 C), temperature source Oral, resp. rate 12, height 5\' 2"  (1.575 m), weight 73 kg (161 lb), SpO2 96 %.  PHYSICAL EXAMINATION:   Physical Exam  GENERAL:  83 y.o.-year-old patient lying in the bed with no acute distress.  EYES: Pupils equal, round, reactive to light and accommodation. No scleral icterus. Extraocular muscles intact.  HEENT: Head atraumatic, normocephalic. Oropharynx and nasopharynx clear.  NECK:  Supple, no jugular venous distention. No thyroid enlargement, no tenderness.  LUNGS: Normal breath sounds bilaterally, no wheezing, rales, rhonchi. No use of  accessory muscles of respiration.  CARDIOVASCULAR: S1, S2 normal. No murmurs, rubs, or gallops.  ABDOMEN: Soft, nontender, nondistended. Bowel sounds present. No organomegaly or mass.  EXTREMITIES: No cyanosis, clubbing or edema b/l.    NEUROLOGIC: Cranial nerves II through XII are intact. No focal Motor or sensory deficits b/l.   PSYCHIATRIC:  patient is alert and oriented x 3.  SKIN: No obvious rash, lesion, or ulcer.   LABORATORY PANEL:  CBC  Recent Labs Lab 05/16/16 0158  WBC 7.7  HGB 11.7*  HCT 34.9*  PLT 215    Chemistries   Recent Labs Lab 05/15/16 0700  NA 137  K 4.3  CL 107  CO2 21*  GLUCOSE 106*  BUN 36*  CREATININE 1.15*  CALCIUM 10.0  AST 17  ALT 10*  ALKPHOS 76  BILITOT 0.7   Cardiac Enzymes  Recent Labs Lab 05/16/16 0054  TROPONINI 0.09*   RADIOLOGY:  Dg Chest 2 View  Result Date: 05/15/2016 CLINICAL DATA:  83 year old female with progressive shortness of breath since yesterday. EXAM: CHEST  2 VIEW COMPARISON:  05/06/2016 and earlier. FINDINGS: Seated AP and lateral views of the chest. Stable cardiac size and mediastinal contours. Visualized tracheal air column is within normal limits. New left lung base and medial costophrenic angle opacity resembles a subpulmonic left pleural effusion. Small volume of fluid tracking in the bilateral pleural fissures including the right minor fissure. No superimposed pneumothorax. Mild pulmonary vascular congestion without overt edema. Calcified aortic atherosclerosis. Stable visualized osseous structures. Negative visible bowel gas pattern. IMPRESSION: 1. Left lung base opacity favored due to small or moderate size left subpulmonic pleural effusion. Left lower lobe consolidation (such as due to  aspiration or pneumonia) is felt less likely. 2. Small volume fluid in the bilateral pleural fissures. Mild pulmonary vascular congestion without overt edema. Electronically Signed   By: Genevie Ann M.D.   On: 05/15/2016 07:38   Ct  Angio Chest Pe W Or Wo Contrast  Result Date: 05/15/2016 CLINICAL DATA:  Shortness of Breath EXAM: CT ANGIOGRAPHY CHEST WITH CONTRAST TECHNIQUE: Multidetector CT imaging of the chest was performed using the standard protocol during bolus administration of intravenous contrast. Multiplanar CT image reconstructions and MIPs were obtained to evaluate the vascular anatomy. CONTRAST:  60 mL Isovue 370 nonionic COMPARISON:  Chest radiograph May 15, 2016 FINDINGS: Cardiovascular: There are multiple right-sided pulmonary emboli rising from the distal right main pulmonary artery with extension into multiple upper and lower lobe pulmonary artery branches. No pulmonary embolus identified to the left of midline. The right ventricle to left ventricular diameter ratio is less than 0.9, not consistent with right heart strain. There is no thoracic aortic aneurysm or dissection. There is moderate atherosclerotic calcification in the proximal left subclavian and right common carotid arteries. There are multiple foci of atherosclerotic calcification in the aorta. There are foci of coronary artery calcification. Pericardium is not appreciably thickened. Mediastinum/Nodes: The thyroid has an inhomogeneous appearance. There is a mass arising from the left lobe of the thyroid measuring 2.2 x 1.6 cm. There are several mildly prominent mediastinal lymph nodes. To the right of the distal trachea, there is a lymph node measuring 1.5 x 1.0 cm. At the level of the aortic arch to the left, there is a lymph node measuring 1.9 x 1.1 cm. There is a lymph node in the right hilar region measuring 1.6 x 1.0 cm. There is a small hiatal hernia. Lungs/Pleura: There are moderate free-flowing pleural effusions bilaterally with consolidation in both lung bases, more pronounced on the left than on the right. There is mild interstitial edema. There is patchy atelectasis in the left upper lobe as well as in both lung bases. Upper Abdomen: In the visualized  upper abdomen, there is atherosclerotic calcification in the aorta and major mesenteric branches. Visualized upper abdominal structures otherwise appear unremarkable. Musculoskeletal: There is degenerative change in the thoracic and visualized upper lumbar regions. There are no blastic or lytic bone lesions. Review of the MIP images confirms the above findings. IMPRESSION: Multiple pulmonary emboli on the right without right heart strain. Evidence a degree of congestive heart failure. Mild pulmonary edema with cardiomegaly and moderate pleural effusions. Bibasilar consolidation, somewhat more on the left than the right. This consolidation is largely due to compressive atelectasis, although superimposed pneumonia must be of concern in these areas, particularly on the left. Several mildly enlarged lymph nodes of uncertain etiology. These lymph nodes may well have reactive etiology given the changes in the lungs. Multiple foci of atherosclerotic calcification as well as foci of coronary artery calcification. **An incidental finding of potential clinical significance has been found. Left lobe thyroid mass measuring 2.2 x 1.6 cm. Consider further evaluation with thyroid ultrasound. If patient is clinically hyperthyroid, consider nuclear medicine thyroid uptake and scan.** Critical Value/emergent results were called by telephone at the time of interpretation on 05/15/2016 at 8:36 am to Dr. Shirlyn Goltz , who verbally acknowledged these results. Electronically Signed   By: Lowella Grip III M.D.   On: 05/15/2016 08:36   US Venous Img Lower Bilateral  Result Date: 05/15/2016 CLINICAL DATA:  84 year old female with new diagnosis of PE. Evaluate for residual DVT. EXAM: BILATERAL LOWER  EXTREMITY VENOUS DOPPLER ULTRASOUND TECHNIQUE: Gray-scale sonography with graded compression, as well as color Doppler and duplex ultrasound were performed to evaluate the lower extremity deep venous systems from the level of the common  femoral vein and including the common femoral, femoral, profunda femoral, popliteal and calf veins including the posterior tibial, peroneal and gastrocnemius veins when visible. The superficial great saphenous vein was also interrogated. Spectral Doppler was utilized to evaluate flow at rest and with distal augmentation maneuvers in the common femoral, femoral and popliteal veins. COMPARISON:  None. FINDINGS: RIGHT LOWER EXTREMITY Common Femoral Vein: No evidence of thrombus. Normal compressibility, respiratory phasicity and response to augmentation. Saphenofemoral Junction: No evidence of thrombus. Normal compressibility and flow on color Doppler imaging. Profunda Femoral Vein: No evidence of thrombus. Normal compressibility and flow on color Doppler imaging. Femoral Vein: No evidence of thrombus. Normal compressibility, respiratory phasicity and response to augmentation. Popliteal Vein: No evidence of thrombus. Normal compressibility, respiratory phasicity and response to augmentation. Calf Veins: Noncompressible peroneal vein. No evidence of color flow within 1 of the paired peroneal veins. The other peroneal and posterior tibial veins appear patent. Superficial Great Saphenous Vein: No evidence of thrombus. Normal compressibility and flow on color Doppler imaging. Venous Reflux:  None. Other Findings: Complex fluid collection in the popliteal fossa measures 5.3 x 1.5 x 2.6 cm consistent with a Baker's cyst. LEFT LOWER EXTREMITY Common Femoral Vein: No evidence of thrombus. Normal compressibility, respiratory phasicity and response to augmentation. Saphenofemoral Junction: No evidence of thrombus. Normal compressibility and flow on color Doppler imaging. Profunda Femoral Vein: No evidence of thrombus. Normal compressibility and flow on color Doppler imaging. Femoral Vein: No evidence of thrombus. Normal compressibility, respiratory phasicity and response to augmentation. Popliteal Vein: No evidence of thrombus.  Normal compressibility, respiratory phasicity and response to augmentation. Calf Veins: No evidence of thrombus. Normal compressibility and flow on color Doppler imaging. Superficial Great Saphenous Vein: No evidence of thrombus. Normal compressibility and flow on color Doppler imaging. Venous Reflux:  None. Other Findings: Complex fluid collection in the popliteal fossa measures 1.7 x 0.4 x 2.0 cm consistent with a Baker's cyst. IMPRESSION: 1. Positive for isolated deep venous thrombosis in 1 of the paired peroneal veins in the right calf. 2. No evidence of deep venous thrombosis in the left lower extremity. 3. Bilateral, right larger than left, Baker's cysts. Electronically Signed   By: Jacqulynn Cadet M.D.   On: 05/15/2016 17:00   US Thyroid  Result Date: 05/15/2016 CLINICAL DATA:  Incidental on CT. 83 year old female with a thyroid mass seen incidentally on recent CT scan of the chest EXAM: THYROID ULTRASOUND TECHNIQUE: Ultrasound examination of the thyroid gland and adjacent soft tissues was performed. COMPARISON:  CT scan of the chest performed earlier today FINDINGS: Parenchymal Echotexture: Mildly heterogenous Isthmus: 0.4 cm Right lobe: 4.2 x 1.8 x 1.7 cm Left lobe: 4.1 x 1.8 x 1.5 cm _________________________________________________________ Estimated total number of nodules >/= 1 cm: 3 Number of spongiform nodules >/=  2 cm not described below (TR1): 0 Number of mixed cystic and solid nodules >/= 1.5 cm not described below (TR2): 0 _________________________________________________________ Nodule # 1: Location: Right; Mid Maximum size: 1.2 cm; Other 2 dimensions: 1.1 x 1.1 cm Composition: solid/almost completely solid (2) Echogenicity: isoechoic (1) Shape: not taller-than-wide (0) Margins: ill-defined (0) Echogenic foci: punctate echogenic foci (3) ACR TI-RADS total points: 6. ACR TI-RADS risk category: TR4 (4-6 points). ACR TI-RADS recommendations: *Given size (>/= 1 - 1.4 cm) and appearance, a  follow-up ultrasound in 1 year should be considered based on TI-RADS criteria. _________________________________________________________ New the 2 cm mass identified on the recent prior CT scan corresponds with a 2.0 x 1.7 x 1.6 cm sonographically simple cyst. No further follow-up is required for this lesion. Similarly, there is a benign spongiform hypoechoic nodule in the left mid gland which measures 1.0 cm. IMPRESSION: 1. The 2 cm mass identified on the recent CT scan corresponds with a sonographically simple and therefore benign 2 cm cyst in the left upper gland. 2. A TI-RADS category 4 nodule in the right mid gland meets criteria for follow-up ultrasound in 1 year (provided the patient is a surgical candidate for thyroidectomy. If the patient is not a surgical candidate then no further follow-up is required). The above is in keeping with the ACR TI-RADS recommendations - J Am Coll Radiol 2017;14:587-595. Electronically Signed   By: Jacqulynn Cadet M.D.   On: 05/15/2016 17:06   ASSESSMENT AND PLAN:  Wanda Davenport  is a 83 y.o. female with a known history of ST EMI status post drug-eluting stent in LAD in April 7737, diastolic congestive heart failure, hypertension, known history of left bundle-branch block on EKG comes to the emergency room a cup in by daughter from home with increasing shortness of breath with started past Sunday more so today. Patient in the emergency room underwent workup and CT of the chest was done which shows multiple emboli on the right.  1.Acute respiratory distress secondary to multiple emboli on the right/PE  -Admit to telemetry  -IV heparin drip.---to oral anticoagulation  -Oxygen as needed  -Ultrasound Doppler showed right peroneal vein   2. Coronary artery disease status post stent recent LAD -Continue aspirin and other cardiac meds -Cardiac consultation with Dr. Nehemiah Massed. -Patient since discharge has not been able to pick up prescription for brilinta. Will hold  off on it.  3. Hypertension continue home meds  4. Thyroid mass incidentally noted on CT of the chest - ultrasound of the thyroid shows simple cyst  5. DVT prophylaxis on eliquis  6. PT Lavena Bullion worker/Management   Case discussed with Care Management/Social Worker. Management plans discussed with the patient, family and they are in agreement.  CODE STATUS: FULL  DVT Prophylaxis: Eliquis  TOTAL TIME TAKING CARE OF THIS PATIENT: *30* minutes.  >50% time spent on counselling and coordination of care pt and dter in the room  POSSIBLE D/C IN *1-2* DAYS, DEPENDING ON CLINICAL CONDITION.  Note: This dictation was prepared with Dragon dictation along with smaller phrase technology. Any transcriptional errors that result from this process are unintentional.  Hillary Schwegler M.D on 05/16/2016 at 7:33 PM  Between 7am to 6pm - Pager - [phone]  After 6pm go to [email or web address] - password [email or web address] [facility]  Office  [phone]  CC: Primary care physician; Mellody Dance, DO

## 2016-05-16 NOTE — Progress Notes (Signed)
ANTICOAGULATION CONSULT NOTE - Initial Consult  Pharmacy Consult for Heparin Drip Indication: pulmonary embolus  Allergies  Allergen Reactions  . Fenofibrate Other (See Comments)    Myalgias/gi upset   . Statins Other (See Comments)    Myalgias   . Sulfonamide Derivatives Nausea And Vomiting    REACTION: nausea    Patient Measurements: Height: 5\' 2"  (157.5 cm) Weight: 161 lb (73 kg) IBW/kg (Calculated) : 50.1 Heparin Dosing Weight: 65.74 kg  Vital Signs: Temp: 98.4 F (36.9 C) (05/16 1950) Temp Source: Oral (05/16 1950) BP: 92/55 (05/16 1950) Pulse Rate: 69 (05/16 1950)  Labs:  Recent Labs  05/15/16 0700 05/15/16 1325  05/15/16 1740 05/16/16 0054 05/16/16 0158 05/16/16 1125 05/16/16 1919  HGB 12.1  --   --   --   --  11.7*  --   --   HCT 36.8  --   --   --   --  34.9*  --   --   PLT 255  --   --   --   --  215  --   --   APTT 28  --   --   --   --   --   --   --   LABPROT 13.0  --   --   --   --   --   --   --   INR 0.98  --   --   --   --   --   --   --   HEPARINUNFRC  --   --   < > 0.69  --  0.75* 0.70 0.61  CREATININE 1.15*  --   --   --   --   --   --   --   TROPONINI 0.10* 0.09*  --  0.09* 0.09*  --   --   --   < > = values in this interval not displayed.  Estimated Creatinine Clearance: 29.8 mL/min (A) (by C-G formula based on SCr of 1.15 mg/dL (H)).   Medical History: Past Medical History:  Diagnosis Date  . Allergic rhinitis   . Anxiety   . Arthritis   . Atypical chest pain   . Bronchitis 01/14/2016  . Chronic diastolic CHF (congestive heart failure) (HCC)    Echo 06/2010 EF 60-65%, no RWMAs, grade 1 diastolic dysfunction, mild LAE, PASP 15mmHg.  Marland Kitchen DIVERTICULOSIS, COLON   . HEMORRHOIDS, INTERNAL   . Hepatic steatosis   . Hyperlipidemia   . HYPERTENSION   . LBBB (left bundle branch block)    a. present on ECG 06/2010  . Osteopenia   . Pre-diabetes    A1c 6.0    Medications:  Scheduled:  . apixaban  10 mg Oral BID   Followed by  .  [START ON 05/23/2016] apixaban  5 mg Oral BID  . aspirin  325 mg Oral Daily  . bisoprolol  10 mg Oral Daily  . calcium-vitamin D  1 tablet Oral BID  . escitalopram  10 mg Oral Daily  . fluticasone  2 spray Each Nare Daily  . isosorbide mononitrate  30 mg Oral Daily  . lisinopril  20 mg Oral Daily   Infusions:  . heparin 1,050 Units/hr (05/16/16 0602)    Assessment: Heparin drip ordered for PE in this 83 yo female presenting with shortness of breath.   Goal of Therapy:  Heparin level 0.3-0.7 units/ml Monitor platelets by anticoagulation protocol: Yes   Plan:  Give 4000 units bolus  x 1 Start heparin infusion at 1150 units/hr Check anti-Xa level in 8 hours and daily while on heparin Continue to monitor H&H and platelets   HL ordered for tonight at 17:30.  5/15 1740 HL therapeutic x 1. Continue current rate. Will recheck HL in 8 hours.  5/16 02:00 heparin level 0.75. Decreasing to 1050 units/hr. Recheck in 8 hours.  5/16: Heparin level @ 11:25 resulted @ 0.70. Will recheck in 8 hours.   5/16 HL @ 1917 therapeutic at 0.61. Will continue current drip rate. Heparin drip to be discontinued at 2200 and patient to receive 1st dose of apixaban .   Sonni Barse M Lauralye Kinn, Pharm.D., BCPS Clinical Pharmacist 05/16/2016,7:51 PM

## 2016-05-16 NOTE — Care Management Note (Signed)
Case Management Note  Patient Details  Name: Wanda Davenport MRN: [id number] Date of Birth: [birth year]  Subjective/Objective:                 Admitted from home.  Recent discharge from [facility] to [facility].  Spent one week at the facility and discharged home with home health SN and PT through [facility]. Notified agency of admission It was reported during progression that patient did not obtain her Brilinta when discharged because it was too expensive.  Patient and daughter relayed that patient has not been taking Brilinta because it was called in to the wrong drug store and did not know where.  She is not on Briliinta at present. Patient is diagnosed with pulmonary embolus and DVT.  Currently on heparin drip.  Physical therapy to consult but is contraindicated at present.   Anticipate discharge on Eliquis  Action/Plan:  Coordinate resumption of home health services if able to discharge directly home. Provide needed drug 30 day trial coupons   Expected Discharge Date:                  Expected Discharge Plan:     In-House Referral:     Discharge planning Services     Post Acute Care Choice:    Choice offered to:     DME Arranged:    DME Agency:     HH Arranged:    HH Agency:     Status of Service:     If discussed at [employer], dates discussed:    Additional Comments:  Katrina Stack, RN 05/16/2016, 11:34 AM

## 2016-05-16 NOTE — Progress Notes (Signed)
ANTICOAGULATION CONSULT NOTE - Initial Consult  Pharmacy Consult for apixaban Indication: pulmonary embolus  Allergies  Allergen Reactions  . Fenofibrate Other (See Comments)    Myalgias/gi upset   . Statins Other (See Comments)    Myalgias   . Sulfonamide Derivatives Nausea And Vomiting    REACTION: nausea    Patient Measurements: Height: 5\' 2"  (157.5 cm) Weight: 161 lb (73 kg) IBW/kg (Calculated) : 50.1  Vital Signs: Temp: 98.2 F (36.8 C) (05/16 1006) Temp Source: Oral (05/16 1006) BP: 101/49 (05/16 1248) Pulse Rate: 68 (05/16 1248)  Labs:  Recent Labs  05/15/16 0700 05/15/16 1325 05/15/16 1740 05/16/16 0054 05/16/16 0158 05/16/16 1125  HGB 12.1  --   --   --  11.7*  --   HCT 36.8  --   --   --  34.9*  --   PLT 255  --   --   --  215  --   APTT 28  --   --   --   --   --   LABPROT 13.0  --   --   --   --   --   INR 0.98  --   --   --   --   --   HEPARINUNFRC  --   --  0.69  --  0.75* 0.70  CREATININE 1.15*  --   --   --   --   --   TROPONINI 0.10* 0.09* 0.09* 0.09*  --   --     Estimated Creatinine Clearance: 29.8 mL/min (A) (by C-G formula based on SCr of 1.15 mg/dL (H)).   Medical History: Past Medical History:  Diagnosis Date  . Allergic rhinitis   . Anxiety   . Arthritis   . Atypical chest pain   . Bronchitis 01/14/2016  . Chronic diastolic CHF (congestive heart failure) (HCC)    Echo 06/2010 EF 60-65%, no RWMAs, grade 1 diastolic dysfunction, mild LAE, PASP 85mmHg.  Marland Kitchen DIVERTICULOSIS, COLON   . HEMORRHOIDS, INTERNAL   . Hepatic steatosis   . Hyperlipidemia   . HYPERTENSION   . LBBB (left bundle branch block)    a. present on ECG 06/2010  . Osteopenia   . Pre-diabetes    A1c 6.0    Assessment: Pharmacy consulted to dose apixaban for this 83 year old female admitted with a PE. Patient is currently on a heparin drip which will be converted to apixaban tonight per MD.  Goal of Therapy:  Monitor platelets by anticoagulation protocol:  Yes   Plan:  Entered stop time on heparin drip for 2159 this evening. Apixaban to begin at 2200 this evening. Ordered apixaban 10 mg PO BID x 14 doses (7 days) followed by apixaban 5 mg PO BID. CBC and SCr to be checked at least every 72 hours per protocol.  Lenis Noon, PharmD Clinical Pharmacist 05/16/2016,1:16 PM

## 2016-05-16 NOTE — Progress Notes (Signed)
[facility] Cardiology [facility] Encounter Note  Patient: Wanda Davenport / Admit Date: 05/15/2016 / Date of Encounter: 05/16/2016, 8:35 AM   Subjective: Patient is feeling much better today with less shortness of breath. Patient did not have any heart failure or chest pain type symptoms off admission. The patient has had an elevated troponin of 0.09 without evidence of acute coronary syndrome but possible mild the demand ischemia from the possible hypoxia from pulmonary embolism. Patient has tolerated her current medical regimen without evidence of bleeding complications.  Review of Systems: Positive for: Shortness of breath Negative for: Vision change, hearing change, syncope, dizziness, nausea, vomiting,diarrhea, bloody stool, stomach pain, cough, congestion, diaphoresis, urinary frequency, urinary pain,skin lesions, skin rashes Others previously listed  Objective: Telemetry: Normal sinus rhythm with bundle branch block Physical Exam: Blood pressure 120/63, pulse 75, temperature 98.6 F (37 C), resp. rate 16, height 5\' 2"  (1.575 m), weight 73 kg (161 lb), SpO2 97 %. Body mass index is 29.45 kg/m. General: Well developed, well nourished, in no acute distress. Head: Normocephalic, atraumatic, sclera non-icteric, no xanthomas, nares are without discharge. Neck: No apparent masses Lungs: Normal respirations with few wheezes, no rhonchi, no rales , no crackles   Heart: Regular rate and rhythm, normal S1 S2, no murmur, no rub, no gallop, PMI is normal size and placement, carotid upstroke normal without bruit, jugular venous pressure normal Abdomen: Soft, non-tender, non-distended with normoactive bowel sounds. No hepatosplenomegaly. Abdominal aorta is normal size without bruit Extremities: Trace edema, no clubbing, no cyanosis, no ulcers,  Peripheral: 2+ radial, 2+ femoral, 2+ dorsal pedal pulses Neuro: Alert and oriented. Moves all extremities spontaneously. Psych:  Responds to questions  appropriately with a normal affect.   Intake/Output Summary (Last 24 hours) at 05/16/16 0835 Last data filed at 05/16/16 0500  Gross per 24 hour  Intake           108.87 ml  Output              350 ml  Net          -241.13 ml    Inpatient Medications:  . aspirin  325 mg Oral Daily  . bisoprolol  10 mg Oral Daily  . calcium-vitamin D  1 tablet Oral BID  . escitalopram  10 mg Oral Daily  . fluticasone  2 spray Each Nare Daily  . isosorbide mononitrate  30 mg Oral Daily  . lisinopril  20 mg Oral Daily   Infusions:  . heparin 1,050 Units/hr (05/16/16 0602)    Labs:  Recent Labs  05/15/16 0700  NA 137  K 4.3  CL 107  CO2 21*  GLUCOSE 106*  BUN 36*  CREATININE 1.15*  CALCIUM 10.0    Recent Labs  05/15/16 0700  AST 17  ALT 10*  ALKPHOS 76  BILITOT 0.7  PROT 6.7  ALBUMIN 3.6    Recent Labs  05/15/16 0700 05/16/16 0158  WBC 11.9* 7.7  NEUTROABS 8.8*  --   HGB 12.1 11.7*  HCT 36.8 34.9*  MCV 86.7 86.7  PLT 255 215    Recent Labs  05/15/16 0700 05/15/16 1325 05/15/16 1740 05/16/16 0054  TROPONINI 0.10* 0.09* 0.09* 0.09*   Invalid input(s): POCBNP No results for input(s): HGBA1C in the last 72 hours.   Weights: Filed Weights   05/15/16 0654  Weight: 73 kg (161 lb)     Radiology/Studies:  Dg Chest 2 View  Result Date: 05/15/2016 CLINICAL DATA:  82 year old female with progressive shortness  of breath since yesterday. EXAM: CHEST  2 VIEW COMPARISON:  05/06/2016 and earlier. FINDINGS: Seated AP and lateral views of the chest. Stable cardiac size and mediastinal contours. Visualized tracheal air column is within normal limits. New left lung base and medial costophrenic angle opacity resembles a subpulmonic left pleural effusion. Small volume of fluid tracking in the bilateral pleural fissures including the right minor fissure. No superimposed pneumothorax. Mild pulmonary vascular congestion without overt edema. Calcified aortic atherosclerosis. Stable  visualized osseous structures. Negative visible bowel gas pattern. IMPRESSION: 1. Left lung base opacity favored due to small or moderate size left subpulmonic pleural effusion. Left lower lobe consolidation (such as due to aspiration or pneumonia) is felt less likely. 2. Small volume fluid in the bilateral pleural fissures. Mild pulmonary vascular congestion without overt edema. Electronically Signed   By: Genevie Ann M.D.   On: 05/15/2016 07:38   Ct Angio Chest Pe W Or Wo Contrast  Result Date: 05/15/2016 CLINICAL DATA:  Shortness of Breath EXAM: CT ANGIOGRAPHY CHEST WITH CONTRAST TECHNIQUE: Multidetector CT imaging of the chest was performed using the standard protocol during bolus administration of intravenous contrast. Multiplanar CT image reconstructions and MIPs were obtained to evaluate the vascular anatomy. CONTRAST:  60 mL Isovue 370 nonionic COMPARISON:  Chest radiograph May 15, 2016 FINDINGS: Cardiovascular: There are multiple right-sided pulmonary emboli rising from the distal right main pulmonary artery with extension into multiple upper and lower lobe pulmonary artery branches. No pulmonary embolus identified to the left of midline. The right ventricle to left ventricular diameter ratio is less than 0.9, not consistent with right heart strain. There is no thoracic aortic aneurysm or dissection. There is moderate atherosclerotic calcification in the proximal left subclavian and right common carotid arteries. There are multiple foci of atherosclerotic calcification in the aorta. There are foci of coronary artery calcification. Pericardium is not appreciably thickened. Mediastinum/Nodes: The thyroid has an inhomogeneous appearance. There is a mass arising from the left lobe of the thyroid measuring 2.2 x 1.6 cm. There are several mildly prominent mediastinal lymph nodes. To the right of the distal trachea, there is a lymph node measuring 1.5 x 1.0 cm. At the level of the aortic arch to the left, there is  a lymph node measuring 1.9 x 1.1 cm. There is a lymph node in the right hilar region measuring 1.6 x 1.0 cm. There is a small hiatal hernia. Lungs/Pleura: There are moderate free-flowing pleural effusions bilaterally with consolidation in both lung bases, more pronounced on the left than on the right. There is mild interstitial edema. There is patchy atelectasis in the left upper lobe as well as in both lung bases. Upper Abdomen: In the visualized upper abdomen, there is atherosclerotic calcification in the aorta and major mesenteric branches. Visualized upper abdominal structures otherwise appear unremarkable. Musculoskeletal: There is degenerative change in the thoracic and visualized upper lumbar regions. There are no blastic or lytic bone lesions. Review of the MIP images confirms the above findings. IMPRESSION: Multiple pulmonary emboli on the right without right heart strain. Evidence a degree of congestive heart failure. Mild pulmonary edema with cardiomegaly and moderate pleural effusions. Bibasilar consolidation, somewhat more on the left than the right. This consolidation is largely due to compressive atelectasis, although superimposed pneumonia must be of concern in these areas, particularly on the left. Several mildly enlarged lymph nodes of uncertain etiology. These lymph nodes may well have reactive etiology given the changes in the lungs. Multiple foci of atherosclerotic calcification as  well as foci of coronary artery calcification. **An incidental finding of potential clinical significance has been found. Left lobe thyroid mass measuring 2.2 x 1.6 cm. Consider further evaluation with thyroid ultrasound. If patient is clinically hyperthyroid, consider nuclear medicine thyroid uptake and scan.** Critical Value/emergent results were called by telephone at the time of interpretation on 05/15/2016 at 8:36 am to Dr. Shirlyn Goltz , who verbally acknowledged these results. Electronically Signed   By: Lowella Grip III M.D.   On: 05/15/2016 08:36   US Venous Img Lower Bilateral  Result Date: 05/15/2016 CLINICAL DATA:  83 year old female with new diagnosis of PE. Evaluate for residual DVT. EXAM: BILATERAL LOWER EXTREMITY VENOUS DOPPLER ULTRASOUND TECHNIQUE: Gray-scale sonography with graded compression, as well as color Doppler and duplex ultrasound were performed to evaluate the lower extremity deep venous systems from the level of the common femoral vein and including the common femoral, femoral, profunda femoral, popliteal and calf veins including the posterior tibial, peroneal and gastrocnemius veins when visible. The superficial great saphenous vein was also interrogated. Spectral Doppler was utilized to evaluate flow at rest and with distal augmentation maneuvers in the common femoral, femoral and popliteal veins. COMPARISON:  None. FINDINGS: RIGHT LOWER EXTREMITY Common Femoral Vein: No evidence of thrombus. Normal compressibility, respiratory phasicity and response to augmentation. Saphenofemoral Junction: No evidence of thrombus. Normal compressibility and flow on color Doppler imaging. Profunda Femoral Vein: No evidence of thrombus. Normal compressibility and flow on color Doppler imaging. Femoral Vein: No evidence of thrombus. Normal compressibility, respiratory phasicity and response to augmentation. Popliteal Vein: No evidence of thrombus. Normal compressibility, respiratory phasicity and response to augmentation. Calf Veins: Noncompressible peroneal vein. No evidence of color flow within 1 of the paired peroneal veins. The other peroneal and posterior tibial veins appear patent. Superficial Great Saphenous Vein: No evidence of thrombus. Normal compressibility and flow on color Doppler imaging. Venous Reflux:  None. Other Findings: Complex fluid collection in the popliteal fossa measures 5.3 x 1.5 x 2.6 cm consistent with a Baker's cyst. LEFT LOWER EXTREMITY Common Femoral Vein: No evidence of  thrombus. Normal compressibility, respiratory phasicity and response to augmentation. Saphenofemoral Junction: No evidence of thrombus. Normal compressibility and flow on color Doppler imaging. Profunda Femoral Vein: No evidence of thrombus. Normal compressibility and flow on color Doppler imaging. Femoral Vein: No evidence of thrombus. Normal compressibility, respiratory phasicity and response to augmentation. Popliteal Vein: No evidence of thrombus. Normal compressibility, respiratory phasicity and response to augmentation. Calf Veins: No evidence of thrombus. Normal compressibility and flow on color Doppler imaging. Superficial Great Saphenous Vein: No evidence of thrombus. Normal compressibility and flow on color Doppler imaging. Venous Reflux:  None. Other Findings: Complex fluid collection in the popliteal fossa measures 1.7 x 0.4 x 2.0 cm consistent with a Baker's cyst. IMPRESSION: 1. Positive for isolated deep venous thrombosis in 1 of the paired peroneal veins in the right calf. 2. No evidence of deep venous thrombosis in the left lower extremity. 3. Bilateral, right larger than left, Baker's cysts. Electronically Signed   By: Jacqulynn Cadet M.D.   On: 05/15/2016 17:00   Dg Chest Port 1 View  Result Date: 05/06/2016 CLINICAL DATA:  83 year old female with un stable angina. Shortness of breath and chest pain. Respiratory failure. EXAM: PORTABLE CHEST 1 VIEW COMPARISON:  05/05/2016 and earlier. FINDINGS: Portable AP upright view at 0519 hours. Stable lung volumes. Stable mediastinal contours with borderline to mild cardiomegaly. Visualized tracheal air column is within normal limits. No pneumothorax.  Mildly decreased pulmonary vascularity since yesterday with no overt edema. Continued dense retrocardiac opacity. No large pleural effusion. IMPRESSION: 1. Decreased pulmonary vascularity since yesterday.  No acute edema. 2. Bilateral lower lobe collapse or consolidation. Electronically Signed   By: Genevie Ann  M.D.   On: 05/06/2016 07:13   Dg Chest Port 1 View  Result Date: 05/05/2016 CLINICAL DATA:  Acute onset shortness of breath. History of hypertension, CHF. EXAM: PORTABLE CHEST 1 VIEW COMPARISON:  Chest radiograph March 29, 2016 FINDINGS: The cardiac silhouette is mildly enlarged and unchanged. Mild chronic interstitial changes without focal consolidation. Trace LEFT pleural effusion. No pneumothorax. Osteopenia. Soft tissue planes are nonsuspicious. Mildly calcified aortic knob. IMPRESSION: Stable examination: Mild cardiomegaly, chronic interstitial changes with trace LEFT pleural effusion. Electronically Signed   By: Elon Alas M.D.   On: 05/05/2016 02:07   Dg Chest Port 1 View  Result Date: 04/29/2016 CLINICAL DATA:  Shortness of breath and chest pain EXAM: PORTABLE CHEST 1 VIEW COMPARISON:  April 26, 2016 FINDINGS: There are rather minimal pleural effusions bilaterally. There is no edema or consolidation. Heart is mildly enlarged. There is no evident adenopathy. There is aortic atherosclerosis. No bone lesions. IMPRESSION: Mild cardiomegaly with rather minimal pleural effusions bilaterally. No edema or consolidation. There is aortic atherosclerosis. Electronically Signed   By: Lowella Grip III M.D.   On: 04/29/2016 11:35   Dg Chest Port 1 View  Result Date: 04/26/2016 CLINICAL DATA:  Chest pain EXAM: PORTABLE CHEST 1 VIEW COMPARISON:  09/23/2011 FINDINGS: The heart size and mediastinal contours are within normal limits. Both lungs are clear. The visualized skeletal structures are unremarkable. IMPRESSION: No active disease. Electronically Signed   By: Inez Catalina M.D.   On: 04/26/2016 10:32   US Thyroid  Result Date: 05/15/2016 CLINICAL DATA:  Incidental on CT. 83 year old female with a thyroid mass seen incidentally on recent CT scan of the chest EXAM: THYROID ULTRASOUND TECHNIQUE: Ultrasound examination of the thyroid gland and adjacent soft tissues was performed. COMPARISON:  CT  scan of the chest performed earlier today FINDINGS: Parenchymal Echotexture: Mildly heterogenous Isthmus: 0.4 cm Right lobe: 4.2 x 1.8 x 1.7 cm Left lobe: 4.1 x 1.8 x 1.5 cm _________________________________________________________ Estimated total number of nodules >/= 1 cm: 3 Number of spongiform nodules >/=  2 cm not described below (TR1): 0 Number of mixed cystic and solid nodules >/= 1.5 cm not described below (TR2): 0 _________________________________________________________ Nodule # 1: Location: Right; Mid Maximum size: 1.2 cm; Other 2 dimensions: 1.1 x 1.1 cm Composition: solid/almost completely solid (2) Echogenicity: isoechoic (1) Shape: not taller-than-wide (0) Margins: ill-defined (0) Echogenic foci: punctate echogenic foci (3) ACR TI-RADS total points: 6. ACR TI-RADS risk category: TR4 (4-6 points). ACR TI-RADS recommendations: *Given size (>/= 1 - 1.4 cm) and appearance, a follow-up ultrasound in 1 year should be considered based on TI-RADS criteria. _________________________________________________________ New the 2 cm mass identified on the recent prior CT scan corresponds with a 2.0 x 1.7 x 1.6 cm sonographically simple cyst. No further follow-up is required for this lesion. Similarly, there is a benign spongiform hypoechoic nodule in the left mid gland which measures 1.0 cm. IMPRESSION: 1. The 2 cm mass identified on the recent CT scan corresponds with a sonographically simple and therefore benign 2 cm cyst in the left upper gland. 2. A TI-RADS category 4 nodule in the right mid gland meets criteria for follow-up ultrasound in 1 year (provided the patient is a surgical candidate for thyroidectomy.  If the patient is not a surgical candidate then no further follow-up is required). The above is in keeping with the ACR TI-RADS recommendations - J Am Coll Radiol 2017;14:587-595. Electronically Signed   By: Jacqulynn Cadet M.D.   On: 05/15/2016 17:06     Assessment and Recommendation  83 y.o. female  with the known coronary artery disease status post recent ST elevation myocardial infarction 2 months prior status post PCI and stent placed in left anterior descending artery and essential hypertension mixed hyperlipidemia having new onset of pulmonary embolism and hypoxia and shortness of breath now improved with heparin and no current evidence of myocardial infarction or heart failure 1. Continue heparin for pulmonary embolism and transition over to anticoagulation including elequis 2. Use single antiplatelet medication management including aspirin and avoid Plavix at this time due to concerns of bleeding complication status post previous myocardial infarction PCI and stent placement 3. Continue ACE inhibitor beta blocker for recent hypertension and myocardial infarction 4. High intensity cholesterol therapy for further risk reduction cardiovascular event 5. Isosorbide for coronary artery disease 6. Ambulate and follow for any further significant symptoms or wrists issues and possible discharge to home when ambulating well with follow-up next week For further adjustments of medications from cardiac standpoint  Signed, Serafina Royals M.D. FACC

## 2016-05-17 ENCOUNTER — Other Ambulatory Visit: Payer: Self-pay

## 2016-05-17 MED ORDER — APIXABAN 5 MG PO TABS: ORAL_TABLET | ORAL | 2 refills | Status: DC

## 2016-05-17 NOTE — Care Management (Signed)
Informed that patient will not require home oxygen.  Referral called to [facility] for SN and PT resumption of care. Requested home visit within 24 hours of discharge.  Provided Eliquis 30 day trial coupon

## 2016-05-17 NOTE — Discharge Summary (Signed)
[facility] NAME: Wanda Davenport    MR#:  [id number]  DATE OF BIRTH:  1923/03/03  DATE OF ADMISSION:  05/15/2016 ADMITTING PHYSICIAN: Fritzi Mandes, MD  DATE OF DISCHARGE: 05/17/2016  PRIMARY CARE PHYSICIAN: Mellody Dance, DO    ADMISSION DIAGNOSIS:  Shortness of breath [R06.02] PE (pulmonary thromboembolism) ([place]) [I26.99] Thyroid mass [E07.9] Other pulmonary embolism with acute cor pulmonale, unspecified chronicity (HCC) [Y60.63] Diastolic congestive heart failure, unspecified HF chronicity (HCC) [I50.30]  DISCHARGE DIAGNOSIS:  Right side Pulmonary embolism Right peroneal vein DVT  SECONDARY DIAGNOSIS:   Past Medical History:  Diagnosis Date  . Allergic rhinitis   . Anxiety   . Arthritis   . Atypical chest pain   . Bronchitis 01/14/2016  . Chronic diastolic CHF (congestive heart failure) (HCC)    Echo 06/2010 EF 60-65%, no RWMAs, grade 1 diastolic dysfunction, mild LAE, PASP 58mmHg.  Marland Kitchen DIVERTICULOSIS, COLON   . HEMORRHOIDS, INTERNAL   . Hepatic steatosis   . Hyperlipidemia   . HYPERTENSION   . LBBB (left bundle branch block)    a. present on ECG 06/2010  . Osteopenia   . Pre-diabetes    A1c 6.0    HOSPITAL COURSE:  Wanda Davenport a 84 y.o. femalewith a known history of ST EMI status post drug-eluting stent in LAD in April 0160, diastolic congestive heart failure, hypertension, known history of left bundle-branch block on EKG comes to the emergency room a cup in by daughter from home with increasing shortness of breath with started past Sunday more so today. Patient in the emergency room underwent workup and CT of the chest was done which shows multiple emboli on the right.  1.Acute respiratory distress secondary to multiple emboli on the right/PEAnd right lower extremity DVT  -Admit to telemetry  -IV heparin drip.--- changed to oral anticoagulation with po eliquis -Patient does not qualify for home  oxygen -Ultrasound Doppler showed right peroneal vein DVT  2. Coronary artery disease status post stent recent LAD -Continue aspirin  -Cardiac consultation with Dr. Nehemiah Massed.appreciated. Recommends only asa  3. Hypertension continue home meds  4. Thyroid mass incidentally noted on CT of the chest - ultrasound of the thyroid shows simple cyst  5. DVT prophylaxis on eliquis  6. PT recommends no PT.  D/w son Discharge patient home CONSULTS OBTAINED:    DRUG ALLERGIES:   Allergies  Allergen Reactions  . Fenofibrate Other (See Comments)    Myalgias/gi upset   . Statins Other (See Comments)    Myalgias   . Sulfonamide Derivatives Nausea And Vomiting    REACTION: nausea    DISCHARGE MEDICATIONS:   Current Discharge Medication List    START taking these medications   Details  apixaban (ELIQUIS) 5 MG TABS tablet Take 10 mg (2 tabs) twice a day till 05/22/16 and then from 05/23/16 take 1 tab (5 mg ) two times a day Qty: 60 tablet, Refills: 2      CONTINUE these medications which have NOT CHANGED   Details  albuterol (PROVENTIL HFA;VENTOLIN HFA) 108 (90 BASE) MCG/ACT inhaler Inhale 2 puffs into the lungs every 6 (six) hours as needed. Wheezing or shortness of breath    ALPRAZolam (XANAX) 0.25 MG tablet Take 1 tablet (0.25 mg total) by mouth 2 (two) times daily as needed for anxiety. Qty: 30 tablet, Refills: 0    aspirin 325 MG EC tablet Take 325 mg by mouth daily.    bisoprolol (ZEBETA) 10  MG tablet Take 1 tablet (10 mg total) by mouth daily. Qty: 90 tablet, Refills: 0    Calcium Carbonate-Vitamin D 600-400 MG-UNIT tablet Take 2 tablets by mouth daily. Qty: 60 tablet, Refills: 11    escitalopram (LEXAPRO) 10 MG tablet TAKE 1 TABLET (10 MG TOTAL) BY MOUTH DAILY. Qty: 90 tablet, Refills: 1   Associated Diagnoses: Anxiety    fluticasone (FLONASE) 50 MCG/ACT nasal spray Place 2 sprays into the nose daily. Nasal congestion Rarely used     furosemide (LASIX) 20  MG tablet Take 1 tablet (20 mg total) by mouth daily. Qty: 30 tablet, Refills: 0    isosorbide mononitrate (IMDUR) 30 MG 24 hr tablet Take 1 tablet (30 mg total) by mouth daily. Qty: 30 tablet, Refills: 1    lisinopril (PRINIVIL,ZESTRIL) 20 MG tablet Take 1 tablet by mouth daily.    naphazoline-pheniramine (NAPHCON-A) 0.025-0.3 % ophthalmic solution Place 1 drop into both eyes 4 (four) times daily as needed for irritation.    nitroGLYCERIN (NITROSTAT) 0.3 MG SL tablet Place 0.3 mg under the tongue every 5 (five) minutes as needed for chest pain.      STOP taking these medications     ticagrelor (BRILINTA) 90 MG TABS tablet         If you experience worsening of your admission symptoms, develop shortness of breath, life threatening emergency, suicidal or homicidal thoughts you must seek medical attention immediately by calling 911 or calling your MD immediately  if symptoms less severe.  You Must read complete instructions/literature along with all the possible adverse reactions/side effects for all the Medicines you take and that have been prescribed to you. Take any new Medicines after you have completely understood and accept all the possible adverse reactions/side effects.   Please note  You were cared for by a hospitalist during your hospital stay. If you have any questions about your discharge medications or the care you received while you were in the hospital after you are discharged, you can call the unit and asked to speak with the hospitalist on call if the hospitalist that took care of you is not available. Once you are discharged, your primary care physician will handle any further medical issues. Please note that NO REFILLS for any discharge medications will be authorized once you are discharged, as it is imperative that you return to your primary care physician (or establish a relationship with a primary care physician if you do not have one) for your aftercare needs so that  they can reassess your need for medications and monitor your lab values. Today   SUBJECTIVE   Doing well  VITAL SIGNS:  Blood pressure (!) 98/46, pulse 63, temperature 98 F (36.7 C), temperature source Oral, resp. rate 12, height 5\' 2"  (1.575 m), weight 73 kg (161 lb), SpO2 92 %.  I/O:   Intake/Output Summary (Last 24 hours) at 05/17/16 1255 Last data filed at 05/17/16 0055  Gross per 24 hour  Intake           293.42 ml  Output              100 ml  Net           193.42 ml    PHYSICAL EXAMINATION:  GENERAL:  83 y.o.-year-old patient lying in the bed with no acute distress.  EYES: Pupils equal, round, reactive to light and accommodation. No scleral icterus. Extraocular muscles intact.  HEENT: Head atraumatic, normocephalic. Oropharynx and nasopharynx clear.  NECK:  Supple, no jugular venous distention. No thyroid enlargement, no tenderness.  LUNGS: Normal breath sounds bilaterally, no wheezing, rales,rhonchi or crepitation. No use of accessory muscles of respiration.  CARDIOVASCULAR: S1, S2 normal. No murmurs, rubs, or gallops.  ABDOMEN: Soft, non-tender, non-distended. Bowel sounds present. No organomegaly or mass.  EXTREMITIES: No pedal edema, cyanosis, or clubbing.  NEUROLOGIC: Cranial nerves II through XII are intact. Muscle strength 5/5 in all extremities. Sensation intact. Gait not checked.  PSYCHIATRIC: The patient is alert and oriented x 3.  SKIN: No obvious rash, lesion, or ulcer.   DATA REVIEW:   CBC   Recent Labs Lab 05/16/16 0158  WBC 7.7  HGB 11.7*  HCT 34.9*  PLT 215    Chemistries   Recent Labs Lab 05/15/16 0700  NA 137  K 4.3  CL 107  CO2 21*  GLUCOSE 106*  BUN 36*  CREATININE 1.15*  CALCIUM 10.0  AST 17  ALT 10*  ALKPHOS 76  BILITOT 0.7    Microbiology Results   No results found for this or any previous visit (from the past 240 hour(s)).  RADIOLOGY:  US Venous Img Lower Bilateral  Result Date: 05/15/2016 CLINICAL DATA:   83 year old female with new diagnosis of PE. Evaluate for residual DVT. EXAM: BILATERAL LOWER EXTREMITY VENOUS DOPPLER ULTRASOUND TECHNIQUE: Gray-scale sonography with graded compression, as well as color Doppler and duplex ultrasound were performed to evaluate the lower extremity deep venous systems from the level of the common femoral vein and including the common femoral, femoral, profunda femoral, popliteal and calf veins including the posterior tibial, peroneal and gastrocnemius veins when visible. The superficial great saphenous vein was also interrogated. Spectral Doppler was utilized to evaluate flow at rest and with distal augmentation maneuvers in the common femoral, femoral and popliteal veins. COMPARISON:  None. FINDINGS: RIGHT LOWER EXTREMITY Common Femoral Vein: No evidence of thrombus. Normal compressibility, respiratory phasicity and response to augmentation. Saphenofemoral Junction: No evidence of thrombus. Normal compressibility and flow on color Doppler imaging. Profunda Femoral Vein: No evidence of thrombus. Normal compressibility and flow on color Doppler imaging. Femoral Vein: No evidence of thrombus. Normal compressibility, respiratory phasicity and response to augmentation. Popliteal Vein: No evidence of thrombus. Normal compressibility, respiratory phasicity and response to augmentation. Calf Veins: Noncompressible peroneal vein. No evidence of color flow within 1 of the paired peroneal veins. The other peroneal and posterior tibial veins appear patent. Superficial Great Saphenous Vein: No evidence of thrombus. Normal compressibility and flow on color Doppler imaging. Venous Reflux:  None. Other Findings: Complex fluid collection in the popliteal fossa measures 5.3 x 1.5 x 2.6 cm consistent with a Baker's cyst. LEFT LOWER EXTREMITY Common Femoral Vein: No evidence of thrombus. Normal compressibility, respiratory phasicity and response to augmentation. Saphenofemoral Junction: No evidence of  thrombus. Normal compressibility and flow on color Doppler imaging. Profunda Femoral Vein: No evidence of thrombus. Normal compressibility and flow on color Doppler imaging. Femoral Vein: No evidence of thrombus. Normal compressibility, respiratory phasicity and response to augmentation. Popliteal Vein: No evidence of thrombus. Normal compressibility, respiratory phasicity and response to augmentation. Calf Veins: No evidence of thrombus. Normal compressibility and flow on color Doppler imaging. Superficial Great Saphenous Vein: No evidence of thrombus. Normal compressibility and flow on color Doppler imaging. Venous Reflux:  None. Other Findings: Complex fluid collection in the popliteal fossa measures 1.7 x 0.4 x 2.0 cm consistent with a Baker's cyst. IMPRESSION: 1. Positive for isolated deep venous thrombosis in 1 of the paired peroneal veins  in the right calf. 2. No evidence of deep venous thrombosis in the left lower extremity. 3. Bilateral, right larger than left, Baker's cysts. Electronically Signed   By: Jacqulynn Cadet M.D.   On: 05/15/2016 17:00   US Thyroid  Result Date: 05/15/2016 CLINICAL DATA:  Incidental on CT. 83 year old female with a thyroid mass seen incidentally on recent CT scan of the chest EXAM: THYROID ULTRASOUND TECHNIQUE: Ultrasound examination of the thyroid gland and adjacent soft tissues was performed. COMPARISON:  CT scan of the chest performed earlier today FINDINGS: Parenchymal Echotexture: Mildly heterogenous Isthmus: 0.4 cm Right lobe: 4.2 x 1.8 x 1.7 cm Left lobe: 4.1 x 1.8 x 1.5 cm _________________________________________________________ Estimated total number of nodules >/= 1 cm: 3 Number of spongiform nodules >/=  2 cm not described below (TR1): 0 Number of mixed cystic and solid nodules >/= 1.5 cm not described below (TR2): 0 _________________________________________________________ Nodule # 1: Location: Right; Mid Maximum size: 1.2 cm; Other 2 dimensions: 1.1 x 1.1 cm  Composition: solid/almost completely solid (2) Echogenicity: isoechoic (1) Shape: not taller-than-wide (0) Margins: ill-defined (0) Echogenic foci: punctate echogenic foci (3) ACR TI-RADS total points: 6. ACR TI-RADS risk category: TR4 (4-6 points). ACR TI-RADS recommendations: *Given size (>/= 1 - 1.4 cm) and appearance, a follow-up ultrasound in 1 year should be considered based on TI-RADS criteria. _________________________________________________________ New the 2 cm mass identified on the recent prior CT scan corresponds with a 2.0 x 1.7 x 1.6 cm sonographically simple cyst. No further follow-up is required for this lesion. Similarly, there is a benign spongiform hypoechoic nodule in the left mid gland which measures 1.0 cm. IMPRESSION: 1. The 2 cm mass identified on the recent CT scan corresponds with a sonographically simple and therefore benign 2 cm cyst in the left upper gland. 2. A TI-RADS category 4 nodule in the right mid gland meets criteria for follow-up ultrasound in 1 year (provided the patient is a surgical candidate for thyroidectomy. If the patient is not a surgical candidate then no further follow-up is required). The above is in keeping with the ACR TI-RADS recommendations - J Am Coll Radiol 2017;14:587-595. Electronically Signed   By: Jacqulynn Cadet M.D.   On: 05/15/2016 17:06     Management plans discussed with the patient, family and they are in agreement.  CODE STATUS:     Code Status Orders        Start     Ordered   05/15/16 1235  Full code  Continuous     05/15/16 1234    Code Status History    Date Active Date Inactive Code Status Order ID Comments User Context   05/05/2016  3:31 AM 05/06/2016  3:28 PM Full Code [id number]  Wellington Hampshire, MD Inpatient   04/26/2016  5:12 PM 05/01/2016  3:49 AM Full Code [id number]  Nicholes Mango, MD Inpatient   04/26/2016  2:08 PM 04/26/2016  5:12 PM Full Code [id number]  Isaias Cowman, MD Inpatient   09/23/2011  2:11 PM 09/26/2011   4:43 PM Full Code [id number]  Willette Pa, RN Inpatient    Advance Directive Documentation     Most Recent Value  Type of Advance Directive  Healthcare Power of [place], Living will  Pre-existing out of facility DNR order (yellow form or pink MOST form)  -  "MOST" Form in Place?  -      TOTAL TIME TAKING CARE OF THIS PATIENT: *40* minutes.    Jamarien Rodkey M.D on 05/17/2016 at  12:55 PM  Between 7am to 6pm - Pager - [phone] After 6pm go to [email or web address] - password [email or web address] [facility]  Office  [phone]  CC: Primary care physician; Mellody Dance, DO

## 2016-05-17 NOTE — Progress Notes (Signed)
[facility] [place] NAME: Wanda Davenport    MR#:  [id number]  DATE OF BIRTH:  1923/06/15  SUBJECTIVE:  denies any complaints. son in the room  REVIEW OF SYSTEMS:   Review of Systems  Constitutional: Negative for chills, fever and weight loss.  HENT: Negative for ear discharge, ear pain and nosebleeds.   Eyes: Negative for blurred vision, pain and discharge.  Respiratory: Negative for sputum production, shortness of breath, wheezing and stridor.   Cardiovascular: Negative for chest pain, palpitations, orthopnea and PND.  Gastrointestinal: Negative for abdominal pain, diarrhea, nausea and vomiting.  Genitourinary: Negative for frequency and urgency.  Musculoskeletal: Positive for joint pain. Negative for back pain.  Neurological: Positive for weakness. Negative for sensory change, speech change and focal weakness.  Psychiatric/Behavioral: Negative for depression and hallucinations. The patient is not nervous/anxious.    Tolerating Diet:yes Tolerating PT: pending  DRUG ALLERGIES:   Allergies  Allergen Reactions  . Fenofibrate Other (See Comments)    Myalgias/gi upset   . Statins Other (See Comments)    Myalgias   . Sulfonamide Derivatives Nausea And Vomiting    REACTION: nausea    VITALS:  Blood pressure (!) 117/53, pulse 75, temperature 97.8 F (36.6 C), temperature source Oral, resp. rate 18, height 5\' 2"  (1.575 m), weight 73 kg (161 lb), SpO2 95 %.  PHYSICAL EXAMINATION:   Physical Exam  GENERAL:  83 y.o.-year-old patient lying in the bed with no acute distress.  EYES: Pupils equal, round, reactive to light and accommodation. No scleral icterus. Extraocular muscles intact.  HEENT: Head atraumatic, normocephalic. Oropharynx and nasopharynx clear.  NECK:  Supple, no jugular venous distention. No thyroid enlargement, no tenderness.  LUNGS: Normal breath sounds bilaterally, no wheezing, rales, rhonchi. No use of  accessory muscles of respiration.  CARDIOVASCULAR: S1, S2 normal. No murmurs, rubs, or gallops.  ABDOMEN: Soft, nontender, nondistended. Bowel sounds present. No organomegaly or mass.  EXTREMITIES: No cyanosis, clubbing or edema b/l.    NEUROLOGIC: Cranial nerves II through XII are intact. No focal Motor or sensory deficits b/l.   PSYCHIATRIC:  patient is alert and oriented x 3.  SKIN: No obvious rash, lesion, or ulcer.   LABORATORY PANEL:  CBC  Recent Labs Lab 05/16/16 0158  WBC 7.7  HGB 11.7*  HCT 34.9*  PLT 215    Chemistries   Recent Labs Lab 05/15/16 0700  NA 137  K 4.3  CL 107  CO2 21*  GLUCOSE 106*  BUN 36*  CREATININE 1.15*  CALCIUM 10.0  AST 17  ALT 10*  ALKPHOS 76  BILITOT 0.7   Cardiac Enzymes  Recent Labs Lab 05/16/16 0054  TROPONINI 0.09*   RADIOLOGY:  Ct Angio Chest Pe W Or Wo Contrast  Result Date: 05/15/2016 CLINICAL DATA:  Shortness of Breath EXAM: CT ANGIOGRAPHY CHEST WITH CONTRAST TECHNIQUE: Multidetector CT imaging of the chest was performed using the standard protocol during bolus administration of intravenous contrast. Multiplanar CT image reconstructions and MIPs were obtained to evaluate the vascular anatomy. CONTRAST:  60 mL Isovue 370 nonionic COMPARISON:  Chest radiograph May 15, 2016 FINDINGS: Cardiovascular: There are multiple right-sided pulmonary emboli rising from the distal right main pulmonary artery with extension into multiple upper and lower lobe pulmonary artery branches. No pulmonary embolus identified to the left of midline. The right ventricle to left ventricular diameter ratio is less than 0.9, not consistent with right heart strain. There is no thoracic aortic aneurysm  or dissection. There is moderate atherosclerotic calcification in the proximal left subclavian and right common carotid arteries. There are multiple foci of atherosclerotic calcification in the aorta. There are foci of coronary artery calcification. Pericardium  is not appreciably thickened. Mediastinum/Nodes: The thyroid has an inhomogeneous appearance. There is a mass arising from the left lobe of the thyroid measuring 2.2 x 1.6 cm. There are several mildly prominent mediastinal lymph nodes. To the right of the distal trachea, there is a lymph node measuring 1.5 x 1.0 cm. At the level of the aortic arch to the left, there is a lymph node measuring 1.9 x 1.1 cm. There is a lymph node in the right hilar region measuring 1.6 x 1.0 cm. There is a small hiatal hernia. Lungs/Pleura: There are moderate free-flowing pleural effusions bilaterally with consolidation in both lung bases, more pronounced on the left than on the right. There is mild interstitial edema. There is patchy atelectasis in the left upper lobe as well as in both lung bases. Upper Abdomen: In the visualized upper abdomen, there is atherosclerotic calcification in the aorta and major mesenteric branches. Visualized upper abdominal structures otherwise appear unremarkable. Musculoskeletal: There is degenerative change in the thoracic and visualized upper lumbar regions. There are no blastic or lytic bone lesions. Review of the MIP images confirms the above findings. IMPRESSION: Multiple pulmonary emboli on the right without right heart strain. Evidence a degree of congestive heart failure. Mild pulmonary edema with cardiomegaly and moderate pleural effusions. Bibasilar consolidation, somewhat more on the left than the right. This consolidation is largely due to compressive atelectasis, although superimposed pneumonia must be of concern in these areas, particularly on the left. Several mildly enlarged lymph nodes of uncertain etiology. These lymph nodes may well have reactive etiology given the changes in the lungs. Multiple foci of atherosclerotic calcification as well as foci of coronary artery calcification. **An incidental finding of potential clinical significance has been found. Left lobe thyroid mass  measuring 2.2 x 1.6 cm. Consider further evaluation with thyroid ultrasound. If patient is clinically hyperthyroid, consider nuclear medicine thyroid uptake and scan.** Critical Value/emergent results were called by telephone at the time of interpretation on 05/15/2016 at 8:36 am to Dr. Shirlyn Goltz , who verbally acknowledged these results. Electronically Signed   By: Lowella Grip III M.D.   On: 05/15/2016 08:36   US Venous Img Lower Bilateral  Result Date: 05/15/2016 CLINICAL DATA:  83 year old female with new diagnosis of PE. Evaluate for residual DVT. EXAM: BILATERAL LOWER EXTREMITY VENOUS DOPPLER ULTRASOUND TECHNIQUE: Gray-scale sonography with graded compression, as well as color Doppler and duplex ultrasound were performed to evaluate the lower extremity deep venous systems from the level of the common femoral vein and including the common femoral, femoral, profunda femoral, popliteal and calf veins including the posterior tibial, peroneal and gastrocnemius veins when visible. The superficial great saphenous vein was also interrogated. Spectral Doppler was utilized to evaluate flow at rest and with distal augmentation maneuvers in the common femoral, femoral and popliteal veins. COMPARISON:  None. FINDINGS: RIGHT LOWER EXTREMITY Common Femoral Vein: No evidence of thrombus. Normal compressibility, respiratory phasicity and response to augmentation. Saphenofemoral Junction: No evidence of thrombus. Normal compressibility and flow on color Doppler imaging. Profunda Femoral Vein: No evidence of thrombus. Normal compressibility and flow on color Doppler imaging. Femoral Vein: No evidence of thrombus. Normal compressibility, respiratory phasicity and response to augmentation. Popliteal Vein: No evidence of thrombus. Normal compressibility, respiratory phasicity and response to augmentation. Calf Veins:  Noncompressible peroneal vein. No evidence of color flow within 1 of the paired peroneal veins. The other  peroneal and posterior tibial veins appear patent. Superficial Great Saphenous Vein: No evidence of thrombus. Normal compressibility and flow on color Doppler imaging. Venous Reflux:  None. Other Findings: Complex fluid collection in the popliteal fossa measures 5.3 x 1.5 x 2.6 cm consistent with a Baker's cyst. LEFT LOWER EXTREMITY Common Femoral Vein: No evidence of thrombus. Normal compressibility, respiratory phasicity and response to augmentation. Saphenofemoral Junction: No evidence of thrombus. Normal compressibility and flow on color Doppler imaging. Profunda Femoral Vein: No evidence of thrombus. Normal compressibility and flow on color Doppler imaging. Femoral Vein: No evidence of thrombus. Normal compressibility, respiratory phasicity and response to augmentation. Popliteal Vein: No evidence of thrombus. Normal compressibility, respiratory phasicity and response to augmentation. Calf Veins: No evidence of thrombus. Normal compressibility and flow on color Doppler imaging. Superficial Great Saphenous Vein: No evidence of thrombus. Normal compressibility and flow on color Doppler imaging. Venous Reflux:  None. Other Findings: Complex fluid collection in the popliteal fossa measures 1.7 x 0.4 x 2.0 cm consistent with a Baker's cyst. IMPRESSION: 1. Positive for isolated deep venous thrombosis in 1 of the paired peroneal veins in the right calf. 2. No evidence of deep venous thrombosis in the left lower extremity. 3. Bilateral, right larger than left, Baker's cysts. Electronically Signed   By: Jacqulynn Cadet M.D.   On: 05/15/2016 17:00   US Thyroid  Result Date: 05/15/2016 CLINICAL DATA:  Incidental on CT. 83 year old female with a thyroid mass seen incidentally on recent CT scan of the chest EXAM: THYROID ULTRASOUND TECHNIQUE: Ultrasound examination of the thyroid gland and adjacent soft tissues was performed. COMPARISON:  CT scan of the chest performed earlier today FINDINGS: Parenchymal Echotexture:  Mildly heterogenous Isthmus: 0.4 cm Right lobe: 4.2 x 1.8 x 1.7 cm Left lobe: 4.1 x 1.8 x 1.5 cm _________________________________________________________ Estimated total number of nodules >/= 1 cm: 3 Number of spongiform nodules >/=  2 cm not described below (TR1): 0 Number of mixed cystic and solid nodules >/= 1.5 cm not described below (TR2): 0 _________________________________________________________ Nodule # 1: Location: Right; Mid Maximum size: 1.2 cm; Other 2 dimensions: 1.1 x 1.1 cm Composition: solid/almost completely solid (2) Echogenicity: isoechoic (1) Shape: not taller-than-wide (0) Margins: ill-defined (0) Echogenic foci: punctate echogenic foci (3) ACR TI-RADS total points: 6. ACR TI-RADS risk category: TR4 (4-6 points). ACR TI-RADS recommendations: *Given size (>/= 1 - 1.4 cm) and appearance, a follow-up ultrasound in 1 year should be considered based on TI-RADS criteria. _________________________________________________________ New the 2 cm mass identified on the recent prior CT scan corresponds with a 2.0 x 1.7 x 1.6 cm sonographically simple cyst. No further follow-up is required for this lesion. Similarly, there is a benign spongiform hypoechoic nodule in the left mid gland which measures 1.0 cm. IMPRESSION: 1. The 2 cm mass identified on the recent CT scan corresponds with a sonographically simple and therefore benign 2 cm cyst in the left upper gland. 2. A TI-RADS category 4 nodule in the right mid gland meets criteria for follow-up ultrasound in 1 year (provided the patient is a surgical candidate for thyroidectomy. If the patient is not a surgical candidate then no further follow-up is required). The above is in keeping with the ACR TI-RADS recommendations - J Am Coll Radiol 2017;14:587-595. Electronically Signed   By: Jacqulynn Cadet M.D.   On: 05/15/2016 17:06   ASSESSMENT AND PLAN:  Peter Congo  Villarruel  is a 83 y.o. female with a known history of ST EMI status post drug-eluting stent in LAD  in April 1505, diastolic congestive heart failure, hypertension, known history of left bundle-branch block on EKG comes to the emergency room a cup in by daughter from home with increasing shortness of breath with started past Sunday more so today. Patient in the emergency room underwent workup and CT of the chest was done which shows multiple emboli on the right.  1.Acute respiratory distress secondary to multiple emboli on the right/PE  -Admit to telemetry  -IV heparin drip.---to oral anticoagulation with po eliquis  -Oxygen as needed  -Ultrasound Doppler showed right peroneal vein   2. Coronary artery disease status post stent recent LAD -Continue aspirin and other cardiac meds -Cardiac consultation with Dr. Nehemiah Massed.appreciated. Recommends only asa -Patient since discharge has not been able to pick up prescription for brilinta. Will hold off on it.  3. Hypertension continue home meds  4. Thyroid mass incidentally noted on CT of the chest - ultrasound of the thyroid shows simple cyst  5. DVT prophylaxis on eliquis  6. PT Wanda Davenport worker/Management  Pt will be discharged pending PT consult  D/w son Case discussed with Care Management/Social Worker. Management plans discussed with the patient, family and they are in agreement.  CODE STATUS: FULL  DVT Prophylaxis: Eliquis  TOTAL TIME TAKING CARE OF THIS PATIENT: *30* minutes.  >50% time spent on counselling and coordination of care pt and dter in the room  POSSIBLE D/C IN *1-2* DAYS, DEPENDING ON CLINICAL CONDITION.  Note: This dictation was prepared with Dragon dictation along with smaller phrase technology. Any transcriptional errors that result from this process are unintentional.  Allyn Bertoni M.D on 05/17/2016 at 8:09 AM  Between 7am to 6pm - Pager - [phone]  After 6pm go to [email or web address] - password [email or web address] [facility]  Office  [phone]  CC: Primary care physician; Mellody Dance,  DO

## 2016-05-17 NOTE — Evaluation (Signed)
Physical Therapy Evaluation Patient Details Name: Wanda Davenport MRN: [id number] DOB: 03/25/1923 Today's Date: 05/17/2016   History of Present Illness  Pt is a 83 y.o. female presenting to hospital with SOB and dyspnea on exertion; pt with recent admit with unstable angina.  Pt now admitted with acute hypoxic respiratory failure secondary to multiple R sided PE's; pt also found to have isolated DVT 1 of paired peroneal veins R calf.  PMH includes anxiety, chronic diastolic CHF, htn, LBBB, and breast surgery.  Clinical Impression  Prior to hospital admission, pt was ambulating modified independently with RW.  Pt lives alone but her daughter has recently been staying with her (but is currently in [place]); pt reports she anticipates she will have another family member stay with her until her daughter returns.  Currently pt is modified independent with bed mobility and transfers and supervision with ambulation 190 feet with RW.  Pt steady without loss of balance using RW during session (pt appearing unsteady attempting to ambulate without assistive device and needing to hold onto furniture in room).  Pt's O2 sats 94% on room air resting in bed beginning of session, 92% after ambulating about 90 feet, and 86% at end of ambulation (after resting in chair with vc's for pursed lip breathing, pt's O2 sats increased back to 94% within a couple minutes); nursing notified of pt's O2 sats during session.  Pt would benefit from skilled PT to address noted impairments and functional limitations (see below for any additional details).  Upon hospital discharge, recommend pt discharge to home with HHPT and support of family.    Follow Up Recommendations Home health PT    Equipment Recommendations  Rolling walker with 5" wheels    Recommendations for Other Services       Precautions / Restrictions Precautions Precautions: Fall Restrictions Weight Bearing Restrictions: No      Mobility  Bed Mobility Overal  bed mobility: Modified Independent Bed Mobility: Supine to Sit           General bed mobility comments: bed flat; no difficulties noted  Transfers   Equipment used: Rolling walker (2 wheeled) Transfers: Sit to/from Stand Sit to Stand: Modified independent (Device/Increase time)         General transfer comment: steady safe transfers without any difficulties  Ambulation/Gait Ambulation/Gait assistance: Supervision Ambulation Distance (Feet): 190 Feet Assistive device: Rolling walker (2 wheeled) Gait Pattern/deviations: WFL(Within Functional Limits)   Gait velocity interpretation: at or above normal speed for age/gender General Gait Details: steady without loss of balance using RW; mild SOB noted with distance  Stairs            Wheelchair Mobility    Modified Rankin (Stroke Patients Only)       Balance Overall balance assessment: No apparent balance deficits (not formally assessed) Sitting-balance support: No upper extremity supported;Feet supported Sitting balance-Leahy Scale: Normal     Standing balance support: Bilateral upper extremity supported;During functional activity (on RW) Standing balance-Leahy Scale: Good Standing balance comment: with ambulation and turns                             Pertinent Vitals/Pain Pain Assessment: No/denies pain  HR WFL during session.    Home Living Family/patient expects to be discharged to:: Private residence Living Arrangements: Children (Pt's daughter has been staying with pt but is currently in [place]) Available Help at Discharge: Family Type of Home: House Home Access: Stairs to enter  Entrance Stairs-Rails: None Entrance Stairs-Number of Steps: 2 steps to enter with L wall for support Home Layout: One level Home Equipment: Walker - 2 wheels;[place] held shower head      Prior Function Level of Independence: Independent with assistive device(s)         Comments: Pt using RW  since recent discharge from [facility].     Hand Dominance        Extremity/Trunk Assessment   Upper Extremity Assessment Upper Extremity Assessment: Overall WFL for tasks assessed    Lower Extremity Assessment Lower Extremity Assessment: Overall WFL for tasks assessed    Cervical / Trunk Assessment Cervical / Trunk Assessment: Normal  Communication   Communication: HOH  Cognition Arousal/Alertness: Awake/alert Behavior During Therapy: WFL for tasks assessed/performed Overall Cognitive Status: Within Functional Limits for tasks assessed                                        General Comments  Pt agreeable to PT session.  Pt's son came end of session.    Exercises     Assessment/Plan    PT Assessment Patient needs continued PT services  PT Problem List Cardiopulmonary status limiting activity;Decreased balance       PT Treatment Interventions DME instruction;Gait training;Stair training;Functional mobility training;Therapeutic activities;Therapeutic exercise;Balance training;Patient/family education    PT Goals (Current goals can be found in the Care Plan section)  Acute Rehab PT Goals Patient Stated Goal: to go home PT Goal Formulation: With patient Time For Goal Achievement: 05/31/16 Potential to Achieve Goals: Good    Frequency Min 2X/week   Barriers to discharge        Co-evaluation               AM-PAC PT "6 Clicks" Daily Activity  Outcome Measure Difficulty turning over in bed (including adjusting bedclothes, sheets and blankets)?: None Difficulty moving from lying on back to sitting on the side of the bed? : None Difficulty sitting down on and standing up from a chair with arms (e.g., wheelchair, bedside commode, etc,.)?: None Help needed moving to and from a bed to chair (including a wheelchair)?: A Little Help needed walking in hospital room?: A Little Help needed climbing 3-5 steps with a railing? : A Little 6 Click Score:  21    End of Session Equipment Utilized During Treatment: Gait belt Activity Tolerance: Patient tolerated treatment well Patient left: in chair;with call bell/phone within reach;with chair alarm set;with family/visitor present Nurse Communication: Mobility status;Precautions (Pt's O2 sats during session) PT Visit Diagnosis: Other abnormalities of gait and mobility (R26.89)    Time: 8280-0349 PT Time Calculation (min) (ACUTE ONLY): 25 min   Charges:   PT Evaluation $PT Eval Low Complexity: 1 Procedure     PT G CodesLeitha Bleak, PT 05/17/16, 10:35 AM [phone]

## 2016-05-17 NOTE — Progress Notes (Signed)
Discharge instructions given to patient and family. Verbalized understanding. No distress at this time. IVs and tele removed from patient. Family at bedside and will be transporting patient home.

## 2016-05-17 NOTE — Clinical Social Work Note (Signed)
CSW received referral for SNF.  Case discussed with case manager and plan is to discharge home with home health.  CSW to sign off please re-consult if social work needs arise.  Shadi Sessler R. Tyrick Dunagan, MSW, LCSWA [phone]  

## 2016-05-17 NOTE — Progress Notes (Signed)
SATURATION QUALIFICATIONS: (This note is used to comply with regulatory documentation for home oxygen)  Patient Saturations on Room Air at Rest = 96%  Patient Saturations on Room Air while Ambulating = 94%  Patient Saturations on 0 Liters of oxygen while Ambulating = 94%  Please briefly explain why patient needs home oxygen:  Does not qualify

## 2016-05-17 NOTE — Telephone Encounter (Signed)
Needs to get from her cardiologist.

## 2016-05-17 NOTE — Telephone Encounter (Signed)
Pt has been hospitalized multiple times since last OV and I am unsure if refill is appropriate or if pt needs f/u OV.  Charyl Bigger, CMA

## 2016-05-18 ENCOUNTER — Other Ambulatory Visit: Payer: Self-pay | Admitting: *Deleted

## 2016-05-18 ENCOUNTER — Encounter: Payer: Self-pay | Admitting: *Deleted

## 2016-05-18 DIAGNOSIS — I2102 ST elevation (STEMI) myocardial infarction involving left anterior descending coronary artery: Secondary | ICD-10-CM | POA: Diagnosis not present

## 2016-05-18 DIAGNOSIS — I350 Nonrheumatic aortic (valve) stenosis: Secondary | ICD-10-CM | POA: Diagnosis not present

## 2016-05-18 DIAGNOSIS — I429 Cardiomyopathy, unspecified: Secondary | ICD-10-CM | POA: Diagnosis not present

## 2016-05-18 DIAGNOSIS — I447 Left bundle-branch block, unspecified: Secondary | ICD-10-CM | POA: Diagnosis not present

## 2016-05-18 DIAGNOSIS — I5032 Chronic diastolic (congestive) heart failure: Secondary | ICD-10-CM | POA: Diagnosis not present

## 2016-05-18 DIAGNOSIS — M8580 Other specified disorders of bone density and structure, unspecified site: Secondary | ICD-10-CM | POA: Diagnosis not present

## 2016-05-18 DIAGNOSIS — R7303 Prediabetes: Secondary | ICD-10-CM | POA: Diagnosis not present

## 2016-05-18 DIAGNOSIS — I110 Hypertensive heart disease with heart failure: Secondary | ICD-10-CM | POA: Diagnosis not present

## 2016-05-18 DIAGNOSIS — K5730 Diverticulosis of large intestine without perforation or abscess without bleeding: Secondary | ICD-10-CM | POA: Diagnosis not present

## 2016-05-18 NOTE — Patient Outreach (Signed)
[facility]) Care Management  05/18/2016  Wanda Davenport [birth year] [id number]  Transition of care call   Referral received from inpatient care management on 5/17 Patient admitted 5/15  Acute respiratory distress  Right side pulmonary embolus, recent admission for STEMI with stent to LAD ,  Diastolic heart failure.  Discharged 5/17 to home .   Placed call to patient home number to engage for [facility] care management services post discharge, no answer able to leave hipaa compliant message requesting a return call.   Placed call to patients' son Wanda Davenport listed as emergency contact, successful he was actually with patient at her home, they have received [facility] and awaiting visit on today. Spoke with patient explained [facility] care management services, patient gave verbal agreement to program.  Patient discussed she is doing pretty good on today,denies increased shortness of breath at present she has used albuterol inhaler once since being at home,patient denies chest pain.  Patient states  was able to dress herself on today, prepared her own breakfast of oatmeal. Patient has walker for use at home and states she is able to get around home okay. Reviewed medication with son , and she states they have picked up new prescription for apixaban and placed on medication in a pill organizer. Son discussed they used coupon provided from hospital for apixaban at no cost. Patient was recently discharged from hospital and all medications have been reviewed. Patient has scales encourage to weigh daily. Discussed benefit of [employer] dine meals, patient has declined.  Patient acknowledges PCP visit on 5/22 and her daughter will provide transportation.   Plan Will follow patient for transition of care program, next call in a week. Will send PCP involvement barrier letter and welcome letter to patient . Patient will notify MD of new symptoms of chest pain , shortness of  breath , seek emergency care.      [name], RN, [facility] [phone]- Mobile [phone]- Toll Free Main Office

## 2016-05-18 NOTE — Telephone Encounter (Signed)
Pt informed.  T. Nelson, CMA 

## 2016-05-19 ENCOUNTER — Encounter: Payer: Self-pay | Admitting: Emergency Medicine

## 2016-05-19 ENCOUNTER — Inpatient Hospital Stay
Admission: EM | Admit: 2016-05-19 | Discharge: 2016-05-21 | DRG: 291 | Disposition: A | Discharge status: Home Health | Payer: Medicare HMO | Attending: Internal Medicine | Admitting: Internal Medicine

## 2016-05-19 ENCOUNTER — Emergency Department: Payer: Medicare HMO

## 2016-05-19 DIAGNOSIS — Z955 Presence of coronary angioplasty implant and graft: Secondary | ICD-10-CM

## 2016-05-19 DIAGNOSIS — Z882 Allergy status to sulfonamides status: Secondary | ICD-10-CM | POA: Diagnosis not present

## 2016-05-19 DIAGNOSIS — N183 Chronic kidney disease, stage 3 (moderate): Secondary | ICD-10-CM | POA: Diagnosis present

## 2016-05-19 DIAGNOSIS — I252 Old myocardial infarction: Secondary | ICD-10-CM

## 2016-05-19 DIAGNOSIS — I447 Left bundle-branch block, unspecified: Secondary | ICD-10-CM | POA: Diagnosis not present

## 2016-05-19 DIAGNOSIS — M8580 Other specified disorders of bone density and structure, unspecified site: Secondary | ICD-10-CM | POA: Diagnosis present

## 2016-05-19 DIAGNOSIS — I440 Atrioventricular block, first degree: Secondary | ICD-10-CM | POA: Diagnosis present

## 2016-05-19 DIAGNOSIS — I130 Hypertensive heart and chronic kidney disease with heart failure and stage 1 through stage 4 chronic kidney disease, or unspecified chronic kidney disease: Principal | ICD-10-CM | POA: Diagnosis present

## 2016-05-19 DIAGNOSIS — E785 Hyperlipidemia, unspecified: Secondary | ICD-10-CM | POA: Diagnosis present

## 2016-05-19 DIAGNOSIS — I5043 Acute on chronic combined systolic (congestive) and diastolic (congestive) heart failure: Secondary | ICD-10-CM | POA: Diagnosis not present

## 2016-05-19 DIAGNOSIS — Z79899 Other long term (current) drug therapy: Secondary | ICD-10-CM

## 2016-05-19 DIAGNOSIS — I110 Hypertensive heart disease with heart failure: Secondary | ICD-10-CM | POA: Diagnosis not present

## 2016-05-19 DIAGNOSIS — I2699 Other pulmonary embolism without acute cor pulmonale: Secondary | ICD-10-CM | POA: Diagnosis not present

## 2016-05-19 DIAGNOSIS — I10 Essential (primary) hypertension: Secondary | ICD-10-CM | POA: Diagnosis present

## 2016-05-19 DIAGNOSIS — I2510 Atherosclerotic heart disease of native coronary artery without angina pectoris: Secondary | ICD-10-CM | POA: Diagnosis not present

## 2016-05-19 DIAGNOSIS — Z86711 Personal history of pulmonary embolism: Secondary | ICD-10-CM

## 2016-05-19 DIAGNOSIS — Z7982 Long term (current) use of aspirin: Secondary | ICD-10-CM

## 2016-05-19 DIAGNOSIS — F411 Generalized anxiety disorder: Secondary | ICD-10-CM | POA: Diagnosis present

## 2016-05-19 DIAGNOSIS — Z888 Allergy status to other drugs, medicaments and biological substances status: Secondary | ICD-10-CM

## 2016-05-19 DIAGNOSIS — Z87891 Personal history of nicotine dependence: Secondary | ICD-10-CM | POA: Diagnosis not present

## 2016-05-19 DIAGNOSIS — Z7901 Long term (current) use of anticoagulants: Secondary | ICD-10-CM | POA: Diagnosis not present

## 2016-05-19 DIAGNOSIS — J9601 Acute respiratory failure with hypoxia: Secondary | ICD-10-CM

## 2016-05-19 DIAGNOSIS — I509 Heart failure, unspecified: Secondary | ICD-10-CM

## 2016-05-19 DIAGNOSIS — Z86718 Personal history of other venous thrombosis and embolism: Secondary | ICD-10-CM

## 2016-05-19 DIAGNOSIS — I5023 Acute on chronic systolic (congestive) heart failure: Secondary | ICD-10-CM | POA: Diagnosis not present

## 2016-05-19 DIAGNOSIS — R0602 Shortness of breath: Secondary | ICD-10-CM | POA: Diagnosis not present

## 2016-05-19 DIAGNOSIS — M1990 Unspecified osteoarthritis, unspecified site: Secondary | ICD-10-CM | POA: Diagnosis present

## 2016-05-19 DIAGNOSIS — K760 Fatty (change of) liver, not elsewhere classified: Secondary | ICD-10-CM | POA: Diagnosis present

## 2016-05-19 LAB — BASIC METABOLIC PANEL
Anion gap: 8 (ref 5–15)
BUN: 26 mg/dL — AB (ref 6–20)
CALCIUM: 9.9 mg/dL (ref 8.9–10.3)
CHLORIDE: 102 mmol/L (ref 101–111)
CO2: 22 mmol/L (ref 22–32)
CREATININE: 1 mg/dL (ref 0.44–1.00)
GFR calc Af Amer: 55 mL/min — ABNORMAL LOW (ref >60)
GFR calc non Af Amer: 48 mL/min — ABNORMAL LOW (ref >60)
Glucose, Bld: 122 mg/dL — ABNORMAL HIGH (ref 65–99)
Potassium: 4.5 mmol/L (ref 3.5–5.1)
Sodium: 132 mmol/L — ABNORMAL LOW (ref 135–145)

## 2016-05-19 LAB — CBC WITH DIFFERENTIAL/PLATELET
Basophils Absolute: 0 10*3/uL (ref 0–0.1)
Basophils Relative: 0 %
Eosinophils Absolute: 0.5 10*3/uL (ref 0–0.7)
Eosinophils Relative: 4 %
HEMATOCRIT: 34 % — AB (ref 35.0–47.0)
HEMOGLOBIN: 11.3 g/dL — AB (ref 12.0–16.0)
Lymphocytes Relative: 11 %
Lymphs Abs: 1.5 10*3/uL (ref 1.0–3.6)
MCH: 29.1 pg (ref 26.0–34.0)
MCHC: 33.4 g/dL (ref 32.0–36.0)
MCV: 87.2 fL (ref 80.0–100.0)
MONO ABS: 0.8 10*3/uL (ref 0.2–0.9)
MONOS PCT: 6 %
NEUTROS ABS: 10.2 10*3/uL — AB (ref 1.4–6.5)
NEUTROS PCT: 79 %
Platelets: 224 10*3/uL (ref 150–440)
RBC: 3.9 MIL/uL (ref 3.80–5.20)
RDW: 15.5 % — AB (ref 11.5–14.5)
WBC: 13.2 10*3/uL — ABNORMAL HIGH (ref 3.6–11.0)

## 2016-05-19 LAB — BRAIN NATRIURETIC PEPTIDE: B Natriuretic Peptide: 1575 pg/mL — ABNORMAL HIGH (ref 0.0–100.0)

## 2016-05-19 LAB — TROPONIN I: TROPONIN I: 0.05 ng/mL — AB (ref <0.03)

## 2016-05-19 MED ORDER — FUROSEMIDE 10 MG/ML IJ SOLN
60.0000 mg | Freq: Once | INTRAMUSCULAR | Status: AC
  Administered 2016-05-19: 60 mg via INTRAVENOUS
  Filled 2016-05-19: qty 8

## 2016-05-19 NOTE — H&P (Signed)
[facility] NAME: Wanda Davenport    MR#:  [id number]  DATE OF BIRTH:  [birth year]  DATE OF ADMISSION:  05/19/2016  PRIMARY CARE PHYSICIAN: Mellody Dance, DO   REQUESTING/REFERRING PHYSICIAN: Quentin Cornwall, M.D.  CHIEF COMPLAINT:   Chief Complaint  Patient presents with  . Shortness of Breath    Pt. here from home via EMS.    HISTORY OF PRESENT ILLNESS:  Wanda Davenport  is a 83 y.o. female who presents with Onset of shortness of breath today. Patient states that she feels like she's also been having some increasing abdominal swelling. She recently had myocardial infarction. Workup here in the ED today suggests heart failure. Hospitalists were called for admission  PAST MEDICAL HISTORY:   Past Medical History:  Diagnosis Date  . Allergic rhinitis   . Anxiety   . Arthritis   . Atypical chest pain   . Bronchitis 01/14/2016  . Chronic diastolic CHF (congestive heart failure) (HCC)    Echo 06/2010 EF 60-65%, no RWMAs, grade 1 diastolic dysfunction, mild LAE, PASP 4mmHg.  Marland Kitchen DIVERTICULOSIS, COLON   . HEMORRHOIDS, INTERNAL   . Hepatic steatosis   . Hyperlipidemia   . HYPERTENSION   . LBBB (left bundle branch block)    a. present on ECG 06/2010  . Osteopenia   . Pre-diabetes    A1c 6.0    PAST SURGICAL HISTORY:   Past Surgical History:  Procedure Laterality Date  . ABDOMINAL HYSTERECTOMY    . BREAST SURGERY    . CATARACT EXTRACTION W/PHACO Left 05/12/2014   Procedure: CATARACT EXTRACTION PHACO AND INTRAOCULAR LENS PLACEMENT (IOC);  Surgeon: Leandrew Koyanagi, MD;  Location: [facility];  Service: Ophthalmology;  Laterality: Left;  . COLONOSCOPY     a. 2010  . CORONARY STENT INTERVENTION N/A 04/26/2016   Procedure: Coronary Stent Intervention;  Surgeon: Isaias Cowman, MD;  Location: [facility];  Service: Cardiovascular;  Laterality: N/A;  . LEFT HEART CATH AND CORONARY ANGIOGRAPHY N/A 04/26/2016    Procedure: Left Heart Cath and Coronary Angiography;  Surgeon: Isaias Cowman, MD;  Location: [facility];  Service: Cardiovascular;  Laterality: N/A;  . LEFT HEART CATH AND CORONARY ANGIOGRAPHY N/A 05/05/2016   Procedure: Left Heart Cath and Coronary Angiography;  Surgeon: Wellington Hampshire, MD;  Location: [facility];  Service: Cardiovascular;  Laterality: N/A;  . TONSILLECTOMY      SOCIAL HISTORY:   Social History  Substance Use Topics  . Smoking status: Former Smoker    Packs/day: 0.25    Years: 20.00    Types: Cigarettes    Quit date: 01/02/1971  . Smokeless tobacco: Never Used     Comment: smoked a few cigarettes/day x 20 yrs - quit @ age 34.  Marland Kitchen Alcohol use No    FAMILY HISTORY:   Family History  Problem Relation Age of Onset  . Colon cancer Sister   . Melanoma Sister   . Hyperlipidemia Brother   . Hypertension Mother   . Lupus Mother        died 95 from surgical complications  . Alzheimer's disease Brother        unknown  . Kidney disease Father        died 54  . Hypertension Father   . Alcohol abuse Father     DRUG ALLERGIES:   Allergies  Allergen Reactions  . Fenofibrate Other (See Comments)    Myalgias/gi upset   .  Statins Other (See Comments)    Myalgias   . Sulfonamide Derivatives Nausea And Vomiting    REACTION: nausea    MEDICATIONS AT HOME:   Prior to Admission medications   Medication Sig Start Date End Date Taking? Authorizing Provider  albuterol (PROVENTIL HFA;VENTOLIN HFA) 108 (90 BASE) MCG/ACT inhaler Inhale 2 puffs into the lungs every 6 (six) hours as needed. Wheezing or shortness of breath    [provider]  ALPRAZolam (XANAX) 0.25 MG tablet Take 1 tablet (0.25 mg total) by mouth 2 (two) times daily as needed for anxiety. 04/30/16   Fritzi Mandes, MD  apixaban (ELIQUIS) 5 MG TABS tablet Take 10 mg (2 tabs) twice a day till 05/22/16 and then from 05/23/16 take 1 tab (5 mg ) two times a day 05/17/16   Fritzi Mandes, MD  aspirin 325 MG EC tablet Take 325 mg by mouth daily.    [provider]  bisoprolol (ZEBETA) 10 MG tablet Take 1 tablet (10 mg total) by mouth daily. 03/29/16   Lillard Anes D, NP  Calcium Carbonate-Vitamin D 600-400 MG-UNIT tablet Take 2 tablets by mouth daily. Patient taking differently: Take 1 tablet by mouth 2 (two) times daily.  01/25/16   Opalski, Deborah, DO  escitalopram (LEXAPRO) 10 MG tablet TAKE 1 TABLET (10 MG TOTAL) BY MOUTH DAILY. 04/27/16   Opalski, Deborah, DO  fluticasone (FLONASE) 50 MCG/ACT nasal spray Place 2 sprays into the nose daily. Nasal congestion Rarely used     [provider]  furosemide (LASIX) 20 MG tablet Take 1 tablet (20 mg total) by mouth daily. Patient not taking: Reported on 05/19/2016 05/07/16   Bettey Costa, MD  isosorbide mononitrate (IMDUR) 30 MG 24 hr tablet Take 1 tablet (30 mg total) by mouth daily. 04/30/16   Fritzi Mandes, MD  lisinopril (PRINIVIL,ZESTRIL) 20 MG tablet Take 1 tablet by mouth daily. 02/27/16 02/26/17  [provider]  naphazoline-pheniramine (NAPHCON-A) 0.025-0.3 % ophthalmic solution Place 1 drop into both eyes 4 (four) times daily as needed for irritation.    [provider]  nitroGLYCERIN (NITROSTAT) 0.3 MG SL tablet Place 0.3 mg under the tongue every 5 (five) minutes as needed for chest pain.    [provider]    REVIEW OF SYSTEMS:  Review of Systems  Constitutional: Negative for chills, fever, malaise/fatigue and weight loss.  HENT: Negative for ear pain, hearing loss and tinnitus.   Eyes: Negative for blurred vision, double vision, pain and redness.  Respiratory: Positive for shortness of breath. Negative for cough and hemoptysis.   Cardiovascular: Positive for leg swelling. Negative for chest pain, palpitations and orthopnea.  Gastrointestinal: Negative for abdominal pain, constipation, diarrhea, nausea and vomiting.  Genitourinary: Negative for dysuria, frequency and hematuria.   Musculoskeletal: Negative for back pain, joint pain and neck pain.  Skin:       No acne, rash, or lesions  Neurological: Negative for dizziness, tremors, focal weakness and weakness.  Endo/Heme/Allergies: Negative for polydipsia. Does not bruise/bleed easily.  Psychiatric/Behavioral: Negative for depression. The patient is not nervous/anxious and does not have insomnia.      VITAL SIGNS:   Vitals:   05/19/16 2128 05/19/16 2132 05/19/16 2133 05/19/16 2236  BP:  136/71  139/68  Pulse:  68  67  Resp:  (!) 21  (!) 22  Temp:  97.8 F (36.6 C)    TempSrc:  Oral    SpO2: 96% 98%  (!) 87%  Weight:   73 kg (161  lb)   Height:   5\' 1"  (1.549 m)    Wt Readings from Last 3 Encounters:  05/19/16 73 kg (161 lb)  05/15/16 73 kg (161 lb)  05/05/16 75.9 kg (167 lb 5.3 oz)    PHYSICAL EXAMINATION:  Physical Exam  Vitals reviewed. Constitutional: She is oriented to person, place, and time. She appears well-developed and well-nourished. No distress.  HENT:  Head: Normocephalic and atraumatic.  Mouth/Throat: Oropharynx is clear and moist.  Eyes: Conjunctivae and EOM are normal. Pupils are equal, round, and reactive to light. No scleral icterus.  Neck: Normal range of motion. Neck supple. No JVD present. No thyromegaly present.  Cardiovascular: Normal rate, regular rhythm and intact distal pulses.  Exam reveals no gallop and no friction rub.   No murmur heard. Respiratory: Effort normal. No respiratory distress. She has no wheezes. She has rales (Mild, bibasilar).  GI: Soft. Bowel sounds are normal. She exhibits distension (Mild). There is no tenderness.  Musculoskeletal: Normal range of motion. She exhibits edema (Mild bilateral lower extremities).  No arthritis, no gout  Lymphadenopathy:    She has no cervical adenopathy.  Neurological: She is alert and oriented to person, place, and time. No cranial nerve deficit.  No dysarthria, no aphasia  Skin: Skin is warm and dry. No rash noted. No  erythema.  Psychiatric: She has a normal mood and affect. Her behavior is normal. Judgment and thought content normal.    LABORATORY PANEL:   CBC  Recent Labs Lab 05/19/16 2209  WBC 13.2*  HGB 11.3*  HCT 34.0*  PLT 224   ------------------------------------------------------------------------------------------------------------------  Chemistries   Recent Labs Lab 05/15/16 0700 05/19/16 2209  NA 137 132*  K 4.3 4.5  CL 107 102  CO2 21* 22  GLUCOSE 106* 122*  BUN 36* 26*  CREATININE 1.15* 1.00  CALCIUM 10.0 9.9  AST 17  --   ALT 10*  --   ALKPHOS 76  --   BILITOT 0.7  --    ------------------------------------------------------------------------------------------------------------------  Cardiac Enzymes  Recent Labs Lab 05/19/16 2209  TROPONINI 0.05*   ------------------------------------------------------------------------------------------------------------------  RADIOLOGY:  Dg Chest 2 View  Result Date: 05/19/2016 CLINICAL DATA:  Shortness of breath, PE and DVT, on Eliquis EXAM: CHEST  2 VIEW COMPARISON:  CTA chest dated 05/15/2016 FINDINGS: Small to moderate left pleural effusion. Associated left lower lobe opacity, likely atelectasis. Small right pleural effusion, best visualized on the lateral view. Pulmonary vascular congestion. Very mild interstitial edema is suspected. Cardiomegaly. IMPRESSION: Suspected mild interstitial edema. Small to moderate left pleural effusion. Associated left lower lobe opacity, likely atelectasis. Small right pleural effusion. Electronically Signed   By: Julian Hy M.D.   On: 05/19/2016 21:58    EKG:   Orders placed or performed during the hospital encounter of 05/19/16  . EKG 12-Lead  . EKG 12-Lead    IMPRESSION AND PLAN:  Principal Problem:   Acute on chronic combined systolic and diastolic CHF (congestive heart failure) (HCC) - IV Lasix given in the ED with some improvement in patient's respiratory status,  cardiology consult Active Problems:   HTN (hypertension) - continue home meds   PE (pulmonary thromboembolism) ([place]) - continue anticoagulation   Hyperlipidemia - home meds   GAD (generalized anxiety disorder) - home dose anxiolytics  All the records are reviewed and case discussed with ED provider. Management plans discussed with the patient and/or family.  DVT PROPHYLAXIS: Systemic anticoagulation  GI PROPHYLAXIS: None  ADMISSION STATUS: Inpatient  CODE STATUS: Full Code  Status History    Date Active Date Inactive Code Status Order ID Comments User Context   05/15/2016 12:34 PM 05/17/2016  6:08 PM Full Code [id number]  Fritzi Mandes, MD Inpatient   05/05/2016  3:31 AM 05/06/2016  3:28 PM Full Code [id number]  Wellington Hampshire, MD Inpatient   04/26/2016  5:12 PM 05/01/2016  3:49 AM Full Code [id number]  Nicholes Mango, MD Inpatient   04/26/2016  2:08 PM 04/26/2016  5:12 PM Full Code [id number]  Isaias Cowman, MD Inpatient   09/23/2011  2:11 PM 09/26/2011  4:43 PM Full Code [id number]  Willette Pa, RN Inpatient      TOTAL TIME TAKING CARE OF THIS PATIENT: 45 minutes.   Kaedance Magos Crowder 05/19/2016, 11:53 PM  [facility]  Office  [phone]  CC: Primary care physician; Mellody Dance, DO  Note:  This document was prepared using Dragon voice recognition software and may include unintentional dictation errors.

## 2016-05-19 NOTE — ED Notes (Signed)
Pt. Walked about 20 steps in room with no assistance.  Pt. O2 dropped down to 87 after exercise.

## 2016-05-19 NOTE — ED Provider Notes (Signed)
[facility] Emergency Department Provider Note    First MD Initiated Contact with Patient 05/19/16 2129     (approximate)  I have reviewed the triage vital signs and the nursing notes.   HISTORY  Chief Complaint Shortness of Breath (Pt. here from home via EMS.)    HPI Wanda Davenport is a 83 y.o. female presents from home with shortness of breath. Patient with a history of congestive heart failure as well as recent diagnosis of pulmonary embolism on [place]. Was initially on Lasix but this was stopped during her admission to the hospital previously. States that she has noted worsening swelling in her legs. Denies any chest pains. No cough. Has been compliant with her medications. Does not wear any home oxygen. Patient was placed on nonrebreather by EMS due to shortness of breath but no significant measured hypoxia from their report.   Past Medical History:  Diagnosis Date  . Allergic rhinitis   . Anxiety   . Arthritis   . Atypical chest pain   . Bronchitis 01/14/2016  . Chronic diastolic CHF (congestive heart failure) (HCC)    Echo 06/2010 EF 60-65%, no RWMAs, grade 1 diastolic dysfunction, mild LAE, PASP 57mmHg.  Marland Kitchen DIVERTICULOSIS, COLON   . HEMORRHOIDS, INTERNAL   . Hepatic steatosis   . Hyperlipidemia   . HYPERTENSION   . LBBB (left bundle branch block)    a. present on ECG 06/2010  . Osteopenia   . Pre-diabetes    A1c 6.0   Family History  Problem Relation Age of Onset  . Colon cancer Sister   . Melanoma Sister   . Hyperlipidemia Brother   . Hypertension Mother   . Lupus Mother        died 49 from surgical complications  . Alzheimer's disease Brother        unknown  . Kidney disease Father        died 16  . Hypertension Father   . Alcohol abuse Father    Past Surgical History:  Procedure Laterality Date  . ABDOMINAL HYSTERECTOMY    . BREAST SURGERY    . CATARACT EXTRACTION W/PHACO Left 05/12/2014   Procedure: CATARACT EXTRACTION  PHACO AND INTRAOCULAR LENS PLACEMENT (IOC);  Surgeon: Leandrew Koyanagi, MD;  Location: [facility];  Service: Ophthalmology;  Laterality: Left;  . COLONOSCOPY     a. 2010  . CORONARY STENT INTERVENTION N/A 04/26/2016   Procedure: Coronary Stent Intervention;  Surgeon: Isaias Cowman, MD;  Location: [facility];  Service: Cardiovascular;  Laterality: N/A;  . LEFT HEART CATH AND CORONARY ANGIOGRAPHY N/A 04/26/2016   Procedure: Left Heart Cath and Coronary Angiography;  Surgeon: Isaias Cowman, MD;  Location: [facility] [facility];  Service: Cardiovascular;  Laterality: N/A;  . LEFT HEART CATH AND CORONARY ANGIOGRAPHY N/A 05/05/2016   Procedure: Left Heart Cath and Coronary Angiography;  Surgeon: Wellington Hampshire, MD;  Location: [facility];  Service: Cardiovascular;  Laterality: N/A;  . TONSILLECTOMY     Patient Active Problem List   Diagnosis Date Noted  . Diastolic CHF, acute on chronic ([place]) 05/15/2016  . Acute on chronic systolic (congestive) heart failure ([place]) 05/05/2016  . Unstable angina ([place])   . Acute ST elevation myocardial infarction (STEMI) involving left anterior descending (LAD) coronary artery ([place]) 04/26/2016  . STEMI (ST elevation myocardial infarction) ([place]) 04/26/2016  . Knee pain, bilateral 04/10/2016  . Medication monitoring encounter 01/26/2016  . Intolerance of drug- statins 01/25/2016  .  Counseling regarding end of life decision making 01/25/2016  . Hypertriglyceridemia 12/13/2015  . Low serum HDL 12/13/2015  . B12 deficiency 12/13/2015  . Claudication of left lower extremity ([place]) 12/13/2015  . Vitamin D deficiency 11/06/2015  . Adjustment disorder with mixed anxiety and depressed mood 11/06/2015  . Basal cell carcinoma 10/31/2015  . Environmental and seasonal allergies 10/31/2015  . Reactive airway disease- only spring and fall due to allergies 10/31/2015  . GAD (generalized anxiety disorder) 10/13/2015  . Atypical chest pain  04/16/2011  . Chronic diastolic heart failure ([place]) 04/16/2011  . Hyperlipidemia   . Pre-diabetes   . chronic LBBB (left bundle branch block)- since 2012   . Generalized OA   . Allergic rhinitis   . DIVERTICULOSIS, COLON 12/03/2007  . Hypertension with CHF ( diastolic dysf grade I)  70/35/0093      Prior to Admission medications   Medication Sig Start Date End Date Taking? Authorizing Provider  albuterol (PROVENTIL HFA;VENTOLIN HFA) 108 (90 BASE) MCG/ACT inhaler Inhale 2 puffs into the lungs every 6 (six) hours as needed. Wheezing or shortness of breath    [provider]  ALPRAZolam (XANAX) 0.25 MG tablet Take 1 tablet (0.25 mg total) by mouth 2 (two) times daily as needed for anxiety. 04/30/16   Fritzi Mandes, MD  apixaban (ELIQUIS) 5 MG TABS tablet Take 10 mg (2 tabs) twice a day till 05/22/16 and then from 05/23/16 take 1 tab (5 mg ) two times a day 05/17/16   Fritzi Mandes, MD  aspirin 325 MG EC tablet Take 325 mg by mouth daily.    [provider]  bisoprolol (ZEBETA) 10 MG tablet Take 1 tablet (10 mg total) by mouth daily. 03/29/16   Lillard Anes D, NP  Calcium Carbonate-Vitamin D 600-400 MG-UNIT tablet Take 2 tablets by mouth daily. Patient taking differently: Take 1 tablet by mouth 2 (two) times daily.  01/25/16   Opalski, Deborah, DO  escitalopram (LEXAPRO) 10 MG tablet TAKE 1 TABLET (10 MG TOTAL) BY MOUTH DAILY. 04/27/16   Opalski, Deborah, DO  fluticasone (FLONASE) 50 MCG/ACT nasal spray Place 2 sprays into the nose daily. Nasal congestion Rarely used     [provider]  furosemide (LASIX) 20 MG tablet Take 1 tablet (20 mg total) by mouth daily. Patient not taking: Reported on 05/19/2016 05/07/16   Bettey Costa, MD  isosorbide mononitrate (IMDUR) 30 MG 24 hr tablet Take 1 tablet (30 mg total) by mouth daily. 04/30/16   Fritzi Mandes, MD  lisinopril (PRINIVIL,ZESTRIL) 20 MG tablet Take 1 tablet by mouth daily. 02/27/16 02/26/17  [provider]    naphazoline-pheniramine (NAPHCON-A) 0.025-0.3 % ophthalmic solution Place 1 drop into both eyes 4 (four) times daily as needed for irritation.    [provider]  nitroGLYCERIN (NITROSTAT) 0.3 MG SL tablet Place 0.3 mg under the tongue every 5 (five) minutes as needed for chest pain.    [provider]    Allergies Fenofibrate; Statins; and Sulfonamide derivatives    Social History Social History  Substance Use Topics  . Smoking status: Former Smoker    Packs/day: 0.25    Years: 20.00    Types: Cigarettes    Quit date: 01/02/1971  . Smokeless tobacco: Never Used     Comment: smoked a few cigarettes/day x 20 yrs - quit @ age 84.  Marland Kitchen Alcohol use No    Review of Systems Patient denies headaches, rhinorrhea, blurry vision, numbness, shortness of breath, chest pain, edema, cough,  abdominal pain, nausea, vomiting, diarrhea, dysuria, fevers, rashes or hallucinations unless otherwise stated above in HPI. ____________________________________________   PHYSICAL EXAM:  VITAL SIGNS: Vitals:   05/19/16 2132 05/19/16 2236  BP: 136/71 139/68  Pulse: 68 67  Resp: (!) 21 (!) 22  Temp: 97.8 F (36.6 C)     Constitutional: Alert and oriented. Well appearing and in no acute distress. Eyes: Conjunctivae are normal. PERRL. EOMI. Head: Atraumatic. Nose: No congestion/rhinnorhea. Mouth/Throat: Mucous membranes are moist.  Oropharynx non-erythematous. Neck: No stridor. Painless ROM. No cervical spine tenderness to palpation Hematological/Lymphatic/Immunilogical: No cervical lymphadenopathy. Cardiovascular: Normal rate, regular rhythm. Grossly normal heart sounds.  Good peripheral circulation. Respiratory: inspiratory crackles in posterior lung fields, + use of accessory muscles Gastrointestinal: Soft and nontender. No distention. No abdominal bruits. No CVA tenderness. Musculoskeletal: No lower extremity tenderness nor edema.  No joint effusions. Neurologic:  Normal speech  and language. No gross focal neurologic deficits are appreciated. No gait instability. Skin:  Skin is warm, dry and intact. No rash noted. Psychiatric: Mood and affect are normal. Speech and behavior are normal.  ____________________________________________   LABS (all labs ordered are listed, but only abnormal results are displayed)  Results for orders placed or performed during the hospital encounter of 05/19/16 (from the past 24 hour(s))  CBC with Differential/Platelet     Status: Abnormal   Collection Time: 05/19/16 10:09 PM  Result Value Ref Range   WBC 13.2 (H) 3.6 - 11.0 K/uL   RBC 3.90 3.80 - 5.20 MIL/uL   Hemoglobin 11.3 (L) 12.0 - 16.0 g/dL   HCT 34.0 (L) 35.0 - 47.0 %   MCV 87.2 80.0 - 100.0 fL   MCH 29.1 26.0 - 34.0 pg   MCHC 33.4 32.0 - 36.0 g/dL   RDW 15.5 (H) 11.5 - 14.5 %   Platelets 224 150 - 440 K/uL   Neutrophils Relative % 79 %   Neutro Abs 10.2 (H) 1.4 - 6.5 K/uL   Lymphocytes Relative 11 %   Lymphs Abs 1.5 1.0 - 3.6 K/uL   Monocytes Relative 6 %   Monocytes Absolute 0.8 0.2 - 0.9 K/uL   Eosinophils Relative 4 %   Eosinophils Absolute 0.5 0 - 0.7 K/uL   Basophils Relative 0 %   Basophils Absolute 0.0 0 - 0.1 K/uL  Basic metabolic panel     Status: Abnormal   Collection Time: 05/19/16 10:09 PM  Result Value Ref Range   Sodium 132 (L) 135 - 145 mmol/L   Potassium 4.5 3.5 - 5.1 mmol/L   Chloride 102 101 - 111 mmol/L   CO2 22 22 - 32 mmol/L   Glucose, Bld 122 (H) 65 - 99 mg/dL   BUN 26 (H) 6 - 20 mg/dL   Creatinine, Ser 1.00 0.44 - 1.00 mg/dL   Calcium 9.9 8.9 - 10.3 mg/dL   GFR calc non Af Amer 48 (L) >60 mL/min   GFR calc Af Amer 55 (L) >60 mL/min   Anion gap 8 5 - 15  Brain natriuretic peptide     Status: Abnormal   Collection Time: 05/19/16 10:09 PM  Result Value Ref Range   B Natriuretic Peptide 1,575.0 (H) 0.0 - 100.0 pg/mL  Troponin I     Status: Abnormal   Collection Time: 05/19/16 10:09 PM  Result Value Ref Range   Troponin I 0.05 (HH)  <0.03 ng/mL   ____________________________________________  EKG My review and personal interpretation at Time: 21:30   Indication: sob  Rate: 70  Rhythm: first degree AV block Axis: left Other: IVCD, no significant changes from previous tracing 5/15 ____________________________________________  RADIOLOGY   I personally reviewed all radiographic images ordered to evaluate for the above acute complaints and reviewed radiology reports and findings.  These findings were personally discussed with the patient.  Please see medical record for radiology report.  ____________________________________________   PROCEDURES  Procedure(s) performed:  Procedures    Critical Care performed: no ____________________________________________   INITIAL IMPRESSION / ASSESSMENT AND PLAN / ED COURSE  Pertinent labs & imaging results that were available during my care of the patient were reviewed by me and considered in my medical decision making (see chart for details).  XUX:YBFXOV, copd, CHF, pna, ptx, malignancy, Pe, anemia   LARINE FIELDING is a 83 y.o. who presents to the ED with shortness of breath as described above. Patient with recent diagnosis of PE and has been compliant with her anticoagulation. Based on the expiratory crackles on exam and recent cessation of her Lasix , do suspect that she has primarily worsening heart failure.  Chest x-ray shows interstitial edema. Blood work shows mild elevation in her troponin and BNP supporting component of congestive heart failure. Patient given IV Lasix. As the patient is hypoxic and does not wear oxygen at home, do feel patient will require admission for further evaluation and management.  Have discussed with the patient and available family all diagnostics and treatments performed thus far and all questions were answered to the best of my ability. The patient demonstrates understanding and agreement with plan.        ____________________________________________   FINAL CLINICAL IMPRESSION(S) / ED DIAGNOSES  Final diagnoses:  Acute respiratory failure with hypoxia (HCC)  Acute on chronic congestive heart failure, unspecified heart failure type ([place])      NEW MEDICATIONS STARTED DURING THIS VISIT:  New Prescriptions   No medications on file     Note:  This document was prepared using Dragon voice recognition software and may include unintentional dictation errors.    Merlyn Lot, MD 05/19/16 [phone]

## 2016-05-19 NOTE — ED Triage Notes (Signed)
Pt. States shortness of breath that has been going on all day.  Pt. States she was here in the past week for PE and DVT in rt. Leg.  Pt. States she was started on eliquis this week.  Pt. Also states non-productive cough.  Pt. States she was taken off Lasix and was told to increase fluid intake.

## 2016-05-20 LAB — BASIC METABOLIC PANEL
Anion gap: 6 (ref 5–15)
BUN: 26 mg/dL — AB (ref 6–20)
CHLORIDE: 105 mmol/L (ref 101–111)
CO2: 24 mmol/L (ref 22–32)
CREATININE: 1.07 mg/dL — AB (ref 0.44–1.00)
Calcium: 10 mg/dL (ref 8.9–10.3)
GFR calc Af Amer: 51 mL/min — ABNORMAL LOW (ref >60)
GFR calc non Af Amer: 44 mL/min — ABNORMAL LOW (ref >60)
GLUCOSE: 98 mg/dL (ref 65–99)
Potassium: 4.3 mmol/L (ref 3.5–5.1)
SODIUM: 135 mmol/L (ref 135–145)

## 2016-05-20 LAB — MRSA PCR SCREENING: MRSA by PCR: NEGATIVE

## 2016-05-20 LAB — CBC
HEMATOCRIT: 32.8 % — AB (ref 35.0–47.0)
Hemoglobin: 11.3 g/dL — ABNORMAL LOW (ref 12.0–16.0)
MCH: 30.1 pg (ref 26.0–34.0)
MCHC: 34.4 g/dL (ref 32.0–36.0)
MCV: 87.4 fL (ref 80.0–100.0)
Platelets: 197 10*3/uL (ref 150–440)
RBC: 3.75 MIL/uL — ABNORMAL LOW (ref 3.80–5.20)
RDW: 15.7 % — AB (ref 11.5–14.5)
WBC: 9.2 10*3/uL (ref 3.6–11.0)

## 2016-05-20 MED ORDER — FUROSEMIDE 10 MG/ML IJ SOLN
40.0000 mg | Freq: Once | INTRAMUSCULAR | Status: AC
  Administered 2016-05-20: 40 mg via INTRAVENOUS
  Filled 2016-05-20: qty 4

## 2016-05-20 MED ORDER — ONDANSETRON HCL 4 MG PO TABS: 4.0000 mg | ORAL_TABLET | Freq: Four times a day (QID) | ORAL | Status: DC | PRN

## 2016-05-20 MED ORDER — ESCITALOPRAM OXALATE 10 MG PO TABS
10.0000 mg | ORAL_TABLET | Freq: Every day | ORAL | Status: DC
  Administered 2016-05-20 – 2016-05-21 (×2): 10 mg via ORAL
  Filled 2016-05-20 (×2): qty 1

## 2016-05-20 MED ORDER — LISINOPRIL 20 MG PO TABS
20.0000 mg | ORAL_TABLET | Freq: Every day | ORAL | Status: DC
  Administered 2016-05-20: 20 mg via ORAL
  Filled 2016-05-20: qty 1

## 2016-05-20 MED ORDER — ALPRAZOLAM 0.25 MG PO TABS
0.2500 mg | ORAL_TABLET | Freq: Two times a day (BID) | ORAL | Status: DC | PRN
  Administered 2016-05-20: 0.25 mg via ORAL
  Filled 2016-05-20: qty 1

## 2016-05-20 MED ORDER — ACETAMINOPHEN 650 MG RE SUPP
650.0000 mg | Freq: Four times a day (QID) | RECTAL | Status: DC | PRN
Start: 2016-05-20 — End: 2016-05-21

## 2016-05-20 MED ORDER — ACETAMINOPHEN 325 MG PO TABS: 650.0000 mg | ORAL_TABLET | Freq: Four times a day (QID) | ORAL | Status: DC | PRN

## 2016-05-20 MED ORDER — BISOPROLOL FUMARATE 5 MG PO TABS
10.0000 mg | ORAL_TABLET | Freq: Every day | ORAL | Status: DC
  Administered 2016-05-20 – 2016-05-21 (×2): 10 mg via ORAL
  Filled 2016-05-20 (×2): qty 2

## 2016-05-20 MED ORDER — ONDANSETRON HCL 4 MG/2ML IJ SOLN: 4.0000 mg | Freq: Four times a day (QID) | INTRAMUSCULAR | Status: DC | PRN

## 2016-05-20 MED ORDER — ASPIRIN EC 325 MG PO TBEC
325.0000 mg | DELAYED_RELEASE_TABLET | Freq: Every day | ORAL | Status: DC
  Administered 2016-05-20 – 2016-05-21 (×2): 325 mg via ORAL
  Filled 2016-05-20 (×2): qty 1

## 2016-05-20 MED ORDER — FUROSEMIDE 20 MG PO TABS: 20.0000 mg | ORAL_TABLET | Freq: Every day | ORAL | Status: DC

## 2016-05-20 MED ORDER — FUROSEMIDE 10 MG/ML IJ SOLN
40.0000 mg | Freq: Two times a day (BID) | INTRAMUSCULAR | Status: DC
  Administered 2016-05-20 – 2016-05-21 (×3): 40 mg via INTRAVENOUS
  Filled 2016-05-20 (×3): qty 4

## 2016-05-20 MED ORDER — ALBUTEROL SULFATE (2.5 MG/3ML) 0.083% IN NEBU: 3.0000 mL | INHALATION_SOLUTION | Freq: Four times a day (QID) | RESPIRATORY_TRACT | Status: DC | PRN

## 2016-05-20 MED ORDER — APIXABAN 5 MG PO TABS
5.0000 mg | ORAL_TABLET | Freq: Two times a day (BID) | ORAL | Status: DC
  Administered 2016-05-20 – 2016-05-21 (×4): 5 mg via ORAL
  Filled 2016-05-20 (×4): qty 1

## 2016-05-20 MED ORDER — ISOSORBIDE MONONITRATE ER 30 MG PO TB24
30.0000 mg | ORAL_TABLET | Freq: Every day | ORAL | Status: DC
  Administered 2016-05-20 – 2016-05-21 (×2): 30 mg via ORAL
  Filled 2016-05-20 (×2): qty 1

## 2016-05-20 NOTE — ED Notes (Signed)
Pt. Assisted to bathroom with minimal assistance.  Pt. Had 350 ml output in urine.

## 2016-05-20 NOTE — Plan of Care (Signed)
Problem: Safety: Goal: Ability to remain free from injury will improve Outcome: Progressing Pt encouraged to call for assistance with activity. Exit alarm activated.

## 2016-05-20 NOTE — Progress Notes (Signed)
[facility] NAME: Wanda Davenport    MR#:  [id number]  DATE OF BIRTH:  1923/09/01  SUBJECTIVE: Admitted yesterday because OF congestive heart failure with elevated BNP. Patient has shortness of breath, activity edema. Discharged a few days ago after she got diagnosed with PE. Family told me that they did not give Lasix since last discharge and the patient continued to drink 2 L of fluid a day. \ Started  back on Lasix. And she says that she feels better today. Oxygen at 3 L of nasal cannula and saturation 93%. Has no oxygen at home.   CHIEF COMPLAINT:   Chief Complaint  Patient presents with  . Shortness of Breath    Pt. here from home via EMS.    REVIEW OF SYSTEMS:   ROS CONSTITUTIONAL: No fever, fatigue or weakness.  EYES: No blurred or double vision.  EARS, NOSE, AND THROAT: No tinnitus or ear pain.  RESPIRATORY: Cough, shortness of breath better CARDIOVASCULAR: No chest pain, orthopnea, edema.  GASTROINTESTINAL: No nausea, vomiting, diarrhea or abdominal pain.  GENITOURINARY: No dysuria, hematuria.  ENDOCRINE: No polyuria, nocturia,  HEMATOLOGY: No anemia, easy bruising or bleeding SKIN: No rash or lesion. MUSCULOSKELETAL: No joint pain or arthritis.   NEUROLOGIC: No tingling, numbness, weakness.  PSYCHIATRY: No anxiety or depression.   DRUG ALLERGIES:   Allergies  Allergen Reactions  . Fenofibrate Other (See Comments)    Myalgias/gi upset   . Statins Other (See Comments)    Myalgias   . Sulfonamide Derivatives Nausea And Vomiting    REACTION: nausea    VITALS:  Blood pressure (!) 109/48, pulse 63, temperature 97.7 F (36.5 C), temperature source Oral, resp. rate 18, height 4\' 5"  (1.346 m), weight 72.3 kg (159 lb 6.4 oz), SpO2 97 %.  PHYSICAL EXAMINATION:  GENERAL:  83 y.o.-year-old patient lying in the bed with no acute distress.  EYES: Pupils equal, round, reactive to light and accommodation. No scleral  icterus. Extraocular muscles intact.  HEENT: Head atraumatic, normocephalic. Oropharynx and nasopharynx clear.  NECK:  Supple, no jugular venous distention. No thyroid enlargement, no tenderness.  LUNGS: Normal breath sounds bilaterally, no wheezing, rales,rhonchi or crepitation. No use of accessory muscles of respiration.  CARDIOVASCULAR: S1, S2 normal. No murmurs, rubs, or gallops.  ABDOMEN: Soft, nontender, nondistended. Bowel sounds present. No organomegaly or mass.  EXTREMITIES: No pedal edema, cyanosis, or clubbing.  NEUROLOGIC: Cranial nerves II through XII are intact. Muscle strength 5/5 in all extremities. Sensation intact. Gait not checked.  PSYCHIATRIC: The patient is alert and oriented x 3.  SKIN: No obvious rash, lesion, or ulcer.    LABORATORY PANEL:   CBC  Recent Labs Lab 05/20/16 0431  WBC 9.2  HGB 11.3*  HCT 32.8*  PLT 197   ------------------------------------------------------------------------------------------------------------------  Chemistries   Recent Labs Lab 05/15/16 0700  05/20/16 0431  NA 137  < > 135  K 4.3  < > 4.3  CL 107  < > 105  CO2 21*  < > 24  GLUCOSE 106*  < > 98  BUN 36*  < > 26*  CREATININE 1.15*  < > 1.07*  CALCIUM 10.0  < > 10.0  AST 17  --   --   ALT 10*  --   --   ALKPHOS 76  --   --   BILITOT 0.7  --   --   < > = values in this interval not displayed. ------------------------------------------------------------------------------------------------------------------  Cardiac Enzymes  Recent Labs Lab 05/19/16 2209  TROPONINI 0.05*   ------------------------------------------------------------------------------------------------------------------  RADIOLOGY:  Dg Chest 2 View  Result Date: 05/19/2016 CLINICAL DATA:  Shortness of breath, PE and DVT, on Eliquis EXAM: CHEST  2 VIEW COMPARISON:  CTA chest dated 05/15/2016 FINDINGS: Small to moderate left pleural effusion. Associated left lower lobe opacity, likely  atelectasis. Small right pleural effusion, best visualized on the lateral view. Pulmonary vascular congestion. Very mild interstitial edema is suspected. Cardiomegaly. IMPRESSION: Suspected mild interstitial edema. Small to moderate left pleural effusion. Associated left lower lobe opacity, likely atelectasis. Small right pleural effusion. Electronically Signed   By: Julian Hy M.D.   On: 05/19/2016 21:58    EKG:   Orders placed or performed during the hospital encounter of 05/19/16  . EKG 12-Lead  . EKG 12-Lead    ASSESSMENT AND PLAN:  Acute on chronic systolic heart failure, elevated BNP, previous ejection fraction on April 26 showed  EF 35-40% ,. CHF precipitated by not using Lasix at home and also drinking almost 2 L of fluid a day.; Family is educated about continuing the Lasix, checking daily weights, we will give her CHF education about checking weights daily, low-salt diet, also follow up with CHF clinic this time. Family told me that during the last discharge that did not understand instructions   About lasix and they thought Lasix should be stopped ,even though it was not stopped on d/c Instructions, So patient did not get Lasix since May 17.  #2 acute respiratory failure with hypoxia secondary to CHF exacerbation: Continue antibiotics oxygen as tolerated.  #3 recently diagnosed primary embolus, DVT: Continue liquids. Amenable for essential hypertension #5 hyperlipidemia Discussed the plan with patient, and family   Rounded with  nurse Tammy in the room.  All the records are reviewed and case discussed with Care Management/Social Workerr. Management plans discussed with the patient, family and they are in agreement.  CODE STATUS:full  TOTAL TIME TAKING CARE OF THIS PATIENT: 89minutes.   POSSIBLE D/C IN 1-2DAYS, DEPENDING ON CLINICAL CONDITION.   Epifanio Lesches M.D on 05/20/2016 at 9:41 AM  Between 7am to 6pm - Pager - [phone]  After 6pm go to [email or web address]  - password [email or web address]  [facility]  Office  [phone]  CC: Primary care physician; Mellody Dance, DO   Note: This dictation was prepared with Dragon dictation along with smaller phrase technology. Any transcriptional errors that result from this process are unintentional.

## 2016-05-20 NOTE — Consult Note (Signed)
[facility] Consultation Note  Patient ID: Wanda Davenport, MRN: [id number], DOB/AGE: 02/19/1923 83 y.o. Admit date: 05/19/2016   Date of Consult: 05/20/2016 Primary Physician: Mellody Dance, DO Primary Cardiologist: Nehemiah Massed  Chief Complaint:  Chief Complaint  Patient presents with  . Shortness of Breath    Pt. here from home via EMS.   Reason for Consult: HPI    Shortness of Breath   Additional comments: Pt. here from home via EMS.     Last edited by Johnathan Hausen, RN on 05/19/2016  9:26 PM. (History)    Acute on chronic heart failure  HPI: 83 y.o. female with known coronary artery disease status post recent anterior myocardial infarction PCI and stent placement of left anterior descending artery for which she was placed on appropriate medication management including dual antiplatelet therapy. The patient was intermittently taking these medication management when she had a readmission for severe shortness of breath and hypoxia and found to have multiple pulmonary emboli. She was then placed on appropriate medication management including anticoagulation with dual antiplatelet therapy changed to single antiplatelet therapy and had significant improvements. She also was on an ACE inhibitor beta blocker for previous myocardial infarction and isosorbide to reduce further possibility of chest discomfort. She was ambulating well and discharged to home when she had an apparent low sodium diet and appropriate rehabilitation. She had significant increase in shortness of breath PND and orthopnea consistent with congestive heart failure and was seen back in the emergency room with a chest x-ray consistent with pulmonary edema and bilateral pleural effusions. EKG was unchanged with normal sinus rhythm with first-degree AV block current evidence of recurrent myocardial infarction. She was given intravenous Lasix which is significantly improved her symptoms and she is feeling much better at  this time with hemodynamic stability  Past Medical History:  Diagnosis Date  . Allergic rhinitis   . Anxiety   . Arthritis   . Atypical chest pain   . Bronchitis 01/14/2016  . Chronic diastolic CHF (congestive heart failure) (HCC)    Echo 06/2010 EF 60-65%, no RWMAs, grade 1 diastolic dysfunction, mild LAE, PASP 14mmHg.  Marland Kitchen DIVERTICULOSIS, COLON   . HEMORRHOIDS, INTERNAL   . Hepatic steatosis   . Hyperlipidemia   . HYPERTENSION   . LBBB (left bundle branch block)    a. present on ECG 06/2010  . Osteopenia   . Pre-diabetes    A1c 6.0      Surgical History:  Past Surgical History:  Procedure Laterality Date  . ABDOMINAL HYSTERECTOMY    . BREAST SURGERY    . CATARACT EXTRACTION W/PHACO Left 05/12/2014   Procedure: CATARACT EXTRACTION PHACO AND INTRAOCULAR LENS PLACEMENT (IOC);  Surgeon: Leandrew Koyanagi, MD;  Location: [facility];  Service: Ophthalmology;  Laterality: Left;  . COLONOSCOPY     a. 2010  . CORONARY STENT INTERVENTION N/A 04/26/2016   Procedure: Coronary Stent Intervention;  Surgeon: Isaias Cowman, MD;  Location: [facility];  Service: Cardiovascular;  Laterality: N/A;  . LEFT HEART CATH AND CORONARY ANGIOGRAPHY N/A 04/26/2016   Procedure: Left Heart Cath and Coronary Angiography;  Surgeon: Isaias Cowman, MD;  Location: [facility];  Service: Cardiovascular;  Laterality: N/A;  . LEFT HEART CATH AND CORONARY ANGIOGRAPHY N/A 05/05/2016   Procedure: Left Heart Cath and Coronary Angiography;  Surgeon: Wellington Hampshire, MD;  Location: [facility] [place] [facility];  Service: Cardiovascular;  Laterality: N/A;  . TONSILLECTOMY       Home  Meds: Prior to Admission medications   Medication Sig Start Date End Date Taking? Authorizing Provider  albuterol (PROVENTIL HFA;VENTOLIN HFA) 108 (90 BASE) MCG/ACT inhaler Inhale 2 puffs into the lungs every 6 (six) hours as needed. Wheezing or shortness of breath   Yes [provider]  ALPRAZolam  (XANAX) 0.25 MG tablet Take 1 tablet (0.25 mg total) by mouth 2 (two) times daily as needed for anxiety. 04/30/16  Yes Fritzi Mandes, MD  apixaban (ELIQUIS) 5 MG TABS tablet Take 10 mg (2 tabs) twice a day till 05/22/16 and then from 05/23/16 take 1 tab (5 mg ) two times a day 05/17/16  Yes Fritzi Mandes, MD  aspirin 325 MG EC tablet Take 325 mg by mouth daily.   Yes [provider]  bisoprolol (ZEBETA) 10 MG tablet Take 1 tablet (10 mg total) by mouth daily. 03/29/16  Yes Lillard Anes D, NP  Calcium Carbonate-Vitamin D 600-400 MG-UNIT tablet Take 2 tablets by mouth daily. Patient taking differently: Take 1 tablet by mouth 2 (two) times daily.  01/25/16  Yes Opalski, Deborah, DO  escitalopram (LEXAPRO) 10 MG tablet TAKE 1 TABLET (10 MG TOTAL) BY MOUTH DAILY. 04/27/16  Yes Opalski, Deborah, DO  fluticasone (FLONASE) 50 MCG/ACT nasal spray Place 2 sprays into the nose daily. Nasal congestion Rarely used    Yes [provider]  isosorbide mononitrate (IMDUR) 30 MG 24 hr tablet Take 1 tablet (30 mg total) by mouth daily. 04/30/16  Yes Fritzi Mandes, MD  lisinopril (PRINIVIL,ZESTRIL) 20 MG tablet Take 1 tablet by mouth daily. 02/27/16 02/26/17 Yes [provider]  naphazoline-pheniramine (NAPHCON-A) 0.025-0.3 % ophthalmic solution Place 1 drop into both eyes 4 (four) times daily as needed for irritation.   Yes [provider]  nitroGLYCERIN (NITROSTAT) 0.3 MG SL tablet Place 0.3 mg under the tongue every 5 (five) minutes as needed for chest pain.   Yes [provider]    Inpatient Medications:  . apixaban  5 mg Oral BID  . aspirin  325 mg Oral Daily  . bisoprolol  10 mg Oral Daily  . escitalopram  10 mg Oral Daily  . furosemide  20 mg Oral Daily  . isosorbide mononitrate  30 mg Oral Daily  . lisinopril  20 mg Oral Daily     Allergies:  Allergies  Allergen Reactions  . Fenofibrate Other (See Comments)    Myalgias/gi upset   . Statins Other (See Comments)     Myalgias   . Sulfonamide Derivatives Nausea And Vomiting    REACTION: nausea    Social History   Social History  . Marital status: Widowed    Spouse name: N/A  . Number of children: N/A  . Years of education: N/A   Occupational History  . Retired    Social History Main Topics  . Smoking status: Former Smoker    Packs/day: 0.25    Years: 20.00    Types: Cigarettes    Quit date: 01/02/1971  . Smokeless tobacco: Never Used     Comment: smoked a few cigarettes/day x 20 yrs - quit @ age 54.  Marland Kitchen Alcohol use No  . Drug use: No  . Sexual activity: Not Currently   Other Topics Concern  . Not on file   Social History Narrative   Lives in [place] by herself.  She has family near-by.  She tries to stay active around the house - does all of her own housework and still mows (rides) her yard.  Daily caffeine      Family History  Problem Relation Age of Onset  . Colon cancer Sister   . Melanoma Sister   . Hyperlipidemia Brother   . Hypertension Mother   . Lupus Mother        died 72 from surgical complications  . Alzheimer's disease Brother        unknown  . Kidney disease Father        died 92  . Hypertension Father   . Alcohol abuse Father      Review of Systems Positive for Shortness of breath PND orthopnea Negative for: General:  chills, fever, night sweats or weight changes.  Cardiovascular: Positive for PND orthopnea negative for syncope dizziness  Dermatological skin lesions rashes Respiratory: Cough congestion Urologic: Frequent urination urination at night and hematuria Abdominal: negative for nausea, vomiting, diarrhea, bright red blood per rectum, melena, or hematemesis Neurologic: negative for visual changes, and/or hearing changes  All other systems reviewed and are otherwise negative except as noted above.  Labs:  Recent Labs  05/19/16 2209  TROPONINI 0.05*   Lab Results  Component Value Date   WBC 9.2 05/20/2016   HGB 11.3 (L) 05/20/2016    HCT 32.8 (L) 05/20/2016   MCV 87.4 05/20/2016   PLT 197 05/20/2016    Recent Labs Lab 05/15/16 0700  05/20/16 0431  NA 137  < > 135  K 4.3  < > 4.3  CL 107  < > 105  CO2 21*  < > 24  BUN 36*  < > 26*  CREATININE 1.15*  < > 1.07*  CALCIUM 10.0  < > 10.0  PROT 6.7  --   --   BILITOT 0.7  --   --   ALKPHOS 76  --   --   ALT 10*  --   --   AST 17  --   --   GLUCOSE 106*  < > 98  < > = values in this interval not displayed. Lab Results  Component Value Date   CHOL 218 (H) 04/27/2016   HDL 44 04/27/2016   LDLCALC 146 (H) 04/27/2016   TRIG 141 04/27/2016   No results found for: DDIMER  Radiology/Studies:  Dg Chest 2 View  Result Date: 05/19/2016 CLINICAL DATA:  Shortness of breath, PE and DVT, on Eliquis EXAM: CHEST  2 VIEW COMPARISON:  CTA chest dated 05/15/2016 FINDINGS: Small to moderate left pleural effusion. Associated left lower lobe opacity, likely atelectasis. Small right pleural effusion, best visualized on the lateral view. Pulmonary vascular congestion. Very mild interstitial edema is suspected. Cardiomegaly. IMPRESSION: Suspected mild interstitial edema. Small to moderate left pleural effusion. Associated left lower lobe opacity, likely atelectasis. Small right pleural effusion. Electronically Signed   By: Julian Hy M.D.   On: 05/19/2016 21:58   Dg Chest 2 View  Result Date: 05/15/2016 CLINICAL DATA:  83 year old female with progressive shortness of breath since yesterday. EXAM: CHEST  2 VIEW COMPARISON:  05/06/2016 and earlier. FINDINGS: Seated AP and lateral views of the chest. Stable cardiac size and mediastinal contours. Visualized tracheal air column is within normal limits. New left lung base and medial costophrenic angle opacity resembles a subpulmonic left pleural effusion. Small volume of fluid tracking in the bilateral pleural fissures including the right minor fissure. No superimposed pneumothorax. Mild pulmonary vascular congestion without overt edema.  Calcified aortic atherosclerosis. Stable visualized osseous structures. Negative visible bowel gas pattern. IMPRESSION: 1. Left lung base opacity favored due to  small or moderate size left subpulmonic pleural effusion. Left lower lobe consolidation (such as due to aspiration or pneumonia) is felt less likely. 2. Small volume fluid in the bilateral pleural fissures. Mild pulmonary vascular congestion without overt edema. Electronically Signed   By: Genevie Ann M.D.   On: 05/15/2016 07:38   Ct Angio Chest Pe W Or Wo Contrast  Result Date: 05/15/2016 CLINICAL DATA:  Shortness of Breath EXAM: CT ANGIOGRAPHY CHEST WITH CONTRAST TECHNIQUE: Multidetector CT imaging of the chest was performed using the standard protocol during bolus administration of intravenous contrast. Multiplanar CT image reconstructions and MIPs were obtained to evaluate the vascular anatomy. CONTRAST:  60 mL Isovue 370 nonionic COMPARISON:  Chest radiograph May 15, 2016 FINDINGS: Cardiovascular: There are multiple right-sided pulmonary emboli rising from the distal right main pulmonary artery with extension into multiple upper and lower lobe pulmonary artery branches. No pulmonary embolus identified to the left of midline. The right ventricle to left ventricular diameter ratio is less than 0.9, not consistent with right heart strain. There is no thoracic aortic aneurysm or dissection. There is moderate atherosclerotic calcification in the proximal left subclavian and right common carotid arteries. There are multiple foci of atherosclerotic calcification in the aorta. There are foci of coronary artery calcification. Pericardium is not appreciably thickened. Mediastinum/Nodes: The thyroid has an inhomogeneous appearance. There is a mass arising from the left lobe of the thyroid measuring 2.2 x 1.6 cm. There are several mildly prominent mediastinal lymph nodes. To the right of the distal trachea, there is a lymph node measuring 1.5 x 1.0 cm. At the level  of the aortic arch to the left, there is a lymph node measuring 1.9 x 1.1 cm. There is a lymph node in the right hilar region measuring 1.6 x 1.0 cm. There is a small hiatal hernia. Lungs/Pleura: There are moderate free-flowing pleural effusions bilaterally with consolidation in both lung bases, more pronounced on the left than on the right. There is mild interstitial edema. There is patchy atelectasis in the left upper lobe as well as in both lung bases. Upper Abdomen: In the visualized upper abdomen, there is atherosclerotic calcification in the aorta and major mesenteric branches. Visualized upper abdominal structures otherwise appear unremarkable. Musculoskeletal: There is degenerative change in the thoracic and visualized upper lumbar regions. There are no blastic or lytic bone lesions. Review of the MIP images confirms the above findings. IMPRESSION: Multiple pulmonary emboli on the right without right heart strain. Evidence a degree of congestive heart failure. Mild pulmonary edema with cardiomegaly and moderate pleural effusions. Bibasilar consolidation, somewhat more on the left than the right. This consolidation is largely due to compressive atelectasis, although superimposed pneumonia must be of concern in these areas, particularly on the left. Several mildly enlarged lymph nodes of uncertain etiology. These lymph nodes may well have reactive etiology given the changes in the lungs. Multiple foci of atherosclerotic calcification as well as foci of coronary artery calcification. **An incidental finding of potential clinical significance has been found. Left lobe thyroid mass measuring 2.2 x 1.6 cm. Consider further evaluation with thyroid ultrasound. If patient is clinically hyperthyroid, consider nuclear medicine thyroid uptake and scan.** Critical Value/emergent results were called by telephone at the time of interpretation on 05/15/2016 at 8:36 am to Dr. Shirlyn Goltz , who verbally acknowledged these  results. Electronically Signed   By: Lowella Grip III M.D.   On: 05/15/2016 08:36   US Venous Img Lower Bilateral  Result Date: 05/15/2016 CLINICAL  DATA:  83 year old female with new diagnosis of PE. Evaluate for residual DVT. EXAM: BILATERAL LOWER EXTREMITY VENOUS DOPPLER ULTRASOUND TECHNIQUE: Gray-scale sonography with graded compression, as well as color Doppler and duplex ultrasound were performed to evaluate the lower extremity deep venous systems from the level of the common femoral vein and including the common femoral, femoral, profunda femoral, popliteal and calf veins including the posterior tibial, peroneal and gastrocnemius veins when visible. The superficial great saphenous vein was also interrogated. Spectral Doppler was utilized to evaluate flow at rest and with distal augmentation maneuvers in the common femoral, femoral and popliteal veins. COMPARISON:  None. FINDINGS: RIGHT LOWER EXTREMITY Common Femoral Vein: No evidence of thrombus. Normal compressibility, respiratory phasicity and response to augmentation. Saphenofemoral Junction: No evidence of thrombus. Normal compressibility and flow on color Doppler imaging. Profunda Femoral Vein: No evidence of thrombus. Normal compressibility and flow on color Doppler imaging. Femoral Vein: No evidence of thrombus. Normal compressibility, respiratory phasicity and response to augmentation. Popliteal Vein: No evidence of thrombus. Normal compressibility, respiratory phasicity and response to augmentation. Calf Veins: Noncompressible peroneal vein. No evidence of color flow within 1 of the paired peroneal veins. The other peroneal and posterior tibial veins appear patent. Superficial Great Saphenous Vein: No evidence of thrombus. Normal compressibility and flow on color Doppler imaging. Venous Reflux:  None. Other Findings: Complex fluid collection in the popliteal fossa measures 5.3 x 1.5 x 2.6 cm consistent with a Baker's cyst. LEFT LOWER  EXTREMITY Common Femoral Vein: No evidence of thrombus. Normal compressibility, respiratory phasicity and response to augmentation. Saphenofemoral Junction: No evidence of thrombus. Normal compressibility and flow on color Doppler imaging. Profunda Femoral Vein: No evidence of thrombus. Normal compressibility and flow on color Doppler imaging. Femoral Vein: No evidence of thrombus. Normal compressibility, respiratory phasicity and response to augmentation. Popliteal Vein: No evidence of thrombus. Normal compressibility, respiratory phasicity and response to augmentation. Calf Veins: No evidence of thrombus. Normal compressibility and flow on color Doppler imaging. Superficial Great Saphenous Vein: No evidence of thrombus. Normal compressibility and flow on color Doppler imaging. Venous Reflux:  None. Other Findings: Complex fluid collection in the popliteal fossa measures 1.7 x 0.4 x 2.0 cm consistent with a Baker's cyst. IMPRESSION: 1. Positive for isolated deep venous thrombosis in 1 of the paired peroneal veins in the right calf. 2. No evidence of deep venous thrombosis in the left lower extremity. 3. Bilateral, right larger than left, Baker's cysts. Electronically Signed   By: Jacqulynn Cadet M.D.   On: 05/15/2016 17:00   Dg Chest Port 1 View  Result Date: 05/06/2016 CLINICAL DATA:  83 year old female with un stable angina. Shortness of breath and chest pain. Respiratory failure. EXAM: PORTABLE CHEST 1 VIEW COMPARISON:  05/05/2016 and earlier. FINDINGS: Portable AP upright view at 0519 hours. Stable lung volumes. Stable mediastinal contours with borderline to mild cardiomegaly. Visualized tracheal air column is within normal limits. No pneumothorax. Mildly decreased pulmonary vascularity since yesterday with no overt edema. Continued dense retrocardiac opacity. No large pleural effusion. IMPRESSION: 1. Decreased pulmonary vascularity since yesterday.  No acute edema. 2. Bilateral lower lobe collapse or  consolidation. Electronically Signed   By: Genevie Ann M.D.   On: 05/06/2016 07:13   Dg Chest Port 1 View  Result Date: 05/05/2016 CLINICAL DATA:  Acute onset shortness of breath. History of hypertension, CHF. EXAM: PORTABLE CHEST 1 VIEW COMPARISON:  Chest radiograph March 29, 2016 FINDINGS: The cardiac silhouette is mildly enlarged and unchanged. Mild chronic interstitial changes  without focal consolidation. Trace LEFT pleural effusion. No pneumothorax. Osteopenia. Soft tissue planes are nonsuspicious. Mildly calcified aortic knob. IMPRESSION: Stable examination: Mild cardiomegaly, chronic interstitial changes with trace LEFT pleural effusion. Electronically Signed   By: Elon Alas M.D.   On: 05/05/2016 02:07   Dg Chest Port 1 View  Result Date: 04/29/2016 CLINICAL DATA:  Shortness of breath and chest pain EXAM: PORTABLE CHEST 1 VIEW COMPARISON:  April 26, 2016 FINDINGS: There are rather minimal pleural effusions bilaterally. There is no edema or consolidation. Heart is mildly enlarged. There is no evident adenopathy. There is aortic atherosclerosis. No bone lesions. IMPRESSION: Mild cardiomegaly with rather minimal pleural effusions bilaterally. No edema or consolidation. There is aortic atherosclerosis. Electronically Signed   By: Lowella Grip III M.D.   On: 04/29/2016 11:35   Dg Chest Port 1 View  Result Date: 04/26/2016 CLINICAL DATA:  Chest pain EXAM: PORTABLE CHEST 1 VIEW COMPARISON:  09/23/2011 FINDINGS: The heart size and mediastinal contours are within normal limits. Both lungs are clear. The visualized skeletal structures are unremarkable. IMPRESSION: No active disease. Electronically Signed   By: Inez Catalina M.D.   On: 04/26/2016 10:32   US Thyroid  Result Date: 05/15/2016 CLINICAL DATA:  Incidental on CT. 83 year old female with a thyroid mass seen incidentally on recent CT scan of the chest EXAM: THYROID ULTRASOUND TECHNIQUE: Ultrasound examination of the thyroid gland and  adjacent soft tissues was performed. COMPARISON:  CT scan of the chest performed earlier today FINDINGS: Parenchymal Echotexture: Mildly heterogenous Isthmus: 0.4 cm Right lobe: 4.2 x 1.8 x 1.7 cm Left lobe: 4.1 x 1.8 x 1.5 cm _________________________________________________________ Estimated total number of nodules >/= 1 cm: 3 Number of spongiform nodules >/=  2 cm not described below (TR1): 0 Number of mixed cystic and solid nodules >/= 1.5 cm not described below (TR2): 0 _________________________________________________________ Nodule # 1: Location: Right; Mid Maximum size: 1.2 cm; Other 2 dimensions: 1.1 x 1.1 cm Composition: solid/almost completely solid (2) Echogenicity: isoechoic (1) Shape: not taller-than-wide (0) Margins: ill-defined (0) Echogenic foci: punctate echogenic foci (3) ACR TI-RADS total points: 6. ACR TI-RADS risk category: TR4 (4-6 points). ACR TI-RADS recommendations: *Given size (>/= 1 - 1.4 cm) and appearance, a follow-up ultrasound in 1 year should be considered based on TI-RADS criteria. _________________________________________________________ New the 2 cm mass identified on the recent prior CT scan corresponds with a 2.0 x 1.7 x 1.6 cm sonographically simple cyst. No further follow-up is required for this lesion. Similarly, there is a benign spongiform hypoechoic nodule in the left mid gland which measures 1.0 cm. IMPRESSION: 1. The 2 cm mass identified on the recent CT scan corresponds with a sonographically simple and therefore benign 2 cm cyst in the left upper gland. 2. A TI-RADS category 4 nodule in the right mid gland meets criteria for follow-up ultrasound in 1 year (provided the patient is a surgical candidate for thyroidectomy. If the patient is not a surgical candidate then no further follow-up is required). The above is in keeping with the ACR TI-RADS recommendations - J Am Coll Radiol 2017;14:587-595. Electronically Signed   By: Jacqulynn Cadet M.D.   On: 05/15/2016 17:06     EKG: Normal sinus rhythm with first-degree AV block and left bundle branch block  Weights: Filed Weights   05/19/16 2133 05/20/16 0050 05/20/16 0500  Weight: 73 kg (161 lb) 74.1 kg (163 lb 4.8 oz) 72.3 kg (159 lb 6.4 oz)     Physical Exam: Blood pressure Marland Kitchen)  109/48, pulse 63, temperature 97.7 F (36.5 C), temperature source Oral, resp. rate 18, height 4\' 5"  (1.346 m), weight 72.3 kg (159 lb 6.4 oz), SpO2 97 %. Body mass index is 39.9 kg/m. General: Well developed, well nourished, in no acute distress. Head eyes ears nose throat: Normocephalic, atraumatic, sclera non-icteric, no xanthomas, nares are without discharge. No apparent thyromegaly and/or mass  Lungs: Normal respiratory effort.  no wheezes, basilar rales, no rhonchi. Decreased breath sounds in the bases  Heart: Irregular with normal S1 S2. no murmur gallop, no rub, PMI is normal size and placement, carotid upstroke normal without bruit, jugular venous pressure is normal Abdomen: Soft, non-tender, non-distended with normoactive bowel sounds. No hepatomegaly. No rebound/guarding. No obvious abdominal masses. Abdominal aorta is normal size without bruit Extremities: Trace to 1+ edema. no cyanosis, no clubbing, no ulcers  Peripheral : 2+ bilateral upper extremity pulses, 2+ bilateral femoral pulses, 2+ bilateral dorsal pedal pulse Neuro: Alert and oriented. No facial asymmetry. No focal deficit. Moves all extremities spontaneously. Musculoskeletal: Normal muscle tone without kyphosis Psych:  Responds to questions appropriately with a normal affect.    Assessment: 83 year old female with the recent anterior myocardial infarction pulmonary embolism chronic kidney disease stage III with acute on chronic systolic and diastolic dysfunction heart failure with pleural effusions and no current evidence of recurrent myocardial infarction  Plan: 1. Continue intravenous Lasix and changed to oral Lasix today for pleural effusion and  recent pulmonary edema 2. Continue beta blocker and ACE inhibitor for recent myocardial infarction and LV systolic dysfunction 3. Isosorbide for pulmonary capacitance and any concerns of anginal symptoms 4. Anticoagulation with elequis for continued treatment of pulmonary embolism 5. Single antiplatelet medication management with aspirin and abstain from Plavix due to concerns of bleeding complications 6. No further cardiac diagnostics necessary at this time 7. Begin ambulation and follow for improvements of symptoms and possible discharge to home with follow-up next week  Signed, Corey Skains M.D. [facility] 05/20/2016, 7:07 AM

## 2016-05-21 LAB — BASIC METABOLIC PANEL
Anion gap: 6 (ref 5–15)
BUN: 30 mg/dL — AB (ref 6–20)
CHLORIDE: 101 mmol/L (ref 101–111)
CO2: 27 mmol/L (ref 22–32)
CREATININE: 1.08 mg/dL — AB (ref 0.44–1.00)
Calcium: 9.8 mg/dL (ref 8.9–10.3)
GFR calc Af Amer: 50 mL/min — ABNORMAL LOW (ref >60)
GFR calc non Af Amer: 44 mL/min — ABNORMAL LOW (ref >60)
GLUCOSE: 94 mg/dL (ref 65–99)
Potassium: 4.1 mmol/L (ref 3.5–5.1)
Sodium: 134 mmol/L — ABNORMAL LOW (ref 135–145)

## 2016-05-21 MED ORDER — FUROSEMIDE 40 MG PO TABS: 40.0000 mg | ORAL_TABLET | Freq: Every day | ORAL | 0 refills | Status: DC

## 2016-05-21 NOTE — Progress Notes (Addendum)
SATURATION QUALIFICATIONS: (This note is used to comply with regulatory documentation for home oxygen)  Patient Saturations on Room Air at Rest = 94%  Patient Saturations on Room Air while Ambulating = 77%  2 LPM applied O2 saturations at 95% at rest.  Please briefly explain why patient needs home oxygen: Patient with dyspnea on minimal exertion. Desaturation with ambulation of 100 ft.

## 2016-05-21 NOTE — Care Management (Signed)
Patient has qualified for home 02.  Use of diuretics have failed to resolve hypoxia due to CHF.  No agency preference .  Referral called to [facility].  Notified [facility] of discharge.

## 2016-05-21 NOTE — Progress Notes (Signed)
Discharge instructions reviewed with patient and daughter at bedside. Medication regimen including starting Lasix PO and discontinuing Lisinopril reviewed. Patient instructed to record daily weights and educated on additional lasix dose as needed. Daughter and patient denied questions regarding medication regimen upon discharge. Appointments discussed and daughter aware of dates and times. Telemetry removed and returned. IV discontinued with no complications or discomfort. Home oxygen delivered and daughter verbalized understanding of use. Patient escorted via volunteer in wheelchair to car.

## 2016-05-21 NOTE — Care Management Note (Signed)
Case Management Note  Patient Details  Name: Wanda Davenport MRN: [id number] Date of Birth: [birth year]  Subjective/Objective:                 discharge 5/17 after admission for pulmonary embolus and DVT.  During that stay patient was assessed for home oxygen and did not qualify.  She has had a room air exertional sat on 5/19 in the ED of 87%. A referral was made to [facility] admit to resume SN and PT. Agency was to have seen within 24 hours of discharge   Action/Plan:  Requested home oxygen assessment, and orders from attending to resume home health services.  Reached out to [facility] to determine if home visit was made within 24 hours of discharge  Expected Discharge Date:  05/21/16               Expected Discharge Plan:     In-House Referral:     Discharge planning Services     Post Acute Care Choice:    Choice offered to:     DME Arranged:    DME Agency:     HH Arranged:    [place] Agency:     Status of Service:     If discussed at [employer], dates discussed:    Additional Comments:  Katrina Stack, RN 05/21/2016, 9:23 AM

## 2016-05-21 NOTE — Discharge Instructions (Signed)
[facility] appointment on May 30, 2016 at 12:00pm with [name], [place]. Please call [phone] to reschedule.    - Daily fluids < 2 liters. - Low salt diet - Check weight everyday and keep log. Take to your doctors appt. - Take extra dose of lasix if you gain more than 3 pounds weight.   Resume home health RN and PT

## 2016-05-21 NOTE — Progress Notes (Signed)
[facility] Cardiology [facility] Encounter Note  Patient: Wanda Davenport / Admit Date: 05/19/2016 / Date of Encounter: 05/21/2016, 8:47 AM   Subjective: Patient feeling much better today. No evidence of congestive heart failure or myocardial infarction type symptoms today. Hypoxia less concerning. Heart rate controlled with current medical regimen. No evidence of bleeding complications with anticoagulation  Review of Systems: Positive for: Weakness Negative for: Vision change, hearing change, syncope, dizziness, nausea, vomiting,diarrhea, bloody stool, stomach pain, cough, congestion, diaphoresis, urinary frequency, urinary pain,skin lesions, skin rashes Others previously listed  Objective: Telemetry: Normal sinus rhythm Physical Exam: Blood pressure (!) 108/52, pulse 73, temperature 98.3 F (36.8 C), temperature source Oral, resp. rate 18, height 4\' 5"  (1.346 m), weight 70.7 kg (155 lb 12.8 oz), SpO2 95 %. Body mass index is 39 kg/m. General: Well developed, well nourished, in no acute distress. Head: Normocephalic, atraumatic, sclera non-icteric, no xanthomas, nares are without discharge. Neck: No apparent masses Lungs: Normal respirations with no wheezes, no rhonchi, no rales , no crackles   Heart: Regular rate and rhythm, normal S1 S2, no murmur, no rub, no gallop, PMI is normal size and placement, carotid upstroke normal without bruit, jugular venous pressure normal Abdomen: Soft, non-tender, non-distended with normoactive bowel sounds. No hepatosplenomegaly. Abdominal aorta is normal size without bruit Extremities: No edema, no clubbing, no cyanosis, no ulcers,  Peripheral: 2+ radial, 2+ femoral, 2+ dorsal pedal pulses Neuro: Alert and oriented. Moves all extremities spontaneously. Psych:  Responds to questions appropriately with a normal affect.   Intake/Output Summary (Last 24 hours) at 05/21/16 0847 Last data filed at 05/21/16 0654  Gross per 24 hour  Intake               480 ml  Output             2850 ml  Net            -2370 ml    Inpatient Medications:  . apixaban  5 mg Oral BID  . aspirin  325 mg Oral Daily  . bisoprolol  10 mg Oral Daily  . escitalopram  10 mg Oral Daily  . furosemide  40 mg Intravenous Q12H  . isosorbide mononitrate  30 mg Oral Daily  . lisinopril  20 mg Oral Daily   Infusions:   Labs:  Recent Labs  05/20/16 0431 05/21/16 0433  NA 135 134*  K 4.3 4.1  CL 105 101  CO2 24 27  GLUCOSE 98 94  BUN 26* 30*  CREATININE 1.07* 1.08*  CALCIUM 10.0 9.8   No results for input(s): AST, ALT, ALKPHOS, BILITOT, PROT, ALBUMIN in the last 72 hours.  Recent Labs  05/19/16 2209 05/20/16 0431  WBC 13.2* 9.2  NEUTROABS 10.2*  --   HGB 11.3* 11.3*  HCT 34.0* 32.8*  MCV 87.2 87.4  PLT 224 197    Recent Labs  05/19/16 2209  TROPONINI 0.05*   Invalid input(s): POCBNP No results for input(s): HGBA1C in the last 72 hours.   Weights: Filed Weights   05/20/16 0050 05/20/16 0500 05/21/16 0403  Weight: 74.1 kg (163 lb 4.8 oz) 72.3 kg (159 lb 6.4 oz) 70.7 kg (155 lb 12.8 oz)     Radiology/Studies:  Dg Chest 2 View  Result Date: 05/19/2016 CLINICAL DATA:  Shortness of breath, PE and DVT, on Eliquis EXAM: CHEST  2 VIEW COMPARISON:  CTA chest dated 05/15/2016 FINDINGS: Small to moderate left pleural effusion. Associated left lower lobe opacity, likely atelectasis. Small  right pleural effusion, best visualized on the lateral view. Pulmonary vascular congestion. Very mild interstitial edema is suspected. Cardiomegaly. IMPRESSION: Suspected mild interstitial edema. Small to moderate left pleural effusion. Associated left lower lobe opacity, likely atelectasis. Small right pleural effusion. Electronically Signed   By: Julian Hy M.D.   On: 05/19/2016 21:58   Dg Chest 2 View  Result Date: 05/15/2016 CLINICAL DATA:  83 year old female with progressive shortness of breath since yesterday. EXAM: CHEST  2 VIEW COMPARISON:   05/06/2016 and earlier. FINDINGS: Seated AP and lateral views of the chest. Stable cardiac size and mediastinal contours. Visualized tracheal air column is within normal limits. New left lung base and medial costophrenic angle opacity resembles a subpulmonic left pleural effusion. Small volume of fluid tracking in the bilateral pleural fissures including the right minor fissure. No superimposed pneumothorax. Mild pulmonary vascular congestion without overt edema. Calcified aortic atherosclerosis. Stable visualized osseous structures. Negative visible bowel gas pattern. IMPRESSION: 1. Left lung base opacity favored due to small or moderate size left subpulmonic pleural effusion. Left lower lobe consolidation (such as due to aspiration or pneumonia) is felt less likely. 2. Small volume fluid in the bilateral pleural fissures. Mild pulmonary vascular congestion without overt edema. Electronically Signed   By: Genevie Ann M.D.   On: 05/15/2016 07:38   Ct Angio Chest Pe W Or Wo Contrast  Result Date: 05/15/2016 CLINICAL DATA:  Shortness of Breath EXAM: CT ANGIOGRAPHY CHEST WITH CONTRAST TECHNIQUE: Multidetector CT imaging of the chest was performed using the standard protocol during bolus administration of intravenous contrast. Multiplanar CT image reconstructions and MIPs were obtained to evaluate the vascular anatomy. CONTRAST:  60 mL Isovue 370 nonionic COMPARISON:  Chest radiograph May 15, 2016 FINDINGS: Cardiovascular: There are multiple right-sided pulmonary emboli rising from the distal right main pulmonary artery with extension into multiple upper and lower lobe pulmonary artery branches. No pulmonary embolus identified to the left of midline. The right ventricle to left ventricular diameter ratio is less than 0.9, not consistent with right heart strain. There is no thoracic aortic aneurysm or dissection. There is moderate atherosclerotic calcification in the proximal left subclavian and right common carotid  arteries. There are multiple foci of atherosclerotic calcification in the aorta. There are foci of coronary artery calcification. Pericardium is not appreciably thickened. Mediastinum/Nodes: The thyroid has an inhomogeneous appearance. There is a mass arising from the left lobe of the thyroid measuring 2.2 x 1.6 cm. There are several mildly prominent mediastinal lymph nodes. To the right of the distal trachea, there is a lymph node measuring 1.5 x 1.0 cm. At the level of the aortic arch to the left, there is a lymph node measuring 1.9 x 1.1 cm. There is a lymph node in the right hilar region measuring 1.6 x 1.0 cm. There is a small hiatal hernia. Lungs/Pleura: There are moderate free-flowing pleural effusions bilaterally with consolidation in both lung bases, more pronounced on the left than on the right. There is mild interstitial edema. There is patchy atelectasis in the left upper lobe as well as in both lung bases. Upper Abdomen: In the visualized upper abdomen, there is atherosclerotic calcification in the aorta and major mesenteric branches. Visualized upper abdominal structures otherwise appear unremarkable. Musculoskeletal: There is degenerative change in the thoracic and visualized upper lumbar regions. There are no blastic or lytic bone lesions. Review of the MIP images confirms the above findings. IMPRESSION: Multiple pulmonary emboli on the right without right heart strain. Evidence a degree  of congestive heart failure. Mild pulmonary edema with cardiomegaly and moderate pleural effusions. Bibasilar consolidation, somewhat more on the left than the right. This consolidation is largely due to compressive atelectasis, although superimposed pneumonia must be of concern in these areas, particularly on the left. Several mildly enlarged lymph nodes of uncertain etiology. These lymph nodes may well have reactive etiology given the changes in the lungs. Multiple foci of atherosclerotic calcification as well as  foci of coronary artery calcification. **An incidental finding of potential clinical significance has been found. Left lobe thyroid mass measuring 2.2 x 1.6 cm. Consider further evaluation with thyroid ultrasound. If patient is clinically hyperthyroid, consider nuclear medicine thyroid uptake and scan.** Critical Value/emergent results were called by telephone at the time of interpretation on 05/15/2016 at 8:36 am to Dr. Shirlyn Goltz , who verbally acknowledged these results. Electronically Signed   By: Lowella Grip III M.D.   On: 05/15/2016 08:36   US Venous Img Lower Bilateral  Result Date: 05/15/2016 CLINICAL DATA:  83 year old female with new diagnosis of PE. Evaluate for residual DVT. EXAM: BILATERAL LOWER EXTREMITY VENOUS DOPPLER ULTRASOUND TECHNIQUE: Gray-scale sonography with graded compression, as well as color Doppler and duplex ultrasound were performed to evaluate the lower extremity deep venous systems from the level of the common femoral vein and including the common femoral, femoral, profunda femoral, popliteal and calf veins including the posterior tibial, peroneal and gastrocnemius veins when visible. The superficial great saphenous vein was also interrogated. Spectral Doppler was utilized to evaluate flow at rest and with distal augmentation maneuvers in the common femoral, femoral and popliteal veins. COMPARISON:  None. FINDINGS: RIGHT LOWER EXTREMITY Common Femoral Vein: No evidence of thrombus. Normal compressibility, respiratory phasicity and response to augmentation. Saphenofemoral Junction: No evidence of thrombus. Normal compressibility and flow on color Doppler imaging. Profunda Femoral Vein: No evidence of thrombus. Normal compressibility and flow on color Doppler imaging. Femoral Vein: No evidence of thrombus. Normal compressibility, respiratory phasicity and response to augmentation. Popliteal Vein: No evidence of thrombus. Normal compressibility, respiratory phasicity and response  to augmentation. Calf Veins: Noncompressible peroneal vein. No evidence of color flow within 1 of the paired peroneal veins. The other peroneal and posterior tibial veins appear patent. Superficial Great Saphenous Vein: No evidence of thrombus. Normal compressibility and flow on color Doppler imaging. Venous Reflux:  None. Other Findings: Complex fluid collection in the popliteal fossa measures 5.3 x 1.5 x 2.6 cm consistent with a Baker's cyst. LEFT LOWER EXTREMITY Common Femoral Vein: No evidence of thrombus. Normal compressibility, respiratory phasicity and response to augmentation. Saphenofemoral Junction: No evidence of thrombus. Normal compressibility and flow on color Doppler imaging. Profunda Femoral Vein: No evidence of thrombus. Normal compressibility and flow on color Doppler imaging. Femoral Vein: No evidence of thrombus. Normal compressibility, respiratory phasicity and response to augmentation. Popliteal Vein: No evidence of thrombus. Normal compressibility, respiratory phasicity and response to augmentation. Calf Veins: No evidence of thrombus. Normal compressibility and flow on color Doppler imaging. Superficial Great Saphenous Vein: No evidence of thrombus. Normal compressibility and flow on color Doppler imaging. Venous Reflux:  None. Other Findings: Complex fluid collection in the popliteal fossa measures 1.7 x 0.4 x 2.0 cm consistent with a Baker's cyst. IMPRESSION: 1. Positive for isolated deep venous thrombosis in 1 of the paired peroneal veins in the right calf. 2. No evidence of deep venous thrombosis in the left lower extremity. 3. Bilateral, right larger than left, Baker's cysts. Electronically Signed   By: Jacqulynn Cadet  M.D.   On: 05/15/2016 17:00   Dg Chest Port 1 View  Result Date: 05/06/2016 CLINICAL DATA:  83 year old female with un stable angina. Shortness of breath and chest pain. Respiratory failure. EXAM: PORTABLE CHEST 1 VIEW COMPARISON:  05/05/2016 and earlier. FINDINGS:  Portable AP upright view at 0519 hours. Stable lung volumes. Stable mediastinal contours with borderline to mild cardiomegaly. Visualized tracheal air column is within normal limits. No pneumothorax. Mildly decreased pulmonary vascularity since yesterday with no overt edema. Continued dense retrocardiac opacity. No large pleural effusion. IMPRESSION: 1. Decreased pulmonary vascularity since yesterday.  No acute edema. 2. Bilateral lower lobe collapse or consolidation. Electronically Signed   By: Genevie Ann M.D.   On: 05/06/2016 07:13   Dg Chest Port 1 View  Result Date: 05/05/2016 CLINICAL DATA:  Acute onset shortness of breath. History of hypertension, CHF. EXAM: PORTABLE CHEST 1 VIEW COMPARISON:  Chest radiograph March 29, 2016 FINDINGS: The cardiac silhouette is mildly enlarged and unchanged. Mild chronic interstitial changes without focal consolidation. Trace LEFT pleural effusion. No pneumothorax. Osteopenia. Soft tissue planes are nonsuspicious. Mildly calcified aortic knob. IMPRESSION: Stable examination: Mild cardiomegaly, chronic interstitial changes with trace LEFT pleural effusion. Electronically Signed   By: Elon Alas M.D.   On: 05/05/2016 02:07   Dg Chest Port 1 View  Result Date: 04/29/2016 CLINICAL DATA:  Shortness of breath and chest pain EXAM: PORTABLE CHEST 1 VIEW COMPARISON:  April 26, 2016 FINDINGS: There are rather minimal pleural effusions bilaterally. There is no edema or consolidation. Heart is mildly enlarged. There is no evident adenopathy. There is aortic atherosclerosis. No bone lesions. IMPRESSION: Mild cardiomegaly with rather minimal pleural effusions bilaterally. No edema or consolidation. There is aortic atherosclerosis. Electronically Signed   By: Lowella Grip III M.D.   On: 04/29/2016 11:35   Dg Chest Port 1 View  Result Date: 04/26/2016 CLINICAL DATA:  Chest pain EXAM: PORTABLE CHEST 1 VIEW COMPARISON:  09/23/2011 FINDINGS: The heart size and mediastinal  contours are within normal limits. Both lungs are clear. The visualized skeletal structures are unremarkable. IMPRESSION: No active disease. Electronically Signed   By: Inez Catalina M.D.   On: 04/26/2016 10:32   US Thyroid  Result Date: 05/15/2016 CLINICAL DATA:  Incidental on CT. 83 year old female with a thyroid mass seen incidentally on recent CT scan of the chest EXAM: THYROID ULTRASOUND TECHNIQUE: Ultrasound examination of the thyroid gland and adjacent soft tissues was performed. COMPARISON:  CT scan of the chest performed earlier today FINDINGS: Parenchymal Echotexture: Mildly heterogenous Isthmus: 0.4 cm Right lobe: 4.2 x 1.8 x 1.7 cm Left lobe: 4.1 x 1.8 x 1.5 cm _________________________________________________________ Estimated total number of nodules >/= 1 cm: 3 Number of spongiform nodules >/=  2 cm not described below (TR1): 0 Number of mixed cystic and solid nodules >/= 1.5 cm not described below (TR2): 0 _________________________________________________________ Nodule # 1: Location: Right; Mid Maximum size: 1.2 cm; Other 2 dimensions: 1.1 x 1.1 cm Composition: solid/almost completely solid (2) Echogenicity: isoechoic (1) Shape: not taller-than-wide (0) Margins: ill-defined (0) Echogenic foci: punctate echogenic foci (3) ACR TI-RADS total points: 6. ACR TI-RADS risk category: TR4 (4-6 points). ACR TI-RADS recommendations: *Given size (>/= 1 - 1.4 cm) and appearance, a follow-up ultrasound in 1 year should be considered based on TI-RADS criteria. _________________________________________________________ New the 2 cm mass identified on the recent prior CT scan corresponds with a 2.0 x 1.7 x 1.6 cm sonographically simple cyst. No further follow-up is required for this lesion.  Similarly, there is a benign spongiform hypoechoic nodule in the left mid gland which measures 1.0 cm. IMPRESSION: 1. The 2 cm mass identified on the recent CT scan corresponds with a sonographically simple and therefore benign  2 cm cyst in the left upper gland. 2. A TI-RADS category 4 nodule in the right mid gland meets criteria for follow-up ultrasound in 1 year (provided the patient is a surgical candidate for thyroidectomy. If the patient is not a surgical candidate then no further follow-up is required). The above is in keeping with the ACR TI-RADS recommendations - J Am Coll Radiol 2017;14:587-595. Electronically Signed   By: Jacqulynn Cadet M.D.   On: 05/15/2016 17:06     Assessment and Recommendation  83 y.o. female with the known previous myocardial infarction and month and half ago with PCI and stent patient of left anterior descending artery and subsequent pulmonary embolism now on the single antiplatelet therapy with anticoagulation sent home with appropriate medication but appeared not to a been receiving her Lasix and caused her to have systolic dysfunction congestive heart failure now improved without evidence of myocardial infarction. 1. Continue elequis for further risk reduction in pulmonary embolism 2. Continue aspirin as single antiplatelet therapy for previous myocardial infarction PCI and stent placement 3. Beta blocker for further treatment of myocardial infarction in addition to of ACE inhibitor without change today 4. Continue furosemide at 40 mg orally for further risk reduction in congestive heart failure exacerbation 5. Begin ambulation and follow for improvements of symptoms and possible discharge to home with follow-up next week  Signed, Serafina Royals M.D. FACC

## 2016-05-22 ENCOUNTER — Ambulatory Visit (INDEPENDENT_AMBULATORY_CARE_PROVIDER_SITE_OTHER): Payer: Medicare HMO | Admitting: Family Medicine

## 2016-05-22 ENCOUNTER — Encounter: Payer: Self-pay | Admitting: Family Medicine

## 2016-05-22 VITALS — BP 103/62 | HR 55 | Temp 97.4°F | Wt 158.0 lb

## 2016-05-22 DIAGNOSIS — Z7189 Other specified counseling: Secondary | ICD-10-CM | POA: Diagnosis not present

## 2016-05-22 DIAGNOSIS — M8580 Other specified disorders of bone density and structure, unspecified site: Secondary | ICD-10-CM | POA: Diagnosis not present

## 2016-05-22 DIAGNOSIS — I5032 Chronic diastolic (congestive) heart failure: Secondary | ICD-10-CM | POA: Diagnosis not present

## 2016-05-22 DIAGNOSIS — I447 Left bundle-branch block, unspecified: Secondary | ICD-10-CM | POA: Diagnosis not present

## 2016-05-22 DIAGNOSIS — I350 Nonrheumatic aortic (valve) stenosis: Secondary | ICD-10-CM | POA: Diagnosis not present

## 2016-05-22 DIAGNOSIS — I5043 Acute on chronic combined systolic (congestive) and diastolic (congestive) heart failure: Secondary | ICD-10-CM

## 2016-05-22 DIAGNOSIS — I2102 ST elevation (STEMI) myocardial infarction involving left anterior descending coronary artery: Secondary | ICD-10-CM | POA: Diagnosis not present

## 2016-05-22 DIAGNOSIS — R7303 Prediabetes: Secondary | ICD-10-CM | POA: Diagnosis not present

## 2016-05-22 DIAGNOSIS — I429 Cardiomyopathy, unspecified: Secondary | ICD-10-CM | POA: Diagnosis not present

## 2016-05-22 DIAGNOSIS — K5730 Diverticulosis of large intestine without perforation or abscess without bleeding: Secondary | ICD-10-CM | POA: Diagnosis not present

## 2016-05-22 DIAGNOSIS — I110 Hypertensive heart disease with heart failure: Secondary | ICD-10-CM | POA: Diagnosis not present

## 2016-05-22 NOTE — Progress Notes (Addendum)
Impression and Recommendations:    1. Counseling regarding end of life decision making   2. Acute ST elevation myocardial infarction (STEMI) involving left anterior descending (LAD) coronary artery ([place])   3. Acute on chronic combined systolic and diastolic CHF (congestive heart failure) ([place])    - pt stable.  Feels well.   - We discussed end- of life issues and DNR status.   Daughter will discus with MOM further her wishes and told me she will discuss with home helath RN that come sto the house.  I rec filling out paperwork and getting notarized.  -  Pt is following with [facility] for her heart care/ BP/CHol/ anti-coag etc.  They understand now that many of these crutical issues have occurred with her heart- future BP meds, CV meds, Chol meds, duretics, KCL supp etc are best to come from those cards providers that are closely monitoing her CV condition.  I discussed this with pt's daughter and she prefers it that way  The patient was counseled, risk factors were discussed, anticipatory guidance given.  Pt was in the office today for 40 minutes.  Med records reviewed with pt from recent hospitalization and cardiology OV's etc.  50% time spent in face to face counseling of patients various medical conditions, treatment plans of those medical conditions including medicine management and lifestyle modification- safety issues at home, fall prevention, strategies to improve health and well being- keep her busy mentally and physically; and in coordination of care.  Please see AVS handed out to patient at the end of our visit for further patient instructions/ counseling done pertaining to today's office visit.   Return for pt's daughter declined AVS/ paperwork; f/up 2mo d/c pt and D.     Note: This document was prepared using Dragon voice recognition software and may include unintentional dictation errors.  Fedra Lanter 12:22  AM --------------------------------------------------------------------------------------------------------------------------------------------------------------------------------------------------------------------------------------------    Subjective:    CC:  Chief Complaint  Patient presents with  . Hospitalization Follow-up    HPI: TULSI CROSSETT is a 83 y.o. female who presents to [place] at [facility] today for issues as discussed below.  - here today with daughter- who is currently staying with her MOM/ pt   Pt sen in hosp 5/19- 5/21 for acute SOB for CHF exacerbation likely secondary to prior recent AMI.  I have reviewed the admission note, but there was no discharge summary for my review.  Per pt- doing well.   No wt inc and has been checking qd.  stable   Still 2 pillow orthopnea- not increased form prior.  She was d/c ed on O2- 24/7 and they like it this way.   Feels better with it.   breahting stbale- feels "good"; breathing "good".   She is monitored closely by [facility] for her CAD, CHF requiring freq diuresis etc.   They have home health nurse coming several times per wk and home OT/PT starting tomorrow.   Pt has walker- which she dsoesn't like to use but knows she should.  SAlo has cane to use   Wt Readings from Last 3 Encounters:  05/22/16 158 lb (71.7 kg)  05/21/16 155 lb 12.8 oz (70.7 kg)  05/15/16 161 lb (73 kg)   BP Readings from Last 3 Encounters:  05/22/16 103/62  05/21/16 (!) 74/45  05/17/16 (!) 98/46   Pulse Readings from Last 3 Encounters:  05/22/16 (!) 55  05/21/16 60  05/17/16 63  BMI Readings from Last 3 Encounters:  05/22/16 39.55 kg/m  05/21/16 39.00 kg/m  05/15/16 29.45 kg/m     Patient Care Team    Relationship Specialty Notifications Start End  Mellody Dance, DO PCP - General Family Medicine  10/24/15   Christene Slates, MD  Dermatology  10/24/15   Leandrew Koyanagi, MD Referring Physician Ophthalmology  11/06/15    Deboraha Sprang, MD Consulting Physician Cardiology  11/06/15   Gatha Mayer, MD Consulting Physician Gastroenterology  11/06/15    Comment: she used to see Delfin Edis, MD  Corey Skains, MD Consulting Physician Cardiology  05/06/16    Comment: CHF specialist  Alfonzo Feller, [facility]  Admissions 05/17/16   Corey Skains, MD Consulting Physician Cardiology  05/20/16      Patient Active Problem List   Diagnosis Date Noted  . Medication monitoring encounter 01/26/2016    Priority: High  . Intolerance of drug- statins 01/25/2016    Priority: High  . Hypertriglyceridemia 12/13/2015    Priority: High  . Low serum HDL 12/13/2015    Priority: High  . Adjustment disorder with mixed anxiety and depressed mood 11/06/2015    Priority: High  . Hyperlipidemia     Priority: High  . Pre-diabetes     Priority: High  . HTN (hypertension) 04/26/2007    Priority: High  . Claudication of left lower extremity ([place]) 12/13/2015    Priority: Medium  . Basal cell carcinoma 10/31/2015    Priority: Medium  . GAD (generalized anxiety disorder) 10/13/2015    Priority: Medium  . chronic LBBB (left bundle branch block)- since 2012     Priority: Medium  . B12 deficiency 12/13/2015    Priority: Low  . Vitamin D deficiency 11/06/2015    Priority: Low  . Environmental and seasonal allergies 10/31/2015    Priority: Low  . Reactive airway disease- only spring and fall due to allergies 10/31/2015    Priority: Low  . Generalized OA     Priority: Low  . Allergic rhinitis     Priority: Low  . PE (pulmonary thromboembolism) ([place]) 05/19/2016  . Acute on chronic combined systolic and diastolic CHF (congestive heart failure) ([place]) 05/05/2016  . Unstable angina ([place])   . Acute ST elevation myocardial infarction (STEMI) involving left anterior descending (LAD) coronary artery ([place]) 04/26/2016  . STEMI (ST elevation myocardial infarction) ([place]) 04/26/2016  . Knee  pain, bilateral 04/10/2016  . Counseling regarding end of life decision making 01/25/2016  . Atypical chest pain 04/16/2011  . DIVERTICULOSIS, COLON 12/03/2007    Past Medical history, Surgical history, Family history, Social history, Allergies and Medications have been entered into the medical record, reviewed and changed as needed.    Current Meds  Medication Sig  . albuterol (PROVENTIL HFA;VENTOLIN HFA) 108 (90 BASE) MCG/ACT inhaler Inhale 2 puffs into the lungs every 6 (six) hours as needed. Wheezing or shortness of breath  . ALPRAZolam (XANAX) 0.25 MG tablet Take 1 tablet (0.25 mg total) by mouth 2 (two) times daily as needed for anxiety.  Marland Kitchen apixaban (ELIQUIS) 5 MG TABS tablet Take 10 mg (2 tabs) twice a day till 05/22/16 and then from 05/23/16 take 1 tab (5 mg ) two times a day  . aspirin 325 MG EC tablet Take 325 mg by mouth daily.  . bisoprolol (ZEBETA) 10 MG tablet Take 1 tablet (10 mg total) by mouth daily.  . Calcium Carbonate-Vitamin D 600-400 MG-UNIT tablet  Take 2 tablets by mouth daily. (Patient taking differently: Take 1 tablet by mouth 2 (two) times daily. )  . escitalopram (LEXAPRO) 10 MG tablet TAKE 1 TABLET (10 MG TOTAL) BY MOUTH DAILY.  . fluticasone (FLONASE) 50 MCG/ACT nasal spray Place 2 sprays into the nose daily. Nasal congestion Rarely used   . furosemide (LASIX) 40 MG tablet Take 1 tablet (40 mg total) by mouth daily.  . isosorbide mononitrate (IMDUR) 30 MG 24 hr tablet Take 1 tablet (30 mg total) by mouth daily.  . naphazoline-pheniramine (NAPHCON-A) 0.025-0.3 % ophthalmic solution Place 1 drop into both eyes 4 (four) times daily as needed for irritation.  . nitroGLYCERIN (NITROSTAT) 0.3 MG SL tablet Place 0.3 mg under the tongue every 5 (five) minutes as needed for chest pain.    Allergies:  Allergies  Allergen Reactions  . Fenofibrate Other (See Comments)    Myalgias/gi upset   . Statins Other (See Comments)    Myalgias   . Sulfonamide Derivatives Nausea  And Vomiting    REACTION: nausea     Review of Systems: General:   Denies fever, chills, unexplained weight loss.  Optho/Auditory:   Denies visual changes, blurred vision/LOV Respiratory:   Denies wheeze, DOE more than baseline levels.  Cardiovascular:   Denies chest pain, palpitations, new onset peripheral edema  Gastrointestinal:   Denies nausea, vomiting, diarrhea, abd pain.  Genitourinary: Denies dysuria, freq/ urgency, flank pain or discharge from genitals.  Endocrine:     Denies hot or cold intolerance, polyuria, polydipsia. Musculoskeletal:   Denies unexplained myalgias, joint swelling, unexplained arthralgias, gait problems.  Skin:  Denies new onset rash, suspicious lesions Neurological:     Denies dizziness, unexplained weakness, numbness  Psychiatric/Behavioral:   Denies mood changes, suicidal or homicidal ideations, hallucinations    Objective:   Blood pressure 103/62, pulse (!) 55, temperature 97.4 F (36.3 C), temperature source Oral, weight 158 lb (71.7 kg), SpO2 98 %. Body mass index is 39.55 kg/m. General:  Well Developed, well nourished, appropriate for stated age.  Neuro:  Alert and oriented,  extra-ocular muscles intact  HEENT:  Normocephalic, atraumatic, neck supple, no carotid bruits appreciated  Skin:  no gross rash, warm, pink. Cardiac:  RRR, S1 S2 Respiratory:  ECTA B/L and A/P- only few crackles in bases b/l, Not using accessory muscles, speaking in full sentences- unlabored. Vascular:  Ext warm, no cyanosis apprec.; cap RF less 2 sec.  b/l Edema 1+ currently Psych:  No HI/SI, judgement and insight good, Euthymic mood. Full Affect.

## 2016-05-22 NOTE — Discharge Summary (Signed)
[facility] [place] NAME: Wanda Davenport    MR#:  [id number]  DATE OF BIRTH:  05-08-1923  DATE OF ADMISSION:  05/19/2016 ADMITTING PHYSICIAN: Lance Coon, MD  DATE OF DISCHARGE: 05/21/2016  2:02 PM  PRIMARY CARE PHYSICIAN: Mellody Dance, DO   ADMISSION DIAGNOSIS:  Acute respiratory failure with hypoxia ([place]) [J96.01] Acute on chronic congestive heart failure, unspecified heart failure type ([place]) [I50.9]  DISCHARGE DIAGNOSIS:  Principal Problem:   Acute on chronic combined systolic and diastolic CHF (congestive heart failure) (HCC) Active Problems:   HTN (hypertension)   Hyperlipidemia   GAD (generalized anxiety disorder)   PE (pulmonary thromboembolism) ([place])   SECONDARY DIAGNOSIS:   Past Medical History:  Diagnosis Date  . Allergic rhinitis   . Anxiety   . Arthritis   . Atypical chest pain   . Bronchitis 01/14/2016  . Chronic diastolic CHF (congestive heart failure) (H[facility])    Echo 06/2010 EF 60-65%, no RWMAs, grade 1 diastolic dysfunction, mild LAE, PASP 8mmHg.  Marland Kitchen DIVERTICULOSIS, COLON   . HEMORRHOIDS, INTERNAL   . Hepatic steatosis   . Hyperlipidemia   . HYPERTENSION   . LBBB (left bundle branch block)    a. present on ECG 06/2010  . Osteopenia   . Pre-diabetes    A1c 6.0     ADMITTING HISTORY  HISTORY OF PRESENT ILLNESS:  Wanda Davenport  is a 83 y.o. female who presents with Onset of shortness of breath today. Patient states that she feels like she's also been having some increasing abdominal swelling. She recently had myocardial infarction. Workup here in the ED today suggests heart failure. Hospitalists were called for admission  HOSPITAL COURSE:   * Acute on chronic systolic heart failure With acute hypoxic respiratory failure Treated with IV Lasix in the hospital. She diuresed well. Potassium replaced as needed. Change to oral Lasix at discharge. No crackles on examination on day of discharge and no lower  extremity edema. Fluid and salt restrictions explained. Daily weight. Referred to heart failure clinic.  # Acute respiratory failure with hypoxia secondary to CHF exacerbation Improved but still needing oxygen on ambulation. Home health oxygen set up.  # Recently diagnosed pulmonary embolus, DVT On Eliquis  # Hyperlipidemia  Stable for discharge home  CONSULTS OBTAINED:  Treatment Team:  Corey Skains, MD  DRUG ALLERGIES:   Allergies  Allergen Reactions  . Fenofibrate Other (See Comments)    Myalgias/gi upset   . Statins Other (See Comments)    Myalgias   . Sulfonamide Derivatives Nausea And Vomiting    REACTION: nausea    DISCHARGE MEDICATIONS:   Discharge Medication List as of 05/21/2016  1:45 PM    START taking these medications   Details  furosemide (LASIX) 40 MG tablet Take 1 tablet (40 mg total) by mouth daily., Starting Mon 05/21/2016, Until Tue 05/21/2017, Normal      CONTINUE these medications which have NOT CHANGED   Details  albuterol (PROVENTIL HFA;VENTOLIN HFA) 108 (90 BASE) MCG/ACT inhaler Inhale 2 puffs into the lungs every 6 (six) hours as needed. Wheezing or shortness of breath, Historical Med    ALPRAZolam (XANAX) 0.25 MG tablet Take 1 tablet (0.25 mg total) by mouth 2 (two) times daily as needed for anxiety., Starting Mon 04/30/2016, Print    apixaban (ELIQUIS) 5 MG TABS tablet Take 10 mg (2 tabs) twice a day till 05/22/16 and then from 05/23/16 take 1 tab (5 mg ) two times a  day, Normal    aspirin 325 MG EC tablet Take 325 mg by mouth daily., Historical Med    bisoprolol (ZEBETA) 10 MG tablet Take 1 tablet (10 mg total) by mouth daily., Starting Thu 03/29/2016, Normal    Calcium Carbonate-Vitamin D 600-400 MG-UNIT tablet Take 2 tablets by mouth daily., Starting Wed 01/25/2016, OTC    escitalopram (LEXAPRO) 10 MG tablet TAKE 1 TABLET (10 MG TOTAL) BY MOUTH DAILY., Starting Fri 04/27/2016, Normal    fluticasone (FLONASE) 50 MCG/ACT nasal spray  Place 2 sprays into the nose daily. Nasal congestion Rarely used , Historical Med    isosorbide mononitrate (IMDUR) 30 MG 24 hr tablet Take 1 tablet (30 mg total) by mouth daily., Starting Mon 04/30/2016, Normal    naphazoline-pheniramine (NAPHCON-A) 0.025-0.3 % ophthalmic solution Place 1 drop into both eyes 4 (four) times daily as needed for irritation., Historical Med    nitroGLYCERIN (NITROSTAT) 0.3 MG SL tablet Place 0.3 mg under the tongue every 5 (five) minutes as needed for chest pain., Historical Med      STOP taking these medications     lisinopril (PRINIVIL,ZESTRIL) 20 MG tablet         Today   VITAL SIGNS:  Blood pressure (!) 74/45, pulse 60, temperature 97.5 F (36.4 C), temperature source Oral, resp. rate 18, height 4\' 5"  (1.346 m), weight 70.7 kg (155 lb 12.8 oz), SpO2 95 %.  I/O:  No intake or output data in the 24 hours ending 05/22/16 1519  PHYSICAL EXAMINATION:  Physical Exam  GENERAL:  83 y.o.-year-old patient lying in the bed with no acute distress.  LUNGS: Normal breath sounds bilaterally, no wheezing, rales,rhonchi or crepitation. No use of accessory muscles of respiration.  CARDIOVASCULAR: S1, S2 normal. No murmurs, rubs, or gallops.  ABDOMEN: Soft, non-tender, non-distended. Bowel sounds present. No organomegaly or mass.  NEUROLOGIC: Moves all 4 extremities. PSYCHIATRIC: The patient is alert and oriented x 3.  SKIN: No obvious rash, lesion, or ulcer.   DATA REVIEW:   CBC  Recent Labs Lab 05/20/16 0431  WBC 9.2  HGB 11.3*  HCT 32.8*  PLT 197    Chemistries   Recent Labs Lab 05/21/16 0433  NA 134*  K 4.1  CL 101  CO2 27  GLUCOSE 94  BUN 30*  CREATININE 1.08*  CALCIUM 9.8    Cardiac Enzymes  Recent Labs Lab 05/19/16 2209  TROPONINI 0.05*    Microbiology Results  Results for orders placed or performed during the hospital encounter of 05/19/16  MRSA PCR Screening     Status: None   Collection Time: 05/20/16  1:35 AM   Result Value Ref Range Status   MRSA by PCR NEGATIVE NEGATIVE Final    Comment:        The GeneXpert MRSA Assay (FDA approved for NASAL specimens only), is one component of a comprehensive MRSA colonization surveillance program. It is not intended to diagnose MRSA infection nor to guide or monitor treatment for MRSA infections.     RADIOLOGY:  No results found.  Follow up with PCP in 1 week.  Management plans discussed with the patient, family and they are in agreement.  CODE STATUS:  Code Status History    Date Active Date Inactive Code Status Order ID Comments User Context   05/20/2016 12:47 AM 05/21/2016  5:02 PM Full Code [id number]  Lance Coon, MD Inpatient   05/15/2016 12:34 PM 05/17/2016  6:08 PM Full Code [id number]  Fritzi Mandes, MD Inpatient  05/05/2016  3:31 AM 05/06/2016  3:28 PM Full Code [id number]  Wellington Hampshire, MD Inpatient   04/26/2016  5:12 PM 05/01/2016  3:49 AM Full Code [id number]  Nicholes Mango, MD Inpatient   04/26/2016  2:08 PM 04/26/2016  5:12 PM Full Code [id number]  Isaias Cowman, MD Inpatient   09/23/2011  2:11 PM 09/26/2011  4:43 PM Full Code [id number]  Willette Pa, RN Inpatient    Advance Directive Documentation     Most Recent Value  Type of Advance Directive  Healthcare Power of Attorney  Pre-existing out of facility DNR order (yellow form or pink MOST form)  -  "MOST" Form in Place?  -      TOTAL TIME TAKING CARE OF THIS PATIENT ON DAY OF DISCHARGE: more than 30 minutes.   Hillary Bow R M.D on 05/22/2016 at 3:19 PM  Between 7am to 6pm - Pager - [phone]  After 6pm go to [email or web address] - password [email or web address] [place] [facility]  Office  [phone]  CC: Primary care physician; Mellody Dance, DO  Note: This dictation was prepared with Dragon dictation along with smaller phrase technology. Any transcriptional errors that result from this process are unintentional.

## 2016-05-23 DIAGNOSIS — R7303 Prediabetes: Secondary | ICD-10-CM | POA: Diagnosis not present

## 2016-05-23 DIAGNOSIS — K5730 Diverticulosis of large intestine without perforation or abscess without bleeding: Secondary | ICD-10-CM | POA: Diagnosis not present

## 2016-05-23 DIAGNOSIS — I429 Cardiomyopathy, unspecified: Secondary | ICD-10-CM | POA: Diagnosis not present

## 2016-05-23 DIAGNOSIS — M8580 Other specified disorders of bone density and structure, unspecified site: Secondary | ICD-10-CM | POA: Diagnosis not present

## 2016-05-23 DIAGNOSIS — I110 Hypertensive heart disease with heart failure: Secondary | ICD-10-CM | POA: Diagnosis not present

## 2016-05-23 DIAGNOSIS — I5032 Chronic diastolic (congestive) heart failure: Secondary | ICD-10-CM | POA: Diagnosis not present

## 2016-05-23 DIAGNOSIS — I447 Left bundle-branch block, unspecified: Secondary | ICD-10-CM | POA: Diagnosis not present

## 2016-05-23 DIAGNOSIS — I2102 ST elevation (STEMI) myocardial infarction involving left anterior descending coronary artery: Secondary | ICD-10-CM | POA: Diagnosis not present

## 2016-05-23 DIAGNOSIS — I350 Nonrheumatic aortic (valve) stenosis: Secondary | ICD-10-CM | POA: Diagnosis not present

## 2016-05-24 ENCOUNTER — Encounter: Payer: Self-pay | Admitting: *Deleted

## 2016-05-24 ENCOUNTER — Other Ambulatory Visit: Payer: Self-pay | Admitting: *Deleted

## 2016-05-24 DIAGNOSIS — I2102 ST elevation (STEMI) myocardial infarction involving left anterior descending coronary artery: Secondary | ICD-10-CM | POA: Diagnosis not present

## 2016-05-24 DIAGNOSIS — I447 Left bundle-branch block, unspecified: Secondary | ICD-10-CM | POA: Diagnosis not present

## 2016-05-24 DIAGNOSIS — I110 Hypertensive heart disease with heart failure: Secondary | ICD-10-CM | POA: Diagnosis not present

## 2016-05-24 DIAGNOSIS — I5032 Chronic diastolic (congestive) heart failure: Secondary | ICD-10-CM | POA: Diagnosis not present

## 2016-05-24 DIAGNOSIS — M8580 Other specified disorders of bone density and structure, unspecified site: Secondary | ICD-10-CM | POA: Diagnosis not present

## 2016-05-24 DIAGNOSIS — I429 Cardiomyopathy, unspecified: Secondary | ICD-10-CM | POA: Diagnosis not present

## 2016-05-24 DIAGNOSIS — K5730 Diverticulosis of large intestine without perforation or abscess without bleeding: Secondary | ICD-10-CM | POA: Diagnosis not present

## 2016-05-24 DIAGNOSIS — R7303 Prediabetes: Secondary | ICD-10-CM | POA: Diagnosis not present

## 2016-05-24 DIAGNOSIS — I350 Nonrheumatic aortic (valve) stenosis: Secondary | ICD-10-CM | POA: Diagnosis not present

## 2016-05-24 NOTE — Patient Outreach (Signed)
[facility]) Care Management  05/24/2016  Wanda Davenport 08-23-1923 [id number]   Transition of care call   Spoke with patient , reports she is doing much better today than yesterday states she just didn't feel well, nothing specific. Patient reports wearing oxygen at 2 liters.   Patient then placed her daughter Wanda Davenport on the phone to review medication list, she has placed medication in organizer per her recent discharge instructions. Daughter discussed patient recent admissions related to heart failure.  She verbalized understanding of low salt diet and keeping track of weights. She discussed patient scales at home are off, weighing 20 pounds different than weight at office on yesterday. They have borrowed those scales from patient son, unable to get new scales .   She states [place] visited today, listened to her lungs, examined her and everything seemed okay. They are awaiting tele monitoring scales by [employer] delivery on today. Stressed the importance of daily weights and notifying MD of weight gain greater than 3 pounds in a day and 5 in a week increased swelling or shortness of breath, she verbalized understanding of MD instructions regarding taking an extra lasix if she gains over 3 pounds in a day.  Reviewed discharge instructions with follow up appointment at heart failure clinic, this is patient initial visit, her son should be able to provide transportation.     Outpatient Encounter Prescriptions as of 05/24/2016  Medication Sig  . albuterol (PROVENTIL HFA;VENTOLIN HFA) 108 (90 BASE) MCG/ACT inhaler Inhale 2 puffs into the lungs every 6 (six) hours as needed. Wheezing or shortness of breath  . ALPRAZolam (XANAX) 0.25 MG tablet Take 1 tablet (0.25 mg total) by mouth 2 (two) times daily as needed for anxiety.  Marland Kitchen apixaban (ELIQUIS) 5 MG TABS tablet Take 10 mg (2 tabs) twice a day till 05/22/16 and then from 05/23/16 take 1 tab (5 mg ) two times a day  . aspirin 325 MG EC  tablet Take 325 mg by mouth daily.  . bisoprolol (ZEBETA) 10 MG tablet Take 1 tablet (10 mg total) by mouth daily.  . Calcium Carbonate-Vitamin D 600-400 MG-UNIT tablet Take 2 tablets by mouth daily. (Patient taking differently: Take 1 tablet by mouth 2 (two) times daily. )  . escitalopram (LEXAPRO) 10 MG tablet TAKE 1 TABLET (10 MG TOTAL) BY MOUTH DAILY.  . fluticasone (FLONASE) 50 MCG/ACT nasal spray Place 2 sprays into the nose daily. Nasal congestion Rarely used   . furosemide (LASIX) 40 MG tablet Take 1 tablet (40 mg total) by mouth daily.  . isosorbide mononitrate (IMDUR) 30 MG 24 hr tablet Take 1 tablet (30 mg total) by mouth daily.  . naphazoline-pheniramine (NAPHCON-A) 0.025-0.3 % ophthalmic solution Place 1 drop into both eyes 4 (four) times daily as needed for irritation.  . nitroGLYCERIN (NITROSTAT) 0.3 MG SL tablet Place 0.3 mg under the tongue every 5 (five) minutes as needed for chest pain.   No facility-administered encounter medications on file as of 05/24/2016.   Patient was recently discharged from hospital and all medications have been reviewed.   Plan Will continue to follow for weekly transition of care outreach, initial transition of care visit in next week. Will email [facility] assistant regarding arranging to have scales sent to patient home to have for use at end of home health services .  Placed call to [facility] [place] representative to ask if home health will return to home once tele scales arrive for setup, she explained  system is self explanatory and there is a number included in package to call to help guide patient through set up if they have questions, will update daughter of this information.     [name], RN, [place] Management [phone]- Mobile [phone]- Toll Free Main Office

## 2016-05-25 ENCOUNTER — Other Ambulatory Visit: Payer: Self-pay | Admitting: *Deleted

## 2016-05-25 ENCOUNTER — Telehealth: Payer: Self-pay

## 2016-05-25 DIAGNOSIS — I5043 Acute on chronic combined systolic (congestive) and diastolic (congestive) heart failure: Secondary | ICD-10-CM | POA: Diagnosis not present

## 2016-05-25 DIAGNOSIS — R0602 Shortness of breath: Secondary | ICD-10-CM | POA: Diagnosis not present

## 2016-05-25 DIAGNOSIS — I2699 Other pulmonary embolism without acute cor pulmonale: Secondary | ICD-10-CM | POA: Diagnosis not present

## 2016-05-25 NOTE — Patient Outreach (Addendum)
[facility]) Care Management  05/25/2016  VERGINIA TOOHEY [birth year] [id number]   Care Coordination call  Incoming call from [facility] liaison to inform that patient is not currently active with that agency for home health services.  Placed call to [facility] home health confirmed patient is active with agency for home health services and not with [facility]. Spoke with Leonia Reader she anticipates tele monitoring scale will be delivered by Tuesday May 29 , day of next [place] visit.   Plan Will place call to patient/daughter to inform patient  of tele monitoring scale scale delivery date and reinforce importance of taking medications as prescribed, notifying MD of sudden weight gains and shortness of breath.    [name], RN, [place] Management [phone]- Mobile [phone]- Toll Free Main Office

## 2016-05-25 NOTE — Telephone Encounter (Signed)
OK to provide verbal to them on this

## 2016-05-25 NOTE — Telephone Encounter (Signed)
Left message informing [facility] physical therapy was approved.

## 2016-05-25 NOTE — Telephone Encounter (Signed)
Wanda Davenport with [facility] called requesting orders for physical therapy orders twice a week for 6 weeks, for balance training and gait training.  As well as activity education.  Please call back with verbal order.

## 2016-05-29 ENCOUNTER — Ambulatory Visit: Payer: Medicare HMO | Admitting: Adult Health

## 2016-05-29 DIAGNOSIS — I429 Cardiomyopathy, unspecified: Secondary | ICD-10-CM | POA: Diagnosis not present

## 2016-05-29 DIAGNOSIS — I350 Nonrheumatic aortic (valve) stenosis: Secondary | ICD-10-CM | POA: Diagnosis not present

## 2016-05-29 DIAGNOSIS — M8580 Other specified disorders of bone density and structure, unspecified site: Secondary | ICD-10-CM | POA: Diagnosis not present

## 2016-05-29 DIAGNOSIS — I110 Hypertensive heart disease with heart failure: Secondary | ICD-10-CM | POA: Diagnosis not present

## 2016-05-29 DIAGNOSIS — R7303 Prediabetes: Secondary | ICD-10-CM | POA: Diagnosis not present

## 2016-05-29 DIAGNOSIS — I2102 ST elevation (STEMI) myocardial infarction involving left anterior descending coronary artery: Secondary | ICD-10-CM | POA: Diagnosis not present

## 2016-05-29 DIAGNOSIS — I5032 Chronic diastolic (congestive) heart failure: Secondary | ICD-10-CM | POA: Diagnosis not present

## 2016-05-29 DIAGNOSIS — K5730 Diverticulosis of large intestine without perforation or abscess without bleeding: Secondary | ICD-10-CM | POA: Diagnosis not present

## 2016-05-29 DIAGNOSIS — I447 Left bundle-branch block, unspecified: Secondary | ICD-10-CM | POA: Diagnosis not present

## 2016-05-30 ENCOUNTER — Ambulatory Visit: Payer: Medicare HMO | Admitting: Family

## 2016-05-31 ENCOUNTER — Encounter: Payer: Self-pay | Admitting: *Deleted

## 2016-05-31 ENCOUNTER — Other Ambulatory Visit: Payer: Self-pay | Admitting: *Deleted

## 2016-05-31 DIAGNOSIS — I110 Hypertensive heart disease with heart failure: Secondary | ICD-10-CM | POA: Diagnosis not present

## 2016-05-31 DIAGNOSIS — I5032 Chronic diastolic (congestive) heart failure: Secondary | ICD-10-CM | POA: Diagnosis not present

## 2016-05-31 DIAGNOSIS — I429 Cardiomyopathy, unspecified: Secondary | ICD-10-CM | POA: Diagnosis not present

## 2016-05-31 DIAGNOSIS — K5730 Diverticulosis of large intestine without perforation or abscess without bleeding: Secondary | ICD-10-CM | POA: Diagnosis not present

## 2016-05-31 DIAGNOSIS — I2102 ST elevation (STEMI) myocardial infarction involving left anterior descending coronary artery: Secondary | ICD-10-CM | POA: Diagnosis not present

## 2016-05-31 DIAGNOSIS — I447 Left bundle-branch block, unspecified: Secondary | ICD-10-CM | POA: Diagnosis not present

## 2016-05-31 DIAGNOSIS — R7303 Prediabetes: Secondary | ICD-10-CM | POA: Diagnosis not present

## 2016-05-31 DIAGNOSIS — I350 Nonrheumatic aortic (valve) stenosis: Secondary | ICD-10-CM | POA: Diagnosis not present

## 2016-05-31 DIAGNOSIS — M8580 Other specified disorders of bone density and structure, unspecified site: Secondary | ICD-10-CM | POA: Diagnosis not present

## 2016-05-31 NOTE — Patient Outreach (Addendum)
Wanda Davenport) Care Management   05/31/2016  Wanda Davenport 03/22/1923 [id number]  Wanda Davenport is an 83 y.o. female  Subjective:  Patient discussed she is feeling better today than yesterday. Reports not feeling good on yesterday she had chest tightness, woke up early and took 1 nitroglycerin, did not tell daughter, she  Reports patient had  extra fluid also on yesterday weight 157, she gave her an extra lasix as recommended by  MD on discharge instruction she had gained 3 pounds.   Today's weight is down to 154 per her home scales.  Patient discussed [facility] home monitoring system has arrived and they just need help setting it up.  Patient reports using her rescue inhaler once this morning with good relief.  Daughter reports patient not using rolling walker in home, she will be glad when home health therapy visit.    Objective:  BP 120/80 (BP Location: Right Arm, Patient Position: Sitting, Cuff Size: Normal)   Pulse 76   Resp 18   SpO2 97%  Patient sitting in recliner chair, oxygen at 2 liters. Patient used walker for ambulation in home upon request during exam, slightly winded after walk improved after rest.    Review of Systems  Constitutional: Negative.   HENT: Negative.   Eyes: Negative.   Respiratory: Negative.        Slightly short of breath after walk in home , improved at rest in chair, denies increase in shortness of breath,talking in complete sentences.  Gastrointestinal: Negative.   Genitourinary: Negative.   Musculoskeletal: Negative.   Skin: Negative.   Neurological: Negative.   Endo/Heme/Allergies: Negative.   Psychiatric/Behavioral: Negative.     Physical Exam  Constitutional: She is oriented to person, place, and time. She appears well-developed and well-nourished.  Cardiovascular: Normal rate, normal heart sounds and intact distal pulses.   Respiratory: Effort normal. She has rales in the right lower field and the left lower field.   GI: Soft. Bowel sounds are normal.  Neurological: She is alert and oriented to person, place, and time.  Skin: Skin is warm and dry.  Psychiatric: She has a normal mood and affect. Her behavior is normal. Judgment and thought content normal.    Encounter Medications:   Outpatient Encounter Prescriptions as of 05/31/2016  Medication Sig  . albuterol (PROVENTIL HFA;VENTOLIN HFA) 108 (90 BASE) MCG/ACT inhaler Inhale 2 puffs into the lungs every 6 (six) hours as needed. Wheezing or shortness of breath  . ALPRAZolam (XANAX) 0.25 MG tablet Take 1 tablet (0.25 mg total) by mouth 2 (two) times daily as needed for anxiety.  Marland Kitchen apixaban (ELIQUIS) 5 MG TABS tablet Take 10 mg (2 tabs) twice a day till 05/22/16 and then from 05/23/16 take 1 tab (5 mg ) two times a day  . aspirin 325 MG EC tablet Take 325 mg by mouth daily.  . bisoprolol (ZEBETA) 10 MG tablet Take 1 tablet (10 mg total) by mouth daily.  . Calcium Carbonate-Vitamin D 600-400 MG-UNIT tablet Take 2 tablets by mouth daily. (Patient taking differently: Take 1 tablet by mouth 2 (two) times daily. )  . escitalopram (LEXAPRO) 10 MG tablet TAKE 1 TABLET (10 MG TOTAL) BY MOUTH DAILY.  . fluticasone (FLONASE) 50 MCG/ACT nasal spray Place 2 sprays into the nose daily. Nasal congestion Rarely used   . furosemide (LASIX) 40 MG tablet Take 1 tablet (40 mg total) by mouth daily.  . isosorbide mononitrate (IMDUR) 30 MG 24 hr tablet Take 1  tablet (30 mg total) by mouth daily.  . naphazoline-pheniramine (NAPHCON-A) 0.025-0.3 % ophthalmic solution Place 1 drop into both eyes 4 (four) times daily as needed for irritation.  . nitroGLYCERIN (NITROSTAT) 0.3 MG SL tablet Place 0.3 mg under the tongue every 5 (five) minutes as needed for chest pain.   No facility-administered encounter medications on file as of 05/31/2016.   Patient was recently discharged from hospital and all medications have been reviewed.  Functional Status:   In your present state of health,  do you have any difficulty performing the following activities: 05/31/2016 05/20/2016  Hearing? Y N  Vision? N N  Difficulty concentrating or making decisions? N N  Walking or climbing stairs? Y N  Dressing or bathing? N N  Doing errands, shopping? Y N  Preparing Food and eating ? Y -  Using the Toilet? N -  In the past six months, have you accidently leaked urine? N -  Do you have problems with loss of bowel control? N -  Managing your Medications? Y -  Managing your Finances? Y -  Housekeeping or managing your Housekeeping? Y -  Some recent data might be hidden    Fall/Depression Screening:    Fall Risk  05/31/2016 05/24/2016 05/22/2016  Falls in the past year? No No No  Risk for fall due to : Impaired balance/gait Impaired balance/gait -   PHQ 2/9 Scores 05/31/2016 05/22/2016 12/13/2015 10/31/2015  PHQ - 2 Score 0 0 3 3  PHQ- 9 Score - 2 12 11    Assessment:   Transition of care home visit, her daughter [name] present. [facility] anticipating home health RN visit on today and physical therapy on tomorrow..   Social  Patient prefers living in her home, her daughter is currently staying with her, but is going out of town on next week. Son lives down the road from her. Family discussing hiring someone, family/acquaintance to stay with patient for a while. Patient prefers staying alone, not having strangers in her home. Patient considering life alert system.  Patient reports she has a living will she signed a few years ago, her son will look for copy.     Heart Failure - weight down from previous day, reports in green zone when chart reviewed. Home health to set up tele monitoring scales. Need reinforcement on education on Heart failure systems , and action plan. Daughter reports not adding salt to food, rinsing canned vegetables.   Fall Risk Doesn't feel like she needs the rolling walker, home physical therapy to begin 6/1 to help with strengthening.  Reinforced use of walker for fall prevention.   Recent MI and Pulmonary embolus Taking daily medications, daughter in law fills weekly pill organizer. Patient keeps rescue inhaler nearby, as well as nitroglycerin - reinforced on tablet at a time, reinforced how to take, and make sure sitting or lying down. Encouraged patient to let her daughter know if she is requiring nitroglycerin and increased chest pain .    Plan:  [facility] packet reviewed. Provided [facility] medic alert system contact information  Received return call from daughter stating [facility] has setup and educated on use of tele monitoring system Educated to notify MD of increased weight gain greater than 2-3 pounds in a day, 5 in week,swelling , chest pain, shortness of breath. Provided and reviewed Hi/low salt handout .  Instructed on fall prevention measures encouraged to use her walker. Will continue weekly outreach as part of transition of  care, next call in a week.  Will send PCP this visit note.     THN CM Care Plan Problem One     Most Recent Value  Care Plan Problem One  Recent hospital admission related to pulmonary embolism   Role Documenting the Problem One  Care Management [place] for Problem One  Active  THN Long Term Goal   Patient will not experience a hospital admission in the next 60 days   THN Long Term Goal Start Date  05/24/16 [goal date restarted due to readmission]  Interventions for Problem One Long Term Goal  Reinforced importance of taking medications as prescribed, how to use nitroglyerin and impotance of notifying MD of increased chest pain or shortness of breath   THN CM Short Term Goal #1   Patient will attend all medical appointments in the next 30 days   THN CM Short Term Goal #1 Start Date  05/18/16  Interventions for Short Term Goal #1  Reinforced to keep all medical appointments.   THN CM Short Term Goal #2   Patient will begin to weigh daily and keep a record in the next 30 days   THN CM  Short Term Goal #2 Start Date  05/24/16  Interventions for Short Term Goal #2  Reinforced best time to weigh, provided [facility] book and explained how to document and include not if symtpoms and take book to cardiac and HF visits   THN CM Short Term Goal #3  Patient will report increased knowledge of symptoms of heart failure and action plan to follow  in the next 30 days   THN CM Short Term Goal #3 Start Date  05/24/16  Interventions for Short Tern Goal #3  Provided EMMI handout on HF when to call MD, Reviewed Living with heart failure zone chart information       [name], RN, [place] Management [phone]- Mobile [phone]- [place] Office

## 2016-06-01 DIAGNOSIS — M8580 Other specified disorders of bone density and structure, unspecified site: Secondary | ICD-10-CM | POA: Diagnosis not present

## 2016-06-01 DIAGNOSIS — I429 Cardiomyopathy, unspecified: Secondary | ICD-10-CM | POA: Diagnosis not present

## 2016-06-01 DIAGNOSIS — I2102 ST elevation (STEMI) myocardial infarction involving left anterior descending coronary artery: Secondary | ICD-10-CM | POA: Diagnosis not present

## 2016-06-01 DIAGNOSIS — K5730 Diverticulosis of large intestine without perforation or abscess without bleeding: Secondary | ICD-10-CM | POA: Diagnosis not present

## 2016-06-01 DIAGNOSIS — R7303 Prediabetes: Secondary | ICD-10-CM | POA: Diagnosis not present

## 2016-06-01 DIAGNOSIS — I110 Hypertensive heart disease with heart failure: Secondary | ICD-10-CM | POA: Diagnosis not present

## 2016-06-01 DIAGNOSIS — I447 Left bundle-branch block, unspecified: Secondary | ICD-10-CM | POA: Diagnosis not present

## 2016-06-01 DIAGNOSIS — I350 Nonrheumatic aortic (valve) stenosis: Secondary | ICD-10-CM | POA: Diagnosis not present

## 2016-06-01 DIAGNOSIS — I5032 Chronic diastolic (congestive) heart failure: Secondary | ICD-10-CM | POA: Diagnosis not present

## 2016-06-04 ENCOUNTER — Other Ambulatory Visit: Payer: Self-pay | Admitting: *Deleted

## 2016-06-04 DIAGNOSIS — I447 Left bundle-branch block, unspecified: Secondary | ICD-10-CM | POA: Diagnosis not present

## 2016-06-04 DIAGNOSIS — I2109 ST elevation (STEMI) myocardial infarction involving other coronary artery of anterior wall: Secondary | ICD-10-CM | POA: Insufficient documentation

## 2016-06-04 DIAGNOSIS — I2699 Other pulmonary embolism without acute cor pulmonale: Secondary | ICD-10-CM | POA: Diagnosis not present

## 2016-06-04 DIAGNOSIS — I25118 Atherosclerotic heart disease of native coronary artery with other forms of angina pectoris: Secondary | ICD-10-CM | POA: Diagnosis not present

## 2016-06-04 DIAGNOSIS — I10 Essential (primary) hypertension: Secondary | ICD-10-CM | POA: Diagnosis not present

## 2016-06-04 DIAGNOSIS — E782 Mixed hyperlipidemia: Secondary | ICD-10-CM | POA: Diagnosis not present

## 2016-06-04 NOTE — Patient Outreach (Addendum)
[place] [facility]) Care Management  06/04/2016  MAYCE NOYES Jan 22, 1923 [id number]   Placed call to patient to follow up regarding call to nurse line on 6/3. No answer able to leave a message requesting a return call. Noted patient had scheduled office visit with cardiologist on today.  Plan Will plan out reach call in the next business day, if no return call on today.   1725 Return call from  [name] daughter of patient , she discussed call to 44 nurse line on 6/3 she reports the oxygen saturation part of the tele monitoring system provided by [facility] was not working , not picking up accurate reading. She denies patient having any increase in shortness of breath no weight gain greater than 2 pounds in a day. She has spoken with [facility] and anticipate RN to visit and assist with system. Daughter discussed the weights and blood pressure reading part of the system is working. Patient weight today is 157.2 and yesterday weight is 156.8 , she denies patient having increase of shortness breath and denies chest pain.  Daughter discussed patient visit to PCP on today, no medication changes will follow up in 2 weeks. Daughter discussed with MD patient staying alone, daughter states it concerns her but patient prefers to stay alone. . Family has arranged  someone to stay with her some during the day if  patient agrees , daughter discussed she has received list of sitter agencies,but cost is a concern. Patients' son , daughter in law live near by as well as patient sister, will check on her daily. Patient has not agreed to medical alert system yet . Patient daughter that has been staying with her will be out of town for  one week.   Plan  Will follow up with patient by telephone in the next 3 days as part of weekly transition of care outreach.    [name], RN, [place] Management [phone]- Mobile [phone]- Toll Free Main Office

## 2016-06-05 DIAGNOSIS — I5032 Chronic diastolic (congestive) heart failure: Secondary | ICD-10-CM | POA: Diagnosis not present

## 2016-06-05 DIAGNOSIS — R7303 Prediabetes: Secondary | ICD-10-CM | POA: Diagnosis not present

## 2016-06-05 DIAGNOSIS — I2102 ST elevation (STEMI) myocardial infarction involving left anterior descending coronary artery: Secondary | ICD-10-CM | POA: Diagnosis not present

## 2016-06-05 DIAGNOSIS — I429 Cardiomyopathy, unspecified: Secondary | ICD-10-CM | POA: Diagnosis not present

## 2016-06-05 DIAGNOSIS — I447 Left bundle-branch block, unspecified: Secondary | ICD-10-CM | POA: Diagnosis not present

## 2016-06-05 DIAGNOSIS — M8580 Other specified disorders of bone density and structure, unspecified site: Secondary | ICD-10-CM | POA: Diagnosis not present

## 2016-06-05 DIAGNOSIS — I110 Hypertensive heart disease with heart failure: Secondary | ICD-10-CM | POA: Diagnosis not present

## 2016-06-05 DIAGNOSIS — I350 Nonrheumatic aortic (valve) stenosis: Secondary | ICD-10-CM | POA: Diagnosis not present

## 2016-06-05 DIAGNOSIS — K5730 Diverticulosis of large intestine without perforation or abscess without bleeding: Secondary | ICD-10-CM | POA: Diagnosis not present

## 2016-06-07 ENCOUNTER — Other Ambulatory Visit: Payer: Self-pay | Admitting: *Deleted

## 2016-06-07 DIAGNOSIS — I350 Nonrheumatic aortic (valve) stenosis: Secondary | ICD-10-CM | POA: Diagnosis not present

## 2016-06-07 DIAGNOSIS — M8580 Other specified disorders of bone density and structure, unspecified site: Secondary | ICD-10-CM | POA: Diagnosis not present

## 2016-06-07 DIAGNOSIS — I447 Left bundle-branch block, unspecified: Secondary | ICD-10-CM | POA: Diagnosis not present

## 2016-06-07 DIAGNOSIS — I429 Cardiomyopathy, unspecified: Secondary | ICD-10-CM | POA: Diagnosis not present

## 2016-06-07 DIAGNOSIS — K5730 Diverticulosis of large intestine without perforation or abscess without bleeding: Secondary | ICD-10-CM | POA: Diagnosis not present

## 2016-06-07 DIAGNOSIS — I2102 ST elevation (STEMI) myocardial infarction involving left anterior descending coronary artery: Secondary | ICD-10-CM | POA: Diagnosis not present

## 2016-06-07 DIAGNOSIS — I110 Hypertensive heart disease with heart failure: Secondary | ICD-10-CM | POA: Diagnosis not present

## 2016-06-07 DIAGNOSIS — R7303 Prediabetes: Secondary | ICD-10-CM | POA: Diagnosis not present

## 2016-06-07 DIAGNOSIS — I5032 Chronic diastolic (congestive) heart failure: Secondary | ICD-10-CM | POA: Diagnosis not present

## 2016-06-07 NOTE — Patient Outreach (Signed)
[place] [facility]) Care Management  06/07/2016  NYCHELLE CASSATA Dec 20, 1923 [id number]  Transition of care call  Spoke with patient discussed she is feeling better now than earlier in the morning. She believes she had some extra fluid on and she took and extra fluid pill when home health RN visited on this morning. Patient states her weight today was 158 and yesterday she weighed 157, she denies swelling, chest pain or  shortness of breath today, reviewed MD instructions regarding extra fluid pill if she gain 3 pounds.  Patient reports wearing oxygen at 2 liters off and on, she denies shortness of breath or wheezing, talking in complete sentences. She has participated with home health physical therapy on today and is looking forward to getting stronger and walking outside. Reinforced to use her walker and not to attempt going outside without assistance , she verbalized understanding.  Patient discussed weighing daily and keeping a record of weights. Patient denies having any chest pain and has not required nitroglycerin, reviewed how to take with teachback.  Patient discussed that since her daughter is out of town her sister is staying with her during the day and her granddaughter at night,until her daughter return on Monday she states it is good having someone in the home. Patient verified she has received [employer] delivered meals.   Patient denied any new concerns at this time.  Plan Will follow up with patient in next week for weekly transition of care call Patient to notify MD of worsening of shortness of breath,swelling,  weights gains greater than 3 pounds in a day or 5 in a week, increased chest pain and 911 for emergency.   [name], RN, [place] Management [phone]- Mobile [phone]- Toll Free Main Office

## 2016-06-12 ENCOUNTER — Other Ambulatory Visit: Payer: Self-pay | Admitting: *Deleted

## 2016-06-12 DIAGNOSIS — M8580 Other specified disorders of bone density and structure, unspecified site: Secondary | ICD-10-CM | POA: Diagnosis not present

## 2016-06-12 DIAGNOSIS — I5032 Chronic diastolic (congestive) heart failure: Secondary | ICD-10-CM | POA: Diagnosis not present

## 2016-06-12 DIAGNOSIS — I447 Left bundle-branch block, unspecified: Secondary | ICD-10-CM | POA: Diagnosis not present

## 2016-06-12 DIAGNOSIS — I2102 ST elevation (STEMI) myocardial infarction involving left anterior descending coronary artery: Secondary | ICD-10-CM | POA: Diagnosis not present

## 2016-06-12 DIAGNOSIS — I110 Hypertensive heart disease with heart failure: Secondary | ICD-10-CM | POA: Diagnosis not present

## 2016-06-12 DIAGNOSIS — I350 Nonrheumatic aortic (valve) stenosis: Secondary | ICD-10-CM | POA: Diagnosis not present

## 2016-06-12 DIAGNOSIS — I429 Cardiomyopathy, unspecified: Secondary | ICD-10-CM | POA: Diagnosis not present

## 2016-06-12 DIAGNOSIS — R7303 Prediabetes: Secondary | ICD-10-CM | POA: Diagnosis not present

## 2016-06-12 DIAGNOSIS — K5730 Diverticulosis of large intestine without perforation or abscess without bleeding: Secondary | ICD-10-CM | POA: Diagnosis not present

## 2016-06-12 NOTE — Patient Outreach (Signed)
[facility]) Care Management  06/12/2016  DONNAJEAN CHESNUT 07-31-1923 [id number]  Transition of care call  Spoke with patient reports she is bothered  by allergies on today, she reports sneezing earlier this morning getting some better now.   Patient reports she continues to weigh daily, today's weight is 161, and yesterday's weight 160 denies having to take extra lasix related to increase weight greater than 3 pounds in the 5 days.  Patient denies increase in swelling, she continues to have some shortness of breath with walking relief after resting in chair. Patient reports she continues to wear oxygen at 2 liters. Patient denies chest pain or recent need for sl nitroglycerin.  [facility] home [facility] RN and PT visited today per patient. Patient discussed her upcoming visit at heart failure clinic on Thursday, states she may cancel the appointment since she sees cardiologist on Monday, 6/18. Discussed benefits of follow up at heart failure clinic. Patient daughter is back in town staying with patient now , but patient's sister or sister in law may provide transportation to visit.  Plan Will continue weekly transition of care call, next call in a week.  [name], RN, [place] Management [phone]- Mobile [phone]- Toll Free Main Office

## 2016-06-14 ENCOUNTER — Encounter: Payer: Self-pay | Admitting: Family

## 2016-06-14 ENCOUNTER — Ambulatory Visit: Payer: Medicare HMO | Attending: Family | Admitting: Family

## 2016-06-14 VITALS — BP 100/53 | HR 66 | Resp 20 | Ht 64.0 in | Wt 162.1 lb

## 2016-06-14 DIAGNOSIS — Z7982 Long term (current) use of aspirin: Secondary | ICD-10-CM | POA: Insufficient documentation

## 2016-06-14 DIAGNOSIS — Z9981 Dependence on supplemental oxygen: Secondary | ICD-10-CM | POA: Diagnosis not present

## 2016-06-14 DIAGNOSIS — R7303 Prediabetes: Secondary | ICD-10-CM | POA: Insufficient documentation

## 2016-06-14 DIAGNOSIS — F419 Anxiety disorder, unspecified: Secondary | ICD-10-CM | POA: Insufficient documentation

## 2016-06-14 DIAGNOSIS — I5042 Chronic combined systolic (congestive) and diastolic (congestive) heart failure: Secondary | ICD-10-CM | POA: Insufficient documentation

## 2016-06-14 DIAGNOSIS — K5790 Diverticulosis of intestine, part unspecified, without perforation or abscess without bleeding: Secondary | ICD-10-CM | POA: Diagnosis not present

## 2016-06-14 DIAGNOSIS — I447 Left bundle-branch block, unspecified: Secondary | ICD-10-CM | POA: Diagnosis not present

## 2016-06-14 DIAGNOSIS — I2102 ST elevation (STEMI) myocardial infarction involving left anterior descending coronary artery: Secondary | ICD-10-CM | POA: Diagnosis not present

## 2016-06-14 DIAGNOSIS — K760 Fatty (change of) liver, not elsewhere classified: Secondary | ICD-10-CM | POA: Diagnosis not present

## 2016-06-14 DIAGNOSIS — Z87891 Personal history of nicotine dependence: Secondary | ICD-10-CM | POA: Diagnosis not present

## 2016-06-14 DIAGNOSIS — I252 Old myocardial infarction: Secondary | ICD-10-CM | POA: Diagnosis not present

## 2016-06-14 DIAGNOSIS — I5032 Chronic diastolic (congestive) heart failure: Secondary | ICD-10-CM | POA: Diagnosis not present

## 2016-06-14 DIAGNOSIS — I429 Cardiomyopathy, unspecified: Secondary | ICD-10-CM | POA: Diagnosis not present

## 2016-06-14 DIAGNOSIS — Z882 Allergy status to sulfonamides status: Secondary | ICD-10-CM | POA: Diagnosis not present

## 2016-06-14 DIAGNOSIS — M8580 Other specified disorders of bone density and structure, unspecified site: Secondary | ICD-10-CM | POA: Insufficient documentation

## 2016-06-14 DIAGNOSIS — K5730 Diverticulosis of large intestine without perforation or abscess without bleeding: Secondary | ICD-10-CM | POA: Diagnosis not present

## 2016-06-14 DIAGNOSIS — I350 Nonrheumatic aortic (valve) stenosis: Secondary | ICD-10-CM | POA: Diagnosis not present

## 2016-06-14 DIAGNOSIS — I110 Hypertensive heart disease with heart failure: Secondary | ICD-10-CM | POA: Diagnosis not present

## 2016-06-14 DIAGNOSIS — E785 Hyperlipidemia, unspecified: Secondary | ICD-10-CM | POA: Diagnosis not present

## 2016-06-14 DIAGNOSIS — Z7901 Long term (current) use of anticoagulants: Secondary | ICD-10-CM | POA: Diagnosis not present

## 2016-06-14 DIAGNOSIS — I2510 Atherosclerotic heart disease of native coronary artery without angina pectoris: Secondary | ICD-10-CM | POA: Insufficient documentation

## 2016-06-14 DIAGNOSIS — I5022 Chronic systolic (congestive) heart failure: Secondary | ICD-10-CM

## 2016-06-14 DIAGNOSIS — I10 Essential (primary) hypertension: Secondary | ICD-10-CM

## 2016-06-14 DIAGNOSIS — Z79899 Other long term (current) drug therapy: Secondary | ICD-10-CM | POA: Insufficient documentation

## 2016-06-14 NOTE — Patient Instructions (Signed)
Continue weighing daily and call for an overnight weight gain of > 2 pounds or a weekly weight gain of >5 pounds. 

## 2016-06-14 NOTE — Progress Notes (Signed)
Patient ID: Wanda Davenport, female    DOB: [birth year], 83 y.o.   MRN: [id number]  HPI  Wanda Davenport is a 83 y/o female with a history of atypical chest pain, STEMI with stent, anxiety, diverticulosis, hepatic steatosis, hyperlipidemia, HTN, LBBB, osteopenia, remote tobacco use, chronic oxygen use and chronic heart failure  Reviewed last echo report from 04/26/16 which showed an EF of 35-40% along with mild MR/TR. EF has decreased from 2013 when it was 45-50%. Cardiac catheterization done 04/26/16 due to STEMI showing 100% stenosis of ost LAD to prox LAD. Drug eluting stent place with 0% residual stenosis post intervention. Another catheterization was done 05/05/16 and showed widely patent LAD stent without stent thrombosis.  Admitted 05/19/16 due to HF exacerbation. Treated with IV lasix. Discharged with oxygen along with home health after 2 days. Admitted 05/15/16 due to multiple emboli on the right and right lower DVT. Cardiology consult obtained. Discharged home after 2 days. Admitted 05/05/16 due to [place] and HF exacerbation. Catheterization done which showed a patent LAD stent. Diuresed with lasix. Discharged home with antibiotics due to PNA. Discharged the following day. Admitted 04/26/16 due to STEMI and new HF. Cardiology consult obtained and catheterization done with a LAD stent placed. Discharged after 4 days.   She presents today for her initial visit with a chief complaint of mild fatigue with mild exertion. She says that this has been present for several months now. She feels like this is improving over the last couple of months. She has associated congestion and wheezing along with this.   Past Medical History:  Diagnosis Date  . Allergic rhinitis   . Anxiety   . Arthritis   . Atypical chest pain   . Bronchitis 01/14/2016  . Chronic diastolic CHF (congestive heart failure) (HCC)    Echo 06/2010 EF 60-65%, no RWMAs, grade 1 diastolic dysfunction, mild LAE, PASP 38mmHg.  Marland Kitchen Coronary artery disease     MI  . DIVERTICULOSIS, COLON   . HEMORRHOIDS, INTERNAL   . Hepatic steatosis   . Hyperlipidemia   . HYPERTENSION   . LBBB (left bundle branch block)    a. present on ECG 06/2010  . Osteopenia   . Pre-diabetes    A1c 6.0   Past Surgical History:  Procedure Laterality Date  . ABDOMINAL HYSTERECTOMY    . BREAST SURGERY    . CATARACT EXTRACTION W/PHACO Left 05/12/2014   Procedure: CATARACT EXTRACTION PHACO AND INTRAOCULAR LENS PLACEMENT (IOC);  Surgeon: Leandrew Koyanagi, MD;  Location: [place];  Service: Ophthalmology;  Laterality: Left;  . COLONOSCOPY     a. 2010  . CORONARY STENT INTERVENTION N/A 04/26/2016   Procedure: Coronary Stent Intervention;  Surgeon: Isaias Cowman, MD;  Location: [place] CV LAB;  Service: Cardiovascular;  Laterality: N/A;  . LEFT HEART CATH AND CORONARY ANGIOGRAPHY N/A 04/26/2016   Procedure: Left Heart Cath and Coronary Angiography;  Surgeon: Isaias Cowman, MD;  Location: [place] [facility];  Service: Cardiovascular;  Laterality: N/A;  . LEFT HEART CATH AND CORONARY ANGIOGRAPHY N/A 05/05/2016   Procedure: Left Heart Cath and Coronary Angiography;  Surgeon: Wellington Hampshire, MD;  Location: [place] [facility];  Service: Cardiovascular;  Laterality: N/A;  . TONSILLECTOMY     Family History  Problem Relation Age of Onset  . Colon cancer Sister   . Melanoma Sister   . Hyperlipidemia Brother   . Hypertension Mother   . Lupus Mother        died  80 from surgical complications  . Alzheimer's disease Brother        unknown  . Kidney disease Father        died 22  . Hypertension Father   . Alcohol abuse Father    Social History  Substance Use Topics  . Smoking status: Former Smoker    Packs/day: 0.25    Years: 20.00    Types: Cigarettes    Quit date: 01/02/1971  . Smokeless tobacco: Never Used     Comment: smoked a few cigarettes/day x 20 yrs - quit @ age 26.  Marland Kitchen Alcohol use No   Allergies  Allergen Reactions  .  Fenofibrate Other (See Comments)    Myalgias/gi upset   . Statins Other (See Comments)    Myalgias   . Sulfonamide Derivatives Nausea And Vomiting    REACTION: nausea   Prior to Admission medications   Medication Sig Start Date End Date Taking? Authorizing Provider  albuterol (PROVENTIL HFA;VENTOLIN HFA) 108 (90 BASE) MCG/ACT inhaler Inhale 2 puffs into the lungs every 6 (six) hours as needed. Wheezing or shortness of breath   Yes [provider]  apixaban (ELIQUIS) 5 MG TABS tablet Take 10 mg (2 tabs) twice a day till 05/22/16 and then from 05/23/16 take 1 tab (5 mg ) two times a day 05/17/16  Yes Fritzi Mandes, MD  aspirin 325 MG EC tablet Take 325 mg by mouth daily.   Yes [provider]  bisoprolol (ZEBETA) 10 MG tablet Take 1 tablet (10 mg total) by mouth daily. 03/29/16  Yes Danford, Valetta Fuller D, NP  escitalopram (LEXAPRO) 10 MG tablet TAKE 1 TABLET (10 MG TOTAL) BY MOUTH DAILY. 04/27/16  Yes Opalski, Deborah, DO  fluticasone (FLONASE) 50 MCG/ACT nasal spray Place 2 sprays into the nose daily. Nasal congestion Rarely used    Yes [provider]  furosemide (LASIX) 40 MG tablet Take 1 tablet (40 mg total) by mouth daily. 05/21/16 05/21/17 Yes Sudini, Alveta Heimlich, MD  isosorbide mononitrate (IMDUR) 30 MG 24 hr tablet Take 1 tablet (30 mg total) by mouth daily. 04/30/16  Yes Fritzi Mandes, MD  montelukast (SINGULAIR) 10 MG tablet Take 10 mg by mouth at bedtime.   Yes [provider]  naphazoline-pheniramine (NAPHCON-A) 0.025-0.3 % ophthalmic solution Place 1 drop into both eyes 4 (four) times daily as needed for irritation.   Yes [provider]  nitroGLYCERIN (NITROSTAT) 0.3 MG SL tablet Place 0.3 mg under the tongue every 5 (five) minutes as needed for chest pain.   Yes [provider]    Review of Systems  Constitutional: Positive for fatigue. Negative for appetite change.  HENT: Positive for congestion and rhinorrhea. Negative for sore throat.    Eyes: Negative.   Respiratory: Positive for wheezing. Negative for chest tightness and shortness of breath.   Cardiovascular: Negative for chest pain, palpitations and leg swelling.  Gastrointestinal: Negative for abdominal distention and abdominal pain.  Endocrine: Negative.   Genitourinary: Negative.   Musculoskeletal: Negative for back pain and neck pain.  Skin: Negative.   Allergic/Immunologic: Negative.   Neurological: Negative for dizziness and light-headedness.  Hematological: Negative for adenopathy. Does not bruise/bleed easily.  Psychiatric/Behavioral: Negative for dysphoric mood and sleep disturbance (sleeping on 2 pillows with oxygen at 2L around the clock). The patient is not nervous/anxious.    Vitals:   06/14/16 1317  BP: (!) 100/53  Pulse: 66  Resp: 20  SpO2: 97%  Weight: 162 lb 2 oz (73.5 kg)  Height: 5\' 4"  (1.626 m)   Wt Readings from Last 3 Encounters:  06/14/16 162 lb 2 oz (73.5 kg)  05/22/16 158 lb (71.7 kg)  05/21/16 155 lb 12.8 oz (70.7 kg)   Lab Results  Component Value Date   CREATININE 1.08 (H) 05/21/2016   CREATININE 1.07 (H) 05/20/2016   CREATININE 1.00 05/19/2016    Physical Exam  Constitutional: She is oriented to person, place, and time. She appears well-developed and well-nourished.  HENT:  Head: Normocephalic and atraumatic.  Neck: Normal range of motion. Neck supple. No JVD present.  Cardiovascular: Normal rate and regular rhythm.   Pulmonary/Chest: Effort normal. She has no wheezes. She has no rales.  Abdominal: Soft. She exhibits no distension. There is no tenderness.  Musculoskeletal: She exhibits no edema or tenderness.  Neurological: She is alert and oriented to person, place, and time.  Skin: Skin is warm and dry.  Psychiatric: She has a normal mood and affect. Her behavior is normal. Thought content normal.  Nursing note and vitals reviewed.   Assessment & Plan:  1: Chronic heart failure with reduced ejection fraction- -  NYHA class II - euvolemic today - already weighing daily and she has parameters to take an extra furosemide for an overnight weight gain of 3 pounds or more. Also advised her to call for a weekly weight gain of >5 pounds - not adding salt and her daughter doesn't cook with salt either. Written dietary information given to her about following a 2000mg  sodium diet - may be limited in adding entresto due to blood pressure - currently has [facility] home health coming out - saw cardiologist Nehemiah Massed) 06/04/16 and returns 06/18/16  2: HTN- - BP on the low side - denies dizziness - recently saw PCP (Opalski) about 3 weeks ago  Medication bottles were reviewed.  Return here in 1 month or sooner for any questions/problems before then.

## 2016-06-15 ENCOUNTER — Encounter: Payer: Self-pay | Admitting: Family

## 2016-06-15 ENCOUNTER — Telehealth: Payer: Self-pay

## 2016-06-15 DIAGNOSIS — R05 Cough: Secondary | ICD-10-CM | POA: Diagnosis not present

## 2016-06-15 DIAGNOSIS — Z8701 Personal history of pneumonia (recurrent): Secondary | ICD-10-CM | POA: Diagnosis not present

## 2016-06-15 DIAGNOSIS — R0981 Nasal congestion: Secondary | ICD-10-CM | POA: Diagnosis not present

## 2016-06-15 DIAGNOSIS — I5022 Chronic systolic (congestive) heart failure: Secondary | ICD-10-CM | POA: Insufficient documentation

## 2016-06-15 NOTE — Telephone Encounter (Signed)
Patient daughter called on behalf of patient.  Patient complaining of head congestion, stopped up ear, cough and drainage.  She denies fever and shortness of breath.  Patient has used her inhaler, nasal spray and allergy medicine.  Patient feels she is getting worse since Tuesday.  Per Dr Raliegh Scarlet patient should go to urgent care and get chest xray and blood work.  Patient agreed.

## 2016-06-18 DIAGNOSIS — I2782 Chronic pulmonary embolism: Secondary | ICD-10-CM | POA: Diagnosis not present

## 2016-06-18 DIAGNOSIS — E782 Mixed hyperlipidemia: Secondary | ICD-10-CM | POA: Diagnosis not present

## 2016-06-18 DIAGNOSIS — I447 Left bundle-branch block, unspecified: Secondary | ICD-10-CM | POA: Diagnosis not present

## 2016-06-18 DIAGNOSIS — I2510 Atherosclerotic heart disease of native coronary artery without angina pectoris: Secondary | ICD-10-CM | POA: Diagnosis not present

## 2016-06-19 ENCOUNTER — Other Ambulatory Visit: Payer: Self-pay | Admitting: *Deleted

## 2016-06-19 DIAGNOSIS — K5730 Diverticulosis of large intestine without perforation or abscess without bleeding: Secondary | ICD-10-CM | POA: Diagnosis not present

## 2016-06-19 DIAGNOSIS — I429 Cardiomyopathy, unspecified: Secondary | ICD-10-CM | POA: Diagnosis not present

## 2016-06-19 DIAGNOSIS — M8580 Other specified disorders of bone density and structure, unspecified site: Secondary | ICD-10-CM | POA: Diagnosis not present

## 2016-06-19 DIAGNOSIS — I447 Left bundle-branch block, unspecified: Secondary | ICD-10-CM | POA: Diagnosis not present

## 2016-06-19 DIAGNOSIS — I5032 Chronic diastolic (congestive) heart failure: Secondary | ICD-10-CM | POA: Diagnosis not present

## 2016-06-19 DIAGNOSIS — I2102 ST elevation (STEMI) myocardial infarction involving left anterior descending coronary artery: Secondary | ICD-10-CM | POA: Diagnosis not present

## 2016-06-19 DIAGNOSIS — I350 Nonrheumatic aortic (valve) stenosis: Secondary | ICD-10-CM | POA: Diagnosis not present

## 2016-06-19 DIAGNOSIS — I110 Hypertensive heart disease with heart failure: Secondary | ICD-10-CM | POA: Diagnosis not present

## 2016-06-19 DIAGNOSIS — R7303 Prediabetes: Secondary | ICD-10-CM | POA: Diagnosis not present

## 2016-06-19 NOTE — Patient Outreach (Addendum)
[facility]) Care Management  06/19/2016  Wanda Davenport 1923/05/24 [id number]   Transition of care call  Spoke with patient states she is feeling pretty good on today. She still gets al little winded after walking but it gets better after resting, she is still wearing oxygen at 2 liters. Patient discussed home health PT visit on today felt tired after making 2nd round of walking.  Patient discussed she went to urgent care on 6/15 due to cough , nasal congestion and she has been prescribed doxycycline and symptoms are clearing up.  Patient discussed visit to cardiology on yesterday her daughter was with her , states they discussed if it was alright for patient to stay alone for a nights as her daughter will been out of town for 3 days, states per MD he doesn't see why she can't stay by herself. Patient reports she feels comfortable with staying alone, other family calls and checks on her each day. Patient states her daughter in law could stay with her if needed, and she will consider if needed.  Discussed medical alert system, patient states she is not interested at this time due to added monthly cost.  Patient continues to weigh daily, check her blood pressure and oxygen level daily, today's weight is 160.0 no swelling, denies chest pain , able to state how to use nitroglycerin if needed.   Plan Will continue weekly transition of care calls, next call in a week. Patient will complete full dose of antibiotics Patient to notify MD of increased/unresolved  shortness of breath,increased  chest pain an 911 for emergency.    [name], RN, [place] Management [phone]- Mobile [phone]- Toll Free Main Office

## 2016-06-20 ENCOUNTER — Other Ambulatory Visit: Payer: Self-pay | Admitting: *Deleted

## 2016-06-20 DIAGNOSIS — R7303 Prediabetes: Secondary | ICD-10-CM | POA: Diagnosis not present

## 2016-06-20 DIAGNOSIS — I2102 ST elevation (STEMI) myocardial infarction involving left anterior descending coronary artery: Secondary | ICD-10-CM | POA: Diagnosis not present

## 2016-06-20 DIAGNOSIS — I110 Hypertensive heart disease with heart failure: Secondary | ICD-10-CM | POA: Diagnosis not present

## 2016-06-20 DIAGNOSIS — I350 Nonrheumatic aortic (valve) stenosis: Secondary | ICD-10-CM | POA: Diagnosis not present

## 2016-06-20 DIAGNOSIS — I447 Left bundle-branch block, unspecified: Secondary | ICD-10-CM | POA: Diagnosis not present

## 2016-06-20 DIAGNOSIS — I5032 Chronic diastolic (congestive) heart failure: Secondary | ICD-10-CM | POA: Diagnosis not present

## 2016-06-20 DIAGNOSIS — K5730 Diverticulosis of large intestine without perforation or abscess without bleeding: Secondary | ICD-10-CM | POA: Diagnosis not present

## 2016-06-20 DIAGNOSIS — M8580 Other specified disorders of bone density and structure, unspecified site: Secondary | ICD-10-CM | POA: Diagnosis not present

## 2016-06-20 DIAGNOSIS — I429 Cardiomyopathy, unspecified: Secondary | ICD-10-CM | POA: Diagnosis not present

## 2016-06-20 NOTE — Patient Outreach (Signed)
[facility]) Care Management  06/20/2016  CASSIA FEIN 1923-04-21 [id number]  Incoming call from [name] daughter of patient , hipaa information verified. Daughter called from out of town regarding report she had received patient at home that she had vomited up mucous, discussed patient denies having increase of shortness of breath, oxygen at 2 liters and level 95 %. She also expressed concern regarding unsure of home health RN visit, stating she was visiting on Tuesday and Thursday and she did not visit on yesterday, only therapy and she wanted a RN to see patient on today. Daughter expressed her concern because she is out of town, patient staying at home alone at night per patient request, family available during the day to check on her.  She requested telephone number to [facility], which I provided  Plan follow up call to patient and [facility].  66 [facility] with [facility] that verified that they had spoken with Daughter and explained visit schedule for RN is weekly, and that RN visited on today. Placed call  to patient reports she is feeling better after coughing up phlegm this morning. Discussed family has been with her today, patient states she will be alright staying by herself at night acknowledges family member has agreed to stay with her if needed. Patient agrees to keep walker near by for use and her phone.    Plan Will follow up with weekly transition of care call in the next week.  Patient to seek medication attention for unresolved or  worsening of symptoms .   [name], RN, [place] Management [phone]- Mobile [phone]- Toll Free Main Office

## 2016-06-21 ENCOUNTER — Other Ambulatory Visit: Payer: Self-pay | Admitting: *Deleted

## 2016-06-21 DIAGNOSIS — I429 Cardiomyopathy, unspecified: Secondary | ICD-10-CM | POA: Diagnosis not present

## 2016-06-21 DIAGNOSIS — K5730 Diverticulosis of large intestine without perforation or abscess without bleeding: Secondary | ICD-10-CM | POA: Diagnosis not present

## 2016-06-21 DIAGNOSIS — I2699 Other pulmonary embolism without acute cor pulmonale: Secondary | ICD-10-CM | POA: Diagnosis not present

## 2016-06-21 DIAGNOSIS — I447 Left bundle-branch block, unspecified: Secondary | ICD-10-CM | POA: Diagnosis not present

## 2016-06-21 DIAGNOSIS — I5032 Chronic diastolic (congestive) heart failure: Secondary | ICD-10-CM | POA: Diagnosis not present

## 2016-06-21 DIAGNOSIS — I5043 Acute on chronic combined systolic (congestive) and diastolic (congestive) heart failure: Secondary | ICD-10-CM | POA: Diagnosis not present

## 2016-06-21 DIAGNOSIS — R7303 Prediabetes: Secondary | ICD-10-CM | POA: Diagnosis not present

## 2016-06-21 DIAGNOSIS — I110 Hypertensive heart disease with heart failure: Secondary | ICD-10-CM | POA: Diagnosis not present

## 2016-06-21 DIAGNOSIS — I2102 ST elevation (STEMI) myocardial infarction involving left anterior descending coronary artery: Secondary | ICD-10-CM | POA: Diagnosis not present

## 2016-06-21 DIAGNOSIS — M8580 Other specified disorders of bone density and structure, unspecified site: Secondary | ICD-10-CM | POA: Diagnosis not present

## 2016-06-21 DIAGNOSIS — I350 Nonrheumatic aortic (valve) stenosis: Secondary | ICD-10-CM | POA: Diagnosis not present

## 2016-06-21 DIAGNOSIS — R0602 Shortness of breath: Secondary | ICD-10-CM | POA: Diagnosis not present

## 2016-06-21 NOTE — Patient Outreach (Signed)
[facility]) Care Management  06/21/2016  Wanda Davenport 09-21-1923 [id number]   Received incoming message from [place] [facility], [place] regarding patient call to 24 nurse line, reviewed report dated from yesterday 6/20,  and concern related to patient vomiting mucous.Spoke with daughter [name] on yesterday as well as patient that reported she was feeling better by afternoon.  Placed call to patient today to follow up, no answer able to leave a message. Placed call to her daughter [name] that reports she has spoken with patient 3 or 4 times today and she was feeling better.   [name]  Returned call to patient reports she is feeling much much better on today, no new concerns.   Plan Will follow up with patient by telephone in the next week.   [name], RN, [place] Management [phone]- Mobile [phone]- Toll Free Main Office

## 2016-06-22 DIAGNOSIS — K5730 Diverticulosis of large intestine without perforation or abscess without bleeding: Secondary | ICD-10-CM | POA: Diagnosis not present

## 2016-06-22 DIAGNOSIS — I447 Left bundle-branch block, unspecified: Secondary | ICD-10-CM | POA: Diagnosis not present

## 2016-06-22 DIAGNOSIS — I350 Nonrheumatic aortic (valve) stenosis: Secondary | ICD-10-CM | POA: Diagnosis not present

## 2016-06-22 DIAGNOSIS — I2102 ST elevation (STEMI) myocardial infarction involving left anterior descending coronary artery: Secondary | ICD-10-CM | POA: Diagnosis not present

## 2016-06-22 DIAGNOSIS — I5032 Chronic diastolic (congestive) heart failure: Secondary | ICD-10-CM | POA: Diagnosis not present

## 2016-06-22 DIAGNOSIS — I429 Cardiomyopathy, unspecified: Secondary | ICD-10-CM | POA: Diagnosis not present

## 2016-06-22 DIAGNOSIS — I110 Hypertensive heart disease with heart failure: Secondary | ICD-10-CM | POA: Diagnosis not present

## 2016-06-22 DIAGNOSIS — R7303 Prediabetes: Secondary | ICD-10-CM | POA: Diagnosis not present

## 2016-06-22 DIAGNOSIS — M8580 Other specified disorders of bone density and structure, unspecified site: Secondary | ICD-10-CM | POA: Diagnosis not present

## 2016-06-25 DIAGNOSIS — I2699 Other pulmonary embolism without acute cor pulmonale: Secondary | ICD-10-CM | POA: Diagnosis not present

## 2016-06-25 DIAGNOSIS — R0602 Shortness of breath: Secondary | ICD-10-CM | POA: Diagnosis not present

## 2016-06-25 DIAGNOSIS — I5043 Acute on chronic combined systolic (congestive) and diastolic (congestive) heart failure: Secondary | ICD-10-CM | POA: Diagnosis not present

## 2016-06-26 ENCOUNTER — Other Ambulatory Visit: Payer: Self-pay | Admitting: *Deleted

## 2016-06-26 DIAGNOSIS — R7303 Prediabetes: Secondary | ICD-10-CM | POA: Diagnosis not present

## 2016-06-26 DIAGNOSIS — I110 Hypertensive heart disease with heart failure: Secondary | ICD-10-CM | POA: Diagnosis not present

## 2016-06-26 DIAGNOSIS — I350 Nonrheumatic aortic (valve) stenosis: Secondary | ICD-10-CM | POA: Diagnosis not present

## 2016-06-26 DIAGNOSIS — I2102 ST elevation (STEMI) myocardial infarction involving left anterior descending coronary artery: Secondary | ICD-10-CM | POA: Diagnosis not present

## 2016-06-26 DIAGNOSIS — M8580 Other specified disorders of bone density and structure, unspecified site: Secondary | ICD-10-CM | POA: Diagnosis not present

## 2016-06-26 DIAGNOSIS — K5730 Diverticulosis of large intestine without perforation or abscess without bleeding: Secondary | ICD-10-CM | POA: Diagnosis not present

## 2016-06-26 DIAGNOSIS — I5032 Chronic diastolic (congestive) heart failure: Secondary | ICD-10-CM | POA: Diagnosis not present

## 2016-06-26 DIAGNOSIS — I429 Cardiomyopathy, unspecified: Secondary | ICD-10-CM | POA: Diagnosis not present

## 2016-06-26 DIAGNOSIS — I447 Left bundle-branch block, unspecified: Secondary | ICD-10-CM | POA: Diagnosis not present

## 2016-06-26 NOTE — Patient Outreach (Signed)
[facility]) Care Management  06/26/2016  ZACHARY LOVINS [birth year] [id number]  Transition of care call  Spoke with patient reports she is feeling pretty good, discussed sinus drainage symptoms have resolved . Patient reports she continues to wear her oxygen, as her breathing still gets a little short after walking. She reports home health RN visited today and decreased oxygen from 2 liters down to 1 liter. Patient reports she continues to weigh daily, monitor blood pressure and oxygen level, denies increases in weight or swelling, today weight is 160.  Patient denies chest pain and need to take nitroglycerin. Patient reports her daughter is back in town now staying with her and that helps her feel more at ease.  Patient has completed 30 day transition of care program, will continue to follow for complex care management program  Patient and daughter agreeable to follow up home visit in the next 2 weeks.  Plan Will plan home visit in the next 2 weeks, will continue to assess progress, additional resource and educational  needs.    [name], RN, [place] Management [phone]- Mobile [phone]- Toll Free Main Office

## 2016-06-27 ENCOUNTER — Telehealth: Payer: Self-pay

## 2016-06-27 DIAGNOSIS — I447 Left bundle-branch block, unspecified: Secondary | ICD-10-CM | POA: Diagnosis not present

## 2016-06-27 DIAGNOSIS — I5032 Chronic diastolic (congestive) heart failure: Secondary | ICD-10-CM | POA: Diagnosis not present

## 2016-06-27 DIAGNOSIS — I350 Nonrheumatic aortic (valve) stenosis: Secondary | ICD-10-CM | POA: Diagnosis not present

## 2016-06-27 DIAGNOSIS — I2102 ST elevation (STEMI) myocardial infarction involving left anterior descending coronary artery: Secondary | ICD-10-CM | POA: Diagnosis not present

## 2016-06-27 DIAGNOSIS — M8580 Other specified disorders of bone density and structure, unspecified site: Secondary | ICD-10-CM | POA: Diagnosis not present

## 2016-06-27 DIAGNOSIS — I110 Hypertensive heart disease with heart failure: Secondary | ICD-10-CM | POA: Diagnosis not present

## 2016-06-27 DIAGNOSIS — K5730 Diverticulosis of large intestine without perforation or abscess without bleeding: Secondary | ICD-10-CM | POA: Diagnosis not present

## 2016-06-27 DIAGNOSIS — I429 Cardiomyopathy, unspecified: Secondary | ICD-10-CM | POA: Diagnosis not present

## 2016-06-27 DIAGNOSIS — R7303 Prediabetes: Secondary | ICD-10-CM | POA: Diagnosis not present

## 2016-06-27 NOTE — Telephone Encounter (Signed)
Wanda Daven[facility] with [facility] called to let us know patients O2 stats.  While sitting patient was 92-93%, standing/walking 88% with 1 Liter of O2, rebounded with pursed lip breathing to 93%.

## 2016-06-29 DIAGNOSIS — M8580 Other specified disorders of bone density and structure, unspecified site: Secondary | ICD-10-CM | POA: Diagnosis not present

## 2016-06-29 DIAGNOSIS — I447 Left bundle-branch block, unspecified: Secondary | ICD-10-CM | POA: Diagnosis not present

## 2016-06-29 DIAGNOSIS — I429 Cardiomyopathy, unspecified: Secondary | ICD-10-CM | POA: Diagnosis not present

## 2016-06-29 DIAGNOSIS — K5730 Diverticulosis of large intestine without perforation or abscess without bleeding: Secondary | ICD-10-CM | POA: Diagnosis not present

## 2016-06-29 DIAGNOSIS — I5032 Chronic diastolic (congestive) heart failure: Secondary | ICD-10-CM | POA: Diagnosis not present

## 2016-06-29 DIAGNOSIS — R7303 Prediabetes: Secondary | ICD-10-CM | POA: Diagnosis not present

## 2016-06-29 DIAGNOSIS — I350 Nonrheumatic aortic (valve) stenosis: Secondary | ICD-10-CM | POA: Diagnosis not present

## 2016-06-29 DIAGNOSIS — I2102 ST elevation (STEMI) myocardial infarction involving left anterior descending coronary artery: Secondary | ICD-10-CM | POA: Diagnosis not present

## 2016-06-29 DIAGNOSIS — I110 Hypertensive heart disease with heart failure: Secondary | ICD-10-CM | POA: Diagnosis not present

## 2016-07-02 ENCOUNTER — Other Ambulatory Visit: Payer: Self-pay

## 2016-07-02 DIAGNOSIS — I200 Unstable angina: Secondary | ICD-10-CM

## 2016-07-02 DIAGNOSIS — I2699 Other pulmonary embolism without acute cor pulmonale: Secondary | ICD-10-CM

## 2016-07-02 DIAGNOSIS — I5022 Chronic systolic (congestive) heart failure: Secondary | ICD-10-CM

## 2016-07-02 DIAGNOSIS — I447 Left bundle-branch block, unspecified: Secondary | ICD-10-CM

## 2016-07-02 MED ORDER — BISOPROLOL FUMARATE 10 MG PO TABS: 10.0000 mg | ORAL_TABLET | Freq: Every day | ORAL | 0 refills | Status: DC

## 2016-07-02 NOTE — Telephone Encounter (Signed)
Pharmacy sent over refill request - sent to [facility]. MPulliam, CMA/RT(R)

## 2016-07-03 ENCOUNTER — Other Ambulatory Visit: Payer: Self-pay | Admitting: Family Medicine

## 2016-07-03 DIAGNOSIS — I5032 Chronic diastolic (congestive) heart failure: Secondary | ICD-10-CM | POA: Diagnosis not present

## 2016-07-03 DIAGNOSIS — I429 Cardiomyopathy, unspecified: Secondary | ICD-10-CM | POA: Diagnosis not present

## 2016-07-03 DIAGNOSIS — I110 Hypertensive heart disease with heart failure: Secondary | ICD-10-CM | POA: Diagnosis not present

## 2016-07-03 DIAGNOSIS — I2102 ST elevation (STEMI) myocardial infarction involving left anterior descending coronary artery: Secondary | ICD-10-CM | POA: Diagnosis not present

## 2016-07-03 DIAGNOSIS — R7303 Prediabetes: Secondary | ICD-10-CM | POA: Diagnosis not present

## 2016-07-03 DIAGNOSIS — I447 Left bundle-branch block, unspecified: Secondary | ICD-10-CM | POA: Diagnosis not present

## 2016-07-03 DIAGNOSIS — M8580 Other specified disorders of bone density and structure, unspecified site: Secondary | ICD-10-CM | POA: Diagnosis not present

## 2016-07-03 DIAGNOSIS — K5730 Diverticulosis of large intestine without perforation or abscess without bleeding: Secondary | ICD-10-CM | POA: Diagnosis not present

## 2016-07-03 DIAGNOSIS — I350 Nonrheumatic aortic (valve) stenosis: Secondary | ICD-10-CM | POA: Diagnosis not present

## 2016-07-05 DIAGNOSIS — I429 Cardiomyopathy, unspecified: Secondary | ICD-10-CM | POA: Diagnosis not present

## 2016-07-05 DIAGNOSIS — I110 Hypertensive heart disease with heart failure: Secondary | ICD-10-CM | POA: Diagnosis not present

## 2016-07-05 DIAGNOSIS — M8580 Other specified disorders of bone density and structure, unspecified site: Secondary | ICD-10-CM | POA: Diagnosis not present

## 2016-07-05 DIAGNOSIS — K5730 Diverticulosis of large intestine without perforation or abscess without bleeding: Secondary | ICD-10-CM | POA: Diagnosis not present

## 2016-07-05 DIAGNOSIS — I2102 ST elevation (STEMI) myocardial infarction involving left anterior descending coronary artery: Secondary | ICD-10-CM | POA: Diagnosis not present

## 2016-07-05 DIAGNOSIS — I350 Nonrheumatic aortic (valve) stenosis: Secondary | ICD-10-CM | POA: Diagnosis not present

## 2016-07-05 DIAGNOSIS — R7303 Prediabetes: Secondary | ICD-10-CM | POA: Diagnosis not present

## 2016-07-05 DIAGNOSIS — I447 Left bundle-branch block, unspecified: Secondary | ICD-10-CM | POA: Diagnosis not present

## 2016-07-05 DIAGNOSIS — I5032 Chronic diastolic (congestive) heart failure: Secondary | ICD-10-CM | POA: Diagnosis not present

## 2016-07-06 ENCOUNTER — Telehealth: Payer: Self-pay | Admitting: Family Medicine

## 2016-07-06 DIAGNOSIS — I2102 ST elevation (STEMI) myocardial infarction involving left anterior descending coronary artery: Secondary | ICD-10-CM | POA: Diagnosis not present

## 2016-07-06 DIAGNOSIS — I5032 Chronic diastolic (congestive) heart failure: Secondary | ICD-10-CM | POA: Diagnosis not present

## 2016-07-06 DIAGNOSIS — I429 Cardiomyopathy, unspecified: Secondary | ICD-10-CM | POA: Diagnosis not present

## 2016-07-06 DIAGNOSIS — M8580 Other specified disorders of bone density and structure, unspecified site: Secondary | ICD-10-CM | POA: Diagnosis not present

## 2016-07-06 DIAGNOSIS — I350 Nonrheumatic aortic (valve) stenosis: Secondary | ICD-10-CM | POA: Diagnosis not present

## 2016-07-06 DIAGNOSIS — K5730 Diverticulosis of large intestine without perforation or abscess without bleeding: Secondary | ICD-10-CM | POA: Diagnosis not present

## 2016-07-06 DIAGNOSIS — I110 Hypertensive heart disease with heart failure: Secondary | ICD-10-CM | POA: Diagnosis not present

## 2016-07-06 DIAGNOSIS — I447 Left bundle-branch block, unspecified: Secondary | ICD-10-CM | POA: Diagnosis not present

## 2016-07-06 DIAGNOSIS — R7303 Prediabetes: Secondary | ICD-10-CM | POA: Diagnosis not present

## 2016-07-06 NOTE — Telephone Encounter (Signed)
Wanda Davenport with [facility] is requesting verbal orders from Dr. Jenetta Downer. She would like to extend PT for [facility] for twice a week for another 4 weeks. Wanda Davenport states she has a full day so if you call back with those verbal orders she may be in a patient home, just leave the order on her VM.

## 2016-07-09 DIAGNOSIS — I110 Hypertensive heart disease with heart failure: Secondary | ICD-10-CM | POA: Diagnosis not present

## 2016-07-09 DIAGNOSIS — I5032 Chronic diastolic (congestive) heart failure: Secondary | ICD-10-CM | POA: Diagnosis not present

## 2016-07-09 DIAGNOSIS — I429 Cardiomyopathy, unspecified: Secondary | ICD-10-CM | POA: Diagnosis not present

## 2016-07-09 DIAGNOSIS — K5730 Diverticulosis of large intestine without perforation or abscess without bleeding: Secondary | ICD-10-CM | POA: Diagnosis not present

## 2016-07-09 DIAGNOSIS — R7303 Prediabetes: Secondary | ICD-10-CM | POA: Diagnosis not present

## 2016-07-09 DIAGNOSIS — I2102 ST elevation (STEMI) myocardial infarction involving left anterior descending coronary artery: Secondary | ICD-10-CM | POA: Diagnosis not present

## 2016-07-09 DIAGNOSIS — I447 Left bundle-branch block, unspecified: Secondary | ICD-10-CM | POA: Diagnosis not present

## 2016-07-09 DIAGNOSIS — I350 Nonrheumatic aortic (valve) stenosis: Secondary | ICD-10-CM | POA: Diagnosis not present

## 2016-07-09 DIAGNOSIS — M8580 Other specified disorders of bone density and structure, unspecified site: Secondary | ICD-10-CM | POA: Diagnosis not present

## 2016-07-09 NOTE — Telephone Encounter (Signed)
Left message giving verbal to continue patient care.

## 2016-07-11 ENCOUNTER — Telehealth: Payer: Self-pay | Admitting: Family Medicine

## 2016-07-11 NOTE — Telephone Encounter (Signed)
Called left message to call the office back. MPulliam, CMA/RT(R)  

## 2016-07-11 NOTE — Telephone Encounter (Signed)
Tillie Rung from [place] called torequest( Verbal Authorization) to Extend  PT (Twice a week for 4 weeks-- & Once weekly for 2 week for balance & gait---Pls call [phone]. --glh

## 2016-07-12 ENCOUNTER — Ambulatory Visit (INDEPENDENT_AMBULATORY_CARE_PROVIDER_SITE_OTHER): Payer: Medicare HMO | Admitting: Adult Health

## 2016-07-12 ENCOUNTER — Ambulatory Visit (HOSPITAL_COMMUNITY): Payer: Medicare HMO

## 2016-07-12 ENCOUNTER — Other Ambulatory Visit: Payer: Self-pay | Admitting: *Deleted

## 2016-07-12 ENCOUNTER — Encounter: Payer: Self-pay | Admitting: Adult Health

## 2016-07-12 ENCOUNTER — Ambulatory Visit: Payer: Medicare HMO

## 2016-07-12 VITALS — BP 112/58 | HR 77 | Ht 64.0 in | Wt 164.0 lb

## 2016-07-12 DIAGNOSIS — J0101 Acute recurrent maxillary sinusitis: Secondary | ICD-10-CM | POA: Diagnosis not present

## 2016-07-12 DIAGNOSIS — R05 Cough: Secondary | ICD-10-CM | POA: Diagnosis not present

## 2016-07-12 DIAGNOSIS — R0602 Shortness of breath: Secondary | ICD-10-CM | POA: Diagnosis not present

## 2016-07-12 DIAGNOSIS — J90 Pleural effusion, not elsewhere classified: Secondary | ICD-10-CM | POA: Diagnosis not present

## 2016-07-12 DIAGNOSIS — I5022 Chronic systolic (congestive) heart failure: Secondary | ICD-10-CM | POA: Diagnosis not present

## 2016-07-12 DIAGNOSIS — J0100 Acute maxillary sinusitis, unspecified: Secondary | ICD-10-CM | POA: Insufficient documentation

## 2016-07-12 DIAGNOSIS — R059 Cough, unspecified: Secondary | ICD-10-CM | POA: Insufficient documentation

## 2016-07-12 MED ORDER — FLUCONAZOLE 150 MG PO TABS: 150.0000 mg | ORAL_TABLET | Freq: Once | ORAL | 0 refills | Status: AC

## 2016-07-12 MED ORDER — AMOXICILLIN-POT CLAVULANATE 875-125 MG PO TABS: 1.0000 | ORAL_TABLET | Freq: Two times a day (BID) | ORAL | 0 refills | Status: DC

## 2016-07-12 NOTE — Assessment & Plan Note (Addendum)
CLINICAL DATA:  Shortness of breath, PE and DVT, on Eliquis  EXAM: CHEST  2 VIEW  COMPARISON:  CTA chest dated 05/15/2016  FINDINGS: Small to moderate left pleural effusion. Associated left lower lobe opacity, likely atelectasis.  Small right pleural effusion, best visualized on the lateral view.  Pulmonary vascular congestion. Very mild interstitial edema is suspected.  Cardiomegaly.  IMPRESSION: Suspected mild interstitial edema.  Small to moderate left pleural effusion. Associated left lower lobe opacity, likely atelectasis.  Small right pleural effusion. Arrived to clinic on 1/2 L portable O2 Has baseline SOB that has been worsening. 05/19/16 CXR IMPRESSION: Suspected mild interstitial edema. Small to moderate left pleural effusion. Associated left lower lobe opacity, likely atelectasis. Small right pleural effusion.  Today's CXR demonstrated  worsening L sided pleural effusion, there was persistent cardiomegaly and cephalization was worse than prior study. R pleural effsuion still present but stable.     Pt instructed to increase Furosemide and Cards appt scheduled for Monday 07/16/16.  Dr. Raliegh Scarlet read/verified in clinic CXR and consulted on course of treatment.

## 2016-07-12 NOTE — Progress Notes (Signed)
Subjective:    Patient ID: Wanda Davenport, female    DOB: 07-12-1923, 83 y.o.   MRN: [id number]  HPI:  Ms. Diguglielmo presents with continued non-productive cough and constant thick yellow/clear nasal drainage.  She was seen at [facility] clinic 06/15/16 with similar sx's and treated with course of Doxy.  She reports taking all the medication and initially felt better, then the nasal drainage worsened again.  She also worsening SOB and is now sleeping on 2 pillows as night (increase from 1 pillow).  She denies fever/night sweats/N/V/D/palptiations.   Daughter at [facility] during [place].  Patient Care Team: Mellody Dance, DO as PCP - General (Family Medicine) Christene Slates, MD (Dermatology) Leandrew Koyanagi, MD as Referring Physician (Ophthalmology) Deboraha Sprang, MD as Consulting Physician (Cardiology) Gatha Mayer, MD as Consulting Physician (Gastroenterology) Corey Skains, MD as Consulting Physician (Cardiology) Alfonzo Feller, RN as [facility], Aura Fey, FNP as Nurse Practitioner (Family Medicine)  Patient Active Problem List   Diagnosis Date Noted  . Sinusitis, acute maxillary 07/12/2016  . Cough 07/12/2016  . Shortness of breath 07/12/2016  . Pleural effusion, left 07/12/2016  . Chronic systolic heart failure ([place]) 06/15/2016  . PE (pulmonary thromboembolism) ([place]) 05/19/2016  . Unstable angina ([place])   . STEMI (ST elevation myocardial infarction) ([place]) 04/26/2016  . Knee pain, bilateral 04/10/2016  . Medication monitoring encounter 01/26/2016  . Intolerance of drug- statins 01/25/2016  . Counseling regarding end of life decision making 01/25/2016  . Hypertriglyceridemia 12/13/2015  . Low serum HDL 12/13/2015  . B12 deficiency 12/13/2015  . Claudication of left lower extremity ([place]) 12/13/2015  . Vitamin D deficiency 11/06/2015  . Adjustment disorder with mixed anxiety and depressed mood 11/06/2015  . Basal cell carcinoma 10/31/2015   . Environmental and seasonal allergies 10/31/2015  . Reactive airway disease- only spring and fall due to allergies 10/31/2015  . GAD (generalized anxiety disorder) 10/13/2015  . Atypical chest pain 04/16/2011  . Hyperlipidemia   . Pre-diabetes   . chronic LBBB (left bundle branch block)- since 2012   . Generalized OA   . Allergic rhinitis   . DIVERTICULOSIS, COLON 12/03/2007  . HTN (hypertension) 04/26/2007    Past Medical History:  Diagnosis Date  . Allergic rhinitis   . Anxiety   . Arthritis   . Atypical chest pain   . Bronchitis 01/14/2016  . Chronic diastolic CHF (congestive heart failure) (HCC)    Echo 06/2010 EF 60-65%, no RWMAs, grade 1 diastolic dysfunction, mild LAE, PASP 70mmHg.  Marland Kitchen Coronary artery disease    MI  . DIVERTICULOSIS, COLON   . HEMORRHOIDS, INTERNAL   . Hepatic steatosis   . Hyperlipidemia   . HYPERTENSION   . LBBB (left bundle branch block)    a. present on ECG 06/2010  . Osteopenia   . Pre-diabetes    A1c 6.0    Past Surgical History:  Procedure Laterality Date  . ABDOMINAL HYSTERECTOMY    . BREAST SURGERY    . CATARACT EXTRACTION W/PHACO Left 05/12/2014   Procedure: CATARACT EXTRACTION PHACO AND INTRAOCULAR LENS PLACEMENT (IOC);  Surgeon: Leandrew Koyanagi, MD;  Location: [place];  Service: Ophthalmology;  Laterality: Left;  . COLONOSCOPY     a. 2010  . CORONARY STENT INTERVENTION N/A 04/26/2016   Procedure: Coronary Stent Intervention;  Surgeon: Isaias Cowman, MD;  Location: [place] CV LAB;  Service: Cardiovascular;  Laterality: N/A;  . LEFT HEART CATH AND CORONARY  ANGIOGRAPHY N/A 04/26/2016   Procedure: Left Heart Cath and Coronary Angiography;  Surgeon: Isaias Cowman, MD;  Location: [place] CV LAB;  Service: Cardiovascular;  Laterality: N/A;  . LEFT HEART CATH AND CORONARY ANGIOGRAPHY N/A 05/05/2016   Procedure: Left Heart Cath and Coronary Angiography;  Surgeon: Wellington Hampshire, MD;  Location: [place] CV LAB;  Service: Cardiovascular;  Laterality: N/A;  . TONSILLECTOMY      Family History  Problem Relation Age of Onset  . Colon cancer Sister   . Melanoma Sister   . Hyperlipidemia Brother   . Hypertension Mother   . Lupus Mother        died 63 from surgical complications  . Alzheimer's disease Brother        unknown  . Kidney disease Father        died 3  . Hypertension Father   . Alcohol abuse Father     Social History  Substance Use Topics  . Smoking status: Former Smoker    Packs/day: 0.25    Years: 20.00    Types: Cigarettes    Quit date: 01/02/1971  . Smokeless tobacco: Never Used     Comment: smoked a few cigarettes/day x 20 yrs - quit @ age 38.  Marland Kitchen Alcohol use No   Counseling given: Not Answered   Outpatient Encounter Prescriptions as of 07/12/2016  Medication Sig  . albuterol (PROVENTIL HFA;VENTOLIN HFA) 108 (90 BASE) MCG/ACT inhaler Inhale 2 puffs into the lungs every 6 (six) hours as needed. Wheezing or shortness of breath  . apixaban (ELIQUIS) 5 MG TABS tablet Take 10 mg (2 tabs) twice a day till 05/22/16 and then from 05/23/16 take 1 tab (5 mg ) two times a day  . aspirin 325 MG EC tablet Take 325 mg by mouth daily.  . bisoprolol (ZEBETA) 10 MG tablet Take 1 tablet (10 mg total) by mouth daily.  Marland Kitchen escitalopram (LEXAPRO) 10 MG tablet TAKE 1 TABLET (10 MG TOTAL) BY MOUTH DAILY.  . fluticasone (FLONASE) 50 MCG/ACT nasal spray Place 2 sprays into the nose daily. Nasal congestion Rarely used   . furosemide (LASIX) 40 MG tablet Take 1 tablet (40 mg total) by mouth daily.  . isosorbide mononitrate (IMDUR) 30 MG 24 hr tablet Take 1 tablet (30 mg total) by mouth daily.  . montelukast (SINGULAIR) 10 MG tablet Take 10 mg by mouth at bedtime.  . nitroGLYCERIN (NITROSTAT) 0.3 MG SL tablet Place 0.3 mg under the tongue every 5 (five) minutes as needed for chest pain.  Marland Kitchen amoxicillin-clavulanate (AUGMENTIN) 875-125 MG tablet Take 1 tablet by mouth 2 (two) times daily.   . fluconazole (DIFLUCAN) 150 MG tablet Take 1 tablet (150 mg total) by mouth once.  . naphazoline-pheniramine (NAPHCON-A) 0.025-0.3 % ophthalmic solution Place 1 drop into both eyes 4 (four) times daily as needed for irritation.   No facility-administered encounter medications on file as of 07/12/2016.     Allergies  Allergen Reactions  . Fenofibrate Other (See Comments)    Myalgias/gi upset   . Statins Other (See Comments)    Myalgias   . Sulfonamide Derivatives Nausea And Vomiting    REACTION: nausea     Review of Systems  Constitutional: Positive for activity change and fatigue. Negative for appetite change, chills, diaphoresis, fever and unexpected weight change.  HENT: Positive for congestion, postnasal drip, rhinorrhea, sinus pain and sinus pressure. Negative for sore throat, trouble swallowing and voice change.   Eyes: Negative for visual disturbance.  Respiratory: Positive for cough and shortness of breath. Negative for chest tightness, wheezing and stridor.   Cardiovascular: Negative for chest pain, palpitations and leg swelling.  Gastrointestinal: Negative for abdominal distention, anal bleeding, blood in stool, constipation, diarrhea and vomiting.  Endocrine: Negative for cold intolerance, heat intolerance, polydipsia, polyphagia and polyuria.  Genitourinary: Negative for difficulty urinating, flank pain and hematuria.  Musculoskeletal: Positive for arthralgias, back pain, gait problem, joint swelling, myalgias, neck pain and neck stiffness.  Skin: Negative for color change, pallor, rash and wound.  Neurological: Positive for headaches. Negative for dizziness.  Psychiatric/Behavioral: Positive for sleep disturbance.       Objective:   Physical Exam  Constitutional: She is oriented to person, place, and time. She appears well-developed and well-nourished. No distress.  HENT:  Head: Normocephalic and atraumatic.  Right Ear: External ear normal. Tympanic membrane is  not erythematous and not bulging. Decreased hearing is noted.  Left Ear: External ear normal. Tympanic membrane is not erythematous and not bulging. Decreased hearing is noted.  Nose: Mucosal edema and rhinorrhea present. Right sinus exhibits maxillary sinus tenderness and frontal sinus tenderness. Left sinus exhibits maxillary sinus tenderness and frontal sinus tenderness.  Mouth/Throat: Uvula is midline, oropharynx is clear and moist and mucous membranes are normal.  Cardiovascular: Normal rate, regular rhythm, normal heart sounds and intact distal pulses.   No murmur heard. Pulmonary/Chest: Effort normal. No respiratory distress. She has decreased breath sounds in the right lower field, the left middle field and the left lower field. She has no wheezes. She has no rales. She exhibits no tenderness.  Neurological: She is alert and oriented to person, place, and time.  Skin: Skin is warm and dry. No rash noted. She is not diaphoretic. No erythema. No pallor.  Psychiatric: She has a normal mood and affect. Her behavior is normal. Judgment and thought content normal.  Nursing note and vitals reviewed.         Assessment & Plan:   1. Cough   2. Acute recurrent maxillary sinusitis   3. Chronic systolic heart failure ([place])   4. Shortness of breath   5. Pleural effusion, left     Cough Arrived to clinic on 1/2 L portable O2 Has baseline SOB that has been worsening. 05/19/16 CXR IMPRESSION: Suspected mild interstitial edema. Small to moderate left pleural effusion. Associated left lower lobe opacity, likely atelectasis. Small right pleural effusion.  Today's CXR demonstrated  worsening L sided pleural effusion, there was persistent cardiomegaly and cephalization was worse than prior study. R pleural effsuion still present but stable.   therefore instructed: Continue morning Lasix 40mg  daily and then take 1/2 tablet at 6pm nightly. Continue this until you are seen by [facility]  Cardiology on Monday. BMP 05/21/16: K+ 4.1, Creat 1.08 BMP obtained today. Dr. Raliegh Scarlet read/verified in clinic CXR and consulted on course of treatment.  Chronic systolic heart failure (HCC) Arrived to clinic on 1/2 L portable O2 Has baseline SOB that has been worsening. 05/19/16 CXR IMPRESSION: Suspected mild interstitial edema. Small to moderate left pleural effusion. Associated left lower lobe opacity, likely atelectasis. Small right pleural effusion.  Today's CXR demonstrated  worsening L sided pleural effusion, there was persistent cardiomegaly and cephalization was worse than prior study. R pleural effsuion still present but stable.   therefore instructed: Continue morning Lasix 40mg  daily and then take 1/2 tablet at 6pm nightly. Continue this until you are seen by [facility] Cardiology on Monday. BMP 05/21/16: K+ 4.1, Creat 1.08 BMP obtained  today. Dr. Raliegh Scarlet read/verified in clinic CXR and consulted on course of treatment.  Sinusitis, acute maxillary Augmentin twice a day for 10 days to treat sinus infection. Increase rest/vit c Diflucan 150mg  one time dose if yeast infection develops during antibiotic treatment  Pleural effusion, left CLINICAL DATA:  Shortness of breath, PE and DVT, on Eliquis  EXAM: CHEST  2 VIEW  COMPARISON:  CTA chest dated 05/15/2016  FINDINGS: Small to moderate left pleural effusion. Associated left lower lobe opacity, likely atelectasis.  Small right pleural effusion, best visualized on the lateral view.  Pulmonary vascular congestion. Very mild interstitial edema is suspected.  Cardiomegaly.  IMPRESSION: Suspected mild interstitial edema.  Small to moderate left pleural effusion. Associated left lower lobe opacity, likely atelectasis.  Small right pleural effusion. Arrived to clinic on 1/2 L portable O2 Has baseline SOB that has been worsening. 05/19/16 CXR IMPRESSION: Suspected mild interstitial edema. Small to  moderate left pleural effusion. Associated left lower lobe opacity, likely atelectasis. Small right pleural effusion.  Today's CXR demonstrated  worsening L sided pleural effusion, there was persistent cardiomegaly and cephalization was worse than prior study. R pleural effsuion still present but stable.     Pt instructed to increase Furosemide and Cards appt scheduled for Monday 07/16/16.  Dr. Raliegh Scarlet read/verified in clinic CXR and consulted on course of treatment.     Esaw Grandchild, NP

## 2016-07-12 NOTE — Patient Outreach (Signed)
[facility]) Care Management   07/12/2016  JENNIKA RINGGOLD April 28, 1923 [id number]  DISHA COTTAM is an 83 y.o. female  Subjective:  Complain of cold symptoms , feeling short of breath with minmal activity. Patients' daughter states she has called PCP office and has appointment on the morning. Patient discussed that home health is continuing to wean oxygen she is at .5 liters now and home saturation was 96% at rest.   Objective:  BP 118/68 (BP Location: Right Arm, Patient Position: Sitting, Cuff Size: Normal)   Pulse 76   Resp 20   SpO2 94%  Review of Systems  Constitutional: Negative.   HENT: Negative.        Complaint of sinus drainage  Eyes: Negative.   Respiratory: Positive for cough and shortness of breath.   Cardiovascular: Negative.   Gastrointestinal: Negative.   Genitourinary: Negative.   Skin: Negative.   Neurological: Negative.   Endo/Heme/Allergies: Negative.   Psychiatric/Behavioral: Negative.     Physical Exam  Respiratory: She has decreased breath sounds in the right lower field and the left lower field.    Encounter Medications:   Outpatient Encounter Prescriptions as of 07/12/2016  Medication Sig  . albuterol (PROVENTIL HFA;VENTOLIN HFA) 108 (90 BASE) MCG/ACT inhaler Inhale 2 puffs into the lungs every 6 (six) hours as needed. Wheezing or shortness of breath  . apixaban (ELIQUIS) 5 MG TABS tablet Take 10 mg (2 tabs) twice a day till 05/22/16 and then from 05/23/16 take 1 tab (5 mg ) two times a day  . aspirin 325 MG EC tablet Take 325 mg by mouth daily.  . bisoprolol (ZEBETA) 10 MG tablet Take 1 tablet (10 mg total) by mouth daily.  Marland Kitchen escitalopram (LEXAPRO) 10 MG tablet TAKE 1 TABLET (10 MG TOTAL) BY MOUTH DAILY.  . fluticasone (FLONASE) 50 MCG/ACT nasal spray Place 2 sprays into the nose daily. Nasal congestion Rarely used   . furosemide (LASIX) 40 MG tablet Take 1 tablet (40 mg total) by mouth daily.  . isosorbide mononitrate (IMDUR) 30 MG  24 hr tablet Take 1 tablet (30 mg total) by mouth daily.  . montelukast (SINGULAIR) 10 MG tablet Take 10 mg by mouth at bedtime.  . naphazoline-pheniramine (NAPHCON-A) 0.025-0.3 % ophthalmic solution Place 1 drop into both eyes 4 (four) times daily as needed for irritation.  . nitroGLYCERIN (NITROSTAT) 0.3 MG SL tablet Place 0.3 mg under the tongue every 5 (five) minutes as needed for chest pain.   No facility-administered encounter medications on file as of 07/12/2016.     Functional Status:   In your present state of health, do you have any difficulty performing the following activities: 06/14/2016 05/31/2016  Hearing? Tempie Donning  Vision? N N  Difficulty concentrating or making decisions? N N  Walking or climbing stairs? Y Y  Dressing or bathing? Y N  Doing errands, shopping? Tempie Donning  Preparing Food and eating ? - Y  Using the Toilet? - N  In the past six months, have you accidently leaked urine? - N  Do you have problems with loss of bowel control? - N  Managing your Medications? - Y  Managing your Finances? - Y  Housekeeping or managing your Housekeeping? - Y  Some recent data might be hidden    Fall/Depression Screening:    Fall Risk  06/14/2016 05/31/2016 05/24/2016  Falls in the past year? No No No  Risk for fall due to : - Impaired balance/gait Impaired balance/gait  PHQ 2/9 Scores 06/14/2016 05/31/2016 05/22/2016 12/13/2015 10/31/2015  PHQ - 2 Score 0 0 0 3 3  PHQ- 9 Score - - 2 12 11     Assessment:  Routine home visit patient daughter present.   Cold/Sinus  symptoms - patient has PCP visit this am  Heart Failure Continuing daily weights. No sudden increases in weight or swelling. Oxygen weaning initiated and followed by home health. Adhering to low salt diet Patient followed by home health RN and physical therapy , and using telemontoring system daily consistently along with writing down information.    Patient's daughter discussed concern regarding increase stress between she and  patient with her staying there and it will be best for both if she,and not stay 24 hours but be available as needed and continue to provide transportation and assistance as allowed.  Discussed with patient , states she will be alright,highly suggested medical alert system and reinforced previous information on [place] alert system to increase safety at home. Discussed with patient and her daughter the Care Connection program for support and  symptom management of chronic medical problems such as COPD. Patient states she will think about it and let me know at our next visit.   Plan:  Patient to attend PCP visit today. Will follow up with patient,daughter request to be called as patient has difficulty hearing  by telephone in the next 2 weeks  [facility] CM Care Plan Problem One     Most Recent Value  Care Plan Problem One  Recent hospital admission related to pulmonary embolism   Role Documenting the Problem One  Care Management [place] for Problem One  Active  [facility] Long Term Goal   Patient will not experience a hospital admission in the next 60 days   [place] Term Goal Start Date  05/24/16 Barrie Folk date restarted due to readmission]  Interventions for Problem One Long Term Goal  Reinforced taking medications as prescribed and notifying MD sooner to arrange office visit  before symptoms in red zone.   [facility] CM Short Term Goal #1   Patient will attend all medical appointments in the next 30 days   [facility] CM Short Term Goal #1 Start Date  05/18/16  [facility] CM Short Term Goal #1 Met Date  06/19/16  [facility] CM Short Term Goal #2   Patient will begin to weigh daily and keep a record in the next 30 days   [facility] CM Short Term Goal #2 Start Date  05/24/16  [facility] CM Short Term Goal #2 Met Date  06/26/16  [facility] CM Short Term Goal #3  Patient will report increased knowledge of symptoms of heart failure and action plan to follow  in the next 30 days   [facility] CM Short Term Goal #3 Start Date  06/26/16 Billy Fischer goal date  , ]  Interventions for Short Tern Goal #3  Discussed worsening symptoms to notify MD , shortness of breath  weight gain of 2 pounds in a day or 5 in a week.         [name], RN, [place] Management [phone]- Mobile [phone]- Toll Free Main Office

## 2016-07-12 NOTE — Assessment & Plan Note (Addendum)
Arrived to clinic on 1/2 L portable O2 Has baseline SOB that has been worsening. 05/19/16 CXR IMPRESSION: Suspected mild interstitial edema. Small to moderate left pleural effusion. Associated left lower lobe opacity, likely atelectasis. Small right pleural effusion.  Today's CXR demonstrated  worsening L sided pleural effusion, there was persistent cardiomegaly and cephalization was worse than prior study. R pleural effsuion still present but stable.   therefore instructed: Continue morning Lasix 40mg  daily and then take 1/2 tablet at 6pm nightly. Continue this until you are seen by [facility] on Monday. BMP 05/21/16: K+ 4.1, Creat 1.08 BMP obtained today. Dr. Raliegh Scarlet read/verified in clinic CXR and consulted on course of treatment.

## 2016-07-12 NOTE — Patient Instructions (Addendum)
Sinusitis, Adult Sinusitis is soreness and inflammation of your sinuses. Sinuses are hollow spaces in the bones around your face. Your sinuses are located:  Around your eyes.  In the middle of your forehead.  Behind your nose.  In your cheekbones.  Your sinuses and nasal passages are lined with a stringy fluid (mucus). Mucus normally drains out of your sinuses. When your nasal tissues become inflamed or swollen, the mucus can become trapped or blocked so air cannot flow through your sinuses. This allows bacteria, viruses, and funguses to grow, which leads to infection. Sinusitis can develop quickly and last for 7?10 days (acute) or for more than 12 weeks (chronic). Sinusitis often develops after a cold. What are the causes? This condition is caused by anything that creates swelling in the sinuses or stops mucus from draining, including:  Allergies.  Asthma.  Bacterial or viral infection.  Abnormally shaped bones between the nasal passages.  Nasal growths that contain mucus (nasal polyps).  Narrow sinus openings.  Pollutants, such as chemicals or irritants in the air.  A foreign object stuck in the nose.  A fungal infection. This is rare.  What increases the risk? The following factors may make you more likely to develop this condition:  Having allergies or asthma.  Having had a recent cold or respiratory tract infection.  Having structural deformities or blockages in your nose or sinuses.  Having a weak immune system.  Doing a lot of swimming or diving.  Overusing nasal sprays.  Smoking.  What are the signs or symptoms? The main symptoms of this condition are pain and a feeling of pressure around the affected sinuses. Other symptoms include:  Upper toothache.  Earache.  Headache.  Bad breath.  Decreased sense of smell and taste.  A cough that may get worse at night.  Fatigue.  Fever.  Thick drainage from your nose. The drainage is often green and  it may contain pus (purulent).  Stuffy nose or congestion.  Postnasal drip. This is when extra mucus collects in the throat or back of the nose.  Swelling and warmth over the affected sinuses.  Sore throat.  Sensitivity to light.  How is this diagnosed? This condition is diagnosed based on symptoms, a medical history, and a physical exam. To find out if your condition is acute or chronic, your health care provider may:  Look in your nose for signs of nasal polyps.  Tap over the affected sinus to check for signs of infection.  View the inside of your sinuses using an imaging device that has a light attached (endoscope).  If your health care provider suspects that you have chronic sinusitis, you may also:  Be tested for allergies.  Have a sample of mucus taken from your nose (nasal culture) and checked for bacteria.  Have a mucus sample examined to see if your sinusitis is related to an allergy.  If your sinusitis does not respond to treatment and it lasts longer than 8 weeks, you may have an MRI or CT scan to check your sinuses. These scans also help to determine how severe your infection is. In rare cases, a bone biopsy may be done to rule out more serious types of fungal sinus disease. How is this treated? Treatment for sinusitis depends on the cause and whether your condition is chronic or acute. If a virus is causing your sinusitis, your symptoms will go away on their own within 10 days. You may be given medicines to relieve your symptoms,   including:  Topical nasal decongestants. They shrink swollen nasal passages and let mucus drain from your sinuses.  Antihistamines. These drugs block inflammation that is triggered by allergies. This can help to ease swelling in your nose and sinuses.  Topical nasal corticosteroids. These are nasal sprays that ease inflammation and swelling in your nose and sinuses.  Nasal saline washes. These rinses can help to get rid of thick mucus in  your nose.  If your condition is caused by bacteria, you will be given an antibiotic medicine. If your condition is caused by a fungus, you will be given an antifungal medicine. Surgery may be needed to correct underlying conditions, such as narrow nasal passages. Surgery may also be needed to remove polyps. Follow these instructions at home: Medicines  Take, use, or apply over-the-counter and prescription medicines only as told by your health care provider. These may include nasal sprays.  If you were prescribed an antibiotic medicine, take it as told by your health care provider. Do not stop taking the antibiotic even if you start to feel better. Hydrate and Humidify  Drink enough water to keep your urine clear or pale yellow. Staying hydrated will help to thin your mucus.  Use a cool mist humidifier to keep the humidity level in your home above 50%.  Inhale steam for 10-15 minutes, 3-4 times a day or as told by your health care provider. You can do this in the bathroom while a hot shower is running.  Limit your exposure to cool or dry air. Rest  Rest as much as possible.  Sleep with your head raised (elevated).  Make sure to get enough sleep each night. General instructions  Apply a warm, moist washcloth to your face 3-4 times a day or as told by your health care provider. This will help with discomfort.  Wash your hands often with soap and water to reduce your exposure to viruses and other germs. If soap and water are not available, use hand sanitizer.  Do not smoke. Avoid being around people who are smoking (secondhand smoke).  Keep all follow-up visits as told by your health care provider. This is important. Contact a health care provider if:  You have a fever.  Your symptoms get worse.  Your symptoms do not improve within 10 days. Get help right away if:  You have a severe headache.  You have persistent vomiting.  You have pain or swelling around your face or  eyes.  You have vision problems.  You develop confusion.  Your neck is stiff.  You have trouble breathing. This information is not intended to replace advice given to you by your health care provider. Make sure you discuss any questions you have with your health care provider. Document Released: 12/18/2004 Document Revised: 08/14/2015 Document Reviewed: 10/13/2014 Elsevier Interactive Patient Education  2017 [place].   Heart Failure Heart failure is a condition in which the heart has trouble pumping blood because it has become weak or stiff. This means that the heart does not pump blood efficiently for the body to work well. For some people with heart failure, fluid may back up into the lungs and there may be swelling (edema) in the lower legs. Heart failure is usually a long-term (chronic) condition. It is important for you to take good care of yourself and follow the treatment plan from your health care provider. What are the causes? This condition is caused by some health problems, including:  High blood pressure (hypertension). Hypertension causes  the heart muscle to work harder than normal. High blood pressure eventually causes the heart to become stiff and weak.  Coronary artery disease (CAD). CAD is the buildup of cholesterol and fat (plaques) in the arteries of the heart.  Heart attack (myocardial infarction). Injured tissue, which is caused by the heart attack, does not contract as well and the heart's ability to pump blood is weakened.  Abnormal heart valves. When the heart valves do not open and close properly, the heart muscle must pump harder to keep the blood flowing.  Heart muscle disease (cardiomyopathy or myocarditis). Heart muscle disease is damage to the heart muscle from a variety of causes, such as drug or alcohol abuse, infections, or unknown causes. These can increase the risk of heart failure.  Lung disease. When the lungs do not work properly, the heart  must work harder.  What increases the risk? Risk of heart failure increases as a person ages. This condition is also more likely to develop in people who:  Are overweight.  Are female.  Smoke or chew tobacco.  Abuse alcohol or illegal drugs.  Have taken medicines that can damage the heart, such as chemotherapy drugs.  Have diabetes. ? High blood sugar (glucose) is associated with high fat (lipid) levels in the blood. ? Diabetes can also damage tiny blood vessels that carry nutrients to the heart muscle.  Have abnormal heart rhythms.  Have thyroid problems.  Have low blood counts (anemia).  What are the signs or symptoms? Symptoms of this condition include:  Shortness of breath with activity, such as when climbing stairs.  Persistent cough.  Swelling of the feet, ankles, legs, or abdomen.  Unexplained weight gain.  Difficulty breathing when lying flat (orthopnea).  Waking from sleep because of the need to sit up and get more air.  Rapid heartbeat.  Fatigue and loss of energy.  Feeling light-headed, dizzy, or close to fainting.  Loss of appetite.  Nausea.  Increased urination during the night (nocturia).  Confusion.  How is this diagnosed? This condition is diagnosed based on:  Medical history, symptoms, and a physical exam.  Diagnostic tests, which may include: ? Echocardiogram. ? Electrocardiogram (ECG). ? Chest X-ray. ? Blood tests. ? Exercise stress test. ? Radionuclide scans. ? Cardiac catheterization and angiogram.  How is this treated? Treatment for this condition is aimed at managing the symptoms of heart failure. Medicines, behavioral changes, or other treatments may be necessary to treat heart failure. Medicines These may include:  Angiotensin-converting enzyme (ACE) inhibitors. This type of medicine blocks the effects of a blood protein called angiotensin-converting enzyme. ACE inhibitors relax (dilate) the blood vessels and help to  lower blood pressure.  Angiotensin receptor blockers (ARBs). This type of medicine blocks the actions of a blood protein called angiotensin. ARBs dilate the blood vessels and help to lower blood pressure.  Water pills (diuretics). Diuretics cause the kidneys to remove salt and water from the blood. The extra fluid is removed through urination, leaving a lower volume of blood that the heart has to pump.  Beta blockers. These improve heart muscle strength and they prevent the heart from beating too quickly.  Digoxin. This increases the force of the heartbeat.  Healthy behavior changes These may include:  Reaching and maintaining a healthy weight.  Stopping smoking or chewing tobacco.  Eating heart-healthy foods.  Limiting or avoiding alcohol.  Stopping use of street drugs (illegal drugs).  Physical activity.  Other treatments These may include:  Surgery to open  blocked coronary arteries or repair damaged heart valves.  Placement of a biventricular pacemaker to improve heart muscle function (cardiac resynchronization therapy). This device paces both the right ventricle and left ventricle.  Placement of a device to treat serious abnormal heart rhythms (implantable cardioverter defibrillator, or ICD).  Placement of a device to improve the pumping ability of the heart (left ventricular assist device, or LVAD).  Heart transplant. This can cure heart failure, and it is considered for certain patients who do not improve with other therapies.  Follow these instructions at home: Medicines  Take over-the-counter and prescription medicines only as told by your health care provider. Medicines are important in reducing the workload of your heart, slowing the progression of heart failure, and improving your symptoms. ? Do not stop taking your medicine unless your health care provider told you to do that. ? Do not skip any dose of medicine. ? Refill your prescriptions before you run out of  medicine. You need your medicines every day. Eating and drinking   Eat heart-healthy foods. Talk with a dietitian to make an eating plan that is right for you. ? Choose foods that contain no trans fat and are low in saturated fat and cholesterol. Healthy choices include fresh or frozen fruits and vegetables, fish, lean meats, legumes, fat-free or low-fat dairy products, and whole-grain or high-fiber foods. ? Limit salt (sodium) if directed by your health care provider. Sodium restriction may reduce symptoms of heart failure. Ask a dietitian to recommend heart-healthy seasonings. ? Use healthy cooking methods instead of frying. Healthy methods include roasting, grilling, broiling, baking, poaching, steaming, and stir-frying.  Limit your fluid intake if directed by your health care provider. Fluid restriction may reduce symptoms of heart failure. Lifestyle  Stop smoking or using chewing tobacco. Nicotine and tobacco can damage your heart and your blood vessels. Do not use nicotine gum or patches before talking to your health care provider.  Limit alcohol intake to no more than 1 drink per day for non-pregnant women and 2 drinks per day for men. One drink equals 12 oz of beer, 5 oz of wine, or 1 oz of hard liquor. ? Drinking more than that is harmful to your heart. Tell your health care provider if you drink alcohol several times a week. ? Talk with your health care provider about whether any level of alcohol use is safe for you. ? If your heart has already been damaged by alcohol or you have severe heart failure, drinking alcohol should be stopped completely.  Stop use of illegal drugs.  Lose weight if directed by your health care provider. Weight loss may reduce symptoms of heart failure.  Do moderate physical activity if directed by your health care provider. People who are elderly and people with severe heart failure should consult with a health care provider for physical activity  recommendations. Monitor important information  Weigh yourself every day. Keeping track of your weight daily helps you to notice excess fluid sooner. ? Weigh yourself every morning after you urinate and before you eat breakfast. ? Wear the same amount of clothing each time you weigh yourself. ? Record your daily weight. Provide your health care provider with your weight record.  Monitor and record your blood pressure as told by your health care provider.  Check your pulse as told by your health care provider. Dealing with extreme temperatures  If the weather is extremely hot: ? Avoid vigorous physical activity. ? Use air conditioning or fans or seek  a cooler location. ? Avoid caffeine and alcohol. ? Wear loose-fitting, lightweight, and light-colored clothing.  If the weather is extremely cold: ? Avoid vigorous physical activity. ? Layer your clothes. ? Wear mittens or gloves, a hat, and a scarf when you go outside. ? Avoid alcohol. General instructions  Manage other health conditions such as hypertension, diabetes, thyroid disease, or abnormal heart rhythms as told by your health care provider.  Learn to manage stress. If you need help to do this, ask your health care provider.  Plan rest periods when fatigued.  Get ongoing education and support as needed.  Participate in or seek rehabilitation as needed to maintain or improve independence and quality of life.  Stay up to date with immunizations. Keeping current on pneumococcal and influenza immunizations is especially important to prevent respiratory infections.  Keep all follow-up visits as told by your health care provider. This is important. Contact a health care provider if:  You have a rapid weight gain.  You have increasing shortness of breath that is unusual for you.  You are unable to participate in your usual physical activities.  You tire easily.  You cough more than normal, especially with physical  activity.  You have any swelling or more swelling in areas such as your hands, feet, ankles, or abdomen.  You are unable to sleep because it is hard to breathe.  You feel like your heart is beating quickly (palpitations).  You become dizzy or light-headed when you stand up. Get help right away if:  You have difficulty breathing.  You notice or your family notices a change in your awareness, such as having trouble staying awake or having difficulty with concentration.  You have pain or discomfort in your chest.  You have an episode of fainting (syncope). This information is not intended to replace advice given to you by your health care provider. Make sure you discuss any questions you have with your health care provider. Document Released: 12/18/2004 Document Revised: 08/23/2015 Document Reviewed: 07/13/2015 Elsevier Interactive Patient Education  2017 [place] revealed worsening L sided pleural effusion- Please continue morning Lasix 40mg  daily and then take 1/2 tablet at 6pm nightly. Continue this until you are seen by [facility] on Monday. Augmentin twice a day for 10 days to treat sinus infection. Increase rest/vit c Diflucan 150mg  one time dose if yeast infection develops during antibiotic treatment.  You are scheduled at [facility] 07/16/2016 at 1:45.  Please arrive 15 minutes prior. [address]  [facility]  [place], [place] [address]  [phone]  [phone] (Fax)

## 2016-07-12 NOTE — Assessment & Plan Note (Signed)
Augmentin twice a day for 10 days to treat sinus infection. Increase rest/vit c Diflucan 150mg  one time dose if yeast infection develops during antibiotic treatment

## 2016-07-12 NOTE — Assessment & Plan Note (Addendum)
Arrived to clinic on 1/2 L portable O2 Has baseline SOB that has been worsening. 05/19/16 CXR IMPRESSION: Suspected mild interstitial edema. Small to moderate left pleural effusion. Associated left lower lobe opacity, likely atelectasis. Small right pleural effusion.  Today's CXR demonstrated  worsening L sided pleural effusion, there was persistent cardiomegaly and cephalization was worse than prior study. R pleural effsuion still present but stable.   therefore instructed: Continue morning Lasix 40mg  daily and then take 1/2 tablet at 6pm nightly. Continue this until you are seen by [facility] on Monday. BMP 05/21/16: K+ 4.1, Creat 1.08 BMP obtained today. Dr. Raliegh Scarlet read/verified in clinic CXR and consulted on course of treatment.

## 2016-07-13 DIAGNOSIS — M150 Primary generalized (osteo)arthritis: Secondary | ICD-10-CM | POA: Diagnosis not present

## 2016-07-13 DIAGNOSIS — I252 Old myocardial infarction: Secondary | ICD-10-CM | POA: Diagnosis not present

## 2016-07-13 DIAGNOSIS — I110 Hypertensive heart disease with heart failure: Secondary | ICD-10-CM | POA: Diagnosis not present

## 2016-07-13 DIAGNOSIS — I429 Cardiomyopathy, unspecified: Secondary | ICD-10-CM | POA: Diagnosis not present

## 2016-07-13 DIAGNOSIS — J45909 Unspecified asthma, uncomplicated: Secondary | ICD-10-CM | POA: Diagnosis not present

## 2016-07-13 DIAGNOSIS — F4323 Adjustment disorder with mixed anxiety and depressed mood: Secondary | ICD-10-CM | POA: Diagnosis not present

## 2016-07-13 DIAGNOSIS — I350 Nonrheumatic aortic (valve) stenosis: Secondary | ICD-10-CM | POA: Diagnosis not present

## 2016-07-13 DIAGNOSIS — I447 Left bundle-branch block, unspecified: Secondary | ICD-10-CM | POA: Diagnosis not present

## 2016-07-13 DIAGNOSIS — I5042 Chronic combined systolic (congestive) and diastolic (congestive) heart failure: Secondary | ICD-10-CM | POA: Diagnosis not present

## 2016-07-13 LAB — COMPREHENSIVE METABOLIC PANEL
A/G RATIO: 1.9 (ref 1.2–2.2)
ALK PHOS: 91 IU/L (ref 39–117)
ALT: 22 IU/L (ref 0–32)
AST: 24 IU/L (ref 0–40)
Albumin: 4.1 g/dL (ref 3.2–4.6)
BUN/Creatinine Ratio: 21 (ref 12–28)
BUN: 25 mg/dL (ref 10–36)
Bilirubin Total: 0.4 mg/dL (ref 0.0–1.2)
CALCIUM: 10.2 mg/dL (ref 8.7–10.3)
CO2: 22 mmol/L (ref 20–29)
CREATININE: 1.18 mg/dL — AB (ref 0.57–1.00)
Chloride: 103 mmol/L (ref 96–106)
GFR calc Af Amer: 47 mL/min/{1.73_m2} — ABNORMAL LOW (ref >59)
GFR, EST NON AFRICAN AMERICAN: 40 mL/min/{1.73_m2} — AB (ref >59)
Globulin, Total: 2.2 g/dL (ref 1.5–4.5)
Glucose: 98 mg/dL (ref 65–99)
POTASSIUM: 4.8 mmol/L (ref 3.5–5.2)
Sodium: 142 mmol/L (ref 134–144)
Total Protein: 6.3 g/dL (ref 6.0–8.5)

## 2016-07-16 DIAGNOSIS — I110 Hypertensive heart disease with heart failure: Secondary | ICD-10-CM | POA: Diagnosis not present

## 2016-07-16 DIAGNOSIS — I350 Nonrheumatic aortic (valve) stenosis: Secondary | ICD-10-CM | POA: Diagnosis not present

## 2016-07-16 DIAGNOSIS — I447 Left bundle-branch block, unspecified: Secondary | ICD-10-CM | POA: Diagnosis not present

## 2016-07-16 DIAGNOSIS — I252 Old myocardial infarction: Secondary | ICD-10-CM | POA: Diagnosis not present

## 2016-07-16 DIAGNOSIS — J45909 Unspecified asthma, uncomplicated: Secondary | ICD-10-CM | POA: Diagnosis not present

## 2016-07-16 DIAGNOSIS — M150 Primary generalized (osteo)arthritis: Secondary | ICD-10-CM | POA: Diagnosis not present

## 2016-07-16 DIAGNOSIS — I5042 Chronic combined systolic (congestive) and diastolic (congestive) heart failure: Secondary | ICD-10-CM | POA: Diagnosis not present

## 2016-07-16 DIAGNOSIS — F4323 Adjustment disorder with mixed anxiety and depressed mood: Secondary | ICD-10-CM | POA: Diagnosis not present

## 2016-07-16 DIAGNOSIS — I429 Cardiomyopathy, unspecified: Secondary | ICD-10-CM | POA: Diagnosis not present

## 2016-07-18 ENCOUNTER — Encounter: Payer: Self-pay | Admitting: Family

## 2016-07-18 ENCOUNTER — Ambulatory Visit: Payer: Medicare HMO | Attending: Family | Admitting: Family

## 2016-07-18 VITALS — BP 103/55 | HR 73 | Resp 18 | Ht 61.0 in | Wt 161.5 lb

## 2016-07-18 DIAGNOSIS — Z808 Family history of malignant neoplasm of other organs or systems: Secondary | ICD-10-CM | POA: Insufficient documentation

## 2016-07-18 DIAGNOSIS — F4323 Adjustment disorder with mixed anxiety and depressed mood: Secondary | ICD-10-CM | POA: Diagnosis not present

## 2016-07-18 DIAGNOSIS — I2510 Atherosclerotic heart disease of native coronary artery without angina pectoris: Secondary | ICD-10-CM | POA: Insufficient documentation

## 2016-07-18 DIAGNOSIS — E785 Hyperlipidemia, unspecified: Secondary | ICD-10-CM | POA: Diagnosis not present

## 2016-07-18 DIAGNOSIS — Z87891 Personal history of nicotine dependence: Secondary | ICD-10-CM | POA: Insufficient documentation

## 2016-07-18 DIAGNOSIS — Z820 Family history of epilepsy and other diseases of the nervous system: Secondary | ICD-10-CM | POA: Diagnosis not present

## 2016-07-18 DIAGNOSIS — Z9981 Dependence on supplemental oxygen: Secondary | ICD-10-CM | POA: Insufficient documentation

## 2016-07-18 DIAGNOSIS — I350 Nonrheumatic aortic (valve) stenosis: Secondary | ICD-10-CM | POA: Diagnosis not present

## 2016-07-18 DIAGNOSIS — R7303 Prediabetes: Secondary | ICD-10-CM | POA: Diagnosis not present

## 2016-07-18 DIAGNOSIS — I5022 Chronic systolic (congestive) heart failure: Secondary | ICD-10-CM

## 2016-07-18 DIAGNOSIS — M8580 Other specified disorders of bone density and structure, unspecified site: Secondary | ICD-10-CM | POA: Insufficient documentation

## 2016-07-18 DIAGNOSIS — Z9842 Cataract extraction status, left eye: Secondary | ICD-10-CM | POA: Diagnosis not present

## 2016-07-18 DIAGNOSIS — Z90710 Acquired absence of both cervix and uterus: Secondary | ICD-10-CM | POA: Insufficient documentation

## 2016-07-18 DIAGNOSIS — I5042 Chronic combined systolic (congestive) and diastolic (congestive) heart failure: Secondary | ICD-10-CM | POA: Diagnosis not present

## 2016-07-18 DIAGNOSIS — Z9889 Other specified postprocedural states: Secondary | ICD-10-CM | POA: Insufficient documentation

## 2016-07-18 DIAGNOSIS — I252 Old myocardial infarction: Secondary | ICD-10-CM | POA: Diagnosis not present

## 2016-07-18 DIAGNOSIS — I110 Hypertensive heart disease with heart failure: Secondary | ICD-10-CM | POA: Diagnosis not present

## 2016-07-18 DIAGNOSIS — Z800 Family history of malignant neoplasm of digestive organs: Secondary | ICD-10-CM | POA: Diagnosis not present

## 2016-07-18 DIAGNOSIS — J45909 Unspecified asthma, uncomplicated: Secondary | ICD-10-CM | POA: Diagnosis not present

## 2016-07-18 DIAGNOSIS — I447 Left bundle-branch block, unspecified: Secondary | ICD-10-CM | POA: Insufficient documentation

## 2016-07-18 DIAGNOSIS — I10 Essential (primary) hypertension: Secondary | ICD-10-CM

## 2016-07-18 DIAGNOSIS — Z7982 Long term (current) use of aspirin: Secondary | ICD-10-CM | POA: Diagnosis not present

## 2016-07-18 DIAGNOSIS — Z811 Family history of alcohol abuse and dependence: Secondary | ICD-10-CM | POA: Insufficient documentation

## 2016-07-18 DIAGNOSIS — M150 Primary generalized (osteo)arthritis: Secondary | ICD-10-CM | POA: Diagnosis not present

## 2016-07-18 DIAGNOSIS — Z955 Presence of coronary angioplasty implant and graft: Secondary | ICD-10-CM | POA: Insufficient documentation

## 2016-07-18 DIAGNOSIS — I429 Cardiomyopathy, unspecified: Secondary | ICD-10-CM | POA: Diagnosis not present

## 2016-07-18 DIAGNOSIS — Z841 Family history of disorders of kidney and ureter: Secondary | ICD-10-CM | POA: Insufficient documentation

## 2016-07-18 DIAGNOSIS — Z882 Allergy status to sulfonamides status: Secondary | ICD-10-CM | POA: Diagnosis not present

## 2016-07-18 DIAGNOSIS — F419 Anxiety disorder, unspecified: Secondary | ICD-10-CM | POA: Diagnosis not present

## 2016-07-18 DIAGNOSIS — Z7902 Long term (current) use of antithrombotics/antiplatelets: Secondary | ICD-10-CM | POA: Diagnosis not present

## 2016-07-18 DIAGNOSIS — Z888 Allergy status to other drugs, medicaments and biological substances status: Secondary | ICD-10-CM | POA: Diagnosis not present

## 2016-07-18 NOTE — Patient Instructions (Signed)
Continue weighing daily and call for an overnight weight gain of > 2 pounds or a weekly weight gain of >5 pounds. 

## 2016-07-18 NOTE — Progress Notes (Signed)
Patient ID: Wanda Davenport, female    DOB: [birth year], 83 y.o.   MRN: [id number]  HPI  Ms Maulding is a 83 y/o female with a history of atypical chest pain, STEMI with stent, anxiety, diverticulosis, hepatic steatosis, hyperlipidemia, HTN, LBBB, osteopenia, remote tobacco use, chronic oxygen use and chronic heart failure  Reviewed last echo report from 04/26/16 which showed an EF of 35-40% along with mild MR/TR. EF has decreased from 2013 when it was 45-50%. Cardiac catheterization done 04/26/16 due to STEMI showing 100% stenosis of ost LAD to prox LAD. Drug eluting stent place with 0% residual stenosis post intervention. Another catheterization was done 05/05/16 and showed widely patent LAD stent without stent thrombosis.  Admitted 05/19/16 due to HF exacerbation. Treated with IV lasix. Discharged with oxygen along with home health after 2 days. Admitted 05/15/16 due to multiple emboli on the right and right lower DVT. Cardiology consult obtained. Discharged home after 2 days. Admitted 05/05/16 due to [place] and HF exacerbation. Catheterization done which showed a patent LAD stent. Diuresed with lasix. Discharged home with antibiotics due to PNA. Discharged the following day. Admitted 04/26/16 due to STEMI and new HF. Cardiology consult obtained and catheterization done with a LAD stent placed. Discharged after 4 days.   She presents today for her follow-up visit with a chief complaint of mild shortness of breath with moderate exertion. She describes this as chronic in nature and has been present for several years with various levels of severity. She has associated neck pain along with this. Denies any fatigue, pedal edema or weight gain.   Past Medical History:  Diagnosis Date  . Allergic rhinitis   . Anxiety   . Arthritis   . Atypical chest pain   . Bronchitis 01/14/2016  . Chronic diastolic CHF (congestive heart failure) (HCC)    Echo 06/2010 EF 60-65%, no RWMAs, grade 1 diastolic dysfunction, mild LAE,  PASP 85mmHg.  Marland Kitchen Coronary artery disease    MI  . DIVERTICULOSIS, COLON   . HEMORRHOIDS, INTERNAL   . Hepatic steatosis   . Hyperlipidemia   . HYPERTENSION   . LBBB (left bundle branch block)    a. present on ECG 06/2010  . Osteopenia   . Pre-diabetes    A1c 6.0   Past Surgical History:  Procedure Laterality Date  . ABDOMINAL HYSTERECTOMY    . BREAST SURGERY    . CATARACT EXTRACTION W/PHACO Left 05/12/2014   Procedure: CATARACT EXTRACTION PHACO AND INTRAOCULAR LENS PLACEMENT (IOC);  Surgeon: Leandrew Koyanagi, MD;  Location: [employer];  Service: Ophthalmology;  Laterality: Left;  . COLONOSCOPY     a. 2010  . CORONARY STENT INTERVENTION N/A 04/26/2016   Procedure: Coronary Stent Intervention;  Surgeon: Isaias Cowman, MD;  Location: [facility];  Service: Cardiovascular;  Laterality: N/A;  . LEFT HEART CATH AND CORONARY ANGIOGRAPHY N/A 04/26/2016   Procedure: Left Heart Cath and Coronary Angiography;  Surgeon: Isaias Cowman, MD;  Location: [facility];  Service: Cardiovascular;  Laterality: N/A;  . LEFT HEART CATH AND CORONARY ANGIOGRAPHY N/A 05/05/2016   Procedure: Left Heart Cath and Coronary Angiography;  Surgeon: Wellington Hampshire, MD;  Location: [facility];  Service: Cardiovascular;  Laterality: N/A;  . TONSILLECTOMY     Family History  Problem Relation Age of Onset  . Colon cancer Sister   . Melanoma Sister   . Hyperlipidemia Brother   . Hypertension Mother   . Lupus Mother  died 73 from surgical complications  . Alzheimer's disease Brother        unknown  . Kidney disease Father        died 43  . Hypertension Father   . Alcohol abuse Father    Social History  Substance Use Topics  . Smoking status: Former Smoker    Packs/day: 0.25    Years: 20.00    Types: Cigarettes    Quit date: 01/02/1971  . Smokeless tobacco: Never Used     Comment: smoked a few cigarettes/day x 20 yrs - quit @ age 49.  Marland Kitchen Alcohol use No    Allergies  Allergen Reactions  . Fenofibrate Other (See Comments)    Myalgias/gi upset   . Statins Other (See Comments)    Myalgias   . Sulfonamide Derivatives Nausea And Vomiting    REACTION: nausea   Prior to Admission medications   Medication Sig Start Date End Date Taking? Authorizing Provider  albuterol (PROVENTIL HFA;VENTOLIN HFA) 108 (90 BASE) MCG/ACT inhaler Inhale 2 puffs into the lungs every 6 (six) hours as needed. Wheezing or shortness of breath   Yes [provider]  amoxicillin-clavulanate (AUGMENTIN) 875-125 MG tablet Take 1 tablet by mouth 2 (two) times daily. 07/12/16  Yes Danford, Valetta Fuller D, NP  apixaban (ELIQUIS) 5 MG TABS tablet Take 10 mg (2 tabs) twice a day till 05/22/16 and then from 05/23/16 take 1 tab (5 mg ) two times a day 05/17/16  Yes Fritzi Mandes, MD  aspirin 325 MG EC tablet Take 325 mg by mouth daily.   Yes [provider]  bisoprolol (ZEBETA) 10 MG tablet Take 1 tablet (10 mg total) by mouth daily. 07/02/16  Yes Opalski, Deborah, DO  escitalopram (LEXAPRO) 10 MG tablet TAKE 1 TABLET (10 MG TOTAL) BY MOUTH DAILY. 04/27/16  Yes Opalski, Deborah, DO  fluticasone (FLONASE) 50 MCG/ACT nasal spray Place 2 sprays into the nose daily. Nasal congestion Rarely used    Yes [provider]  furosemide (LASIX) 40 MG tablet Take 1 tablet (40 mg total) by mouth daily. Patient taking differently: Take 60 mg by mouth daily.  05/21/16 05/21/17 Yes Sudini, Alveta Heimlich, MD  isosorbide mononitrate (IMDUR) 30 MG 24 hr tablet Take 1 tablet (30 mg total) by mouth daily. 04/30/16  Yes Fritzi Mandes, MD  montelukast (SINGULAIR) 10 MG tablet Take 10 mg by mouth at bedtime.   Yes [provider]  naphazoline-pheniramine (NAPHCON-A) 0.025-0.3 % ophthalmic solution Place 1 drop into both eyes 4 (four) times daily as needed for irritation.   Yes [provider]  nitroGLYCERIN (NITROSTAT) 0.3 MG SL tablet Place 0.3 mg under the tongue every 5 (five) minutes as  needed for chest pain.   Yes [provider]   Review of Systems  Constitutional: Negative for appetite change and fatigue.  HENT: Positive for congestion (ear congestion). Negative for postnasal drip and sore throat.   Eyes: Negative.   Respiratory: Positive for shortness of breath. Negative for cough and chest tightness.   Cardiovascular: Negative for chest pain, palpitations and leg swelling.  Gastrointestinal: Negative for abdominal distention and abdominal pain.  Endocrine: Negative.   Genitourinary: Negative.   Musculoskeletal: Positive for neck pain. Negative for back pain.  Skin: Negative.   Allergic/Immunologic: Negative.   Neurological: Negative for dizziness and light-headedness.  Hematological: Negative for adenopathy. Does not bruise/bleed easily.  Psychiatric/Behavioral: Negative for dysphoric mood and sleep disturbance (sleeping on 2 pillows with oxygen at 2L around the clock). The  patient is not nervous/anxious.    Vitals:   07/18/16 1258  BP: (!) 103/55  Pulse: 73  Resp: 18  SpO2: 93%  Weight: 161 lb 8 oz (73.3 kg)  Height: 5\' 1"  (1.549 m)   Wt Readings from Last 3 Encounters:  07/18/16 161 lb 8 oz (73.3 kg)  07/12/16 164 lb (74.4 kg)  06/14/16 162 lb 2 oz (73.5 kg)   Lab Results  Component Value Date   CREATININE 1.18 (H) 07/12/2016   CREATININE 1.08 (H) 05/21/2016   CREATININE 1.07 (H) 05/20/2016    Physical Exam  Constitutional: She is oriented to person, place, and time. She appears well-developed and well-nourished.  HENT:  Head: Normocephalic and atraumatic.  Neck: Normal range of motion. Neck supple. No JVD present.  Cardiovascular: Normal rate and regular rhythm.   Pulmonary/Chest: Effort normal. She has no wheezes. She has no rales.  Abdominal: Soft. She exhibits no distension. There is no tenderness.  Musculoskeletal: She exhibits no edema or tenderness.  Neurological: She is alert and oriented to person, place, and time.  Skin:  Skin is warm and dry.  Psychiatric: She has a normal mood and affect. Her behavior is normal. Thought content normal.  Nursing note and vitals reviewed.   Assessment & Plan:  1: Chronic heart failure with reduced ejection fraction- - NYHA class II - euvolemic today - already weighing daily and she has parameters to take an extra furosemide for an overnight weight gain of 3 pounds or more. Also advised her to call for a weekly weight gain of >5 pounds - not adding salt and her daughter doesn't cook with salt either. Encouraged her to continue following a 2000mg  sodium diet - may be limited in adding entresto due to blood pressure - currently has [facility] home health coming out - currently taking furosemide 40mg  AM/ 20mg  PM - REDS vest reading was 32% - saw cardiologist Nehemiah Massed) 06/18/16 and returns 07/23/16  2: HTN- - BP on the low side - denies dizziness - recently saw PCP (Opalski) 07/12/16  Medication bottles were reviewed.  Return in 6 months or sooner for any questions/problems before then  Return here in 1 month or sooner for any questions/problems before then.

## 2016-07-20 DIAGNOSIS — I350 Nonrheumatic aortic (valve) stenosis: Secondary | ICD-10-CM | POA: Diagnosis not present

## 2016-07-20 DIAGNOSIS — F4323 Adjustment disorder with mixed anxiety and depressed mood: Secondary | ICD-10-CM | POA: Diagnosis not present

## 2016-07-20 DIAGNOSIS — I110 Hypertensive heart disease with heart failure: Secondary | ICD-10-CM | POA: Diagnosis not present

## 2016-07-20 DIAGNOSIS — I252 Old myocardial infarction: Secondary | ICD-10-CM | POA: Diagnosis not present

## 2016-07-20 DIAGNOSIS — I447 Left bundle-branch block, unspecified: Secondary | ICD-10-CM | POA: Diagnosis not present

## 2016-07-20 DIAGNOSIS — M150 Primary generalized (osteo)arthritis: Secondary | ICD-10-CM | POA: Diagnosis not present

## 2016-07-20 DIAGNOSIS — I429 Cardiomyopathy, unspecified: Secondary | ICD-10-CM | POA: Diagnosis not present

## 2016-07-20 DIAGNOSIS — J45909 Unspecified asthma, uncomplicated: Secondary | ICD-10-CM | POA: Diagnosis not present

## 2016-07-20 DIAGNOSIS — I5042 Chronic combined systolic (congestive) and diastolic (congestive) heart failure: Secondary | ICD-10-CM | POA: Diagnosis not present

## 2016-07-21 DIAGNOSIS — I2699 Other pulmonary embolism without acute cor pulmonale: Secondary | ICD-10-CM | POA: Diagnosis not present

## 2016-07-21 DIAGNOSIS — R0602 Shortness of breath: Secondary | ICD-10-CM | POA: Diagnosis not present

## 2016-07-21 DIAGNOSIS — I5043 Acute on chronic combined systolic (congestive) and diastolic (congestive) heart failure: Secondary | ICD-10-CM | POA: Diagnosis not present

## 2016-07-23 DIAGNOSIS — I5042 Chronic combined systolic (congestive) and diastolic (congestive) heart failure: Secondary | ICD-10-CM | POA: Diagnosis not present

## 2016-07-23 DIAGNOSIS — I429 Cardiomyopathy, unspecified: Secondary | ICD-10-CM | POA: Diagnosis not present

## 2016-07-23 DIAGNOSIS — J45909 Unspecified asthma, uncomplicated: Secondary | ICD-10-CM | POA: Diagnosis not present

## 2016-07-23 DIAGNOSIS — I252 Old myocardial infarction: Secondary | ICD-10-CM | POA: Diagnosis not present

## 2016-07-23 DIAGNOSIS — I10 Essential (primary) hypertension: Secondary | ICD-10-CM | POA: Diagnosis not present

## 2016-07-23 DIAGNOSIS — I110 Hypertensive heart disease with heart failure: Secondary | ICD-10-CM | POA: Diagnosis not present

## 2016-07-23 DIAGNOSIS — I5022 Chronic systolic (congestive) heart failure: Secondary | ICD-10-CM | POA: Diagnosis not present

## 2016-07-23 DIAGNOSIS — I2510 Atherosclerotic heart disease of native coronary artery without angina pectoris: Secondary | ICD-10-CM | POA: Diagnosis not present

## 2016-07-23 DIAGNOSIS — F4323 Adjustment disorder with mixed anxiety and depressed mood: Secondary | ICD-10-CM | POA: Diagnosis not present

## 2016-07-23 DIAGNOSIS — I2109 ST elevation (STEMI) myocardial infarction involving other coronary artery of anterior wall: Secondary | ICD-10-CM | POA: Diagnosis not present

## 2016-07-23 DIAGNOSIS — I25118 Atherosclerotic heart disease of native coronary artery with other forms of angina pectoris: Secondary | ICD-10-CM | POA: Diagnosis not present

## 2016-07-23 DIAGNOSIS — I447 Left bundle-branch block, unspecified: Secondary | ICD-10-CM | POA: Diagnosis not present

## 2016-07-23 DIAGNOSIS — I350 Nonrheumatic aortic (valve) stenosis: Secondary | ICD-10-CM | POA: Diagnosis not present

## 2016-07-23 DIAGNOSIS — M150 Primary generalized (osteo)arthritis: Secondary | ICD-10-CM | POA: Diagnosis not present

## 2016-07-25 DIAGNOSIS — M150 Primary generalized (osteo)arthritis: Secondary | ICD-10-CM | POA: Diagnosis not present

## 2016-07-25 DIAGNOSIS — I5042 Chronic combined systolic (congestive) and diastolic (congestive) heart failure: Secondary | ICD-10-CM | POA: Diagnosis not present

## 2016-07-25 DIAGNOSIS — I350 Nonrheumatic aortic (valve) stenosis: Secondary | ICD-10-CM | POA: Diagnosis not present

## 2016-07-25 DIAGNOSIS — I429 Cardiomyopathy, unspecified: Secondary | ICD-10-CM | POA: Diagnosis not present

## 2016-07-25 DIAGNOSIS — I110 Hypertensive heart disease with heart failure: Secondary | ICD-10-CM | POA: Diagnosis not present

## 2016-07-25 DIAGNOSIS — J45909 Unspecified asthma, uncomplicated: Secondary | ICD-10-CM | POA: Diagnosis not present

## 2016-07-25 DIAGNOSIS — I447 Left bundle-branch block, unspecified: Secondary | ICD-10-CM | POA: Diagnosis not present

## 2016-07-25 DIAGNOSIS — F4323 Adjustment disorder with mixed anxiety and depressed mood: Secondary | ICD-10-CM | POA: Diagnosis not present

## 2016-07-25 DIAGNOSIS — I252 Old myocardial infarction: Secondary | ICD-10-CM | POA: Diagnosis not present

## 2016-07-26 ENCOUNTER — Other Ambulatory Visit: Payer: Self-pay | Admitting: *Deleted

## 2016-07-26 NOTE — Patient Outreach (Signed)
[facility]) Care Management  07/26/2016  Wanda Davenport 1923/11/21 [id number]   Telephone follow up call Placed call to patient daughter [name] as requested at last visit.  Daughter reports patient is doing fairly well, tolerating home physical therapy visit and exercises, still needs encouragement with continuing walking plan on days therapy not visiting. Patient continues to wear oxygen at 1 liter.  Patient weighing daily on well care tele monitoring scales and not weight increases greater than 2 pounds in a day and 5 in a week, daily weight ranges 159 to 160. Daughter discussed patient  recent cardiology visit and lasix changed from 60 mg daily to 40 mg.   Discussed whether patient had agreed to medical alert system daughter states not yet. Discussed plan after home health completes, per daughter patient will need new scales and she will work on getting that. Daughter discussed speaking with patient again regarding  Care Connection program and making call at visit , and in person would be the best plan. Patient has completed 60 days of transition of care program. Will benefit from continued follow up for care coordination needs.  Daughter is continuing to stay with patient at this time.   Plan Will plan home visit in the next 2 weeks for continued care coordination needs related to chronic disease management . Reviewed notifying MD of worsening of symptoms of shortness of breath, chest pain, weight gain of 2 pounds in a day and 5 in a week.     [name], RN, [place] [facility] [phone]- Mobile [phone]- Toll Free Main Office

## 2016-07-27 DIAGNOSIS — I429 Cardiomyopathy, unspecified: Secondary | ICD-10-CM | POA: Diagnosis not present

## 2016-07-27 DIAGNOSIS — I447 Left bundle-branch block, unspecified: Secondary | ICD-10-CM | POA: Diagnosis not present

## 2016-07-27 DIAGNOSIS — I252 Old myocardial infarction: Secondary | ICD-10-CM | POA: Diagnosis not present

## 2016-07-27 DIAGNOSIS — I5042 Chronic combined systolic (congestive) and diastolic (congestive) heart failure: Secondary | ICD-10-CM | POA: Diagnosis not present

## 2016-07-27 DIAGNOSIS — I110 Hypertensive heart disease with heart failure: Secondary | ICD-10-CM | POA: Diagnosis not present

## 2016-07-27 DIAGNOSIS — M150 Primary generalized (osteo)arthritis: Secondary | ICD-10-CM | POA: Diagnosis not present

## 2016-07-27 DIAGNOSIS — J45909 Unspecified asthma, uncomplicated: Secondary | ICD-10-CM | POA: Diagnosis not present

## 2016-07-27 DIAGNOSIS — F4323 Adjustment disorder with mixed anxiety and depressed mood: Secondary | ICD-10-CM | POA: Diagnosis not present

## 2016-07-27 DIAGNOSIS — I350 Nonrheumatic aortic (valve) stenosis: Secondary | ICD-10-CM | POA: Diagnosis not present

## 2016-07-30 DIAGNOSIS — J45909 Unspecified asthma, uncomplicated: Secondary | ICD-10-CM | POA: Diagnosis not present

## 2016-07-30 DIAGNOSIS — I429 Cardiomyopathy, unspecified: Secondary | ICD-10-CM | POA: Diagnosis not present

## 2016-07-30 DIAGNOSIS — I5042 Chronic combined systolic (congestive) and diastolic (congestive) heart failure: Secondary | ICD-10-CM | POA: Diagnosis not present

## 2016-07-30 DIAGNOSIS — I110 Hypertensive heart disease with heart failure: Secondary | ICD-10-CM | POA: Diagnosis not present

## 2016-07-30 DIAGNOSIS — I350 Nonrheumatic aortic (valve) stenosis: Secondary | ICD-10-CM | POA: Diagnosis not present

## 2016-07-30 DIAGNOSIS — I252 Old myocardial infarction: Secondary | ICD-10-CM | POA: Diagnosis not present

## 2016-07-30 DIAGNOSIS — F4323 Adjustment disorder with mixed anxiety and depressed mood: Secondary | ICD-10-CM | POA: Diagnosis not present

## 2016-07-30 DIAGNOSIS — M150 Primary generalized (osteo)arthritis: Secondary | ICD-10-CM | POA: Diagnosis not present

## 2016-07-30 DIAGNOSIS — I447 Left bundle-branch block, unspecified: Secondary | ICD-10-CM | POA: Diagnosis not present

## 2016-07-31 DIAGNOSIS — F4323 Adjustment disorder with mixed anxiety and depressed mood: Secondary | ICD-10-CM | POA: Diagnosis not present

## 2016-07-31 DIAGNOSIS — M150 Primary generalized (osteo)arthritis: Secondary | ICD-10-CM | POA: Diagnosis not present

## 2016-07-31 DIAGNOSIS — I110 Hypertensive heart disease with heart failure: Secondary | ICD-10-CM | POA: Diagnosis not present

## 2016-07-31 DIAGNOSIS — I5042 Chronic combined systolic (congestive) and diastolic (congestive) heart failure: Secondary | ICD-10-CM | POA: Diagnosis not present

## 2016-07-31 DIAGNOSIS — I447 Left bundle-branch block, unspecified: Secondary | ICD-10-CM | POA: Diagnosis not present

## 2016-07-31 DIAGNOSIS — I252 Old myocardial infarction: Secondary | ICD-10-CM | POA: Diagnosis not present

## 2016-07-31 DIAGNOSIS — I429 Cardiomyopathy, unspecified: Secondary | ICD-10-CM | POA: Diagnosis not present

## 2016-07-31 DIAGNOSIS — I350 Nonrheumatic aortic (valve) stenosis: Secondary | ICD-10-CM | POA: Diagnosis not present

## 2016-07-31 DIAGNOSIS — J45909 Unspecified asthma, uncomplicated: Secondary | ICD-10-CM | POA: Diagnosis not present

## 2016-08-02 ENCOUNTER — Telehealth: Payer: Self-pay | Admitting: Family Medicine

## 2016-08-02 NOTE — Telephone Encounter (Signed)
Lorriane Shire from [place] called wanting to speak with someone clinical about patient.

## 2016-08-03 ENCOUNTER — Telehealth: Payer: Self-pay | Admitting: Family Medicine

## 2016-08-03 DIAGNOSIS — I350 Nonrheumatic aortic (valve) stenosis: Secondary | ICD-10-CM | POA: Diagnosis not present

## 2016-08-03 DIAGNOSIS — I110 Hypertensive heart disease with heart failure: Secondary | ICD-10-CM | POA: Diagnosis not present

## 2016-08-03 DIAGNOSIS — I5042 Chronic combined systolic (congestive) and diastolic (congestive) heart failure: Secondary | ICD-10-CM | POA: Diagnosis not present

## 2016-08-03 DIAGNOSIS — M150 Primary generalized (osteo)arthritis: Secondary | ICD-10-CM | POA: Diagnosis not present

## 2016-08-03 DIAGNOSIS — F4323 Adjustment disorder with mixed anxiety and depressed mood: Secondary | ICD-10-CM | POA: Diagnosis not present

## 2016-08-03 DIAGNOSIS — I447 Left bundle-branch block, unspecified: Secondary | ICD-10-CM | POA: Diagnosis not present

## 2016-08-03 DIAGNOSIS — I252 Old myocardial infarction: Secondary | ICD-10-CM | POA: Diagnosis not present

## 2016-08-03 DIAGNOSIS — J45909 Unspecified asthma, uncomplicated: Secondary | ICD-10-CM | POA: Diagnosis not present

## 2016-08-03 DIAGNOSIS — I429 Cardiomyopathy, unspecified: Secondary | ICD-10-CM | POA: Diagnosis not present

## 2016-08-03 NOTE — Telephone Encounter (Signed)
Called back - verbal orders to send out social worker to talk with the patient about asstiant living.  MPulliam, CMA/RT(R)

## 2016-08-03 NOTE — Telephone Encounter (Signed)
Wanda Davenport from [place] left a VM during lunch trying to reach you for an unspecified reason. She can reached at [phone]

## 2016-08-03 NOTE — Telephone Encounter (Signed)
Called left message to call back. MPulliam, CMA/RT(R)  

## 2016-08-07 ENCOUNTER — Other Ambulatory Visit: Payer: Self-pay | Admitting: *Deleted

## 2016-08-07 DIAGNOSIS — I447 Left bundle-branch block, unspecified: Secondary | ICD-10-CM | POA: Diagnosis not present

## 2016-08-07 DIAGNOSIS — F4323 Adjustment disorder with mixed anxiety and depressed mood: Secondary | ICD-10-CM | POA: Diagnosis not present

## 2016-08-07 DIAGNOSIS — I110 Hypertensive heart disease with heart failure: Secondary | ICD-10-CM | POA: Diagnosis not present

## 2016-08-07 DIAGNOSIS — I5042 Chronic combined systolic (congestive) and diastolic (congestive) heart failure: Secondary | ICD-10-CM | POA: Diagnosis not present

## 2016-08-07 DIAGNOSIS — M150 Primary generalized (osteo)arthritis: Secondary | ICD-10-CM | POA: Diagnosis not present

## 2016-08-07 DIAGNOSIS — J45909 Unspecified asthma, uncomplicated: Secondary | ICD-10-CM | POA: Diagnosis not present

## 2016-08-07 DIAGNOSIS — I252 Old myocardial infarction: Secondary | ICD-10-CM | POA: Diagnosis not present

## 2016-08-07 DIAGNOSIS — I429 Cardiomyopathy, unspecified: Secondary | ICD-10-CM | POA: Diagnosis not present

## 2016-08-07 DIAGNOSIS — I350 Nonrheumatic aortic (valve) stenosis: Secondary | ICD-10-CM | POA: Diagnosis not present

## 2016-08-07 NOTE — Patient Outreach (Addendum)
[facility]) Care Management   08/07/2016  Wanda Davenport 1923-12-17 [id number]  TIMOTHY TOWNSEL is an 83 y.o. female  Subjective:  Patient complaint of feeling shortness of breath with activity walking, relief after resting in chair. Patient and daughter discussed weight gain up to 168 pounds the most she has weighed in the last month Daughter discussed concern regarding patient food intake for last few days including  chicken salad,which patient has been eating saltine crackers , mistakenly. instead of the no salt crackers that has been purchased for her.Daughter discussed patient current lasix schedule . Daughter discussed sometimes patient doesn't listen to her regarding diet choices, and they disagree. Daughter discussed Advice worker to visit on today, to assess for needs  Patient reports tolerating walking activity in home, as outlined by therapist and her usual daily activity .  Patient continues to wear oxygen at all times. .  Objective:  BP 120/68 (BP Location: Right Arm, Patient Position: Sitting, Cuff Size: Normal)   Pulse 66   Resp 20   Wt 168 lb (76.2 kg)   SpO2 96%   BMI 31.74 kg/m  Review of Systems  Constitutional: Negative.   HENT: Negative.   Eyes: Negative.   Respiratory: Positive for shortness of breath.   Cardiovascular: Positive for leg swelling. Negative for chest pain.  Gastrointestinal: Negative.  Negative for diarrhea.  Genitourinary: Negative.   Musculoskeletal: Negative.   Skin: Negative.   Neurological: Negative.   Endo/Heme/Allergies: Negative.   Psychiatric/Behavioral: Negative.     Physical Exam  Constitutional: She is oriented to person, place, and time. She appears well-developed and well-nourished.  Cardiovascular: Normal rate.   Respiratory: Effort normal. No respiratory distress. She has no wheezes.  GI: Soft.  Neurological: She is alert and oriented to person, place, and time.  Skin: Skin is warm and dry.   Psychiatric: She has a normal mood and affect. Her behavior is normal. Judgment and thought content normal.    Encounter Medications:   Outpatient Encounter Prescriptions as of 08/07/2016  Medication Sig Note  . albuterol (PROVENTIL HFA;VENTOLIN HFA) 108 (90 BASE) MCG/ACT inhaler Inhale 2 puffs into the lungs every 6 (six) hours as needed. Wheezing or shortness of breath   . apixaban (ELIQUIS) 5 MG TABS tablet Take 10 mg (2 tabs) twice a day till 05/22/16 and then from 05/23/16 take 1 tab (5 mg ) two times a day   . aspirin 325 MG EC tablet Take 325 mg by mouth daily.   . bisoprolol (ZEBETA) 10 MG tablet Take 1 tablet (10 mg total) by mouth daily.   Marland Kitchen escitalopram (LEXAPRO) 10 MG tablet TAKE 1 TABLET (10 MG TOTAL) BY MOUTH DAILY.   . furosemide (LASIX) 40 MG tablet Take 1 tablet (40 mg total) by mouth daily. (Patient taking differently: Take 60 mg by mouth daily. )   . isosorbide mononitrate (IMDUR) 30 MG 24 hr tablet Take 1 tablet (30 mg total) by mouth daily.   . montelukast (SINGULAIR) 10 MG tablet Take 10 mg by mouth at bedtime.   . nitroGLYCERIN (NITROSTAT) 0.3 MG SL tablet Place 0.3 mg under the tongue every 5 (five) minutes as needed for chest pain. 08/07/2016: On hand if needed  . amoxicillin-clavulanate (AUGMENTIN) 875-125 MG tablet Take 1 tablet by mouth 2 (two) times daily. (Patient not taking: Reported on 08/07/2016)   . fluticasone (FLONASE) 50 MCG/ACT nasal spray Place 2 sprays into the nose daily. Nasal congestion Rarely used    .  naphazoline-pheniramine (NAPHCON-A) 0.025-0.3 % ophthalmic solution Place 1 drop into both eyes 4 (four) times daily as needed for irritation.    No facility-administered encounter medications on file as of 08/07/2016.     Functional Status:   In your present state of health, do you have any difficulty performing the following activities: 08/07/2016 07/18/2016  Hearing? Tempie Donning  Vision? N N  Difficulty concentrating or making decisions? N N  Walking or climbing  stairs? Y Y  Dressing or bathing? N N  Doing errands, shopping? Tempie Donning  Comment family helps -  Conservation officer, nature and eating ? Y -  Using the Toilet? N -  In the past six months, have you accidently leaked urine? N -  Do you have problems with loss of bowel control? Y -  Managing your Medications? Y -  Comment family helps -  Managing your Finances? Y -  Comment with help from son -  Housekeeping or managing your Housekeeping? Y -  Comment daughter helps -  Some recent data might be hidden    Fall/Depression Screening:    Fall Risk  08/07/2016 07/18/2016 06/14/2016  Falls in the past year? No No No  Risk for fall due to : Impaired balance/gait - -  Risk for fall due to: Comment uses walker at times  - -   PHQ 2/9 Scores 08/07/2016 07/18/2016 06/14/2016 05/31/2016 05/22/2016 12/13/2015 10/31/2015  PHQ - 2 Score 1 0 0 0 0 3 3  PHQ- 9 Score - - - - 2 12 11     Assessment:   [facility] weekly, tele monitoring system .   1.Congestive heart failure- continues daily weights, increased weight over last month. Would benefit from continued review  on low salt diet,zone heart  failure education, when to notify MD . Taking medications as prescribed.  2. Social  - Daughter lives with patient currently,her plan is to move once she finds a new home . Patient discussed she wants to stay in her home as long as able.   Discussed Care connection  Program as support for chronic disease management patient in agreement with her daughter speaking with representative from Care connection .  Plan:  1.Placed call to [facility] office regarding 2 pound weight gain in the last 4 days, overall weight gain the last month, symptoms of lower edema swelling and sob with activity. Able to discuss concern with Janett Billow . Prior to me leaving patient home , daughter received phone call from office reports to keep on same lasix dose, watch salt and notify of increased symptoms, and weight  gain in next day.  Reviewed low salt diet information, label review,daily salt restrictions. Reviewed worsening of Heart failure symptoms and action plan to notify MD of .               2. Placed call to [facility] spoke with Manus Gunning to give patient address information and  [name] contact information. Will send PCP this transition of care visit note and quarterly update, and care connection conversation.  Will follow up with telephone call to patient  in the next week.  THN CM Care Plan Problem One     Most Recent Value  Care Plan Problem One  High risk for readmission related to heart failure   Role Documenting the Problem One  Care Management [place] for Problem One  Active  [facility] Long Term Goal   Patient will not experience  a hospital readmission in the next 31 days   [place] Term Goal Start Date  07/26/16  Interventions for Problem One Long Term Goal  Advised regarding taking medications as prescribed, notifying MD of new or worsening of symptoms   THN CM Short Term Goal #1   Patient will continue to weigh daily and keep a record in the next 30 days   THN CM Short Term Goal #1 Start Date  07/26/16  [facility] CM Short Term Goal #1 Met Date  08/07/16  [facility] CM Short Term Goal #2   Patient will report continuing daily walking exercises at least 5 days a week in the next 30 days   THN CM Short Term Goal #2 Start Date  07/26/16  Interventions for Short Term Goal #2  Reinforced continued walking activity as tolerated   THN CM Short Term Goal #3  Patient will report increased knowledge of symptoms of heart failure and action plan to follow  in the next 30 days   THN CM Short Term Goal #3 Start Date  07/26/16  Interventions for Short Tern Goal #3  Teach back on symptoms of worsening heart failure to notify MD of   [facility] CM Short Term Goal #4  Patient will report increased knowledge foods to include on low salt diet restriction of 2 grams a day in the next 30 days   THN CM Short Term  Goal #4 Start Date  08/07/16  Interventions for Short Term Goal #4  Provided and reviewed hi/low salt diet sheet, reviewed label on food items in home       [name], RN, [place] Management Coordinator  [phone]- Mobile [phone]- [place]

## 2016-08-08 DIAGNOSIS — I252 Old myocardial infarction: Secondary | ICD-10-CM | POA: Diagnosis not present

## 2016-08-08 DIAGNOSIS — I429 Cardiomyopathy, unspecified: Secondary | ICD-10-CM | POA: Diagnosis not present

## 2016-08-08 DIAGNOSIS — I5042 Chronic combined systolic (congestive) and diastolic (congestive) heart failure: Secondary | ICD-10-CM | POA: Diagnosis not present

## 2016-08-08 DIAGNOSIS — I110 Hypertensive heart disease with heart failure: Secondary | ICD-10-CM | POA: Diagnosis not present

## 2016-08-08 DIAGNOSIS — J45909 Unspecified asthma, uncomplicated: Secondary | ICD-10-CM | POA: Diagnosis not present

## 2016-08-08 DIAGNOSIS — F4323 Adjustment disorder with mixed anxiety and depressed mood: Secondary | ICD-10-CM | POA: Diagnosis not present

## 2016-08-08 DIAGNOSIS — M150 Primary generalized (osteo)arthritis: Secondary | ICD-10-CM | POA: Diagnosis not present

## 2016-08-08 DIAGNOSIS — I350 Nonrheumatic aortic (valve) stenosis: Secondary | ICD-10-CM | POA: Diagnosis not present

## 2016-08-08 DIAGNOSIS — I447 Left bundle-branch block, unspecified: Secondary | ICD-10-CM | POA: Diagnosis not present

## 2016-08-14 ENCOUNTER — Telehealth: Payer: Self-pay | Admitting: Family Medicine

## 2016-08-14 ENCOUNTER — Ambulatory Visit (INDEPENDENT_AMBULATORY_CARE_PROVIDER_SITE_OTHER): Payer: Medicare HMO | Admitting: Adult Health

## 2016-08-14 ENCOUNTER — Encounter: Payer: Self-pay | Admitting: Adult Health

## 2016-08-14 VITALS — BP 104/67 | HR 71 | Temp 97.5°F | Ht 61.0 in | Wt 168.8 lb

## 2016-08-14 DIAGNOSIS — R0602 Shortness of breath: Secondary | ICD-10-CM

## 2016-08-14 DIAGNOSIS — I5022 Chronic systolic (congestive) heart failure: Secondary | ICD-10-CM | POA: Diagnosis not present

## 2016-08-14 DIAGNOSIS — J301 Allergic rhinitis due to pollen: Secondary | ICD-10-CM

## 2016-08-14 MED ORDER — MONTELUKAST SODIUM 10 MG PO TABS: 10.0000 mg | ORAL_TABLET | Freq: Every day | ORAL | 1 refills | Status: AC

## 2016-08-14 NOTE — Telephone Encounter (Signed)
Manus Gunning from [place] called to give notification that patient is enrolling in their Pallative care program & they wanted to inform Provider & get approval. ---Pls call Manus Gunning at [phone] to give approval. --glh

## 2016-08-14 NOTE — Assessment & Plan Note (Signed)
Instructed to call Cards about weight gain and lower ext edema L foot > R foot.

## 2016-08-14 NOTE — Assessment & Plan Note (Signed)
Re-started on Montelukast 10mg  daily. Continue to stay hydrated and use Fluticasone as directed.

## 2016-08-14 NOTE — Assessment & Plan Note (Signed)
On 1/2 L O2 Sat 97% No signs of acute respiratory distress noted.

## 2016-08-14 NOTE — Telephone Encounter (Signed)
Called and left message for Linus Galas, CMA/RT(R)

## 2016-08-14 NOTE — Progress Notes (Signed)
Subjective:    Patient ID: Wanda Davenport, female    DOB: Dec 16, 1923, 83 y.o.   MRN: [id number]  HPI:  [name] is here for continued constant, clear nasal drainage.  She also has intermittent, non-productive cough with mild dyspnea at rest that has been occurring for 1 week. She has respiratory/seasonal allergy sx's all summer.  She also reports increased fatigue.  She has been drinking water and trying to avoid Na++ in diet. She also reports worsening edema in lower extremities and gradual weight gain-today's wt 168.  Pt and pt's daughter estimate that her normal weight is in low 160s.  She reports medication compliance and denies SE.  She denies CP/palpitations/fever/night sweats/poor appetite.  She has been working with home healthy/therapy trying to increase strength/stamina. Daughter at [facility] at time of OV.  Patient Care Team    Relationship Specialty Notifications Start End  Mellody Dance, DO PCP - General Family Medicine  10/24/15   Christene Slates, MD  Dermatology  10/24/15   Leandrew Koyanagi, MD Referring Physician Ophthalmology  11/06/15   Deboraha Sprang, MD Consulting Physician Cardiology  11/06/15   Gatha Mayer, MD Consulting Physician Gastroenterology  11/06/15    Comment: she used to see Delfin Edis, MD  Corey Skains, MD Consulting Physician Cardiology  05/06/16    Comment: CHF specialist  Alfonzo Feller, [place] Management  Admissions 05/17/16   Alisa Graff, [place] Nurse Practitioner Family Medicine  06/15/16    Comment: [facility] Failure [facility]    Patient Active Problem List   Diagnosis Date Noted  . Sinusitis, acute maxillary 07/12/2016  . Cough 07/12/2016  . Shortness of breath 07/12/2016  . Pleural effusion, left 07/12/2016  . Chronic systolic heart failure ([place]) 06/15/2016  . PE (pulmonary thromboembolism) ([place]) 05/19/2016  . Unstable angina ([place])   . STEMI (ST elevation myocardial infarction) ([place]) 04/26/2016  . Knee pain,  bilateral 04/10/2016  . Medication monitoring encounter 01/26/2016  . Intolerance of drug- statins 01/25/2016  . Counseling regarding end of life decision making 01/25/2016  . Hypertriglyceridemia 12/13/2015  . Low serum HDL 12/13/2015  . B12 deficiency 12/13/2015  . Claudication of left lower extremity ([place]) 12/13/2015  . Vitamin D deficiency 11/06/2015  . Adjustment disorder with mixed anxiety and depressed mood 11/06/2015  . Basal cell carcinoma 10/31/2015  . Environmental and seasonal allergies 10/31/2015  . Reactive airway disease- only spring and fall due to allergies 10/31/2015  . GAD (generalized anxiety disorder) 10/13/2015  . Atypical chest pain 04/16/2011  . Hyperlipidemia   . Pre-diabetes   . chronic LBBB (left bundle branch block)- since 2012   . Generalized OA   . Allergic rhinitis   . DIVERTICULOSIS, COLON 12/03/2007  . HTN (hypertension) 04/26/2007     Past Medical History:  Diagnosis Date  . Allergic rhinitis   . Anxiety   . Arthritis   . Atypical chest pain   . Bronchitis 01/14/2016  . Chronic diastolic CHF (congestive heart failure) (HCC)    Echo 06/2010 EF 60-65%, no RWMAs, grade 1 diastolic dysfunction, mild LAE, PASP 37mmHg.  Marland Kitchen Coronary artery disease    MI  . DIVERTICULOSIS, COLON   . HEMORRHOIDS, INTERNAL   . Hepatic steatosis   . Hyperlipidemia   . HYPERTENSION   . LBBB (left bundle branch block)    a. present on ECG 06/2010  . Osteopenia   . Pre-diabetes    A1c 6.0  Past Surgical History:  Procedure Laterality Date  . ABDOMINAL HYSTERECTOMY    . BREAST SURGERY    . CATARACT EXTRACTION W/PHACO Left 05/12/2014   Procedure: CATARACT EXTRACTION PHACO AND INTRAOCULAR LENS PLACEMENT (IOC);  Surgeon: Leandrew Koyanagi, MD;  Location: [place];  Service: Ophthalmology;  Laterality: Left;  . COLONOSCOPY     a. 2010  . CORONARY STENT INTERVENTION N/A 04/26/2016   Procedure: Coronary Stent Intervention;  Surgeon: Isaias Cowman, MD;  Location: [place] CV LAB;  Service: Cardiovascular;  Laterality: N/A;  . LEFT HEART CATH AND CORONARY ANGIOGRAPHY N/A 04/26/2016   Procedure: Left Heart Cath and Coronary Angiography;  Surgeon: Isaias Cowman, MD;  Location: [place] CV LAB;  Service: Cardiovascular;  Laterality: N/A;  . LEFT HEART CATH AND CORONARY ANGIOGRAPHY N/A 05/05/2016   Procedure: Left Heart Cath and Coronary Angiography;  Surgeon: Wellington Hampshire, MD;  Location: [place] [facility];  Service: Cardiovascular;  Laterality: N/A;  . TONSILLECTOMY       Family History  Problem Relation Age of Onset  . Colon cancer Sister   . Melanoma Sister   . Hyperlipidemia Brother   . Hypertension Mother   . Lupus Mother        died 76 from surgical complications  . Alzheimer's disease Brother        unknown  . Kidney disease Father        died 27  . Hypertension Father   . Alcohol abuse Father      History  Drug Use No     History  Alcohol Use No     History  Smoking Status  . Former Smoker  . Packs/day: 0.25  . Years: 20.00  . Types: Cigarettes  . Quit date: 01/02/1971  Smokeless Tobacco  . Never Used    Comment: smoked a few cigarettes/day x 20 yrs - quit @ age 51.     Outpatient Encounter Prescriptions as of 08/14/2016  Medication Sig Note  . albuterol (PROVENTIL HFA;VENTOLIN HFA) 108 (90 BASE) MCG/ACT inhaler Inhale 2 puffs into the lungs every 6 (six) hours as needed. Wheezing or shortness of breath   . apixaban (ELIQUIS) 5 MG TABS tablet Take 10 mg (2 tabs) twice a day till 05/22/16 and then from 05/23/16 take 1 tab (5 mg ) two times a day   . aspirin 325 MG EC tablet Take 325 mg by mouth daily.   . bisoprolol (ZEBETA) 10 MG tablet Take 1 tablet (10 mg total) by mouth daily.   Marland Kitchen escitalopram (LEXAPRO) 10 MG tablet TAKE 1 TABLET (10 MG TOTAL) BY MOUTH DAILY.   . fluticasone (FLONASE) 50 MCG/ACT nasal spray Place 2 sprays into the nose daily. Nasal congestion Rarely used     . furosemide (LASIX) 40 MG tablet Take 1.5 tablets by mouth daily.   . isosorbide mononitrate (IMDUR) 30 MG 24 hr tablet Take 1 tablet (30 mg total) by mouth daily.   . montelukast (SINGULAIR) 10 MG tablet Take 1 tablet (10 mg total) by mouth at bedtime.   . nitroGLYCERIN (NITROSTAT) 0.3 MG SL tablet Place 0.3 mg under the tongue every 5 (five) minutes as needed for chest pain. 08/07/2016: On hand if needed  . [DISCONTINUED] amoxicillin-clavulanate (AUGMENTIN) 875-125 MG tablet Take 1 tablet by mouth 2 (two) times daily.   . [DISCONTINUED] furosemide (LASIX) 40 MG tablet Take 1 tablet (40 mg total) by mouth daily. (Patient taking differently: Take 60 mg by mouth daily. )   . [  DISCONTINUED] montelukast (SINGULAIR) 10 MG tablet Take 10 mg by mouth at bedtime.   . [DISCONTINUED] naphazoline-pheniramine (NAPHCON-A) 0.025-0.3 % ophthalmic solution Place 1 drop into both eyes 4 (four) times daily as needed for irritation.    No facility-administered encounter medications on file as of 08/14/2016.     Allergies: Fenofibrate; Statins; and Sulfonamide derivatives  Body mass index is 31.89 kg/m.  Blood pressure 104/67, pulse 71, temperature (!) 97.5 F (36.4 C), temperature source Oral, height 5\' 1"  (1.549 m), weight 168 lb 12 oz (76.5 kg), SpO2 97 %.     Review of Systems  Constitutional: Positive for activity change and fatigue. Negative for appetite change, chills, diaphoresis, fever and unexpected weight change.  HENT: Positive for congestion, postnasal drip, rhinorrhea and sinus pressure. Negative for sinus pain, trouble swallowing and voice change.   Eyes: Negative for visual disturbance.  Respiratory: Positive for cough and shortness of breath. Negative for chest tightness, wheezing and stridor.   Cardiovascular: Positive for leg swelling. Negative for chest pain and palpitations.  Gastrointestinal: Negative for abdominal distention, abdominal pain, blood in stool, constipation, diarrhea,  nausea and vomiting.  Genitourinary: Negative for difficulty urinating.  Musculoskeletal: Positive for arthralgias, back pain, gait problem, joint swelling, myalgias, neck pain and neck stiffness.  Skin: Negative for color change, pallor, rash and wound.  Neurological: Negative for dizziness and headaches.  Hematological: Bruises/bleeds easily.  Psychiatric/Behavioral: Negative for dysphoric mood, self-injury and suicidal ideas. The patient is not nervous/anxious.        Objective:   Physical Exam  Constitutional: She is oriented to person, place, and time. She appears well-developed and well-nourished. No distress.  HENT:  Head: Normocephalic and atraumatic.  Right Ear: External ear and ear canal normal. Tympanic membrane is bulging. Tympanic membrane is not erythematous. Decreased hearing is noted.  Left Ear: External ear and ear canal normal. Tympanic membrane is bulging. Tympanic membrane is not erythematous. Decreased hearing is noted.  Nose: Mucosal edema and rhinorrhea present. Right sinus exhibits maxillary sinus tenderness. Right sinus exhibits no frontal sinus tenderness. Left sinus exhibits maxillary sinus tenderness. Left sinus exhibits no frontal sinus tenderness.  Mouth/Throat: Uvula is midline, oropharynx is clear and moist and mucous membranes are normal.  Cardiovascular: Normal rate, regular rhythm, normal heart sounds and intact distal pulses.   Pulmonary/Chest: Effort normal. No respiratory distress. She has decreased breath sounds in the right lower field and the left lower field. She has no wheezes. She has no rales. She exhibits no tenderness.  Neurological: She is alert and oriented to person, place, and time.  Skin: Skin is warm and dry. No rash noted. She is not diaphoretic. No erythema. No pallor.  Psychiatric: She has a normal mood and affect. Her behavior is normal. Judgment and thought content normal.  Nursing note and vitals reviewed.         Assessment &  Plan:   1. Shortness of breath   2. Allergic rhinitis due to pollen, unspecified seasonality   3. Chronic systolic heart failure (HCC)     Allergic rhinitis Re-started on Montelukast 10mg  daily. Continue to stay hydrated and use Fluticasone as directed.   Chronic systolic heart failure (HCC) Instructed to call Cards about weight gain and lower ext edema L foot > R foot.  Shortness of breath On 1/2 L O2 Sat 97% No signs of acute respiratory distress noted.    FOLLOW-UP:  Return if symptoms worsen or fail to improve.

## 2016-08-14 NOTE — Patient Instructions (Signed)
Allergic Rhinitis Allergic rhinitis is when the mucous membranes in the nose respond to allergens. Allergens are particles in the air that cause your body to have an allergic reaction. This causes you to release allergic antibodies. Through a chain of events, these eventually cause you to release histamine into the blood stream. Although meant to protect the body, it is this release of histamine that causes your discomfort, such as frequent sneezing, congestion, and an itchy, runny nose. What are the causes? Seasonal allergic rhinitis (hay fever) is caused by pollen allergens that may come from grasses, trees, and weeds. Year-round allergic rhinitis (perennial allergic rhinitis) is caused by allergens such as house dust mites, pet dander, and mold spores. What are the signs or symptoms?  Nasal stuffiness (congestion).  Itchy, runny nose with sneezing and tearing of the eyes. How is this diagnosed? Your health care provider can help you determine the allergen or allergens that trigger your symptoms. If you and your health care provider are unable to determine the allergen, skin or blood testing may be used. Your health care provider will diagnose your condition after taking your health history and performing a physical exam. Your health care provider may assess you for other related conditions, such as asthma, pink eye, or an ear infection. How is this treated? Allergic rhinitis does not have a cure, but it can be controlled by:  Medicines that block allergy symptoms. These may include allergy shots, nasal sprays, and oral antihistamines.  Avoiding the allergen.  Hay fever may often be treated with antihistamines in pill or nasal spray forms. Antihistamines block the effects of histamine. There are over-the-counter medicines that may help with nasal congestion and swelling around the eyes. Check with your health care provider before taking or giving this medicine. If avoiding the allergen or the  medicine prescribed do not work, there are many new medicines your health care provider can prescribe. Stronger medicine may be used if initial measures are ineffective. Desensitizing injections can be used if medicine and avoidance does not work. Desensitization is when a patient is given ongoing shots until the body becomes less sensitive to the allergen. Make sure you follow up with your health care provider if problems continue. Follow these instructions at home: It is not possible to completely avoid allergens, but you can reduce your symptoms by taking steps to limit your exposure to them. It helps to know exactly what you are allergic to so that you can avoid your specific triggers. Contact a health care provider if:  You have a fever.  You develop a cough that does not stop easily (persistent).  You have shortness of breath.  You start wheezing.  Symptoms interfere with normal daily activities. This information is not intended to replace advice given to you by your health care provider. Make sure you discuss any questions you have with your health care provider. Document Released: 09/12/2000 Document Revised: 08/19/2015 Document Reviewed: 08/25/2012 Elsevier Interactive Patient Education  2017 [employer].  Re-started on [place] take daily. Use Flonase as directed. Continue to stay hydrated and continue to reduce sodium in diet. Apply thin layer of Neosporin to L ear 1-2 times daily. Please call your cardiologist regarding weight gain, shortness of breath, and lower extremity edema. Please call clinic with any questions/concerns.

## 2016-08-15 ENCOUNTER — Other Ambulatory Visit: Payer: Self-pay | Admitting: *Deleted

## 2016-08-15 ENCOUNTER — Telehealth: Payer: Self-pay | Admitting: Adult Health

## 2016-08-15 NOTE — Telephone Encounter (Signed)
Pt's daughter states that she spoke with pharmacy this morning and they did receive the RX.  Charyl Bigger, CMA

## 2016-08-15 NOTE — Patient Outreach (Signed)
[facility]) Care Management  08/15/2016  LIBBIE BARTLEY December 03, 1923 [id number]   Follow telephone call  Spoke with patient daughter [name], she discussed patient  PCP visit on yesterday,related to problems with sinus. She reports patient weight is staying around 165- 168 lbs, she still has some swelling in lower legs, no worsening of shortness of breath she continues to wear oxygen at .5 liter and tolerate usual activity in home. Daughter discussed she has followed up with cardiology regarding current weight, patient to stay on same dose of lasix. Daughter discussed she has planned call from  [facility] representative this afternoon.  Plan Will follow up with patient in the next week by telephone Patient to notify cardiology of worsening of Heart failure symptoms of shortness of breath, weight gain or chest discomfort.   [name], RN, [place] Management Coordinator  [phone]- Mobile [phone]- Toll Free Main Office

## 2016-08-15 NOTE — Telephone Encounter (Signed)
Patient's daughter [facility] to pick [facility] prescription but the pharmacy said they had nothing for patient. I told the daughter based on our records it was sent in correctly and we have a confirmation of receipt. She is going to call them again this morning to make sure there was not a mistake. Can we check our side too?

## 2016-08-16 NOTE — Telephone Encounter (Signed)
Manus Gunning sent orders over to be signed. MPulliam, CMA/RT(R)

## 2016-08-21 DIAGNOSIS — I2699 Other pulmonary embolism without acute cor pulmonale: Secondary | ICD-10-CM | POA: Diagnosis not present

## 2016-08-21 DIAGNOSIS — I5043 Acute on chronic combined systolic (congestive) and diastolic (congestive) heart failure: Secondary | ICD-10-CM | POA: Diagnosis not present

## 2016-08-21 DIAGNOSIS — R0602 Shortness of breath: Secondary | ICD-10-CM | POA: Diagnosis not present

## 2016-08-22 ENCOUNTER — Telehealth: Payer: Self-pay | Admitting: Family Medicine

## 2016-08-22 DIAGNOSIS — J45909 Unspecified asthma, uncomplicated: Secondary | ICD-10-CM | POA: Diagnosis not present

## 2016-08-22 DIAGNOSIS — I350 Nonrheumatic aortic (valve) stenosis: Secondary | ICD-10-CM | POA: Diagnosis not present

## 2016-08-22 DIAGNOSIS — F4323 Adjustment disorder with mixed anxiety and depressed mood: Secondary | ICD-10-CM | POA: Diagnosis not present

## 2016-08-22 DIAGNOSIS — I5042 Chronic combined systolic (congestive) and diastolic (congestive) heart failure: Secondary | ICD-10-CM | POA: Diagnosis not present

## 2016-08-22 DIAGNOSIS — M150 Primary generalized (osteo)arthritis: Secondary | ICD-10-CM | POA: Diagnosis not present

## 2016-08-22 DIAGNOSIS — I447 Left bundle-branch block, unspecified: Secondary | ICD-10-CM | POA: Diagnosis not present

## 2016-08-22 DIAGNOSIS — I252 Old myocardial infarction: Secondary | ICD-10-CM | POA: Diagnosis not present

## 2016-08-22 DIAGNOSIS — I110 Hypertensive heart disease with heart failure: Secondary | ICD-10-CM | POA: Diagnosis not present

## 2016-08-22 DIAGNOSIS — I429 Cardiomyopathy, unspecified: Secondary | ICD-10-CM | POA: Diagnosis not present

## 2016-08-22 NOTE — Telephone Encounter (Signed)
Called and spoke to [place] and gave verbal auth to [facility] home health for 2 more weeks. MPulliam, CMA/RT(R)

## 2016-08-22 NOTE — Telephone Encounter (Signed)
Received call from Jonne Ply states needs Verbal Auth to continue Home Health for patient.--Please call her at [phone] says you can leave message on VM if she does not pick up.  --glh

## 2016-08-23 ENCOUNTER — Other Ambulatory Visit: Payer: Self-pay | Admitting: *Deleted

## 2016-08-23 NOTE — Patient Outreach (Signed)
[facility] Care Management  08/23/2016  Wanda Davenport 29-Jan-1923 [id number]   Telephone follow up call to patient daughter,caregiver Wanda Davenport, she reports patient is doing pretty good on today, they are going out to lunch. Patient continues to weigh daily, weights for the last 3 days has been 166, 165, 167. Patient without increase of shortness of breath or swelling. She continues to wear oxygen at .5 liters and gets winded after activity but recovers after rest.   Daughter discussed patient is still currently followed by [facility] home health RN, anticipate case closing in the next week.  Daughter discussed family plan to get blood pressure monitor, new scales and oxygen monitor this week.  Discussed medical alert monitor, states patient is not agreeable to getting it yet, but they continue to encourage.   Patient has home visit with care connections on 8/24.  Discussed with daughter after care [facility] is established with patient and managing care , [facility] would close case if no new concerns due to duplication of services, daughter voiced understanding. She expressed some of the benefits of programs.    Placed call to [facility] spoke with Justice Rocher she confirms patient was admitted to Palliative program 8/16 in home.   Plan Will place follow up call in the next week, and if no new concerns, care connection visit complete. Reinforced notifying MD of new or worsening symptoms of heart failure, weight gain of 3 pounds in a day, 5 in a week, shortness of breath or swelling.    [name], RN, [place] Management Coordinator  [phone]- Mobile [phone]- Toll Free Main Office

## 2016-08-24 DIAGNOSIS — I110 Hypertensive heart disease with heart failure: Secondary | ICD-10-CM | POA: Diagnosis not present

## 2016-08-24 DIAGNOSIS — F4323 Adjustment disorder with mixed anxiety and depressed mood: Secondary | ICD-10-CM | POA: Diagnosis not present

## 2016-08-24 DIAGNOSIS — J45909 Unspecified asthma, uncomplicated: Secondary | ICD-10-CM | POA: Diagnosis not present

## 2016-08-24 DIAGNOSIS — I5042 Chronic combined systolic (congestive) and diastolic (congestive) heart failure: Secondary | ICD-10-CM | POA: Diagnosis not present

## 2016-08-24 DIAGNOSIS — I429 Cardiomyopathy, unspecified: Secondary | ICD-10-CM | POA: Diagnosis not present

## 2016-08-24 DIAGNOSIS — I350 Nonrheumatic aortic (valve) stenosis: Secondary | ICD-10-CM | POA: Diagnosis not present

## 2016-08-24 DIAGNOSIS — M150 Primary generalized (osteo)arthritis: Secondary | ICD-10-CM | POA: Diagnosis not present

## 2016-08-24 DIAGNOSIS — I447 Left bundle-branch block, unspecified: Secondary | ICD-10-CM | POA: Diagnosis not present

## 2016-08-24 DIAGNOSIS — I252 Old myocardial infarction: Secondary | ICD-10-CM | POA: Diagnosis not present

## 2016-08-29 DIAGNOSIS — I10 Essential (primary) hypertension: Secondary | ICD-10-CM | POA: Diagnosis not present

## 2016-08-29 DIAGNOSIS — I2109 ST elevation (STEMI) myocardial infarction involving other coronary artery of anterior wall: Secondary | ICD-10-CM | POA: Diagnosis not present

## 2016-08-29 DIAGNOSIS — I25118 Atherosclerotic heart disease of native coronary artery with other forms of angina pectoris: Secondary | ICD-10-CM | POA: Diagnosis not present

## 2016-08-29 DIAGNOSIS — E782 Mixed hyperlipidemia: Secondary | ICD-10-CM | POA: Diagnosis not present

## 2016-08-29 DIAGNOSIS — I952 Hypotension due to drugs: Secondary | ICD-10-CM | POA: Diagnosis not present

## 2016-08-29 DIAGNOSIS — I5022 Chronic systolic (congestive) heart failure: Secondary | ICD-10-CM | POA: Diagnosis not present

## 2016-08-29 DIAGNOSIS — I2510 Atherosclerotic heart disease of native coronary artery without angina pectoris: Secondary | ICD-10-CM | POA: Diagnosis not present

## 2016-08-30 ENCOUNTER — Other Ambulatory Visit: Payer: Self-pay | Admitting: *Deleted

## 2016-08-30 DIAGNOSIS — I350 Nonrheumatic aortic (valve) stenosis: Secondary | ICD-10-CM | POA: Diagnosis not present

## 2016-08-30 DIAGNOSIS — J45909 Unspecified asthma, uncomplicated: Secondary | ICD-10-CM | POA: Diagnosis not present

## 2016-08-30 DIAGNOSIS — I252 Old myocardial infarction: Secondary | ICD-10-CM | POA: Diagnosis not present

## 2016-08-30 DIAGNOSIS — I447 Left bundle-branch block, unspecified: Secondary | ICD-10-CM | POA: Diagnosis not present

## 2016-08-30 DIAGNOSIS — I5042 Chronic combined systolic (congestive) and diastolic (congestive) heart failure: Secondary | ICD-10-CM | POA: Diagnosis not present

## 2016-08-30 DIAGNOSIS — I429 Cardiomyopathy, unspecified: Secondary | ICD-10-CM | POA: Diagnosis not present

## 2016-08-30 DIAGNOSIS — I110 Hypertensive heart disease with heart failure: Secondary | ICD-10-CM | POA: Diagnosis not present

## 2016-08-30 DIAGNOSIS — F4323 Adjustment disorder with mixed anxiety and depressed mood: Secondary | ICD-10-CM | POA: Diagnosis not present

## 2016-08-30 DIAGNOSIS — M150 Primary generalized (osteo)arthritis: Secondary | ICD-10-CM | POA: Diagnosis not present

## 2016-08-30 NOTE — Patient Outreach (Signed)
[facility]) Care Management  08/30/2016  MYLIE MCCURLEY [birth year] [id number]   Follow up telephone call to patient , spoke with her contact , daughter [name]. She discussed patient with episode yesterday of feeling cold, no fever,swelling in lower legs. Daughter arranged office visit at cardiology office yesterday, patient lasix dose has been increased to 80 mg for 3 days only, and patient has follow up visit in the next week.  Reinforced importance of adhering to sodium restriction of 2000 mg, daughter discussed she reads labels, for salt content and tries to limit her moms intact, she prepares foods such as soup to control salt. Reinforced limiting fluids to 2000 ml, daughter discussed patient likes to drink water all at one time like 8 oz, and when she does that she feels nauseated and vomits. Reinforced providing small amounts at a time to patient and through out the day, keeping the daily limits in mind. Daughter is working on reminding patient to keep legs elevated.  Patient family is working on getting scales, blood pressure cuff to have on hand when home health services goal met and tele monitoring equipment returned.   [facility] is [facility] that visited this am.  Patient had recent visit from [facility] on 8/24 and next visit 9/6/  Plan Placed call to [facility] spoke with Lesleigh Noe, verified patient enrolled in program 8/16. Final call to patient/caregiver on next week per request,and case closure. Family will notify MD of new or worsening of symptoms, of shortness of breath, weight gain or chest pain.    [name], RN, [place] Management Coordinator  [phone]- Mobile [phone]- Toll Free Main Office

## 2016-09-04 ENCOUNTER — Other Ambulatory Visit: Payer: Self-pay | Admitting: *Deleted

## 2016-09-04 ENCOUNTER — Encounter: Payer: Self-pay | Admitting: *Deleted

## 2016-09-04 NOTE — Patient Outreach (Signed)
[place] [facility]) Care Management  09/04/2016  Wanda Davenport May 18, 1923 [id number]   Placed follow up call to patient caregiver, daughter [name]. Daughter reports patient is doing okay, she gains a pound or 2 in a day then loses it the next day, she denies any increase in shortness of breath or swelling, patient continues to wear oxygen at .5 liters.  Daughter reports she continues to prepare low sodium meals and adhere to fluid restrictions. Daughter able to states symptoms of worsening Heart failure to notify MD of .    Daughter reports home health to  Re- evaluate this week for continued service and is aware of  [facility] involvement, [facility]  home visit on this week again.   Discussed case closure as [facility] managing care, daughter understands to notify with concerns, she has contact information .  Daughter denies any new concerns at this time, patient continues to weigh daily, family to get new scales for home this week  Daughter has [facility]  contact information if new concerns arise.    Plan Will close case ,verified [facility] managing care. Will send MD case closure letter, and notify CMA.   [name], RN, [place] Management Coordinator  [phone]- Mobile [phone]- Toll Free Main Office

## 2016-09-05 DIAGNOSIS — I2510 Atherosclerotic heart disease of native coronary artery without angina pectoris: Secondary | ICD-10-CM | POA: Diagnosis not present

## 2016-09-05 DIAGNOSIS — I5023 Acute on chronic systolic (congestive) heart failure: Secondary | ICD-10-CM | POA: Diagnosis not present

## 2016-09-05 DIAGNOSIS — R600 Localized edema: Secondary | ICD-10-CM | POA: Diagnosis not present

## 2016-09-21 DIAGNOSIS — I5043 Acute on chronic combined systolic (congestive) and diastolic (congestive) heart failure: Secondary | ICD-10-CM | POA: Diagnosis not present

## 2016-09-21 DIAGNOSIS — R0602 Shortness of breath: Secondary | ICD-10-CM | POA: Diagnosis not present

## 2016-09-21 DIAGNOSIS — I2699 Other pulmonary embolism without acute cor pulmonale: Secondary | ICD-10-CM | POA: Diagnosis not present

## 2016-09-26 ENCOUNTER — Telehealth: Payer: Self-pay

## 2016-09-26 NOTE — Telephone Encounter (Signed)
Traci from [facility] called and wanted to inform Dr. Raliegh Scarlet of some concerns with Wanda Davenport.  Patient is on 1/2 liter of oxygen for COPD. Per Traci patient is having anxiety issues that are causing increased shortness of breath that is worse at night.  Patient is on Lexapro 10 mg daily but she is not sure if the patient is taking this every day.  Traci states that the patient's O2 levels are in the 90s and are stable but patient is unable to calm down.  They are wanting to know if there is anything that can be given to help with her anxiety levels.  Patient's daughter lives with her but is going out of town Friday 09/28/16 for 2 weeks, patient will be home alone.  Please advise.  MPulliam, CMA/RT(R)

## 2016-09-26 NOTE — Telephone Encounter (Signed)
-   They need to make sure patient is taking the Lexapro daily. That's what she is taking this medicine for.  If she is not taking it, her symptoms will not be controlled  - Also due to patient's age and tenuous status a do not recommend anything additional to "calm her down."  - If there are additional concerns, please have the patient make a f/up OV with [place] so she can be properly assessed

## 2016-09-27 NOTE — Telephone Encounter (Signed)
Called Traci back left message to call the office.  MPulliam, CMA/RT(R)

## 2016-10-01 NOTE — Telephone Encounter (Signed)
Spoke with Linus Orn and informed her of Dr. Hershal Coria recommendations.  Traci states that she will advise Ms. Gavia to call to schedule an appt.  Charyl Bigger, CMA

## 2016-10-01 NOTE — Telephone Encounter (Signed)
LVM for Traci to call to discuss.  Charyl Bigger, CMA

## 2016-10-03 DIAGNOSIS — I2510 Atherosclerotic heart disease of native coronary artery without angina pectoris: Secondary | ICD-10-CM | POA: Diagnosis not present

## 2016-10-03 DIAGNOSIS — R600 Localized edema: Secondary | ICD-10-CM | POA: Diagnosis not present

## 2016-10-03 DIAGNOSIS — I5022 Chronic systolic (congestive) heart failure: Secondary | ICD-10-CM | POA: Diagnosis not present

## 2016-10-04 ENCOUNTER — Other Ambulatory Visit: Payer: Self-pay | Admitting: Family Medicine

## 2016-10-04 DIAGNOSIS — I2699 Other pulmonary embolism without acute cor pulmonale: Secondary | ICD-10-CM

## 2016-10-04 DIAGNOSIS — I447 Left bundle-branch block, unspecified: Secondary | ICD-10-CM

## 2016-10-04 DIAGNOSIS — I5022 Chronic systolic (congestive) heart failure: Secondary | ICD-10-CM

## 2016-10-04 DIAGNOSIS — I200 Unstable angina: Secondary | ICD-10-CM

## 2016-10-04 NOTE — Telephone Encounter (Signed)
Advised pt that she needs a f/u visit with Dr. Raliegh Scarlet since she has not seen her chronic problem f/u in quite some time.  Advised pt that refill for bisoprolol only authorized for 30 days.  Pt states that she will have her daughter in law call this afternoon for an appt.  Charyl Bigger, CMA

## 2016-10-15 ENCOUNTER — Encounter: Payer: Self-pay | Admitting: Family Medicine

## 2016-10-15 ENCOUNTER — Ambulatory Visit (INDEPENDENT_AMBULATORY_CARE_PROVIDER_SITE_OTHER): Payer: Medicare HMO | Admitting: Family Medicine

## 2016-10-15 DIAGNOSIS — F419 Anxiety disorder, unspecified: Secondary | ICD-10-CM | POA: Diagnosis not present

## 2016-10-15 DIAGNOSIS — I5022 Chronic systolic (congestive) heart failure: Secondary | ICD-10-CM

## 2016-10-15 DIAGNOSIS — I2699 Other pulmonary embolism without acute cor pulmonale: Secondary | ICD-10-CM

## 2016-10-15 DIAGNOSIS — I200 Unstable angina: Secondary | ICD-10-CM | POA: Diagnosis not present

## 2016-10-15 DIAGNOSIS — I447 Left bundle-branch block, unspecified: Secondary | ICD-10-CM

## 2016-10-15 MED ORDER — ESCITALOPRAM OXALATE 10 MG PO TABS: 10.0000 mg | ORAL_TABLET | Freq: Two times a day (BID) | ORAL | 1 refills | Status: AC

## 2016-10-15 MED ORDER — BISOPROLOL FUMARATE 10 MG PO TABS: 5.0000 mg | ORAL_TABLET | Freq: Every day | ORAL | 0 refills | Status: DC

## 2016-10-15 NOTE — Progress Notes (Signed)
Impression and Recommendations:    1. Anxiety   2. chronic LBBB (left bundle branch block)- since 2012   3. Unstable angina ([place])   4. PE (pulmonary thromboembolism) ([place])   5. Chronic systolic heart failure ([facility])    -Increase Lexapro today-10 mg twice daily instead of daily.  I recommended going to a counselor which patient refuses.  Also recommend she get up and move around more.  -Discussed stress management with her daughter-in-law who is trying to improve the relationship to pain patient and her daughter.  - Talked to patient that bisoprolol should come from Dr. Nehemiah Massed her cardiologist.  Last refill was given for 30 tablets from [place] but apparently per daughter-in-law, it was cut in half by cardiology.  We where not immediately made aware of this change, hence its in pt's best interest for that meds to come from cards in future  - For [place], we will manage your Lexapro, Singulair, Flonase   Gross side effects, risk and benefits, and alternatives of medications and treatment plan in general discussed with patient.  Patient is aware that all medications have potential side effects and we are unable to predict every side effect or drug-drug interaction that may occur.   Patient will call with any questions prior to using medication if they have concerns.  Expresses verbal understanding and consents to current therapy and treatment regimen.  No barriers to understanding were identified.  Red flag symptoms and signs discussed in detail.  Patient expressed understanding regarding what to do in case of emergency\urgent symptoms  Please see AVS handed out to patient at the end of our visit for further patient instructions/ counseling done pertaining to today's office visit.   Return in about 8 weeks (around 12/10/2016) for Follow-up after increase Lexapro dose.     Note: This document was prepared using Dragon voice recognition software and may include unintentional dictation  errors.  Leafy Motsinger 7:23 AM --------------------------------------------------------------------------------------------------------------------------------------------------------------------------------------------------------------------------------------------    Subjective:    CC:  Chief Complaint  Patient presents with  . Follow-up    HPI: Wanda Davenport is a 83 y.o. female who presents to [place] at [facility] today for follow-up of mood.   - needs to calm her nerves more.  Thinks the lexapro needs to be stronger. Feels anxious, uneasy.  Discourse bwtn her and daughter who moved away.  NOW here with duaghter in law.  Pt tearful and angry with duashgter yet regretful as well.   Emotionally having hard time with it.  Denies si/hi.  Long discussion with pt about things upsetting her that transpired btwn the two of them. Tearful.     Depression screen [facility] 2/9 10/16/2016 08/07/2016 07/18/2016  Decreased Interest 0 0 0  Down, Depressed, Hopeless 0 1 0  PHQ - 2 Score 0 1 0  Altered sleeping 0 - -  Tired, decreased energy 3 - -  Change in appetite 1 - -  Feeling bad or failure about yourself  0 - -  Trouble concentrating 0 - -  Moving slowly or fidgety/restless 0 - -  Suicidal thoughts 0 - -  PHQ-9 Score 4 - -  Difficult doing work/chores Somewhat difficult - -     GAD 7 : Generalized Anxiety Score 10/16/2016 05/22/2016  Nervous, Anxious, on Edge 3 1  Control/stop worrying 3 0  Worry too much - different things 3 1  Trouble relaxing 3 0  Restless 3 0  Easily annoyed or  irritable 3 0  Afraid - awful might happen 1 0  Total GAD 7 Score 19 2  Anxiety Difficulty Somewhat difficult Not difficult at all      No problems updated.   Wt Readings from Last 3 Encounters:  10/15/16 157 lb (71.2 kg)  08/14/16 168 lb 12 oz (76.5 kg)  08/07/16 168 lb (76.2 kg)   BP Readings from Last 3 Encounters:  10/15/16 102/71  08/14/16 104/67  08/07/16 120/68    Pulse Readings from Last 3 Encounters:  10/15/16 83  08/14/16 71  08/07/16 66   BMI Readings from Last 3 Encounters:  10/15/16 29.66 kg/m  08/14/16 31.89 kg/m  08/07/16 31.74 kg/m     Patient Care Team    Relationship Specialty Notifications Start End  Mellody Dance, DO PCP - General Family Medicine  10/24/15   Christene Slates, MD  Dermatology  10/24/15   Leandrew Koyanagi, MD Referring Physician Ophthalmology  11/06/15   Deboraha Sprang, MD Consulting Physician Cardiology  11/06/15   Gatha Mayer, MD Consulting Physician Gastroenterology  11/06/15    Comment: she used to see Delfin Edis, MD  Corey Skains, MD Consulting Physician Cardiology  05/06/16    Comment: CHF specialist  Alisa Graff, [place] Nurse Practitioner Family Medicine  06/15/16    Comment: [facility]     Patient Active Problem List   Diagnosis Date Noted  . Medication monitoring encounter 01/26/2016    Priority: High  . Intolerance of drug- statins 01/25/2016    Priority: High  . Hypertriglyceridemia 12/13/2015    Priority: High  . Low serum HDL 12/13/2015    Priority: High  . Adjustment disorder with mixed anxiety and depressed mood 11/06/2015    Priority: High  . Hyperlipidemia     Priority: High  . Pre-diabetes     Priority: High  . HTN (hypertension) 04/26/2007    Priority: High  . Claudication of left lower extremity ([place]) 12/13/2015    Priority: Medium  . Basal cell carcinoma 10/31/2015    Priority: Medium  . GAD (generalized anxiety disorder) 10/13/2015    Priority: Medium  . chronic LBBB (left bundle branch block)- since 2012     Priority: Medium  . B12 deficiency 12/13/2015    Priority: Low  . Vitamin D deficiency 11/06/2015    Priority: Low  . Environmental and seasonal allergies 10/31/2015    Priority: Low  . Reactive airway disease- only spring and fall due to allergies 10/31/2015    Priority: Low  . Generalized OA     Priority: Low  . Allergic  rhinitis     Priority: Low  . Bilateral leg edema 09/05/2016  . Sinusitis, acute maxillary 07/12/2016  . Cough 07/12/2016  . Shortness of breath 07/12/2016  . Pleural effusion, left 07/12/2016  . Chronic systolic heart failure ([place]) 06/15/2016  . Acute MI anterior wall subsequent episode care ([place]) 06/04/2016  . PE (pulmonary thromboembolism) ([place]) 05/19/2016  . Major depression in remission ([place]) 05/07/2016  . Pneumonia, community acquired 05/07/2016  . Unstable angina ([place])   . Coronary artery disease of native artery of native heart with stable angina pectoris ([place]) 05/01/2016  . STEMI (ST elevation myocardial infarction) ([place]) 04/26/2016  . Knee pain, bilateral 04/10/2016  . Counseling regarding end of life decision making 01/25/2016  . Atypical chest pain 04/16/2011  . DIVERTICULOSIS, COLON 12/03/2007    Past Medical history, Surgical history, Family history, Social history, Allergies and Medications have been  entered into the medical record, reviewed and changed as needed.    Current Meds  Medication Sig  . albuterol (PROVENTIL HFA;VENTOLIN HFA) 108 (90 BASE) MCG/ACT inhaler Inhale 2 puffs into the lungs every 6 (six) hours as needed. Wheezing or shortness of breath  . apixaban (ELIQUIS) 5 MG TABS tablet Take 10 mg (2 tabs) twice a day till 05/22/16 and then from 05/23/16 take 1 tab (5 mg ) two times a day  . aspirin 325 MG EC tablet Take 325 mg by mouth daily.  . bisoprolol (ZEBETA) 10 MG tablet Take 0.5 tablets (5 mg total) by mouth daily.  Marland Kitchen escitalopram (LEXAPRO) 10 MG tablet Take 1 tablet (10 mg total) by mouth 2 (two) times daily.  . fluticasone (FLONASE) 50 MCG/ACT nasal spray Place 2 sprays into the nose daily. Nasal congestion Rarely used   . furosemide (LASIX) 40 MG tablet Take 1.5 tablets by mouth daily.  . isosorbide mononitrate (IMDUR) 30 MG 24 hr tablet Take 1 tablet (30 mg total) by mouth daily.  . montelukast (SINGULAIR) 10 MG tablet Take 1 tablet (10 mg total)  by mouth at bedtime.  . nitroGLYCERIN (NITROSTAT) 0.3 MG SL tablet Place 0.3 mg under the tongue every 5 (five) minutes as needed for chest pain.  . [DISCONTINUED] bisoprolol (ZEBETA) 10 MG tablet TAKE 1 TABLET BY MOUTH EVERY DAY  . [DISCONTINUED] escitalopram (LEXAPRO) 10 MG tablet TAKE 1 TABLET (10 MG TOTAL) BY MOUTH DAILY.    Allergies:  Allergies  Allergen Reactions  . Fenofibrate Other (See Comments)    Myalgias/gi upset   . Statins Other (See Comments)    Myalgias   . Sulfonamide Derivatives Nausea And Vomiting    REACTION: nausea     Review of Systems: Review of Systems: General:   No F/C, wt loss Pulm:   No DIB, SOB, pleuritic chest pain Card:  No CP, palpitations Abd:  No n/v/d or pain Ext:  No inc edema from baseline Psych: no SI/ HI    Objective:   Blood pressure 102/71, pulse 83, height 5\' 1"  (1.549 m), weight 157 lb (71.2 kg). Body mass index is 29.66 kg/m. General:  Well Developed, well nourished, appropriate for stated age.  Neuro:  Alert and oriented,  extra-ocular muscles intact  HEENT:  Normocephalic, atraumatic, neck supple, no carotid bruits appreciated  Skin:  no gross rash, warm, pink. Cardiac:  RRR, S1 S2 Respiratory:  ECTA B/L and A/P, Not using accessory muscles, speaking in full sentences- unlabored. Vascular:  Ext warm, no cyanosis apprec.; cap RF less 2 sec. Psych:  No HI/SI, judgement and insight good, Euthymic mood. Full Affect.

## 2016-10-15 NOTE — Patient Instructions (Addendum)
-   Talked to patient that bisoprolol should come from Dr. Nehemiah Massed her cardiologist.  Last refill was given for 30 tablets from [place] but apparently per daughter-in-law, it was cut in half by cardiology   - For [place], we will manage your Lexapro, Singulair, Flonase

## 2016-10-18 ENCOUNTER — Other Ambulatory Visit: Payer: Self-pay

## 2016-10-18 DIAGNOSIS — Z23 Encounter for immunization: Secondary | ICD-10-CM

## 2016-10-19 ENCOUNTER — Ambulatory Visit (INDEPENDENT_AMBULATORY_CARE_PROVIDER_SITE_OTHER): Payer: Medicare HMO

## 2016-10-19 DIAGNOSIS — Z23 Encounter for immunization: Secondary | ICD-10-CM | POA: Diagnosis not present

## 2016-10-19 NOTE — Progress Notes (Signed)
Pt here for influenza vaccine.  Screening questionnaire reviewed, VIS provided to patient, and any/all patient questions answered.  Injection was given "curbside" because pt has difficulty walking and is on oxygen therapy.  Therefore, unable to obtain vitals, but pt denies any fever or sickness within the last few days. Charyl Bigger, CMA

## 2016-10-21 DIAGNOSIS — I2699 Other pulmonary embolism without acute cor pulmonale: Secondary | ICD-10-CM | POA: Diagnosis not present

## 2016-10-21 DIAGNOSIS — R0602 Shortness of breath: Secondary | ICD-10-CM | POA: Diagnosis not present

## 2016-10-21 DIAGNOSIS — I5043 Acute on chronic combined systolic (congestive) and diastolic (congestive) heart failure: Secondary | ICD-10-CM | POA: Diagnosis not present

## 2016-11-03 ENCOUNTER — Other Ambulatory Visit: Payer: Self-pay | Admitting: Family Medicine

## 2016-11-03 DIAGNOSIS — I5022 Chronic systolic (congestive) heart failure: Secondary | ICD-10-CM

## 2016-11-03 DIAGNOSIS — I2699 Other pulmonary embolism without acute cor pulmonale: Secondary | ICD-10-CM

## 2016-11-03 DIAGNOSIS — I200 Unstable angina: Secondary | ICD-10-CM

## 2016-11-03 DIAGNOSIS — I447 Left bundle-branch block, unspecified: Secondary | ICD-10-CM

## 2016-11-21 DIAGNOSIS — I2699 Other pulmonary embolism without acute cor pulmonale: Secondary | ICD-10-CM | POA: Diagnosis not present

## 2016-11-21 DIAGNOSIS — I5043 Acute on chronic combined systolic (congestive) and diastolic (congestive) heart failure: Secondary | ICD-10-CM | POA: Diagnosis not present

## 2016-11-21 DIAGNOSIS — R0602 Shortness of breath: Secondary | ICD-10-CM | POA: Diagnosis not present

## 2016-12-12 ENCOUNTER — Ambulatory Visit: Payer: Medicare HMO | Admitting: Family Medicine

## 2016-12-13 ENCOUNTER — Ambulatory Visit: Payer: Medicare HMO | Admitting: Family Medicine

## 2016-12-13 DIAGNOSIS — I2109 ST elevation (STEMI) myocardial infarction involving other coronary artery of anterior wall: Secondary | ICD-10-CM | POA: Diagnosis not present

## 2016-12-13 DIAGNOSIS — E782 Mixed hyperlipidemia: Secondary | ICD-10-CM | POA: Diagnosis not present

## 2016-12-13 DIAGNOSIS — I10 Essential (primary) hypertension: Secondary | ICD-10-CM | POA: Diagnosis not present

## 2016-12-13 DIAGNOSIS — I2510 Atherosclerotic heart disease of native coronary artery without angina pectoris: Secondary | ICD-10-CM | POA: Diagnosis not present

## 2016-12-13 DIAGNOSIS — I25118 Atherosclerotic heart disease of native coronary artery with other forms of angina pectoris: Secondary | ICD-10-CM | POA: Diagnosis not present

## 2016-12-13 DIAGNOSIS — I5023 Acute on chronic systolic (congestive) heart failure: Secondary | ICD-10-CM | POA: Diagnosis not present

## 2016-12-14 ENCOUNTER — Other Ambulatory Visit: Payer: Self-pay | Admitting: Family Medicine

## 2016-12-14 DIAGNOSIS — F419 Anxiety disorder, unspecified: Secondary | ICD-10-CM

## 2016-12-18 ENCOUNTER — Emergency Department
Admission: EM | Admit: 2016-12-18 | Discharge: 2016-12-18 | Disposition: A | Discharge status: Home / Self Care | Payer: Medicare HMO | Attending: Emergency Medicine | Admitting: Emergency Medicine

## 2016-12-18 ENCOUNTER — Emergency Department: Payer: Medicare HMO

## 2016-12-18 ENCOUNTER — Encounter: Payer: Self-pay | Admitting: Emergency Medicine

## 2016-12-18 ENCOUNTER — Other Ambulatory Visit: Payer: Self-pay

## 2016-12-18 DIAGNOSIS — Z87891 Personal history of nicotine dependence: Secondary | ICD-10-CM | POA: Insufficient documentation

## 2016-12-18 DIAGNOSIS — M79604 Pain in right leg: Secondary | ICD-10-CM

## 2016-12-18 DIAGNOSIS — I5022 Chronic systolic (congestive) heart failure: Secondary | ICD-10-CM | POA: Insufficient documentation

## 2016-12-18 DIAGNOSIS — I252 Old myocardial infarction: Secondary | ICD-10-CM | POA: Diagnosis not present

## 2016-12-18 DIAGNOSIS — Z7982 Long term (current) use of aspirin: Secondary | ICD-10-CM | POA: Insufficient documentation

## 2016-12-18 DIAGNOSIS — Z7901 Long term (current) use of anticoagulants: Secondary | ICD-10-CM | POA: Insufficient documentation

## 2016-12-18 DIAGNOSIS — Z79899 Other long term (current) drug therapy: Secondary | ICD-10-CM | POA: Insufficient documentation

## 2016-12-18 DIAGNOSIS — M79671 Pain in right foot: Secondary | ICD-10-CM | POA: Diagnosis present

## 2016-12-18 DIAGNOSIS — M7989 Other specified soft tissue disorders: Secondary | ICD-10-CM | POA: Diagnosis not present

## 2016-12-18 DIAGNOSIS — I2510 Atherosclerotic heart disease of native coronary artery without angina pectoris: Secondary | ICD-10-CM | POA: Insufficient documentation

## 2016-12-18 DIAGNOSIS — I110 Hypertensive heart disease with heart failure: Secondary | ICD-10-CM | POA: Diagnosis not present

## 2016-12-18 DIAGNOSIS — Z85828 Personal history of other malignant neoplasm of skin: Secondary | ICD-10-CM | POA: Insufficient documentation

## 2016-12-18 NOTE — Discharge Instructions (Signed)
Your ultrasound of the leg does not show any new blood clots. It does show a cyst behind the right knee which may be contributing to your symptoms. Follow-up with cardiology to reassess your fluid pill regimen. Until then, take a second dose of your torsemide today, Thursday, and Saturday. Ultrasound summary is below.  Sonographic survey negative for DVT of the common femoral, femoral,  popliteal veins.     Re- demonstration of thrombus of right peroneal vein, unchanged  since study in May 2018.     Right-sided Baker's cyst.

## 2016-12-18 NOTE — ED Provider Notes (Addendum)
[facility] Emergency Department Provider Note  ____________________________________________  Time seen: Approximately 11:17 AM  I have reviewed the triage vital signs and the nursing notes.   HISTORY  Chief Complaint Foot Pain  Level 5 Caveat: Portions of the History and Physical are unable to be obtained due to patient being a poor historian   HPI Wanda Davenport is a 83 y.o. female who complains of right foot pain and swelling for 2 days. 4 days ago she decreased her torsemide from twice a day to once daily at the recommendation of her cardiology clinic that she saw 5 days ago. No new worsening symptoms otherwise. She has chronic orthopnea, unchanged. No exertional chest pain or shortness of breath beyond baseline. No resting chest pain or shortness of breath. Stable on her chronic home oxygen.  Reviewing the electronic medical record and the cardiology note, it seems to cardiology had intended for her to discontinue her isosorbide previously. However, the patient is still taking it. Concern was for hypotension. Blood pressure today is 110/60.  Family report only new concern is that she's gained 1 pound in the past 4 days since the decrease in her torsemide. She is on Eliquis for prior DVT.   Past Medical History:  Diagnosis Date  . Allergic rhinitis   . Anxiety   . Arthritis   . Atypical chest pain   . Bronchitis 01/14/2016  . Chronic diastolic CHF (congestive heart failure) (HCC)    Echo 06/2010 EF 60-65%, no RWMAs, grade 1 diastolic dysfunction, mild LAE, PASP 73mmHg.  Marland Kitchen Coronary artery disease    MI  . DIVERTICULOSIS, COLON   . HEMORRHOIDS, INTERNAL   . Hepatic steatosis   . Hyperlipidemia   . HYPERTENSION   . LBBB (left bundle branch block)    a. present on ECG 06/2010  . Osteopenia   . Pre-diabetes    A1c 6.0     Patient Active Problem List   Diagnosis Date Noted  . Bilateral leg edema 09/05/2016  . Sinusitis, acute maxillary 07/12/2016   . Cough 07/12/2016  . Shortness of breath 07/12/2016  . Pleural effusion, left 07/12/2016  . Chronic systolic heart failure ([place]) 06/15/2016  . Acute MI anterior wall subsequent episode care ([place]) 06/04/2016  . PE (pulmonary thromboembolism) ([place]) 05/19/2016  . Major depression in remission ([place]) 05/07/2016  . Pneumonia, community acquired 05/07/2016  . Unstable angina ([place])   . Coronary artery disease of native artery of native heart with stable angina pectoris ([place]) 05/01/2016  . STEMI (ST elevation myocardial infarction) ([place]) 04/26/2016  . Knee pain, bilateral 04/10/2016  . Medication monitoring encounter 01/26/2016  . Intolerance of drug- statins 01/25/2016  . Counseling regarding end of life decision making 01/25/2016  . Hypertriglyceridemia 12/13/2015  . Low serum HDL 12/13/2015  . B12 deficiency 12/13/2015  . Claudication of left lower extremity ([place]) 12/13/2015  . Vitamin D deficiency 11/06/2015  . Adjustment disorder with mixed anxiety and depressed mood 11/06/2015  . Basal cell carcinoma 10/31/2015  . Environmental and seasonal allergies 10/31/2015  . Reactive airway disease- only spring and fall due to allergies 10/31/2015  . GAD (generalized anxiety disorder) 10/13/2015  . Atypical chest pain 04/16/2011  . Hyperlipidemia   . Pre-diabetes   . chronic LBBB (left bundle branch block)- since 2012   . Generalized OA   . Allergic rhinitis   . DIVERTICULOSIS, COLON 12/03/2007  . HTN (hypertension) 04/26/2007     Past Surgical History:  Procedure Laterality Date  .  ABDOMINAL HYSTERECTOMY    . BREAST SURGERY    . CATARACT EXTRACTION W/PHACO Left 05/12/2014   Procedure: CATARACT EXTRACTION PHACO AND INTRAOCULAR LENS PLACEMENT (IOC);  Surgeon: Leandrew Koyanagi, MD;  Location: [place] [facility];  Service: Ophthalmology;  Laterality: Left;  . COLONOSCOPY     a. 2010  . CORONARY STENT INTERVENTION N/A 04/26/2016   Procedure: Coronary Stent Intervention;  Surgeon:  Isaias Cowman, MD;  Location: [place] [facility];  Service: Cardiovascular;  Laterality: N/A;  . LEFT HEART CATH AND CORONARY ANGIOGRAPHY N/A 04/26/2016   Procedure: Left Heart Cath and Coronary Angiography;  Surgeon: Isaias Cowman, MD;  Location: [place] [facility];  Service: Cardiovascular;  Laterality: N/A;  . LEFT HEART CATH AND CORONARY ANGIOGRAPHY N/A 05/05/2016   Procedure: Left Heart Cath and Coronary Angiography;  Surgeon: Wellington Hampshire, MD;  Location: [place] [facility];  Service: Cardiovascular;  Laterality: N/A;  . TONSILLECTOMY       Prior to Admission medications   Medication Sig Start Date End Date Taking? Authorizing Provider  albuterol (PROVENTIL HFA;VENTOLIN HFA) 108 (90 BASE) MCG/ACT inhaler Inhale 2 puffs into the lungs every 6 (six) hours as needed. Wheezing or shortness of breath    [provider]  apixaban (ELIQUIS) 5 MG TABS tablet Take 10 mg (2 tabs) twice a day till 05/22/16 and then from 05/23/16 take 1 tab (5 mg ) two times a day 05/17/16   Fritzi Mandes, MD  aspirin 325 MG EC tablet Take 325 mg by mouth daily.    [provider]  bisoprolol (ZEBETA) 10 MG tablet Take 0.5 tablets (5 mg total) by mouth daily. 10/15/16 10/31/16  Opalski, Deborah, DO  escitalopram (LEXAPRO) 10 MG tablet Take 1 tablet (10 mg total) by mouth 2 (two) times daily. 10/15/16   Opalski, Deborah, DO  fluticasone (FLONASE) 50 MCG/ACT nasal spray Place 2 sprays into the nose daily. Nasal congestion Rarely used     [provider]  furosemide (LASIX) 40 MG tablet Take 1.5 tablets by mouth daily. 07/23/16   [provider]  isosorbide mononitrate (IMDUR) 30 MG 24 hr tablet Take 1 tablet (30 mg total) by mouth daily. 04/30/16   Fritzi Mandes, MD  montelukast (SINGULAIR) 10 MG tablet Take 1 tablet (10 mg total) by mouth at bedtime. 08/14/16   Danford, Valetta Fuller D, NP  nitroGLYCERIN (NITROSTAT) 0.3 MG SL tablet Place 0.3 mg under the tongue every 5 (five)  minutes as needed for chest pain.    [provider]     Allergies Fenofibrate; Statins; and Sulfonamide derivatives   Family History  Problem Relation Age of Onset  . Colon cancer Sister   . Melanoma Sister   . Hyperlipidemia Brother   . Hypertension Mother   . Lupus Mother        died 98 from surgical complications  . Alzheimer's disease Brother        unknown  . Kidney disease Father        died 61  . Hypertension Father   . Alcohol abuse Father     Social History Social History   Tobacco Use  . Smoking status: Former Smoker    Packs/day: 0.25    Years: 20.00    Pack years: 5.00    Types: Cigarettes    Last attempt to quit: 01/02/1971    Years since quitting: 45.9  . Smokeless tobacco: Never Used  . Tobacco comment: smoked a few cigarettes/day x 20 yrs - quit @  age 6.  Substance Use Topics  . Alcohol use: No  . Drug use: No    Review of Systems  Constitutional:   No fever or chills.  ENT:   No sore throat. No rhinorrhea. Cardiovascular:   No chest pain or syncope. Respiratory:   Positive chronic shortness of breath without cough. Gastrointestinal:   Negative for abdominal pain, vomiting and diarrhea.  Musculoskeletal:   Positive right foot pain and swelling All other systems reviewed and are negative except as documented above in ROS and HPI.  ____________________________________________   PHYSICAL EXAM:  VITAL SIGNS: ED Triage Vitals  Enc Vitals Group     BP 12/18/16 0923 (!) 109/58     Pulse Rate 12/18/16 0923 68     Resp 12/18/16 0923 16     Temp 12/18/16 0923 98 F (36.7 C)     Temp Source 12/18/16 0923 Oral     SpO2 12/18/16 0923 100 %     Weight 12/18/16 0925 159 lb (72.1 kg)     Height 12/18/16 0925 5\' 4"  (1.626 m)     Head Circumference --      Peak Flow --      Pain Score 12/18/16 0933 5     Pain Loc --      Pain Edu? --      Excl. in [place]? --     Vital signs reviewed, nursing assessments reviewed.   Constitutional:    Alert and oriented. Chronically ill-appearing, not in distress. Eyes:   No scleral icterus.  EOMI. No nystagmus. No conjunctival pallor. PERRL. ENT   Head:   Normocephalic and atraumatic.   Nose:   No congestion/rhinnorhea.    Mouth/Throat:   MMM, no pharyngeal erythema. No peritonsillar mass.    Neck:   No meningismus. Full ROM. Hematological/Lymphatic/Immunilogical:   No cervical lymphadenopathy. Cardiovascular:   RRR. Symmetric bilateral radial and DP pulses.  No murmurs.  Respiratory:   Normal respiratory effort without tachypnea/retractions. Breath sounds are clear and equal bilaterally. No wheezes/rales/rhonchi. Gastrointestinal:   Soft and nontender. Non distended. There is no CVA tenderness.  No rebound, rigidity, or guarding. Genitourinary:   deferred Musculoskeletal:   Normal range of motion in all extremities. No joint effusions.  No lower extremity tenderness.  Trace pedal edema on the right, none on the left. Nontender. Faint erythema diffusely over the right mid foot dorsally without induration, warmth, or tenderness or other inflammatory changes. There is a nontender fluid filled blister or bursa over the right first MTP joint. Tophaceous changes on the second toe. Neurologic:   Normal speech and language.  Motor grossly intact. No gross focal neurologic deficits are appreciated.  Skin:    Skin is warm, dry and intact. Erythema and gout stigmata as above on the right foot.  ____________________________________________    LABS (pertinent positives/negatives) (all labs ordered are listed, but only abnormal results are displayed) Labs Reviewed - No data to display ____________________________________________   EKG Interpreted by me. Sinus rhythm, rate 61. Left axis, lbbb. No acute ischemic changes ____________________________________________    RADIOLOGY  US Venous Img Lower Unilateral Right  Result Date: 12/18/2016 CLINICAL DATA:  83 year old female  with right leg swelling EXAM: RIGHT LOWER EXTREMITY VENOUS DOPPLER ULTRASOUND TECHNIQUE: Gray-scale sonography with graded compression, as well as color Doppler and duplex ultrasound were performed to evaluate the lower extremity deep venous systems from the level of the common femoral vein and including the common femoral, femoral, profunda femoral, popliteal and calf veins  including the posterior tibial, peroneal and gastrocnemius veins when visible. The superficial great saphenous vein was also interrogated. Spectral Doppler was utilized to evaluate flow at rest and with distal augmentation maneuvers in the common femoral, femoral and popliteal veins. COMPARISON:  05/15/2016 FINDINGS: Contralateral Common Femoral Vein: Respiratory phasicity is normal and symmetric with the symptomatic side. No evidence of thrombus. Normal compressibility. Common Femoral Vein: No evidence of thrombus. Normal compressibility, respiratory phasicity and response to augmentation. Saphenofemoral Junction: No evidence of thrombus. Normal compressibility and flow on color Doppler imaging. Profunda Femoral Vein: No evidence of thrombus. Normal compressibility and flow on color Doppler imaging. Femoral Vein: No evidence of thrombus. Normal compressibility, respiratory phasicity and response to augmentation. Popliteal Vein: No evidence of thrombus. Normal compressibility, respiratory phasicity and response to augmentation. Calf Veins: Re- demonstration of thrombus of right peroneal vein without proximal extension. Superficial Great Saphenous Vein: No evidence of thrombus. Normal compressibility and flow on color Doppler imaging. Other Findings: Heterogeneous fluid collection the right popliteal fossa measures 5.6 cm x 1.1 cm x 2.1 cm. IMPRESSION: Sonographic survey negative for DVT of the common femoral, femoral, popliteal veins. Re- demonstration of thrombus of right peroneal vein, unchanged since study in May 2018. Right-sided Baker's  cyst. Electronically Signed   By: Corrie Mckusick D.O.   On: 12/18/2016 11:31    ____________________________________________   PROCEDURES Procedures  ____________________________________________     CLINICAL IMPRESSION / ASSESSMENT AND PLAN / ED COURSE  Pertinent labs & imaging results that were available during my care of the patient were reviewed by me and considered in my medical decision making (see chart for details).   Patient overall well-appearing, not in distress, at apparent chronic baseline except for a 1 pound weight gain since decreasing her diuretic. Low suspicion of DVT especially with her Eliquis use, but given the history of DVT and CHF, we'll get an ultrasound of the leg. If negative for DVT having the patient is suitable for outpatient follow-up with her cardiology or heart failure clinic. In the meantime I'll have them increase the torsemide to taking a second dose every other day. They use a pillbox and so this is easy for them to manage. The family does manage the patient's medications for her. I'll have her go ahead and take her oral medications here while waiting for the ultrasound to be completed. I informed that cardiology plan to discontinue the isosorbide so they will discontinue that for now. Findings on the right foot may be related to some effects of gout which is a strong family history for, the patient is not having an acute severely painful flare. She is stable for outpatient follow-up at this time.  Low suspicion of ACS PE dissection or acute CHF decompensation or pulmonary edema.   ----------------------------------------- 12:02 PM on 12/18/2016 -----------------------------------------  Ultrasound negative, does show a right Baker's cyst, stable DVT as previously identified. I'll have the patient take her torsemide daily with an extra dose on Tuesday Thursday Saturday until she sees her cardiology clinic. They understand that they should discontinue  the isosorbide in the meantime as well.     ____________________________________________   FINAL CLINICAL IMPRESSION(S) / ED DIAGNOSES    Final diagnoses:  Chronic systolic congestive heart failure (HCC)  Right leg pain      This SmartLink is deprecated. Use AVSMEDLIST instead to display the medication list for a patient.   Portions of this note were generated with dragon dictation software. Dictation errors may occur despite best attempts  at proofreading.    Carrie Mew, MD 12/18/16 1203    Carrie Mew, MD 12/18/16 1239

## 2016-12-18 NOTE — ED Triage Notes (Signed)
Pt with right foot pain and swelling. Pt takes a diuretic that MD recently cut in half and now pt is complaining of increased swelling and pain. Pt also c/o toe # 2 right pain. Pt able to stand and has equal strength bilaterally.

## 2016-12-18 NOTE — ED Notes (Signed)
Patient transported to Ultrasound 

## 2016-12-21 DIAGNOSIS — I5043 Acute on chronic combined systolic (congestive) and diastolic (congestive) heart failure: Secondary | ICD-10-CM | POA: Diagnosis not present

## 2016-12-21 DIAGNOSIS — R0602 Shortness of breath: Secondary | ICD-10-CM | POA: Diagnosis not present

## 2016-12-21 DIAGNOSIS — I2699 Other pulmonary embolism without acute cor pulmonale: Secondary | ICD-10-CM | POA: Diagnosis not present

## 2017-01-09 ENCOUNTER — Inpatient Hospital Stay: Payer: Medicare HMO | Admitting: Family Medicine

## 2017-01-10 DIAGNOSIS — I2510 Atherosclerotic heart disease of native coronary artery without angina pectoris: Secondary | ICD-10-CM | POA: Diagnosis not present

## 2017-01-10 DIAGNOSIS — I2782 Chronic pulmonary embolism: Secondary | ICD-10-CM | POA: Diagnosis not present

## 2017-01-10 DIAGNOSIS — I5022 Chronic systolic (congestive) heart failure: Secondary | ICD-10-CM | POA: Diagnosis not present

## 2017-01-10 DIAGNOSIS — I10 Essential (primary) hypertension: Secondary | ICD-10-CM | POA: Diagnosis not present

## 2017-01-10 DIAGNOSIS — I447 Left bundle-branch block, unspecified: Secondary | ICD-10-CM | POA: Diagnosis not present

## 2017-01-12 ENCOUNTER — Emergency Department: Payer: Medicare HMO

## 2017-01-12 ENCOUNTER — Inpatient Hospital Stay: Payer: Medicare HMO

## 2017-01-12 ENCOUNTER — Other Ambulatory Visit: Payer: Self-pay

## 2017-01-12 ENCOUNTER — Inpatient Hospital Stay
Admission: EM | Admit: 2017-01-12 | Discharge: 2017-01-17 | DRG: 291 | Disposition: A | Discharge status: Skilled Nursing Facility | Payer: Medicare HMO | Attending: Specialist | Admitting: Specialist

## 2017-01-12 ENCOUNTER — Encounter: Payer: Self-pay | Admitting: Emergency Medicine

## 2017-01-12 DIAGNOSIS — E871 Hypo-osmolality and hyponatremia: Secondary | ICD-10-CM | POA: Diagnosis not present

## 2017-01-12 DIAGNOSIS — Z87891 Personal history of nicotine dependence: Secondary | ICD-10-CM

## 2017-01-12 DIAGNOSIS — I5023 Acute on chronic systolic (congestive) heart failure: Secondary | ICD-10-CM | POA: Diagnosis present

## 2017-01-12 DIAGNOSIS — R17 Unspecified jaundice: Secondary | ICD-10-CM

## 2017-01-12 DIAGNOSIS — I959 Hypotension, unspecified: Secondary | ICD-10-CM | POA: Diagnosis present

## 2017-01-12 DIAGNOSIS — I482 Chronic atrial fibrillation: Secondary | ICD-10-CM | POA: Diagnosis present

## 2017-01-12 DIAGNOSIS — I252 Old myocardial infarction: Secondary | ICD-10-CM | POA: Diagnosis not present

## 2017-01-12 DIAGNOSIS — Z9889 Other specified postprocedural states: Secondary | ICD-10-CM

## 2017-01-12 DIAGNOSIS — R0602 Shortness of breath: Secondary | ICD-10-CM

## 2017-01-12 DIAGNOSIS — Z86718 Personal history of other venous thrombosis and embolism: Secondary | ICD-10-CM | POA: Diagnosis not present

## 2017-01-12 DIAGNOSIS — Z7982 Long term (current) use of aspirin: Secondary | ICD-10-CM | POA: Diagnosis not present

## 2017-01-12 DIAGNOSIS — Z888 Allergy status to other drugs, medicaments and biological substances status: Secondary | ICD-10-CM

## 2017-01-12 DIAGNOSIS — I10 Essential (primary) hypertension: Secondary | ICD-10-CM | POA: Diagnosis not present

## 2017-01-12 DIAGNOSIS — N183 Chronic kidney disease, stage 3 (moderate): Secondary | ICD-10-CM | POA: Diagnosis present

## 2017-01-12 DIAGNOSIS — I350 Nonrheumatic aortic (valve) stenosis: Secondary | ICD-10-CM | POA: Diagnosis not present

## 2017-01-12 DIAGNOSIS — Z961 Presence of intraocular lens: Secondary | ICD-10-CM | POA: Diagnosis present

## 2017-01-12 DIAGNOSIS — Z7901 Long term (current) use of anticoagulants: Secondary | ICD-10-CM

## 2017-01-12 DIAGNOSIS — I5043 Acute on chronic combined systolic (congestive) and diastolic (congestive) heart failure: Secondary | ICD-10-CM | POA: Diagnosis not present

## 2017-01-12 DIAGNOSIS — I2510 Atherosclerotic heart disease of native coronary artery without angina pectoris: Secondary | ICD-10-CM | POA: Diagnosis not present

## 2017-01-12 DIAGNOSIS — F329 Major depressive disorder, single episode, unspecified: Secondary | ICD-10-CM | POA: Diagnosis present

## 2017-01-12 DIAGNOSIS — J90 Pleural effusion, not elsewhere classified: Secondary | ICD-10-CM

## 2017-01-12 DIAGNOSIS — E876 Hypokalemia: Secondary | ICD-10-CM | POA: Diagnosis present

## 2017-01-12 DIAGNOSIS — N179 Acute kidney failure, unspecified: Secondary | ICD-10-CM | POA: Diagnosis not present

## 2017-01-12 DIAGNOSIS — R0600 Dyspnea, unspecified: Secondary | ICD-10-CM | POA: Diagnosis not present

## 2017-01-12 DIAGNOSIS — Z959 Presence of cardiac and vascular implant and graft, unspecified: Secondary | ICD-10-CM | POA: Diagnosis not present

## 2017-01-12 DIAGNOSIS — Z9842 Cataract extraction status, left eye: Secondary | ICD-10-CM | POA: Diagnosis not present

## 2017-01-12 DIAGNOSIS — I248 Other forms of acute ischemic heart disease: Secondary | ICD-10-CM | POA: Diagnosis not present

## 2017-01-12 DIAGNOSIS — Z9911 Dependence on respirator [ventilator] status: Secondary | ICD-10-CM | POA: Diagnosis not present

## 2017-01-12 DIAGNOSIS — Z882 Allergy status to sulfonamides status: Secondary | ICD-10-CM

## 2017-01-12 DIAGNOSIS — Z7401 Bed confinement status: Secondary | ICD-10-CM | POA: Diagnosis not present

## 2017-01-12 DIAGNOSIS — I110 Hypertensive heart disease with heart failure: Secondary | ICD-10-CM | POA: Diagnosis not present

## 2017-01-12 DIAGNOSIS — I509 Heart failure, unspecified: Secondary | ICD-10-CM

## 2017-01-12 DIAGNOSIS — R7989 Other specified abnormal findings of blood chemistry: Secondary | ICD-10-CM | POA: Diagnosis not present

## 2017-01-12 DIAGNOSIS — I447 Left bundle-branch block, unspecified: Secondary | ICD-10-CM | POA: Diagnosis present

## 2017-01-12 DIAGNOSIS — E782 Mixed hyperlipidemia: Secondary | ICD-10-CM | POA: Diagnosis not present

## 2017-01-12 DIAGNOSIS — R4182 Altered mental status, unspecified: Secondary | ICD-10-CM | POA: Diagnosis not present

## 2017-01-12 DIAGNOSIS — Z79899 Other long term (current) drug therapy: Secondary | ICD-10-CM

## 2017-01-12 DIAGNOSIS — I130 Hypertensive heart and chronic kidney disease with heart failure and stage 1 through stage 4 chronic kidney disease, or unspecified chronic kidney disease: Principal | ICD-10-CM | POA: Diagnosis present

## 2017-01-12 DIAGNOSIS — F419 Anxiety disorder, unspecified: Secondary | ICD-10-CM | POA: Diagnosis not present

## 2017-01-12 DIAGNOSIS — Z90710 Acquired absence of both cervix and uterus: Secondary | ICD-10-CM

## 2017-01-12 LAB — CBC WITH DIFFERENTIAL/PLATELET
BASOS PCT: 1 %
Basophils Absolute: 0.1 10*3/uL (ref 0–0.1)
EOS ABS: 0 10*3/uL (ref 0–0.7)
EOS PCT: 0 %
HEMATOCRIT: 37.8 % (ref 35.0–47.0)
Hemoglobin: 11.7 g/dL — ABNORMAL LOW (ref 12.0–16.0)
Lymphocytes Relative: 14 %
Lymphs Abs: 1.2 10*3/uL (ref 1.0–3.6)
MCH: 22.2 pg — ABNORMAL LOW (ref 26.0–34.0)
MCHC: 31 g/dL — AB (ref 32.0–36.0)
MCV: 71.7 fL — ABNORMAL LOW (ref 80.0–100.0)
MONO ABS: 0.5 10*3/uL (ref 0.2–0.9)
MONOS PCT: 6 %
NEUTROS ABS: 6.8 10*3/uL — AB (ref 1.4–6.5)
Neutrophils Relative %: 79 %
PLATELETS: 185 10*3/uL (ref 150–440)
RBC: 5.27 MIL/uL — ABNORMAL HIGH (ref 3.80–5.20)
RDW: 19.6 % — AB (ref 11.5–14.5)
WBC: 8.7 10*3/uL (ref 3.6–11.0)

## 2017-01-12 LAB — COMPREHENSIVE METABOLIC PANEL
ALT: 15 U/L (ref 14–54)
ANION GAP: 11 (ref 5–15)
AST: 29 U/L (ref 15–41)
Albumin: 4.2 g/dL (ref 3.5–5.0)
Alkaline Phosphatase: 81 U/L (ref 38–126)
BILIRUBIN TOTAL: 2.4 mg/dL — AB (ref 0.3–1.2)
BUN: 38 mg/dL — ABNORMAL HIGH (ref 6–20)
CHLORIDE: 99 mmol/L — AB (ref 101–111)
CO2: 24 mmol/L (ref 22–32)
Calcium: 10.1 mg/dL (ref 8.9–10.3)
Creatinine, Ser: 1.39 mg/dL — ABNORMAL HIGH (ref 0.44–1.00)
GFR calc Af Amer: 37 mL/min — ABNORMAL LOW (ref >60)
GFR, EST NON AFRICAN AMERICAN: 32 mL/min — AB (ref >60)
Glucose, Bld: 139 mg/dL — ABNORMAL HIGH (ref 65–99)
POTASSIUM: 3.2 mmol/L — AB (ref 3.5–5.1)
Sodium: 134 mmol/L — ABNORMAL LOW (ref 135–145)
TOTAL PROTEIN: 7.1 g/dL (ref 6.5–8.1)

## 2017-01-12 LAB — BRAIN NATRIURETIC PEPTIDE: B Natriuretic Peptide: 2508 pg/mL — ABNORMAL HIGH (ref 0.0–100.0)

## 2017-01-12 LAB — MAGNESIUM: Magnesium: 2.2 mg/dL (ref 1.7–2.4)

## 2017-01-12 LAB — TROPONIN I
TROPONIN I: 0.12 ng/mL — AB (ref <0.03)
TROPONIN I: 0.11 ng/mL — AB (ref <0.03)

## 2017-01-12 MED ORDER — BISACODYL 5 MG PO TBEC: 5.0000 mg | DELAYED_RELEASE_TABLET | Freq: Every day | ORAL | Status: DC | PRN

## 2017-01-12 MED ORDER — ONDANSETRON HCL 4 MG PO TABS: 4.0000 mg | ORAL_TABLET | Freq: Four times a day (QID) | ORAL | Status: DC | PRN

## 2017-01-12 MED ORDER — FUROSEMIDE 10 MG/ML IJ SOLN
60.0000 mg | Freq: Once | INTRAMUSCULAR | Status: AC
  Administered 2017-01-12: 60 mg via INTRAVENOUS
  Filled 2017-01-12: qty 8

## 2017-01-12 MED ORDER — SENNOSIDES-DOCUSATE SODIUM 8.6-50 MG PO TABS: 1.0000 | ORAL_TABLET | Freq: Every evening | ORAL | Status: DC | PRN

## 2017-01-12 MED ORDER — HYDROCODONE-ACETAMINOPHEN 5-325 MG PO TABS: 1.0000 | ORAL_TABLET | ORAL | Status: DC | PRN

## 2017-01-12 MED ORDER — MONTELUKAST SODIUM 10 MG PO TABS
10.0000 mg | ORAL_TABLET | Freq: Every day | ORAL | Status: DC
  Administered 2017-01-12 – 2017-01-16 (×4): 10 mg via ORAL
  Filled 2017-01-12 (×5): qty 1

## 2017-01-12 MED ORDER — NITROGLYCERIN 0.3 MG SL SUBL: 0.3000 mg | SUBLINGUAL_TABLET | SUBLINGUAL | Status: DC | PRN

## 2017-01-12 MED ORDER — ACETAMINOPHEN 325 MG PO TABS
650.0000 mg | ORAL_TABLET | Freq: Four times a day (QID) | ORAL | Status: DC | PRN
  Administered 2017-01-14 – 2017-01-15 (×2): 650 mg via ORAL
  Filled 2017-01-12 (×3): qty 2

## 2017-01-12 MED ORDER — ONDANSETRON HCL 4 MG/2ML IJ SOLN: 4.0000 mg | Freq: Four times a day (QID) | INTRAMUSCULAR | Status: DC | PRN

## 2017-01-12 MED ORDER — ALBUTEROL SULFATE (2.5 MG/3ML) 0.083% IN NEBU
2.5000 mg | INHALATION_SOLUTION | RESPIRATORY_TRACT | Status: DC | PRN
  Administered 2017-01-12 – 2017-01-17 (×3): 2.5 mg via RESPIRATORY_TRACT
  Filled 2017-01-12 (×4): qty 3

## 2017-01-12 MED ORDER — BISOPROLOL FUMARATE 5 MG PO TABS
5.0000 mg | ORAL_TABLET | Freq: Every day | ORAL | Status: DC
  Administered 2017-01-12 – 2017-01-15 (×4): 5 mg via ORAL
  Filled 2017-01-12 (×6): qty 1

## 2017-01-12 MED ORDER — FUROSEMIDE 10 MG/ML IJ SOLN
40.0000 mg | Freq: Two times a day (BID) | INTRAMUSCULAR | Status: DC
  Administered 2017-01-12 – 2017-01-14 (×4): 40 mg via INTRAVENOUS
  Filled 2017-01-12 (×4): qty 4

## 2017-01-12 MED ORDER — FLUTICASONE PROPIONATE 50 MCG/ACT NA SUSP
2.0000 | Freq: Every day | NASAL | Status: DC
  Administered 2017-01-12 – 2017-01-17 (×3): 2 via NASAL
  Filled 2017-01-12 (×2): qty 16

## 2017-01-12 MED ORDER — ACETAMINOPHEN 650 MG RE SUPP: 650.0000 mg | Freq: Four times a day (QID) | RECTAL | Status: DC | PRN

## 2017-01-12 MED ORDER — SODIUM CHLORIDE 0.9% FLUSH
3.0000 mL | Freq: Two times a day (BID) | INTRAVENOUS | Status: DC
  Administered 2017-01-12 – 2017-01-17 (×10): 3 mL via INTRAVENOUS

## 2017-01-12 MED ORDER — SODIUM CHLORIDE 0.9 % IV SOLN: 250.0000 mL | INTRAVENOUS | Status: DC | PRN

## 2017-01-12 MED ORDER — NITROGLYCERIN 0.4 MG SL SUBL: 0.4000 mg | SUBLINGUAL_TABLET | SUBLINGUAL | Status: DC | PRN

## 2017-01-12 MED ORDER — ESCITALOPRAM OXALATE 10 MG PO TABS
10.0000 mg | ORAL_TABLET | Freq: Two times a day (BID) | ORAL | Status: DC
  Administered 2017-01-12 – 2017-01-17 (×11): 10 mg via ORAL
  Filled 2017-01-12 (×12): qty 1

## 2017-01-12 MED ORDER — POTASSIUM CHLORIDE CRYS ER 20 MEQ PO TBCR
20.0000 meq | EXTENDED_RELEASE_TABLET | Freq: Every day | ORAL | Status: DC
  Administered 2017-01-12 – 2017-01-15 (×4): 20 meq via ORAL
  Filled 2017-01-12 (×5): qty 1

## 2017-01-12 MED ORDER — SODIUM CHLORIDE 0.9% FLUSH
3.0000 mL | INTRAVENOUS | Status: DC | PRN
  Administered 2017-01-17: 3 mL via INTRAVENOUS
  Filled 2017-01-12: qty 3

## 2017-01-12 NOTE — ED Provider Notes (Signed)
[facility] Emergency Department Provider Note  Time seen: 10:24 AM  I have reviewed the triage vital signs and the nursing notes.   HISTORY  Chief Complaint Shortness of Breath    HPI Wanda Davenport is a 84 y.o. female with a past medical history of anxiety, CHF, hypertension, hyperlipidemia, presents to the emergency department with shortness of breath.  According to the patient for the past several weeks she has had progressive worsening shortness of breath.  States has worsened significantly over the past 2-3 days along with lower extremity edema.  Patient went to her doctor yesterday who increased her diuretic.  Family states since starting the diuretic the patient has not urinated any significant amount.  States she is dribbling urine out but no significant urination.  She gained 2 pounds from last night to this morning again with worsening shortness of breath so they brought her to the emergency department for evaluation.  Patient denies any chest pain.  States cough but no worse than normal.  Denies fever.  Does admit to lower extremity edema, worse than normal currently.  Past Medical History:  Diagnosis Date  . Allergic rhinitis   . Anxiety   . Arthritis   . Atypical chest pain   . Bronchitis 01/14/2016  . Chronic diastolic CHF (congestive heart failure) (HCC)    Echo 06/2010 EF 60-65%, no RWMAs, grade 1 diastolic dysfunction, mild LAE, PASP 86mmHg.  Marland Kitchen Coronary artery disease    MI  . DIVERTICULOSIS, COLON   . HEMORRHOIDS, INTERNAL   . Hepatic steatosis   . Hyperlipidemia   . HYPERTENSION   . LBBB (left bundle branch block)    a. present on ECG 06/2010  . Osteopenia   . Pre-diabetes    A1c 6.0    Patient Active Problem List   Diagnosis Date Noted  . Bilateral leg edema 09/05/2016  . Sinusitis, acute maxillary 07/12/2016  . Cough 07/12/2016  . Shortness of breath 07/12/2016  . Pleural effusion, left 07/12/2016  . Chronic systolic heart  failure ([place]) 06/15/2016  . Acute MI anterior wall subsequent episode care ([place]) 06/04/2016  . PE (pulmonary thromboembolism) ([place]) 05/19/2016  . Major depression in remission ([place]) 05/07/2016  . Pneumonia, community acquired 05/07/2016  . Unstable angina ([place])   . Coronary artery disease of native artery of native heart with stable angina pectoris ([place]) 05/01/2016  . STEMI (ST elevation myocardial infarction) ([place]) 04/26/2016  . Knee pain, bilateral 04/10/2016  . Medication monitoring encounter 01/26/2016  . Intolerance of drug- statins 01/25/2016  . Counseling regarding end of life decision making 01/25/2016  . Hypertriglyceridemia 12/13/2015  . Low serum HDL 12/13/2015  . B12 deficiency 12/13/2015  . Claudication of left lower extremity ([place]) 12/13/2015  . Vitamin D deficiency 11/06/2015  . Adjustment disorder with mixed anxiety and depressed mood 11/06/2015  . Basal cell carcinoma 10/31/2015  . Environmental and seasonal allergies 10/31/2015  . Reactive airway disease- only spring and fall due to allergies 10/31/2015  . GAD (generalized anxiety disorder) 10/13/2015  . Atypical chest pain 04/16/2011  . Hyperlipidemia   . Pre-diabetes   . chronic LBBB (left bundle branch block)- since 2012   . Generalized OA   . Allergic rhinitis   . DIVERTICULOSIS, COLON 12/03/2007  . HTN (hypertension) 04/26/2007    Past Surgical History:  Procedure Laterality Date  . ABDOMINAL HYSTERECTOMY    . BREAST SURGERY    . CATARACT EXTRACTION W/PHACO Left 05/12/2014   Procedure: CATARACT EXTRACTION  PHACO AND INTRAOCULAR LENS PLACEMENT (IOC);  Surgeon: Leandrew Koyanagi, MD;  Location: [place];  Service: Ophthalmology;  Laterality: Left;  . COLONOSCOPY     a. 2010  . CORONARY STENT INTERVENTION N/A 04/26/2016   Procedure: Coronary Stent Intervention;  Surgeon: Isaias Cowman, MD;  Location: [place] [facility];  Service: Cardiovascular;  Laterality: N/A;  . LEFT HEART CATH AND  CORONARY ANGIOGRAPHY N/A 04/26/2016   Procedure: Left Heart Cath and Coronary Angiography;  Surgeon: Isaias Cowman, MD;  Location: [place] [facility];  Service: Cardiovascular;  Laterality: N/A;  . LEFT HEART CATH AND CORONARY ANGIOGRAPHY N/A 05/05/2016   Procedure: Left Heart Cath and Coronary Angiography;  Surgeon: Wellington Hampshire, MD;  Location: [place] [facility];  Service: Cardiovascular;  Laterality: N/A;  . TONSILLECTOMY      Prior to Admission medications   Medication Sig Start Date End Date Taking? Authorizing Provider  albuterol (PROVENTIL HFA;VENTOLIN HFA) 108 (90 BASE) MCG/ACT inhaler Inhale 2 puffs into the lungs every 6 (six) hours as needed. Wheezing or shortness of breath    [provider]  apixaban (ELIQUIS) 5 MG TABS tablet Take 10 mg (2 tabs) twice a day till 05/22/16 and then from 05/23/16 take 1 tab (5 mg ) two times a day 05/17/16   Fritzi Mandes, MD  aspirin 325 MG EC tablet Take 325 mg by mouth daily.    [provider]  bisoprolol (ZEBETA) 10 MG tablet Take 0.5 tablets (5 mg total) by mouth daily. 10/15/16 10/31/16  Opalski, Deborah, DO  escitalopram (LEXAPRO) 10 MG tablet Take 1 tablet (10 mg total) by mouth 2 (two) times daily. 10/15/16   Opalski, Deborah, DO  fluticasone (FLONASE) 50 MCG/ACT nasal spray Place 2 sprays into the nose daily. Nasal congestion Rarely used     [provider]  furosemide (LASIX) 40 MG tablet Take 1.5 tablets by mouth daily. 07/23/16   [provider]  isosorbide mononitrate (IMDUR) 30 MG 24 hr tablet Take 1 tablet (30 mg total) by mouth daily. 04/30/16   Fritzi Mandes, MD  montelukast (SINGULAIR) 10 MG tablet Take 1 tablet (10 mg total) by mouth at bedtime. 08/14/16   Danford, Valetta Fuller D, NP  nitroGLYCERIN (NITROSTAT) 0.3 MG SL tablet Place 0.3 mg under the tongue every 5 (five) minutes as needed for chest pain.    [provider]    Allergies  Allergen Reactions  . Fenofibrate Other (See  Comments)    Myalgias/gi upset   . Statins Other (See Comments)    Myalgias   . Sulfonamide Derivatives Nausea And Vomiting    REACTION: nausea    Family History  Problem Relation Age of Onset  . Colon cancer Sister   . Melanoma Sister   . Hyperlipidemia Brother   . Hypertension Mother   . Lupus Mother        died 36 from surgical complications  . Alzheimer's disease Brother        unknown  . Kidney disease Father        died 55  . Hypertension Father   . Alcohol abuse Father     Social History Social History   Tobacco Use  . Smoking status: Former Smoker    Packs/day: 0.25    Years: 20.00    Pack years: 5.00    Types: Cigarettes    Last attempt to quit: 01/02/1971    Years since quitting: 46.0  . Smokeless tobacco: Never Used  . Tobacco  comment: smoked a few cigarettes/day x 20 yrs - quit @ age 25.  Substance Use Topics  . Alcohol use: No  . Drug use: No    Review of Systems Constitutional: Negative for fever. Eyes: Negative for visual complaints ENT: Negative for congestion Cardiovascular: Negative for chest pain. Respiratory: Positive for shortness of breath, worse with any exertion. Gastrointestinal: Negative for abdominal pain, vomiting Genitourinary: Decreased urination. Musculoskeletal: Negative for back pain. Skin: No skin complaints Neurological: Negative for headache All other ROS negative  ____________________________________________   PHYSICAL EXAM:  VITAL SIGNS: ED Triage Vitals  Enc Vitals Group     BP 01/12/17 1000 (!) 108/59     Pulse Rate 01/12/17 1000 92     Resp 01/12/17 1000 20     Temp 01/12/17 1000 97.8 F (36.6 C)     Temp Source 01/12/17 1000 Oral     SpO2 01/12/17 1000 97 %     Weight 01/12/17 1000 157 lb (71.2 kg)     Height 01/12/17 1000 5\' 3"  (1.6 m)     Head Circumference --      Peak Flow --      Pain Score 01/12/17 1005 0     Pain Loc --      Pain Edu? --      Excl. in [place]? --    Constitutional: Alert and  oriented. Well appearing and in no distress. Eyes: Normal exam ENT   Head: Normocephalic and atraumatic   Mouth/Throat: Mucous membranes are moist. Cardiovascular: Regular rhythm, rate around 100 bpm. Respiratory: Mild tachypnea, breath sounds are grossly clear bilaterally although appear to be diminished bilaterally.  No obvious rales or rhonchi. Gastrointestinal: Soft and nontender. No distention.  Musculoskeletal: 1-2+ lower extremity edema, equal bilaterally, nontender calves. Neurologic:  Normal speech and language. No gross focal neurologic deficits Skin:  Skin is warm, dry and intact.  Psychiatric: Mood and affect are normal.   ____________________________________________    EKG  EKG reviewed and interpreted by myself shows atrial fibrillation at 87 bpm with a widened QRS, left axis deviation, otherwise largely normal intervals with nonspecific ST changes but no ST elevation.  ____________________________________________    RADIOLOGY  X-ray shows CHF as well as a large left pleural effusion.  ____________________________________________   INITIAL IMPRESSION / ASSESSMENT AND PLAN / ED COURSE  Pertinent labs & imaging results that were available during my care of the patient were reviewed by me and considered in my medical decision making (see chart for details).  Patient presents to the emergency department for worsening shortness of breath.  Differential would include CHF exacerbation, pneumonia, pneumothorax, ACS.  We will check labs including troponin and BNP.  Will obtain an x-ray, continue to closely monitor the patient while awaiting lab and imaging results.  Patient currently satting around 90% on her normal 0.5 L of oxygen, will increase as needed.  X-ray shows a large left pleural effusion as well as changes consistent with CHF/edema.  We will dose IV Lasix.  Patient's troponin mildly elevated 0.12 largely unchanged from her baseline troponin.  We will  discussed with the hospitalist for admission for further treatment. ____________________________________________   FINAL CLINICAL IMPRESSION(S) / ED DIAGNOSES  Dyspnea CHF Pleural effusion   Harvest Dark, MD 01/12/17 1132

## 2017-01-12 NOTE — ED Notes (Signed)
Charge nurse has not assigned bed to RN yet, will call me back within 15 minutes.

## 2017-01-12 NOTE — H&P (Addendum)
[place] [facility] [place] NAME: Wanda Davenport    MR#:  [id number]  DATE OF BIRTH:  02-08-1923  DATE OF ADMISSION:  01/12/2017  PRIMARY CARE PHYSICIAN: Mellody Dance, DO   REQUESTING/REFERRING PHYSICIAN: Harvest Dark, MD  CHIEF COMPLAINT:   Chief Complaint  Patient presents with  . Shortness of Breath   Worsening shortness of breath and cough for 2-3 days. HISTORY OF PRESENT ILLNESS:  Wanda Davenport  is a 84 y.o. female with a known history of medical problems as below.  The patient has had worsening shortness of breath and the cough for the past 2-3 days.  She also has worsening leg swelling and weight gain.  Her cardiologist Dr. Nehemiah Massed increased her diuretics 3 days ago but she has less urine output and again weight 2 pounds since last night.  She regularly use home oxygen 0.5 L.  Chest x-ray show congestive heart failure with left large pleural effusion.  She is treated with IV Lasix 60 mg in the ED.  PAST MEDICAL HISTORY:   Past Medical History:  Diagnosis Date  . Allergic rhinitis   . Anxiety   . Arthritis   . Atypical chest pain   . Bronchitis 01/14/2016  . Chronic diastolic CHF (congestive heart failure) (HCC)    Echo 06/2010 EF 60-65%, no RWMAs, grade 1 diastolic dysfunction, mild LAE, PASP 43mmHg.  Marland Kitchen Coronary artery disease    MI  . DIVERTICULOSIS, COLON   . HEMORRHOIDS, INTERNAL   . Hepatic steatosis   . Hyperlipidemia   . HYPERTENSION   . LBBB (left bundle branch block)    a. present on ECG 06/2010  . Osteopenia   . Pre-diabetes    A1c 6.0    PAST SURGICAL HISTORY:   Past Surgical History:  Procedure Laterality Date  . ABDOMINAL HYSTERECTOMY    . BREAST SURGERY    . CATARACT EXTRACTION W/PHACO Left 05/12/2014   Procedure: CATARACT EXTRACTION PHACO AND INTRAOCULAR LENS PLACEMENT (IOC);  Surgeon: Leandrew Koyanagi, MD;  Location: [facility];  Service: Ophthalmology;  Laterality: Left;  . COLONOSCOPY      a. 2010  . CORONARY STENT INTERVENTION N/A 04/26/2016   Procedure: Coronary Stent Intervention;  Surgeon: Isaias Cowman, MD;  Location: [place] [facility];  Service: Cardiovascular;  Laterality: N/A;  . LEFT HEART CATH AND CORONARY ANGIOGRAPHY N/A 04/26/2016   Procedure: Left Heart Cath and Coronary Angiography;  Surgeon: Isaias Cowman, MD;  Location: [facility] [facility];  Service: Cardiovascular;  Laterality: N/A;  . LEFT HEART CATH AND CORONARY ANGIOGRAPHY N/A 05/05/2016   Procedure: Left Heart Cath and Coronary Angiography;  Surgeon: Wellington Hampshire, MD;  Location: [facility];  Service: Cardiovascular;  Laterality: N/A;  . TONSILLECTOMY      SOCIAL HISTORY:   Social History   Tobacco Use  . Smoking status: Former Smoker    Packs/day: 0.25    Years: 20.00    Pack years: 5.00    Types: Cigarettes    Last attempt to quit: 01/02/1971    Years since quitting: 46.0  . Smokeless tobacco: Never Used  . Tobacco comment: smoked a few cigarettes/day x 20 yrs - quit @ age 86.  Substance Use Topics  . Alcohol use: No    FAMILY HISTORY:   Family History  Problem Relation Age of Onset  . Colon cancer Sister   . Melanoma Sister   . Hyperlipidemia Brother   . Hypertension Mother   .  Lupus Mother        died 6 from surgical complications  . Alzheimer's disease Brother        unknown  . Kidney disease Father        died 42  . Hypertension Father   . Alcohol abuse Father     DRUG ALLERGIES:   Allergies  Allergen Reactions  . Fenofibrate Other (See Comments)    Myalgias/gi upset   . Statins Other (See Comments)    Myalgias   . Sulfonamide Derivatives Nausea And Vomiting    REACTION: nausea    REVIEW OF SYSTEMS:   Review of Systems  Constitutional: Positive for malaise/fatigue. Negative for chills and fever.  HENT: Negative for sore throat.   Eyes: Negative for blurred vision and double vision.  Respiratory: Positive for cough and shortness of  breath. Negative for hemoptysis, sputum production, wheezing and stridor.   Cardiovascular: Positive for leg swelling. Negative for chest pain, palpitations and orthopnea.  Gastrointestinal: Negative for abdominal pain, blood in stool, diarrhea, melena, nausea and vomiting.  Genitourinary: Negative for dysuria, flank pain and hematuria.  Musculoskeletal: Negative for back pain and joint pain.  Skin: Negative for rash.  Neurological: Positive for weakness. Negative for dizziness, sensory change, focal weakness, seizures, loss of consciousness and headaches.  Endo/Heme/Allergies: Negative for polydipsia.  Psychiatric/Behavioral: Negative for depression. The patient is not nervous/anxious.     MEDICATIONS AT HOME:   Prior to Admission medications   Medication Sig Start Date End Date Taking? Authorizing Provider  apixaban (ELIQUIS) 5 MG TABS tablet Take 10 mg (2 tabs) twice a day till 05/22/16 and then from 05/23/16 take 1 tab (5 mg ) two times a day 05/17/16  Yes Fritzi Mandes, MD  aspirin 81 MG tablet Take by mouth daily.    Yes [provider]  escitalopram (LEXAPRO) 10 MG tablet Take 1 tablet (10 mg total) by mouth 2 (two) times daily. 10/15/16  Yes Opalski, Deborah, DO  montelukast (SINGULAIR) 10 MG tablet Take 1 tablet (10 mg total) by mouth at bedtime. 08/14/16  Yes Danford, Valetta Fuller D, NP  torsemide (DEMADEX) 20 MG tablet Take 2 tablets by mouth 2 (two) times daily. 01/10/17  Yes [provider]  albuterol (PROVENTIL HFA;VENTOLIN HFA) 108 (90 BASE) MCG/ACT inhaler Inhale 2 puffs into the lungs every 6 (six) hours as needed. Wheezing or shortness of breath    [provider]  bisoprolol (ZEBETA) 10 MG tablet Take 0.5 tablets (5 mg total) by mouth daily. 10/15/16 10/31/16  Opalski, Deborah, DO  fluticasone (FLONASE) 50 MCG/ACT nasal spray Place 2 sprays into the nose daily. Nasal congestion Rarely used     [provider]  isosorbide mononitrate (IMDUR) 30 MG 24 hr  tablet Take 1 tablet (30 mg total) by mouth daily. Patient not taking: Reported on 01/12/2017 04/30/16   Fritzi Mandes, MD  nitroGLYCERIN (NITROSTAT) 0.3 MG SL tablet Place 0.3 mg under the tongue every 5 (five) minutes as needed for chest pain.    [provider]      VITAL SIGNS:  Blood pressure (!) 108/59, pulse 92, temperature 97.8 F (36.6 C), temperature source Oral, resp. rate 20, height 5\' 3"  (1.6 m), weight 157 lb (71.2 kg), SpO2 97 %.  PHYSICAL EXAMINATION:  Physical Exam  GENERAL:  84 y.o.-year-old patient lying in the bed with no acute distress.  EYES: Pupils equal, round, reactive to light and accommodation. No scleral icterus. Extraocular muscles intact.  HEENT: Head atraumatic, normocephalic. Oropharynx  and nasopharynx clear.  NECK:  Supple, no jugular venous distention. No thyroid enlargement, no tenderness.  LUNGS: Left-sided diminished lung sounds.  Bilateral rales.  No wheezing.  No use of accessory muscles of respiration.  CARDIOVASCULAR: S1, S2 normal. No murmurs, rubs, or gallops.  ABDOMEN: Soft, nontender, nondistended. Bowel sounds present. No organomegaly or mass.  EXTREMITIES: No cyanosis, or clubbing.  Bilateral leg edema 1-2+. NEUROLOGIC: Cranial nerves II through XII are intact. Muscle strength 4/5 in all extremities. Sensation intact. Gait not checked.  PSYCHIATRIC: The patient is alert and oriented x 3.  SKIN: No obvious rash, lesion, or ulcer.   LABORATORY PANEL:   CBC Recent Labs  Lab 01/12/17 1023  WBC 8.7  HGB 11.7*  HCT 37.8  PLT 185   ------------------------------------------------------------------------------------------------------------------  Chemistries  Recent Labs  Lab 01/12/17 1023  NA 134*  K 3.2*  CL 99*  CO2 24  GLUCOSE 139*  BUN 38*  CREATININE 1.39*  CALCIUM 10.1  AST 29  ALT 15  ALKPHOS 81  BILITOT 2.4*    ------------------------------------------------------------------------------------------------------------------  Cardiac Enzymes Recent Labs  Lab 01/12/17 1023  TROPONINI 0.12*   ------------------------------------------------------------------------------------------------------------------  RADIOLOGY:  Dg Chest Portable 1 View  Result Date: 01/12/2017 CLINICAL DATA:  Shortness of Breath EXAM: PORTABLE CHEST 1 VIEW COMPARISON:  07/12/2016 FINDINGS: Large left pleural effusion, worsened since prior study. Left lower lobe atelectasis. Cardiomegaly with vascular congestion. Suspect mild interstitial edema. IMPRESSION: Large left pleural effusion, increasing since prior study. Left lower lobe atelectasis. Suspect mild edema/CHF. Electronically Signed   By: Rolm Baptise M.D.   On: 01/12/2017 10:57      IMPRESSION AND PLAN:   Acute on chronic systolic CHF. The patient will be admitted to telemetry floor. Start CHF protocol, Lasix 40 mg IV every 12 hours, follow-up cardiology consult from Dr. Nehemiah Massed.  Left-sided large pleural effusion, possible due to CHF. Continue IV Lasix, medical treatment for now, hold Eliquis for possible thoracentesis by radiologist.  History of chronic A. fib.  Continue bisoprolol but hold apixaban for possible thoracentesis.  Elevated troponin.  Due to demanding ischemia secondary to above.  Follow-up troponin level.  Hypokalemia.  Give potassium supplement and follow-up level.  Acute renal failure and hyponatremia due to CHF.  Follow-up BMP while on Lasix.  Elevated bilirubin.  Unclear etiology.  Follow-up level and abdominal ultrasound.  All the records are reviewed and case discussed with ED provider. Management plans discussed with the patient, daughter and son, and they are in agreement.  CODE STATUS: Full code  TOTAL TIME TAKING CARE OF THIS PATIENT: 56 minutes.    Demetrios Loll M.D on 01/12/2017 at 12:22 PM  Between 7am to 6pm - Pager -  [phone]  After 6pm go to [email or web address] - Proofreader  [facility]  Office  [phone]  CC: Primary care physician; Mellody Dance, DO   Note: This dictation was prepared with Dragon dictation along with smaller phrase technology. Any transcriptional errors that result from this process are unin

## 2017-01-12 NOTE — ED Triage Notes (Signed)
Patient to ER for c/o increasing shortness of breath. States she has gained 2lbs since last night. Patient saw PCP couple days ago and was placed on 2 additional fluid pills. Still has not been producing more urine despite taking medications.

## 2017-01-12 NOTE — ED Notes (Signed)
FN: pt reports increased shortness of breath, gained two pounds over night, has taken fluid pills with very little output.

## 2017-01-12 NOTE — Progress Notes (Signed)
Pt noted to have expiratory wheezes in upper lobes.  Pt given albuterol neb by this RN.

## 2017-01-13 LAB — BASIC METABOLIC PANEL
ANION GAP: 10 (ref 5–15)
BUN: 40 mg/dL — ABNORMAL HIGH (ref 6–20)
CO2: 23 mmol/L (ref 22–32)
Calcium: 10.1 mg/dL (ref 8.9–10.3)
Chloride: 103 mmol/L (ref 101–111)
Creatinine, Ser: 1.6 mg/dL — ABNORMAL HIGH (ref 0.44–1.00)
GFR calc Af Amer: 31 mL/min — ABNORMAL LOW (ref >60)
GFR, EST NON AFRICAN AMERICAN: 27 mL/min — AB (ref >60)
GLUCOSE: 119 mg/dL — AB (ref 65–99)
POTASSIUM: 4 mmol/L (ref 3.5–5.1)
Sodium: 136 mmol/L (ref 135–145)

## 2017-01-13 LAB — CBC
HEMATOCRIT: 37.3 % (ref 35.0–47.0)
HEMOGLOBIN: 11.6 g/dL — AB (ref 12.0–16.0)
MCH: 22.1 pg — ABNORMAL LOW (ref 26.0–34.0)
MCHC: 31.1 g/dL — AB (ref 32.0–36.0)
MCV: 71.3 fL — ABNORMAL LOW (ref 80.0–100.0)
Platelets: 166 10*3/uL (ref 150–440)
RBC: 5.23 MIL/uL — ABNORMAL HIGH (ref 3.80–5.20)
RDW: 19.9 % — ABNORMAL HIGH (ref 11.5–14.5)
WBC: 8 10*3/uL (ref 3.6–11.0)

## 2017-01-13 LAB — BILIRUBIN, TOTAL: BILIRUBIN TOTAL: 2 mg/dL — AB (ref 0.3–1.2)

## 2017-01-13 MED ORDER — HEPARIN SODIUM (PORCINE) 5000 UNIT/ML IJ SOLN: 5000.0000 [IU] | Freq: Three times a day (TID) | INTRAMUSCULAR | Status: DC

## 2017-01-13 NOTE — Progress Notes (Signed)
[facility] NAME: Wanda Davenport    MR#:  [id number]  DATE OF BIRTH:  April 23, 1923  SUBJECTIVE:   Pt. Here due to shortness of breath and a large left-sided pleural effusion. Noted to be in acute on chronic systolic CHF. During diuresis with IV Lasix. Plan for ultrasound-guided thoracentesis tomorrow.  REVIEW OF SYSTEMS:    Review of Systems  Constitutional: Negative for chills and fever.  HENT: Negative for congestion and tinnitus.   Eyes: Negative for blurred vision and double vision.  Respiratory: Positive for shortness of breath. Negative for cough and wheezing.   Cardiovascular: Negative for chest pain, orthopnea and PND.  Gastrointestinal: Negative for abdominal pain, diarrhea, nausea and vomiting.  Genitourinary: Negative for dysuria and hematuria.  Neurological: Positive for weakness. Negative for dizziness, sensory change and focal weakness.  All other systems reviewed and are negative.    Nutrition: Heart Healthy Tolerating Diet: Yes Tolerating PT: Await Eval.   DRUG ALLERGIES:   Allergies  Allergen Reactions  . Fenofibrate Other (See Comments)    Myalgias/gi upset   . Statins Other (See Comments)    Myalgias   . Sulfonamide Derivatives Nausea And Vomiting    REACTION: nausea    VITALS:  Blood pressure (!) 106/59, pulse 63, temperature (!) 97.5 F (36.4 C), resp. rate 18, height 5\' 3"  (1.6 m), weight 70.6 kg (155 lb 9.6 oz), SpO2 100 %.  PHYSICAL EXAMINATION:   Physical Exam  GENERAL:  84 y.o.-year-old patient lying in bed in no acute distress.  EYES: Pupils equal, round, reactive to light and accommodation. No scleral icterus. Extraocular muscles intact.  HEENT: Head atraumatic, normocephalic. Oropharynx and nasopharynx clear.  NECK:  Supple, no jugular venous distention. No thyroid enlargement, no tenderness.  LUNGS: Poor A/E on Left mid-lower lung fields, no wheezing, rales, rhonchi. No use of accessory  muscles of respiration.  CARDIOVASCULAR: S1, S2 normal. No murmurs, rubs, or gallops.  ABDOMEN: Soft, nontender, nondistended. Bowel sounds present. No organomegaly or mass.  EXTREMITIES: No cyanosis, clubbing, + 2 edema b/l.  NEUROLOGIC: Cranial nerves II through XII are intact. No focal Motor or sensory deficits b/l.  Globally weak.  PSYCHIATRIC: The patient is alert and oriented x 3.  SKIN: No obvious rash, lesion, or ulcer.    LABORATORY PANEL:   CBC Recent Labs  Lab 01/13/17 0440  WBC 8.0  HGB 11.6*  HCT 37.3  PLT 166   ------------------------------------------------------------------------------------------------------------------  Chemistries  Recent Labs  Lab 01/12/17 1023 01/13/17 0440  NA 134* 136  K 3.2* 4.0  CL 99* 103  CO2 24 23  GLUCOSE 139* 119*  BUN 38* 40*  CREATININE 1.39* 1.60*  CALCIUM 10.1 10.1  MG 2.2  --   AST 29  --   ALT 15  --   ALKPHOS 81  --   BILITOT 2.4* 2.0*   ------------------------------------------------------------------------------------------------------------------  Cardiac Enzymes Recent Labs  Lab 01/12/17 1527  TROPONINI 0.11*   ------------------------------------------------------------------------------------------------------------------  RADIOLOGY:  Dg Chest Portable 1 View  Result Date: 01/12/2017 CLINICAL DATA:  Shortness of Breath EXAM: PORTABLE CHEST 1 VIEW COMPARISON:  07/12/2016 FINDINGS: Large left pleural effusion, worsened since prior study. Left lower lobe atelectasis. Cardiomegaly with vascular congestion. Suspect mild interstitial edema. IMPRESSION: Large left pleural effusion, increasing since prior study. Left lower lobe atelectasis. Suspect mild edema/CHF. Electronically Signed   By: Rolm Baptise M.D.   On: 01/12/2017 10:57   US Abdomen Limited Ruq  Result Date:  01/12/2017 CLINICAL DATA:  Elevated bilirubin level. EXAM: ULTRASOUND ABDOMEN LIMITED RIGHT UPPER QUADRANT COMPARISON:  06/17/2010  FINDINGS: Gallbladder: No gallstones or wall thickening visualized. No sonographic Murphy sign noted by sonographer. Common bile duct: Diameter: Normal, 4 mm Liver: No focal lesion identified. Within normal limits in parenchymal echogenicity. Portal vein is patent on color Doppler imaging with normal direction of blood flow towards the liver. Right pleural effusion. IMPRESSION: 1.  No acute process or explanation for elevated bilirubin level. 2. Right pleural effusion. Electronically Signed   By: Abigail Miyamoto M.D.   On: 01/12/2017 16:37     ASSESSMENT AND PLAN:   84 year old female with past medical history hypertension, hyperlipidemia, coronary artery disease, chronic systolic CHF, anxiety who presents to the hospital due to worsening lower extremity edema and shortness of breath and noted to be in congestive heart failure.  1. CHF-acute on chronic systolic dysfunction. - Patient apparetly doubled her torsemide at home without much improvement in her symptoms. -Continue diuresis with IV Lasix, follow I's and O's and daily weights. Patient had a large left-sided pleural effusion on chest x-ray yesterday and plan for ultrasound-guided thoracentesis tomorrow.  2. Left-sided pleural effusion-suspected to be secondary to CHF. Continue diuresis with IV Lasix, plan for ultrasound-guided thoracentesis tomorrow.  3. Essential hypertension-continue bisoprolol  4. Depression-continue Lexapro.  5. History of previous DVT-Eliquis is on hold for now due to pt. To have thoracentesis tomorrwo.  - will resume post-procedure.   All the records are reviewed and case discussed with Care Management/Social Worker. Management plans discussed with the patient, family and they are in agreement.  CODE STATUS: Full code  DVT Prophylaxis: Ted's & SCD's.   TOTAL TIME TAKING CARE OF THIS PATIENT: 30 minutes.   POSSIBLE D/C IN 1-2 DAYS, DEPENDING ON CLINICAL CONDITION.   Henreitta Leber M.D on 01/13/2017 at 1:40  PM  Between 7am to 6pm - Pager - [phone]  After 6pm go to [email or web address] - [employer] [facility]  Office  [phone]  CC: Primary care physician; Mellody Dance, DO

## 2017-01-13 NOTE — Plan of Care (Signed)
  Progressing Clinical Measurements: Ability to maintain clinical measurements within normal limits will improve 01/13/2017 1830 - Progressing by Liliane Channel, RN Respiratory complications will improve 01/13/2017 1830 - Progressing by Liliane Channel, RN Activity: Risk for activity intolerance will decrease 01/13/2017 1830 - Progressing by Liliane Channel, RN Coping: Level of anxiety will decrease 01/13/2017 1830 - Progressing by Liliane Channel, RN Safety: Ability to remain free from injury will improve 01/13/2017 1830 - Progressing by Liliane Channel, RN

## 2017-01-13 NOTE — Consult Note (Signed)
[facility] Consultation Note  Patient ID: Wanda Davenport, MRN: [id number], DOB/AGE: 82/23/1925 82 y.o. Admit date: 01/12/2017   Date of Consult: 01/13/2017 Primary Physician: Mellody Dance, DO Primary Cardiologist: Nehemiah Massed  Chief Complaint:  Chief Complaint  Patient presents with  . Shortness of Breath   Reason for Consult: Congestive heart failure  HPI: 84 y.o. female with known chronic systolic dysfunction congestive heart failure valvular heart disease essential hypertension mixed hyperlipidemia and atrial fibrillation who has had worsening acute on chronic systolic dysfunction heart failure over the last several weeks.  She was seen in the office late last week when the patient had significant worsening shortness of breath lower extremity edema and weight gain of 7 pounds.  The patient has had some dependent edema as well and needed further treatment.  Lasix was increased with some episode of hypotension and the patient did not respond to this increase in diuretic other than having worsening urine output and hypotension.  The patient then was seen in the emergency pleural effusions and pulmonary edema and acute on chronic systolic dysfunction heart failure.  The patient was given intravenous Lasix with significant increase in urine output and also some weight loss with this.  This is helped a great deal.  The patient does have known cardiovascular disease and atrial fibrillation time showing chronic nonvalvular atrial fibrillation with left bundle branch block unchanged from before.  In addition to that patient is not been on anticoagulation due to patient's wishes family wishes and previous concerns of fall and GI bleed.  Additionally the patient has had an elevated troponin at this time was 0.11 consistent with demand ischemia from congestive heart failure and hypoxia  Past Medical History:  Diagnosis Date  . Allergic rhinitis   . Anxiety   . Arthritis   . Atypical chest  pain   . Bronchitis 01/14/2016  . Chronic diastolic CHF (congestive heart failure) (HCC)    Echo 06/2010 EF 60-65%, no RWMAs, grade 1 diastolic dysfunction, mild LAE, PASP 11mmHg.  Marland Kitchen Coronary artery disease    MI  . DIVERTICULOSIS, COLON   . HEMORRHOIDS, INTERNAL   . Hepatic steatosis   . Hyperlipidemia   . HYPERTENSION   . LBBB (left bundle branch block)    a. present on ECG 06/2010  . Osteopenia   . Pre-diabetes    A1c 6.0      Surgical History:  Past Surgical History:  Procedure Laterality Date  . ABDOMINAL HYSTERECTOMY    . BREAST SURGERY    . CATARACT EXTRACTION W/PHACO Left 05/12/2014   Procedure: CATARACT EXTRACTION PHACO AND INTRAOCULAR LENS PLACEMENT (IOC);  Surgeon: Leandrew Koyanagi, MD;  Location: [facility];  Service: Ophthalmology;  Laterality: Left;  . COLONOSCOPY     a. 2010  . CORONARY STENT INTERVENTION N/A 04/26/2016   Procedure: Coronary Stent Intervention;  Surgeon: Isaias Cowman, MD;  Location: [facility];  Service: Cardiovascular;  Laterality: N/A;  . LEFT HEART CATH AND CORONARY ANGIOGRAPHY N/A 04/26/2016   Procedure: Left Heart Cath and Coronary Angiography;  Surgeon: Isaias Cowman, MD;  Location: [facility];  Service: Cardiovascular;  Laterality: N/A;  . LEFT HEART CATH AND CORONARY ANGIOGRAPHY N/A 05/05/2016   Procedure: Left Heart Cath and Coronary Angiography;  Surgeon: Wellington Hampshire, MD;  Location: [facility];  Service: Cardiovascular;  Laterality: N/A;  . TONSILLECTOMY       Home Meds: Prior to Admission medications   Medication Sig Start Date  End Date Taking? Authorizing Provider  apixaban (ELIQUIS) 5 MG TABS tablet Take 10 mg (2 tabs) twice a day till 05/22/16 and then from 05/23/16 take 1 tab (5 mg ) two times a day 05/17/16  Yes Fritzi Mandes, MD  aspirin 81 MG tablet Take by mouth daily.    Yes [provider]  escitalopram (LEXAPRO) 10 MG tablet Take 1 tablet (10 mg total) by mouth 2  (two) times daily. 10/15/16  Yes Opalski, Deborah, DO  montelukast (SINGULAIR) 10 MG tablet Take 1 tablet (10 mg total) by mouth at bedtime. 08/14/16  Yes Danford, Valetta Fuller D, NP  torsemide (DEMADEX) 20 MG tablet Take 2 tablets by mouth 2 (two) times daily. 01/10/17  Yes [provider]  albuterol (PROVENTIL HFA;VENTOLIN HFA) 108 (90 BASE) MCG/ACT inhaler Inhale 2 puffs into the lungs every 6 (six) hours as needed. Wheezing or shortness of breath    [provider]  bisoprolol (ZEBETA) 10 MG tablet Take 0.5 tablets (5 mg total) by mouth daily. 10/15/16 10/31/16  Opalski, Deborah, DO  fluticasone (FLONASE) 50 MCG/ACT nasal spray Place 2 sprays into the nose daily. Nasal congestion Rarely used     [provider]  isosorbide mononitrate (IMDUR) 30 MG 24 hr tablet Take 1 tablet (30 mg total) by mouth daily. Patient not taking: Reported on 01/12/2017 04/30/16   Fritzi Mandes, MD  nitroGLYCERIN (NITROSTAT) 0.4 MG SL tablet Place under the tongue every 5 (five) minutes as needed for chest pain.     [provider]    Inpatient Medications:  . bisoprolol  5 mg Oral Daily  . escitalopram  10 mg Oral BID  . fluticasone  2 spray Each Nare Daily  . furosemide  40 mg Intravenous Q12H  . montelukast  10 mg Oral QHS  . potassium chloride  20 mEq Oral Daily  . sodium chloride flush  3 mL Intravenous Q12H   . sodium chloride      Allergies:  Allergies  Allergen Reactions  . Fenofibrate Other (See Comments)    Myalgias/gi upset   . Statins Other (See Comments)    Myalgias   . Sulfonamide Derivatives Nausea And Vomiting    REACTION: nausea    Social History   Socioeconomic History  . Marital status: Widowed    Spouse name: Not on file  . Number of children: Not on file  . Years of education: Not on file  . Highest education level: Not on file  Social Needs  . Financial resource strain: Not on file  . Food insecurity - worry: Not on file  . Food insecurity -  inability: Not on file  . Transportation needs - medical: Not on file  . Transportation needs - non-medical: Not on file  Occupational History  . Occupation: Retired  Tobacco Use  . Smoking status: Former Smoker    Packs/day: 0.25    Years: 20.00    Pack years: 5.00    Types: Cigarettes    Last attempt to quit: 01/02/1971    Years since quitting: 46.0  . Smokeless tobacco: Never Used  . Tobacco comment: smoked a few cigarettes/day x 20 yrs - quit @ age 61.  Substance and Sexual Activity  . Alcohol use: No  . Drug use: No  . Sexual activity: Not Currently  Other Topics Concern  . Not on file  Social History Narrative   Lives in [place] by herself.  She has family near-by.  She tries to stay active around  the house - does all of her own housework and still mows (rides) her yard.      Daily caffeine      Family History  Problem Relation Age of Onset  . Colon cancer Sister   . Melanoma Sister   . Hyperlipidemia Brother   . Hypertension Mother   . Lupus Mother        died 29 from surgical complications  . Alzheimer's disease Brother        unknown  . Kidney disease Father        died 8  . Hypertension Father   . Alcohol abuse Father      Review of Systems Positive for shortness of breath edema Negative for: General:  chills, fever, night sweats or positive for weight changes.  Cardiovascular: PND orthopnea syncope dizziness  Dermatological skin lesions rashes Respiratory: Cough congestion Urologic: Frequent urination urination at night and hematuria Abdominal: negative for nausea, vomiting, diarrhea, bright red blood per rectum, melena, or hematemesis Neurologic: negative for visual changes, and/or hearing changes  All other systems reviewed and are otherwise negative except as noted above.  Labs: Recent Labs    01/12/17 1023 01/12/17 1527  TROPONINI 0.12* 0.11*   Lab Results  Component Value Date   WBC 8.0 01/13/2017   HGB 11.6 (L) 01/13/2017   HCT 37.3  01/13/2017   MCV 71.3 (L) 01/13/2017   PLT 166 01/13/2017    Recent Labs  Lab 01/12/17 1023 01/13/17 0440  NA 134* 136  K 3.2* 4.0  CL 99* 103  CO2 24 23  BUN 38* 40*  CREATININE 1.39* 1.60*  CALCIUM 10.1 10.1  PROT 7.1  --   BILITOT 2.4* 2.0*  ALKPHOS 81  --   ALT 15  --   AST 29  --   GLUCOSE 139* 119*   Lab Results  Component Value Date   CHOL 218 (H) 04/27/2016   HDL 44 04/27/2016   LDLCALC 146 (H) 04/27/2016   TRIG 141 04/27/2016   No results found for: DDIMER  Radiology/Studies:  US Venous Img Lower Unilateral Right  Result Date: 12/18/2016 CLINICAL DATA:  84 year old female with right leg swelling EXAM: RIGHT LOWER EXTREMITY VENOUS DOPPLER ULTRASOUND TECHNIQUE: Gray-scale sonography with graded compression, as well as color Doppler and duplex ultrasound were performed to evaluate the lower extremity deep venous systems from the level of the common femoral vein and including the common femoral, femoral, profunda femoral, popliteal and calf veins including the posterior tibial, peroneal and gastrocnemius veins when visible. The superficial great saphenous vein was also interrogated. Spectral Doppler was utilized to evaluate flow at rest and with distal augmentation maneuvers in the common femoral, femoral and popliteal veins. COMPARISON:  05/15/2016 FINDINGS: Contralateral Common Femoral Vein: Respiratory phasicity is normal and symmetric with the symptomatic side. No evidence of thrombus. Normal compressibility. Common Femoral Vein: No evidence of thrombus. Normal compressibility, respiratory phasicity and response to augmentation. Saphenofemoral Junction: No evidence of thrombus. Normal compressibility and flow on color Doppler imaging. Profunda Femoral Vein: No evidence of thrombus. Normal compressibility and flow on color Doppler imaging. Femoral Vein: No evidence of thrombus. Normal compressibility, respiratory phasicity and response to augmentation. Popliteal Vein: No  evidence of thrombus. Normal compressibility, respiratory phasicity and response to augmentation. Calf Veins: Re- demonstration of thrombus of right peroneal vein without proximal extension. Superficial Great Saphenous Vein: No evidence of thrombus. Normal compressibility and flow on color Doppler imaging. Other Findings: Heterogeneous fluid collection the right popliteal fossa  measures 5.6 cm x 1.1 cm x 2.1 cm. IMPRESSION: Sonographic survey negative for DVT of the common femoral, femoral, popliteal veins. Re- demonstration of thrombus of right peroneal vein, unchanged since study in May 2018. Right-sided Baker's cyst. Electronically Signed   By: Corrie Mckusick D.O.   On: 12/18/2016 11:31   Dg Chest Portable 1 View  Result Date: 01/12/2017 CLINICAL DATA:  Shortness of Breath EXAM: PORTABLE CHEST 1 VIEW COMPARISON:  07/12/2016 FINDINGS: Large left pleural effusion, worsened since prior study. Left lower lobe atelectasis. Cardiomegaly with vascular congestion. Suspect mild interstitial edema. IMPRESSION: Large left pleural effusion, increasing since prior study. Left lower lobe atelectasis. Suspect mild edema/CHF. Electronically Signed   By: Rolm Baptise M.D.   On: 01/12/2017 10:57   US Abdomen Limited Ruq  Result Date: 01/12/2017 CLINICAL DATA:  Elevated bilirubin level. EXAM: ULTRASOUND ABDOMEN LIMITED RIGHT UPPER QUADRANT COMPARISON:  06/17/2010 FINDINGS: Gallbladder: No gallstones or wall thickening visualized. No sonographic Murphy sign noted by sonographer. Common bile duct: Diameter: Normal, 4 mm Liver: No focal lesion identified. Within normal limits in parenchymal echogenicity. Portal vein is patent on color Doppler imaging with normal direction of blood flow towards the liver. Right pleural effusion. IMPRESSION: 1.  No acute process or explanation for elevated bilirubin level. 2. Right pleural effusion. Electronically Signed   By: Abigail Miyamoto M.D.   On: 01/12/2017 16:37    EKG: Atrial  fibrillation with controlled ventricular rate and left bundle branch block  Weights: Filed Weights   01/12/17 1000 01/12/17 1436 01/13/17 0310  Weight: 71.2 kg (157 lb) 69.9 kg (154 lb 1.6 oz) 70.6 kg (155 lb 9.6 oz)     Physical Exam: Blood pressure (!) 106/59, pulse 63, temperature (!) 97.5 F (36.4 C), resp. rate 18, height 5\' 3"  (1.6 m), weight 70.6 kg (155 lb 9.6 oz), SpO2 100 %. Body mass index is 27.56 kg/m. General: Well developed, well nourished, in no acute distress. Head eyes ears nose throat: Normocephalic, atraumatic, sclera non-icteric, no xanthomas, nares are without discharge. No apparent thyromegaly and/or mass  Lungs: Normal respiratory effort.  no wheezes, few basilar rales, no rhonchi.  Reduced breath sounds in the bases Heart: Irregular with normal S1 S2.  2+ apical murmur gallop, no rub, PMI is normal size and placement, carotid upstroke normal without bruit, jugular venous pressure is normal Abdomen: Soft, non-tender, non-distended with normoactive bowel sounds. No hepatomegaly. No rebound/guarding. No obvious abdominal masses. Abdominal aorta is normal size without bruit Extremities: 1+ edema. no cyanosis, no clubbing, no ulcers  Peripheral : 2+ bilateral upper extremity pulses, 2+ bilateral femoral pulses, 2+ bilateral dorsal pedal pulse Neuro: Alert and oriented. No facial asymmetry. No focal deficit. Moves all extremities spontaneously. Musculoskeletal: Normal muscle tone without kyphosis Psych:  Responds to questions appropriately with a normal affect.    Assessment: 84 year old female with acute on chronic systolic dysfunction congestive heart failure with demand ischemia elevated troponin myocardial infarction with known coronary artery disease essential hypertension mixed hyperlipidemia chronic kidney disease stage III now responding to intravenous Lasix  Plan: 1.  Continue intravenous Lasix watching closely for worsening chronic kidney disease for lower  extremity edema and acute on chronic systolic dysfunction congestive heart failure 2.  No further cardiac diagnostics necessary at this time due to known valvular heart disease and previous LV systolic dysfunction 3.  Oxygen supplementation for improvement of symptoms 4.  Avoid beta-blocker at this time due to slower heart rate 5.  We will reinstatement of other  diuretics for edema and shortness of breath and heart failure after ambulation  Signed, Corey Skains M.D. [facility] 01/13/2017, 8:39 AM

## 2017-01-13 NOTE — Progress Notes (Signed)
Pt wants the Lexapro given now instread of 10. Docotor was notified and states its okay to give the lexapro. Also inform docotr Sainani about her BP 99/58 but no new order was place at this time,. Will continue to monitor.

## 2017-01-14 ENCOUNTER — Inpatient Hospital Stay: Payer: Medicare HMO

## 2017-01-14 LAB — BODY FLUID CELL COUNT WITH DIFFERENTIAL
EOS FL: 0 %
Lymphs, Fluid: 35 %
Monocyte-Macrophage-Serous Fluid: 56 %
NEUTROPHIL FLUID: 9 %
OTHER CELLS FL: 0 %
Total Nucleated Cell Count, Fluid: 83 cu mm

## 2017-01-14 LAB — LACTATE DEHYDROGENASE, PLEURAL OR PERITONEAL FLUID: LD FL: 37 U/L — AB (ref 3–23)

## 2017-01-14 LAB — GLUCOSE, PLEURAL OR PERITONEAL FLUID: Glucose, Fluid: 117 mg/dL

## 2017-01-14 LAB — BASIC METABOLIC PANEL
ANION GAP: 11 (ref 5–15)
BUN: 50 mg/dL — ABNORMAL HIGH (ref 6–20)
CO2: 23 mmol/L (ref 22–32)
Calcium: 10.1 mg/dL (ref 8.9–10.3)
Chloride: 100 mmol/L — ABNORMAL LOW (ref 101–111)
Creatinine, Ser: 1.9 mg/dL — ABNORMAL HIGH (ref 0.44–1.00)
GFR, EST AFRICAN AMERICAN: 25 mL/min — AB (ref >60)
GFR, EST NON AFRICAN AMERICAN: 22 mL/min — AB (ref >60)
Glucose, Bld: 124 mg/dL — ABNORMAL HIGH (ref 65–99)
POTASSIUM: 4.3 mmol/L (ref 3.5–5.1)
SODIUM: 134 mmol/L — AB (ref 135–145)

## 2017-01-14 LAB — AMYLASE, PLEURAL OR PERITONEAL FLUID: Amylase, Fluid: 58 U/L

## 2017-01-14 LAB — ALBUMIN, PLEURAL OR PERITONEAL FLUID: Albumin, Fluid: 1.4 g/dL

## 2017-01-14 LAB — PROTEIN, PLEURAL OR PERITONEAL FLUID: Total protein, fluid: <3 g/dL

## 2017-01-14 LAB — MRSA PCR SCREENING: MRSA BY PCR: NEGATIVE

## 2017-01-14 MED ORDER — FUROSEMIDE 10 MG/ML IJ SOLN
40.0000 mg | Freq: Every day | INTRAMUSCULAR | Status: DC
  Administered 2017-01-15: 40 mg via INTRAVENOUS
  Filled 2017-01-14: qty 4

## 2017-01-14 NOTE — Plan of Care (Signed)
Pt is A&Ox4. VSS. 2L O2 Bath continued. Family at bedside. OOB with assist. Pt went for thoracentesis today. No complaints thus far. Will continue to monitor and report to oncoming RN .  Progressing Education: Knowledge of General Education information will improve 01/14/2017 1342 - Progressing by Aleen Campi, RN Health Behavior/Discharge Planning: Ability to manage health-related needs will improve 01/14/2017 1342 - Progressing by Aleen Campi, RN Clinical Measurements: Ability to maintain clinical measurements within normal limits will improve 01/14/2017 1342 - Progressing by Aleen Campi, RN Will remain free from infection 01/14/2017 1342 - Progressing by Aleen Campi, RN Diagnostic test results will improve 01/14/2017 1342 - Progressing by Aleen Campi, RN Respiratory complications will improve 01/14/2017 1342 - Progressing by Aleen Campi, RN Cardiovascular complication will be avoided 01/14/2017 1342 - Progressing by Aleen Campi, RN Activity: Risk for activity intolerance will decrease 01/14/2017 1342 - Progressing by Aleen Campi, RN Nutrition: Adequate nutrition will be maintained 01/14/2017 1342 - Progressing by Aleen Campi, RN Coping: Level of anxiety will decrease 01/14/2017 1342 - Progressing by Aleen Campi, RN Elimination: Will not experience complications related to bowel motility 01/14/2017 1342 - Progressing by Aleen Campi, RN Will not experience complications related to urinary retention 01/14/2017 1342 - Progressing by Aleen Campi, RN Pain Managment: General experience of comfort will improve 01/14/2017 1342 - Progressing by Aleen Campi, RN Safety: Ability to remain free from injury will improve 01/14/2017 1342 - Progressing by Aleen Campi, RN Skin Integrity: Risk for impaired skin integrity will decrease 01/14/2017 1342 - Progressing by Aleen Campi, RN Education: Ability to demonstrate management of disease process will improve 01/14/2017 1342 -  Progressing by Aleen Campi, RN Ability to verbalize understanding of medication therapies will improve 01/14/2017 1342 - Progressing by Aleen Campi, RN Activity: Capacity to carry out activities will improve 01/14/2017 1342 - Progressing by Aleen Campi, RN Cardiac: Ability to achieve and maintain adequate cardiopulmonary perfusion will improve 01/14/2017 1342 - Progressing by Aleen Campi, RN

## 2017-01-14 NOTE — Progress Notes (Signed)
[facility] NAME: Wanda Davenport    MR#:  [id number]  DATE OF BIRTH:  Aug 28, 1923  SUBJECTIVE:   Pt. Here due to shortness of breath and a large left-sided pleural effusion. S/p US guided thoracentesis today with 1L fluid removed. Cr. Improving and will lower IV Lasix dose.   REVIEW OF SYSTEMS:    Review of Systems  Constitutional: Negative for chills and fever.  HENT: Negative for congestion and tinnitus.   Eyes: Negative for blurred vision and double vision.  Respiratory: Positive for shortness of breath. Negative for cough and wheezing.   Cardiovascular: Negative for chest pain, orthopnea and PND.  Gastrointestinal: Negative for abdominal pain, diarrhea, nausea and vomiting.  Genitourinary: Negative for dysuria and hematuria.  Neurological: Positive for weakness. Negative for dizziness, sensory change and focal weakness.  All other systems reviewed and are negative.    Nutrition: Heart Healthy Tolerating Diet: Yes Tolerating PT: Await Eval.   DRUG ALLERGIES:   Allergies  Allergen Reactions  . Fenofibrate Other (See Comments)    Myalgias/gi upset   . Statins Other (See Comments)    Myalgias   . Sulfonamide Derivatives Nausea And Vomiting    REACTION: nausea    VITALS:  Blood pressure (!) 83/50, pulse (!) 57, temperature 98.3 F (36.8 C), resp. rate 18, height 5\' 3"  (1.6 m), weight 67.4 kg (148 lb 9.6 oz), SpO2 100 %.  PHYSICAL EXAMINATION:   Physical Exam  GENERAL:  84 y.o.-year-old patient lying in bed in no acute distress.  EYES: Pupils equal, round, reactive to light and accommodation. No scleral icterus. Extraocular muscles intact.  HEENT: Head atraumatic, normocephalic. Oropharynx and nasopharynx clear.  NECK:  Supple, no jugular venous distention. No thyroid enlargement, no tenderness.  LUNGS: Good A/E b/l, no wheezing, rales, rhonchi. No use of accessory muscles of respiration.  CARDIOVASCULAR: S1, S2  normal. No murmurs, rubs, or gallops.  ABDOMEN: Soft, nontender, nondistended. Bowel sounds present. No organomegaly or mass.  EXTREMITIES: No cyanosis, clubbing, + 2 edema b/l.  NEUROLOGIC: Cranial nerves II through XII are intact. No focal Motor or sensory deficits b/l.  Globally weak.  PSYCHIATRIC: The patient is alert and oriented x 3.  SKIN: No obvious rash, lesion, or ulcer.    LABORATORY PANEL:   CBC Recent Labs  Lab 01/13/17 0440  WBC 8.0  HGB 11.6*  HCT 37.3  PLT 166   ------------------------------------------------------------------------------------------------------------------  Chemistries  Recent Labs  Lab 01/12/17 1023 01/13/17 0440 01/14/17 0457  NA 134* 136 134*  K 3.2* 4.0 4.3  CL 99* 103 100*  CO2 24 23 23   GLUCOSE 139* 119* 124*  BUN 38* 40* 50*  CREATININE 1.39* 1.60* 1.90*  CALCIUM 10.1 10.1 10.1  MG 2.2  --   --   AST 29  --   --   ALT 15  --   --   ALKPHOS 81  --   --   BILITOT 2.4* 2.0*  --    ------------------------------------------------------------------------------------------------------------------  Cardiac Enzymes Recent Labs  Lab 01/12/17 1527  TROPONINI 0.11*   ------------------------------------------------------------------------------------------------------------------  RADIOLOGY:  Dg Chest 1 View  Result Date: 01/14/2017 CLINICAL DATA:  Post left thoracentesis EXAM: CHEST 1 VIEW COMPARISON:  01/12/2017 FINDINGS: Large left pleural effusion remains, slightly decreased since prior study. No pneumothorax. Left lower lobe atelectasis. No effusion or confluent opacity on the right. Mild cardiomegaly. IMPRESSION: Decreasing size of the large left pleural effusion. No pneumothorax. Electronically Signed  By: Rolm Baptise M.D.   On: 01/14/2017 12:13   US Abdomen Limited Ruq  Result Date: 01/12/2017 CLINICAL DATA:  Elevated bilirubin level. EXAM: ULTRASOUND ABDOMEN LIMITED RIGHT UPPER QUADRANT COMPARISON:  06/17/2010  FINDINGS: Gallbladder: No gallstones or wall thickening visualized. No sonographic Murphy sign noted by sonographer. Common bile duct: Diameter: Normal, 4 mm Liver: No focal lesion identified. Within normal limits in parenchymal echogenicity. Portal vein is patent on color Doppler imaging with normal direction of blood flow towards the liver. Right pleural effusion. IMPRESSION: 1.  No acute process or explanation for elevated bilirubin level. 2. Right pleural effusion. Electronically Signed   By: Abigail Miyamoto M.D.   On: 01/12/2017 16:37     ASSESSMENT AND PLAN:   84 year old female with past medical history hypertension, hyperlipidemia, coronary artery disease, chronic systolic CHF, anxiety who presents to the hospital due to worsening lower extremity edema and shortness of breath and noted to be in congestive heart failure.  1. CHF-acute on chronic systolic dysfunction. - will cont. IV Lasix but lower dose as Cr. Is trending up.  -  follow I's and O's and daily weights. Patient had a large left-sided pleural effusion on chest x-ray yesterday and is s/p Left sided Thoracentesis with 1 L of fluid removed.   2. Left-sided pleural effusion-suspected to be secondary to CHF. Continue diuresis with IV Lasix - s/p Thoracentesis and 1 litre of fluid removed. Await fluid studies.  - will repeat CXR in a.m.   3. Essential hypertension-continue bisoprolol  4. Depression-continue Lexapro, Celexa.  5. History of previous DVT- will resume Eliquis in a.m. Tomorrow.   All the records are reviewed and case discussed with Care Management/Social Worker. Management plans discussed with the patient, family and they are in agreement.  CODE STATUS: Full code  DVT Prophylaxis: Ted's & SCD's.   TOTAL TIME TAKING CARE OF THIS PATIENT: 30 minutes.   POSSIBLE D/C IN 1-2 DAYS, DEPENDING ON CLINICAL CONDITION.   Henreitta Leber M.D on 01/14/2017 at 2:10 PM  Between 7am to 6pm - Pager - [phone]  After 6pm  go to [email or web address] - [employer] [facility]  Office  [phone]  CC: Primary care physician; Mellody Dance, DO

## 2017-01-14 NOTE — Progress Notes (Signed)
[facility] Encounter Note  Patient: Wanda Davenport / Admit Date: 01/12/2017 / Date of Encounter: 01/14/2017, 7:12 AM   Subjective: Patient is breathing better than yesterday. Patient has less lower extremity edema. Improved acute on chronic systolic dysfunction heart failure with the re-addition of beta blocker. Chronic kidney disease worsening with intravenous Lasix. Elevation of troponin most consistent with demand ischemia rather than acute coronary syndrome  Review of Systems: Positive for: Shortness of breath Negative for: Vision change, hearing change, syncope, dizziness, nausea, vomiting,diarrhea, bloody stool, stomach pain, cough, congestion, diaphoresis, urinary frequency, urinary pain,skin lesions, skin rashes Others previously listed  Objective: Telemetry: Atrial fibrillation with controlled ventricular rate Physical Exam: Blood pressure 107/77, pulse 61, temperature 98.3 F (36.8 C), resp. rate 17, height 5\' 3"  (1.6 m), weight 67.4 kg (148 lb 9.6 oz), SpO2 100 %. Body mass index is 26.32 kg/m. General: Well developed, well nourished, in no acute distress. Head: Normocephalic, atraumatic, sclera non-icteric, no xanthomas, nares are without discharge. Neck: No apparent masses Lungs: Normal respirations with no wheezes, no rhonchi, no rales , few basilar crackles   Heart: Irregular rate and rhythm, normal S1 S2, no murmur, no rub, no gallop, PMI is normal size and placement, carotid upstroke normal without bruit, jugular venous pressure normal Abdomen: Soft, non-tender, non-distended with normoactive bowel sounds. No hepatosplenomegaly. Abdominal aorta is normal size without bruit Extremities: Trace to 1+ edema, no clubbing, no cyanosis, no ulcers,  Peripheral: 2+ radial, 2+ femoral, 2+ dorsal pedal pulses Neuro: Alert and oriented. Moves all extremities spontaneously. Psych:  Responds to questions appropriately with a normal affect.   Intake/Output  Summary (Last 24 hours) at 01/14/2017 5284 Last data filed at 01/14/2017 0300 Gross per 24 hour  Intake 1080 ml  Output 150 ml  Net 930 ml    Inpatient Medications:  . bisoprolol  5 mg Oral Daily  . escitalopram  10 mg Oral BID  . fluticasone  2 spray Each Nare Daily  . furosemide  40 mg Intravenous Q12H  . montelukast  10 mg Oral QHS  . potassium chloride  20 mEq Oral Daily  . sodium chloride flush  3 mL Intravenous Q12H   Infusions:  . sodium chloride      Labs: Recent Labs    01/12/17 1023 01/13/17 0440 01/14/17 0457  NA 134* 136 134*  K 3.2* 4.0 4.3  CL 99* 103 100*  CO2 24 23 23   GLUCOSE 139* 119* 124*  BUN 38* 40* 50*  CREATININE 1.39* 1.60* 1.90*  CALCIUM 10.1 10.1 10.1  MG 2.2  --   --    Recent Labs    01/12/17 1023 01/13/17 0440  AST 29  --   ALT 15  --   ALKPHOS 81  --   BILITOT 2.4* 2.0*  PROT 7.1  --   ALBUMIN 4.2  --    Recent Labs    01/12/17 1023 01/13/17 0440  WBC 8.7 8.0  NEUTROABS 6.8*  --   HGB 11.7* 11.6*  HCT 37.8 37.3  MCV 71.7* 71.3*  PLT 185 166   Recent Labs    01/12/17 1023 01/12/17 1527  TROPONINI 0.12* 0.11*   Invalid input(s): POCBNP No results for input(s): HGBA1C in the last 72 hours.   Weights: Filed Weights   01/12/17 1436 01/13/17 0310 01/14/17 0500  Weight: 69.9 kg (154 lb 1.6 oz) 70.6 kg (155 lb 9.6 oz) 67.4 kg (148 lb 9.6 oz)     Radiology/Studies:  US  Venous Img Lower Unilateral Right  Result Date: 12/18/2016 CLINICAL DATA:  84 year old female with right leg swelling EXAM: RIGHT LOWER EXTREMITY VENOUS DOPPLER ULTRASOUND TECHNIQUE: Gray-scale sonography with graded compression, as well as color Doppler and duplex ultrasound were performed to evaluate the lower extremity deep venous systems from the level of the common femoral vein and including the common femoral, femoral, profunda femoral, popliteal and calf veins including the posterior tibial, peroneal and gastrocnemius veins when visible. The  superficial great saphenous vein was also interrogated. Spectral Doppler was utilized to evaluate flow at rest and with distal augmentation maneuvers in the common femoral, femoral and popliteal veins. COMPARISON:  05/15/2016 FINDINGS: Contralateral Common Femoral Vein: Respiratory phasicity is normal and symmetric with the symptomatic side. No evidence of thrombus. Normal compressibility. Common Femoral Vein: No evidence of thrombus. Normal compressibility, respiratory phasicity and response to augmentation. Saphenofemoral Junction: No evidence of thrombus. Normal compressibility and flow on color Doppler imaging. Profunda Femoral Vein: No evidence of thrombus. Normal compressibility and flow on color Doppler imaging. Femoral Vein: No evidence of thrombus. Normal compressibility, respiratory phasicity and response to augmentation. Popliteal Vein: No evidence of thrombus. Normal compressibility, respiratory phasicity and response to augmentation. Calf Veins: Re- demonstration of thrombus of right peroneal vein without proximal extension. Superficial Great Saphenous Vein: No evidence of thrombus. Normal compressibility and flow on color Doppler imaging. Other Findings: Heterogeneous fluid collection the right popliteal fossa measures 5.6 cm x 1.1 cm x 2.1 cm. IMPRESSION: Sonographic survey negative for DVT of the common femoral, femoral, popliteal veins. Re- demonstration of thrombus of right peroneal vein, unchanged since study in May 2018. Right-sided Baker's cyst. Electronically Signed   By: Corrie Mckusick D.O.   On: 12/18/2016 11:31   Dg Chest Portable 1 View  Result Date: 01/12/2017 CLINICAL DATA:  Shortness of Breath EXAM: PORTABLE CHEST 1 VIEW COMPARISON:  07/12/2016 FINDINGS: Large left pleural effusion, worsened since prior study. Left lower lobe atelectasis. Cardiomegaly with vascular congestion. Suspect mild interstitial edema. IMPRESSION: Large left pleural effusion, increasing since prior study. Left  lower lobe atelectasis. Suspect mild edema/CHF. Electronically Signed   By: Rolm Baptise M.D.   On: 01/12/2017 10:57   US Abdomen Limited Ruq  Result Date: 01/12/2017 CLINICAL DATA:  Elevated bilirubin level. EXAM: ULTRASOUND ABDOMEN LIMITED RIGHT UPPER QUADRANT COMPARISON:  06/17/2010 FINDINGS: Gallbladder: No gallstones or wall thickening visualized. No sonographic Murphy sign noted by sonographer. Common bile duct: Diameter: Normal, 4 mm Liver: No focal lesion identified. Within normal limits in parenchymal echogenicity. Portal vein is patent on color Doppler imaging with normal direction of blood flow towards the liver. Right pleural effusion. IMPRESSION: 1.  No acute process or explanation for elevated bilirubin level. 2. Right pleural effusion. Electronically Signed   By: Abigail Miyamoto M.D.   On: 01/12/2017 16:37     Assessment and Recommendation  84 y.o. female with acute on chronic systolic dysfunction congestive heart failure essential hypertension mixed hyperlipidemia chronic nonvalvular atrial fibrillation improved with intravenous Lasix 1. Change Lasix to oral medication management and watching closely for worsening chronic kidney disease 2. Continue beta blocker for heart rate control and LV systolic dysfunction 3. No anticoagulation due to patient and family wishes and fall risk 4. No further cardiac diagnostics necessary at this time 5. No further intervention of elevated troponin consistent with demand ischemia rather than acute coronary syndrome 6. Ambulation and follow for improvements of symptoms and possible discharge home today with follow-up next week  Signed, Serafina Royals  M.D. FACC

## 2017-01-14 NOTE — Procedures (Signed)
PROCEDURE SUMMARY:  Successful US guided left diagnostic and therapeutic thoracentesis. Yielded 1.0 liters of amber fluid. Pt tolerated procedure well, however procedure was stopped due to hypotension and development of cough.  No immediate complications.  Specimen was sent for labs. CXR ordered.  Docia Barrier PA-C 01/14/2017 11:48 AM

## 2017-01-14 NOTE — Care Management Note (Signed)
Case Management Note  Patient Details  Name: Wanda Davenport MRN: [id number] Date of Birth: 1923-06-27  Subjective/Objective:                 Presents from home with increasing shortness of breath, lower extremity swelling and weight gain. She has chronic home oxygen through [facility].  It is documented she is on .5 liter continuous at home but the order is for 2 liters.  She has been followed by [facility] in the past.  Had thoracentesis today with removal of 1 liter. Discussed mobility during progression.  Action/Plan:   Expected Discharge Date:                  Expected Discharge Plan:     In-House Referral:     Discharge planning Services     Post Acute Care Choice:    Choice offered to:     DME Arranged:    DME Agency:     HH Arranged:    HH Agency:     Status of Service:     If discussed at [employer] of Stay Meetings, dates discussed:    Additional Comments:  Katrina Stack, RN 01/14/2017, 3:53 PM

## 2017-01-14 NOTE — Progress Notes (Signed)
Provided patient with "Living Better with Heart Failure" packet. Briefly reviewed definition of heart failure and signs and symptoms of an exacerbation. Reviewed importance of and reason behind checking weight daily in the AM, after using the bathroom, but before getting dressed. Discussed when to call the Dr= weight gain of >2lb overnight of 5lb in a week,  Discussed yellow zone= call MD: weight gain of >2lb overnight of 5lb in a week, increased swelling, increased SOB when lying down, chest discomfort, dizziness, increased fatigue Red Zone= call 911: struggle to breath, fainting or near fainting, significant chest pain Reviewed low sodium diet <2g/day-provided handout of recommended and not recommended foods  Fluid restriction <2L/day Reviewed how to read nutrition label Reviewed medication (torsemide, bisoprolol) Explained briefly why pt is on the medications (either make you feel better, live longer or keep you out of the hospital) and discussed monitoring and side effects Discussed exercise: as able

## 2017-01-15 ENCOUNTER — Inpatient Hospital Stay: Payer: Medicare HMO

## 2017-01-15 LAB — TRIGLYCERIDES, BODY FLUIDS: TRIGLYCERIDES FL: 17 mg/dL

## 2017-01-15 LAB — BASIC METABOLIC PANEL
Anion gap: 11 (ref 5–15)
BUN: 52 mg/dL — AB (ref 6–20)
CALCIUM: 10 mg/dL (ref 8.9–10.3)
CO2: 22 mmol/L (ref 22–32)
Chloride: 103 mmol/L (ref 101–111)
Creatinine, Ser: 1.87 mg/dL — ABNORMAL HIGH (ref 0.44–1.00)
GFR calc non Af Amer: 22 mL/min — ABNORMAL LOW (ref >60)
GFR, EST AFRICAN AMERICAN: 26 mL/min — AB (ref >60)
Glucose, Bld: 99 mg/dL (ref 65–99)
Potassium: 4.6 mmol/L (ref 3.5–5.1)
SODIUM: 136 mmol/L (ref 135–145)

## 2017-01-15 LAB — PROTEIN, BODY FLUID (OTHER): TOTAL PROTEIN, BODY FLUID OTHER: 2 g/dL

## 2017-01-15 NOTE — Plan of Care (Signed)
Pt is A&Ox4. VSS. 2L O2 [place] continued. OOB with assist. Family at bedside. No complaints thus far. Will continue to monitor and report to oncoming RN .  Progressing Education: Knowledge of General Education information will improve 01/15/2017 1347 - Progressing by Aleen Campi, RN Health Behavior/Discharge Planning: Ability to manage health-related needs will improve 01/15/2017 1347 - Progressing by Aleen Campi, RN Clinical Measurements: Ability to maintain clinical measurements within normal limits will improve 01/15/2017 1347 - Progressing by Aleen Campi, RN Will remain free from infection 01/15/2017 1347 - Progressing by Aleen Campi, RN Diagnostic test results will improve 01/15/2017 1347 - Progressing by Aleen Campi, RN Respiratory complications will improve 01/15/2017 1347 - Progressing by Aleen Campi, RN Cardiovascular complication will be avoided 01/15/2017 1347 - Progressing by Aleen Campi, RN Activity: Risk for activity intolerance will decrease 01/15/2017 1347 - Progressing by Aleen Campi, RN Nutrition: Adequate nutrition will be maintained 01/15/2017 1347 - Progressing by Aleen Campi, RN Coping: Level of anxiety will decrease 01/15/2017 1347 - Progressing by Aleen Campi, RN Elimination: Will not experience complications related to bowel motility 01/15/2017 1347 - Progressing by Aleen Campi, RN Will not experience complications related to urinary retention 01/15/2017 1347 - Progressing by Aleen Campi, RN Pain Managment: General experience of comfort will improve 01/15/2017 1347 - Progressing by Aleen Campi, RN Safety: Ability to remain free from injury will improve 01/15/2017 1347 - Progressing by Aleen Campi, RN Skin Integrity: Risk for impaired skin integrity will decrease 01/15/2017 1347 - Progressing by Aleen Campi, RN Education: Ability to demonstrate management of disease process will improve 01/15/2017 1347 - Progressing by Aleen Campi,  RN Ability to verbalize understanding of medication therapies will improve 01/15/2017 1347 - Progressing by Aleen Campi, RN Activity: Capacity to carry out activities will improve 01/15/2017 1347 - Progressing by Aleen Campi, RN Cardiac: Ability to achieve and maintain adequate cardiopulmonary perfusion will improve 01/15/2017 1347 - Progressing by Aleen Campi, RN

## 2017-01-15 NOTE — Progress Notes (Signed)
[facility] Cardiology [facility] Encounter Note  Patient: Wanda Davenport / Admit Date: 01/12/2017 / Date of Encounter: 01/15/2017, 5:23 AM   Subjective: Patient is breathing better than yesterday.  Patient has slept well overnight with no evidence of worsening symptoms patient has less lower extremity edema. Improved acute on chronic systolic dysfunction heart failure with the re-addition of diuretic and beta blocker. Chronic kidney disease worsening with intravenous Lasix. Elevation of troponin most consistent with demand ischemia rather than acute coronary syndrome  Review of Systems: Positive for: Shortness of breath Negative for: Vision change, hearing change, syncope, dizziness, nausea, vomiting,diarrhea, bloody stool, stomach pain, cough, congestion, diaphoresis, urinary frequency, urinary pain,skin lesions, skin rashes Others previously listed  Objective: Telemetry: Atrial fibrillation with controlled ventricular rate Physical Exam: Blood pressure (!) 118/52, pulse (!) 56, temperature 97.8 F (36.6 C), temperature source Oral, resp. rate 18, height 5\' 3"  (1.6 m), weight 69.9 kg (154 lb), SpO2 100 %. Body mass index is 27.28 kg/m. General: Well developed, well nourished, in no acute distress. Head: Normocephalic, atraumatic, sclera non-icteric, no xanthomas, nares are without discharge. Neck: No apparent masses Lungs: Normal respirations with no wheezes, no rhonchi, no rales , few basilar crackles   Heart: Irregular rate and rhythm, normal S1 S2, no murmur, no rub, no gallop, PMI is normal size and placement, carotid upstroke normal without bruit, jugular venous pressure normal Abdomen: Soft, non-tender, non-distended with normoactive bowel sounds. No hepatosplenomegaly. Abdominal aorta is normal size without bruit Extremities: Trace to 1+ edema, no clubbing, no cyanosis, no ulcers,  Peripheral: 2+ radial, 2+ femoral, 2+ dorsal pedal pulses Neuro: Alert and oriented. Moves all  extremities spontaneously. Psych:  Responds to questions appropriately with a normal affect.   Intake/Output Summary (Last 24 hours) at 01/15/2017 0523 Last data filed at 01/15/2017 0214 Gross per 24 hour  Intake 480 ml  Output 400 ml  Net 80 ml    Inpatient Medications:  . bisoprolol  5 mg Oral Daily  . escitalopram  10 mg Oral BID  . fluticasone  2 spray Each Nare Daily  . furosemide  40 mg Intravenous Daily  . montelukast  10 mg Oral QHS  . potassium chloride  20 mEq Oral Daily  . sodium chloride flush  3 mL Intravenous Q12H   Infusions:  . sodium chloride      Labs: Recent Labs    01/12/17 1023 01/13/17 0440 01/14/17 0457  NA 134* 136 134*  K 3.2* 4.0 4.3  CL 99* 103 100*  CO2 24 23 23   GLUCOSE 139* 119* 124*  BUN 38* 40* 50*  CREATININE 1.39* 1.60* 1.90*  CALCIUM 10.1 10.1 10.1  MG 2.2  --   --    Recent Labs    01/12/17 1023 01/13/17 0440  AST 29  --   ALT 15  --   ALKPHOS 81  --   BILITOT 2.4* 2.0*  PROT 7.1  --   ALBUMIN 4.2  --    Recent Labs    01/12/17 1023 01/13/17 0440  WBC 8.7 8.0  NEUTROABS 6.8*  --   HGB 11.7* 11.6*  HCT 37.8 37.3  MCV 71.7* 71.3*  PLT 185 166   Recent Labs    01/12/17 1023 01/12/17 1527  TROPONINI 0.12* 0.11*   Invalid input(s): POCBNP No results for input(s): HGBA1C in the last 72 hours.   Weights: Filed Weights   01/13/17 0310 01/14/17 0500 01/14/17 1934  Weight: 70.6 kg (155 lb 9.6 oz) 67.4 kg (  148 lb 9.6 oz) 69.9 kg (154 lb)     Radiology/Studies:  Dg Chest 1 View  Result Date: 01/14/2017 CLINICAL DATA:  Post left thoracentesis EXAM: CHEST 1 VIEW COMPARISON:  01/12/2017 FINDINGS: Large left pleural effusion remains, slightly decreased since prior study. No pneumothorax. Left lower lobe atelectasis. No effusion or confluent opacity on the right. Mild cardiomegaly. IMPRESSION: Decreasing size of the large left pleural effusion. No pneumothorax. Electronically Signed   By: Rolm Baptise M.D.   On:  01/14/2017 12:13   US Venous Img Lower Unilateral Right  Result Date: 12/18/2016 CLINICAL DATA:  84 year old female with right leg swelling EXAM: RIGHT LOWER EXTREMITY VENOUS DOPPLER ULTRASOUND TECHNIQUE: Gray-scale sonography with graded compression, as well as color Doppler and duplex ultrasound were performed to evaluate the lower extremity deep venous systems from the level of the common femoral vein and including the common femoral, femoral, profunda femoral, popliteal and calf veins including the posterior tibial, peroneal and gastrocnemius veins when visible. The superficial great saphenous vein was also interrogated. Spectral Doppler was utilized to evaluate flow at rest and with distal augmentation maneuvers in the common femoral, femoral and popliteal veins. COMPARISON:  05/15/2016 FINDINGS: Contralateral Common Femoral Vein: Respiratory phasicity is normal and symmetric with the symptomatic side. No evidence of thrombus. Normal compressibility. Common Femoral Vein: No evidence of thrombus. Normal compressibility, respiratory phasicity and response to augmentation. Saphenofemoral Junction: No evidence of thrombus. Normal compressibility and flow on color Doppler imaging. Profunda Femoral Vein: No evidence of thrombus. Normal compressibility and flow on color Doppler imaging. Femoral Vein: No evidence of thrombus. Normal compressibility, respiratory phasicity and response to augmentation. Popliteal Vein: No evidence of thrombus. Normal compressibility, respiratory phasicity and response to augmentation. Calf Veins: Re- demonstration of thrombus of right peroneal vein without proximal extension. Superficial Great Saphenous Vein: No evidence of thrombus. Normal compressibility and flow on color Doppler imaging. Other Findings: Heterogeneous fluid collection the right popliteal fossa measures 5.6 cm x 1.1 cm x 2.1 cm. IMPRESSION: Sonographic survey negative for DVT of the common femoral, femoral,  popliteal veins. Re- demonstration of thrombus of right peroneal vein, unchanged since study in May 2018. Right-sided Baker's cyst. Electronically Signed   By: Corrie Mckusick D.O.   On: 12/18/2016 11:31   Dg Chest Portable 1 View  Result Date: 01/12/2017 CLINICAL DATA:  Shortness of Breath EXAM: PORTABLE CHEST 1 VIEW COMPARISON:  07/12/2016 FINDINGS: Large left pleural effusion, worsened since prior study. Left lower lobe atelectasis. Cardiomegaly with vascular congestion. Suspect mild interstitial edema. IMPRESSION: Large left pleural effusion, increasing since prior study. Left lower lobe atelectasis. Suspect mild edema/CHF. Electronically Signed   By: Rolm Baptise M.D.   On: 01/12/2017 10:57   US Abdomen Limited Ruq  Result Date: 01/12/2017 CLINICAL DATA:  Elevated bilirubin level. EXAM: ULTRASOUND ABDOMEN LIMITED RIGHT UPPER QUADRANT COMPARISON:  06/17/2010 FINDINGS: Gallbladder: No gallstones or wall thickening visualized. No sonographic Murphy sign noted by sonographer. Common bile duct: Diameter: Normal, 4 mm Liver: No focal lesion identified. Within normal limits in parenchymal echogenicity. Portal vein is patent on color Doppler imaging with normal direction of blood flow towards the liver. Right pleural effusion. IMPRESSION: 1.  No acute process or explanation for elevated bilirubin level. 2. Right pleural effusion. Electronically Signed   By: Abigail Miyamoto M.D.   On: 01/12/2017 16:37   US Thoracentesis Asp Pleural Space W/img Guide  Result Date: 01/14/2017 INDICATION: Patient with history of heart failure, left pleural effusion. Request is made for  diagnostic and therapeutic thoracentesis. EXAM: ULTRASOUND GUIDED DIAGNOSTIC AND THERAPEUTIC LEFT THORACENTESIS MEDICATIONS: 10 mL 1% lidocaine COMPLICATIONS: None immediate. PROCEDURE: An ultrasound guided thoracentesis was thoroughly discussed with the patient and questions answered. The benefits, risks, alternatives and complications were also  discussed. The patient understands and wishes to proceed with the procedure. Written consent was obtained. Ultrasound was performed to localize and mark an adequate pocket of fluid in the left chest. The area was then prepped and draped in the normal sterile fashion. 1% Lidocaine was used for local anesthesia. Under ultrasound guidance a Safe-T-Centesis catheter was introduced. Thoracentesis was performed. The catheter was removed and a dressing applied. FINDINGS: A total of approximately 1.0 L of amber fluid was removed. Samples were sent to the laboratory as requested by the clinical team. IMPRESSION: Successful ultrasound guided diagnostic and therapeutic thoracentesis yielding 1.0 L of pleural fluid. Read by: Brynda Greathouse PA-C Electronically Signed   By: Aletta Edouard M.D.   On: 01/14/2017 16:36     Assessment and Recommendation  84 y.o. female with acute on chronic systolic dysfunction congestive heart failure essential hypertension mixed hyperlipidemia chronic nonvalvular atrial fibrillation improved with intravenous Lasix 1. Change Lasix to oral medication management and watching closely for worsening chronic kidney disease 2. Continue beta blocker for heart rate control and LV systolic dysfunction 3. No anticoagulation due to patient and family wishes and fall risk 4. No further cardiac diagnostics necessary at this time 5. No further intervention of elevated troponin consistent with demand ischemia rather than acute coronary syndrome 6. Ambulation and follow for improvements of symptoms and possible discharge home today with follow-up next week as able  Signed, Serafina Royals M.D. FACC

## 2017-01-15 NOTE — Clinical Social Work Note (Signed)
CSW received referral for SNF.  Case discussed with case manager and plan is to discharge home with home health.  CSW to sign off please re-consult if social work needs arise.  Talina Pleitez R. Gurshaan Matsuoka, MSW, LCSWA [phone]  

## 2017-01-15 NOTE — Care Management (Signed)
Patient is currently receiving services through [facility] a community resource through [facility].  this is palliative follow up through [facility].  It is not billed through patient's insurance.  Spoke with Tammy and agency is aware of admission and discussed possible discharge within next 24-48 hours.  If patient needs home health physical therapy and closer nurse follow up then would need to add home health.  Daughter acknowledges [facility] has followed in the past and agreeable to agency but does not want to loose her services with [facility]. Informed her she would continue with those services.  Referral called to and accepted by [facility] for SN and PT.

## 2017-01-15 NOTE — Progress Notes (Signed)
[facility] [place] NAME: Wanda Davenport    MR#:  [id number]  DATE OF BIRTH:  March 27, 1923  SUBJECTIVE:   Pt. Here due to shortness of breath and a large left-sided pleural effusion. S/p US guided thoracentesis yesterday with 1L fluid removed. Pt. Feels better.     REVIEW OF SYSTEMS:    Review of Systems  Constitutional: Negative for chills and fever.  HENT: Negative for congestion and tinnitus.   Eyes: Negative for blurred vision and double vision.  Respiratory: Positive for shortness of breath. Negative for cough and wheezing.   Cardiovascular: Negative for chest pain, orthopnea and PND.  Gastrointestinal: Negative for abdominal pain, diarrhea, nausea and vomiting.  Genitourinary: Negative for dysuria and hematuria.  Neurological: Positive for weakness. Negative for dizziness, sensory change and focal weakness.  All other systems reviewed and are negative.    Nutrition: Heart Healthy Tolerating Diet: Yes Tolerating PT: Await Eval.   DRUG ALLERGIES:   Allergies  Allergen Reactions  . Fenofibrate Other (See Comments)    Myalgias/gi upset   . Statins Other (See Comments)    Myalgias   . Sulfonamide Derivatives Nausea And Vomiting    REACTION: nausea    VITALS:  Blood pressure (!) 109/53, pulse (!) 58, temperature 97.8 F (36.6 C), temperature source Oral, resp. rate 14, height 5\' 3"  (1.6 m), weight 69.4 kg (153 lb), SpO2 100 %.  PHYSICAL EXAMINATION:   Physical Exam  GENERAL:  84 y.o.-year-old patient lying in bed in no acute distress.  EYES: Pupils equal, round, reactive to light and accommodation. No scleral icterus. Extraocular muscles intact.  HEENT: Head atraumatic, normocephalic. Oropharynx and nasopharynx clear.  NECK:  Supple, no jugular venous distention. No thyroid enlargement, no tenderness.  LUNGS: Good A/E b/l, no wheezing, rales, rhonchi. No use of accessory muscles of respiration.  CARDIOVASCULAR: S1, S2  normal. No murmurs, rubs, or gallops.  ABDOMEN: Soft, nontender, nondistended. Bowel sounds present. No organomegaly or mass.  EXTREMITIES: No cyanosis, clubbing, + 2 edema b/l.  NEUROLOGIC: Cranial nerves II through XII are intact. No focal Motor or sensory deficits b/l.  Globally weak.  PSYCHIATRIC: The patient is alert and oriented x 3.  SKIN: No obvious rash, lesion, or ulcer.    LABORATORY PANEL:   CBC Recent Labs  Lab 01/13/17 0440  WBC 8.0  HGB 11.6*  HCT 37.3  PLT 166   ------------------------------------------------------------------------------------------------------------------  Chemistries  Recent Labs  Lab 01/12/17 1023 01/13/17 0440  01/15/17 0518  NA 134* 136   < > 136  K 3.2* 4.0   < > 4.6  CL 99* 103   < > 103  CO2 24 23   < > 22  GLUCOSE 139* 119*   < > 99  BUN 38* 40*   < > 52*  CREATININE 1.39* 1.60*   < > 1.87*  CALCIUM 10.1 10.1   < > 10.0  MG 2.2  --   --   --   AST 29  --   --   --   ALT 15  --   --   --   ALKPHOS 81  --   --   --   BILITOT 2.4* 2.0*  --   --    < > = values in this interval not displayed.   ------------------------------------------------------------------------------------------------------------------  Cardiac Enzymes Recent Labs  Lab 01/12/17 1527  TROPONINI 0.11*   ------------------------------------------------------------------------------------------------------------------  RADIOLOGY:  Dg Chest 1 View  Result  Date: 01/14/2017 CLINICAL DATA:  Post left thoracentesis EXAM: CHEST 1 VIEW COMPARISON:  01/12/2017 FINDINGS: Large left pleural effusion remains, slightly decreased since prior study. No pneumothorax. Left lower lobe atelectasis. No effusion or confluent opacity on the right. Mild cardiomegaly. IMPRESSION: Decreasing size of the large left pleural effusion. No pneumothorax. Electronically Signed   By: Rolm Baptise M.D.   On: 01/14/2017 12:13   Dg Chest Port 1 View  Result Date: 01/15/2017 CLINICAL  DATA:  Pleural effusion. EXAM: PORTABLE CHEST 1 VIEW COMPARISON:  Radiograph of January 14, 2017. FINDINGS: Stable cardiomegaly. No pneumothorax is noted. Left perihilar edema or pneumonia is noted. Left pleural effusion is smaller compared to prior exam. Probable mild right basilar atelectasis or infiltrate is noted with small right pleural effusion. Bony thorax is unremarkable. IMPRESSION: Significantly decreased size of left pleural effusion, with left perihilar edema or pneumonia present. Mild right basilar atelectasis or pneumonia is noted with probable minimal right pleural effusion. Electronically Signed   By: Marijo Conception, M.D.   On: 01/15/2017 07:53   US Thoracentesis Asp Pleural Space W/img Guide  Result Date: 01/14/2017 INDICATION: Patient with history of heart failure, left pleural effusion. Request is made for diagnostic and therapeutic thoracentesis. EXAM: ULTRASOUND GUIDED DIAGNOSTIC AND THERAPEUTIC LEFT THORACENTESIS MEDICATIONS: 10 mL 1% lidocaine COMPLICATIONS: None immediate. PROCEDURE: An ultrasound guided thoracentesis was thoroughly discussed with the patient and questions answered. The benefits, risks, alternatives and complications were also discussed. The patient understands and wishes to proceed with the procedure. Written consent was obtained. Ultrasound was performed to localize and mark an adequate pocket of fluid in the left chest. The area was then prepped and draped in the normal sterile fashion. 1% Lidocaine was used for local anesthesia. Under ultrasound guidance a Safe-T-Centesis catheter was introduced. Thoracentesis was performed. The catheter was removed and a dressing applied. FINDINGS: A total of approximately 1.0 L of amber fluid was removed. Samples were sent to the laboratory as requested by the clinical team. IMPRESSION: Successful ultrasound guided diagnostic and therapeutic thoracentesis yielding 1.0 L of pleural fluid. Read by: Brynda Greathouse PA-C Electronically  Signed   By: Aletta Edouard M.D.   On: 01/14/2017 16:36     ASSESSMENT AND PLAN:   84 year old female with past medical history hypertension, hyperlipidemia, coronary artery disease, chronic systolic CHF, anxiety who presents to the hospital due to worsening lower extremity edema and shortness of breath and noted to be in congestive heart failure.  1. CHF-acute on chronic systolic dysfunction. -Improved with IV Lasix and also thoracentesis yesterday. Chest x-ray today showing improvement in the left-sided pleural effusion. -We'll lower IV Lasix as creatinine was trending up. Continue to follow I's and O's and daily weights.  2. Left-sided pleural effusion-suspected to be secondary to CHF.  - s/p Thoracentesis and 1 litre of fluid removed. Fluid studies are consistent with a transudative effusion. Chest x-ray today showing improvement in the left-sided effusion. Repeat chest x-ray again in the morning.   3. Essential hypertension-continue bisoprolol  4. Depression-continue Lexapro, Celexa.  5. History of previous DVT- will resume Eliquis in a.m. Tomorrow.   Await PT eval  All the records are reviewed and case discussed with Care Management/Social Worker. Management plans discussed with the patient, family and they are in agreement.  CODE STATUS: Full code  DVT Prophylaxis: Ted's & SCD's.   TOTAL TIME TAKING CARE OF THIS PATIENT: 30 minutes.   POSSIBLE D/C IN 1-2 DAYS, DEPENDING ON CLINICAL CONDITION.   Healy  J M.D on 01/15/2017 at 2:39 PM  Between 7am to 6pm - Pager - [phone]  After 6pm go to [email or web address] - [employer] [facility]  Office  [phone]  CC: Primary care physician; Mellody Dance, DO

## 2017-01-15 NOTE — Care Management Important Message (Signed)
Important Message  Patient Details  Name: Wanda Davenport MRN: [id number] Date of Birth: [birth year]   Medicare Important Message Given:  Yes Signed IM notice given    Katrina Stack, RN 01/15/2017, 12:18 PM

## 2017-01-15 NOTE — Evaluation (Signed)
Physical Therapy Evaluation Patient Details Name: Wanda Davenport MRN: [id number] DOB: [birth year] Today's Date: 01/15/2017   History of Present Illness  Pt admitted for acute on chronic CHF. Pt with complaints of SOB and L side pleural effusion and is s/p thoracentesis yesterday. History includes HTN, CAD, and anxiety and is on 2L of O2 chronically.   Clinical Impression  Pt is a pleasant 84 year old female who was admitted for acute on chronic CHF. Pt performs bed mobility with supervision, transfers with cga, and ambulation with cga and RW. All mobility performed on 2L of O2 with sats WNL.  Pt demonstrates deficits with strength/mobility/endurance. Pt is at baseline level as she reports she only ambulates from room to room at baseline. Would benefit from skilled PT to address above deficits and promote optimal return to PLOF. Recommend transition to [place] upon discharge from acute hospitalization.       Follow Up Recommendations Home health PT    Equipment Recommendations  None recommended by PT    Recommendations for Other Services       Precautions / Restrictions Precautions Precautions: Fall Restrictions Weight Bearing Restrictions: No      Mobility  Bed Mobility Overal bed mobility: Needs Assistance Bed Mobility: Supine to Sit     Supine to sit: Supervision     General bed mobility comments: safe technique with use of bed rail. Once seated at EOB, pt able to sit with independence  Transfers Overall transfer level: Needs assistance Equipment used: Rolling walker (2 wheeled) Transfers: Sit to/from Stand Sit to Stand: Min guard         General transfer comment: safe technique with cues to push from seated surface and RW. Once standing, pt able to demonstrate upright posture. 2L of O2 donned for all mobility  Ambulation/Gait Ambulation/Gait assistance: Min guard Ambulation Distance (Feet): 80 Feet Assistive device: Rolling walker (2 wheeled) Gait  Pattern/deviations: Step-through pattern     General Gait Details: ambulated 2 laps in room with seated rest break. Pt demonstrates safe use of RW. O2 sats WNL during exertion. Rates ambulation at 5/10 on RPE scale  Stairs            Wheelchair Mobility    Modified Rankin (Stroke Patients Only)       Balance Overall balance assessment: Needs assistance;History of Falls Sitting-balance support: No upper extremity supported Sitting balance-Leahy Scale: Good     Standing balance support: Bilateral upper extremity supported Standing balance-Leahy Scale: Good                               Pertinent Vitals/Pain Pain Assessment: No/denies pain    Home Living Family/patient expects to be discharged to:: Private residence Living Arrangements: Alone Available Help at Discharge: Family Type of Home: House Home Access: Stairs to enter Entrance Stairs-Rails: None Entrance Stairs-Number of Steps: 2 steps to enter with L wall for support Home Layout: One level Home Equipment: Walker - 2 wheels;Cane - quad;Hand held shower head      Prior Function Level of Independence: Independent with assistive device(s)         Comments: Pt reports using RW for all needs     Hand Dominance        Extremity/Trunk Assessment   Upper Extremity Assessment Upper Extremity Assessment: Generalized weakness(B UE grossly 4/5)    Lower Extremity Assessment Lower Extremity Assessment: Generalized weakness(B LE grossly 4/5)  Communication   Communication: HOH  Cognition Arousal/Alertness: Awake/alert Behavior During Therapy: WFL for tasks assessed/performed Overall Cognitive Status: Within Functional Limits for tasks assessed                                        General Comments      Exercises     Assessment/Plan    PT Assessment Patient needs continued PT services  PT Problem List Decreased strength;Decreased activity tolerance;Decreased  balance;Decreased mobility       PT Treatment Interventions Gait training;DME instruction;Therapeutic exercise;Balance training    PT Goals (Current goals can be found in the Care Plan section)  Acute Rehab PT Goals Patient Stated Goal: to go home PT Goal Formulation: With patient Time For Goal Achievement: 01/29/17 Potential to Achieve Goals: Good    Frequency Min 2X/week   Barriers to discharge        Co-evaluation               AM-PAC PT "6 Clicks" Daily Activity  Outcome Measure Difficulty turning over in bed (including adjusting bedclothes, sheets and blankets)?: None Difficulty moving from lying on back to sitting on the side of the bed? : None Difficulty sitting down on and standing up from a chair with arms (e.g., wheelchair, bedside commode, etc,.)?: Unable Help needed moving to and from a bed to chair (including a wheelchair)?: A Little Help needed walking in hospital room?: A Little Help needed climbing 3-5 steps with a railing? : A Little 6 Click Score: 18    End of Session Equipment Utilized During Treatment: Gait belt;Oxygen Activity Tolerance: Patient tolerated treatment well Patient left: in bed;with bed alarm set Nurse Communication: Mobility status PT Visit Diagnosis: Muscle weakness (generalized) (M62.81);Difficulty in walking, not elsewhere classified (R26.2)    Time: 1478-2956 PT Time Calculation (min) (ACUTE ONLY): 18 min   Charges:   PT Evaluation $PT Eval Low Complexity: 1 Low PT Treatments $Gait Training: 8-22 mins   PT G CodesGreggory Stallion, PT, DPT [phone]   Nissan Frazzini 01/15/2017, 5:00 PM

## 2017-01-16 ENCOUNTER — Inpatient Hospital Stay: Payer: Medicare HMO

## 2017-01-16 LAB — BASIC METABOLIC PANEL
Anion gap: 10 (ref 5–15)
BUN: 57 mg/dL — ABNORMAL HIGH (ref 6–20)
CALCIUM: 9.9 mg/dL (ref 8.9–10.3)
CHLORIDE: 103 mmol/L (ref 101–111)
CO2: 23 mmol/L (ref 22–32)
CREATININE: 1.76 mg/dL — AB (ref 0.44–1.00)
GFR calc Af Amer: 28 mL/min — ABNORMAL LOW (ref >60)
GFR, EST NON AFRICAN AMERICAN: 24 mL/min — AB (ref >60)
Glucose, Bld: 96 mg/dL (ref 65–99)
Potassium: 4.5 mmol/L (ref 3.5–5.1)
SODIUM: 136 mmol/L (ref 135–145)

## 2017-01-16 LAB — PH, BODY FLUID: pH, Body Fluid: 7.5

## 2017-01-16 LAB — CYTOLOGY - NON PAP

## 2017-01-16 MED ORDER — FUROSEMIDE 10 MG/ML IJ SOLN: 40.0000 mg | Freq: Two times a day (BID) | INTRAMUSCULAR | Status: DC

## 2017-01-16 MED ORDER — POLYVINYL ALCOHOL 1.4 % OP SOLN
1.0000 [drp] | OPHTHALMIC | Status: DC | PRN
  Administered 2017-01-16: 1 [drp] via OPHTHALMIC
  Filled 2017-01-16: qty 15

## 2017-01-16 MED ORDER — FUROSEMIDE 10 MG/ML IJ SOLN
40.0000 mg | Freq: Three times a day (TID) | INTRAMUSCULAR | Status: DC
  Administered 2017-01-16 – 2017-01-17 (×4): 40 mg via INTRAVENOUS
  Filled 2017-01-16 (×4): qty 4

## 2017-01-16 NOTE — Progress Notes (Signed)
Physical Therapy Treatment Patient Details Name: Wanda Davenport MRN: [id number] DOB: December 08, 1923 Today's Date: 01/16/2017    History of Present Illness Pt admitted for acute on chronic CHF. Pt with complaints of SOB and L side pleural effusion and is s/p thoracentesis yesterday. History includes HTN, CAD, and anxiety and is on 2L of O2 chronically.     PT Comments    Pt is making gradual progress towards goals, however appears more sleepy this date and with increased SOB symptoms. Pt unable to tolerate further endurance training today, however agreeable to sitting in recliner. Family present. Good tolerance for seated there-ex. All mobility performed on 2L of O2. Will continue to progress.   Follow Up Recommendations  Home health PT     Equipment Recommendations  None recommended by PT    Recommendations for Other Services       Precautions / Restrictions Restrictions Weight Bearing Restrictions: No    Mobility  Bed Mobility Overal bed mobility: Needs Assistance Bed Mobility: Supine to Sit     Supine to sit: Min guard     General bed mobility comments: pt slightly sleepy this date, cues for sequencing. Once seated at EOB, able to sit with upright posture  Transfers Overall transfer level: Needs assistance Equipment used: Rolling walker (2 wheeled) Transfers: Sit to/from Stand Sit to Stand: Min guard         General transfer comment: safe technique with cues to push from seated surface and RW. Once standing, pt able to demonstrate upright posture. 2L of O2 donned for all mobility  Ambulation/Gait Ambulation/Gait assistance: Min guard Ambulation Distance (Feet): 20 Feet Assistive device: Rolling walker (2 wheeled) Gait Pattern/deviations: Step-through pattern     General Gait Details: ambulated in room due to fatigue present today and SOB symptoms. Safe technique with ambulation and use of AD. Pt does fatigue quickly. O2 sats at 92% with exertion.   Stairs             Wheelchair Mobility    Modified Rankin (Stroke Patients Only)       Balance                                            Cognition Arousal/Alertness: Lethargic Behavior During Therapy: WFL for tasks assessed/performed Overall Cognitive Status: Within Functional Limits for tasks assessed                                        Exercises Other Exercises Other Exercises: seated ther-ex performed including B LAQ, ankle pumps, quad sets, hip abd/add, and SAQ. ALl ther-ex performed x 12 reps with cga. A few breaks needed for SOB symptoms.    General Comments        Pertinent Vitals/Pain Pain Assessment: No/denies pain    Home Living                      Prior Function            PT Goals (current goals can now be found in the care plan section) Acute Rehab PT Goals Patient Stated Goal: to go home PT Goal Formulation: With patient Time For Goal Achievement: 01/29/17 Potential to Achieve Goals: Good Progress towards PT goals: Progressing toward goals    Frequency  Min 2X/week      PT Plan Current plan remains appropriate    Co-evaluation              AM-PAC PT "6 Clicks" Daily Activity  Outcome Measure  Difficulty turning over in bed (including adjusting bedclothes, sheets and blankets)?: None Difficulty moving from lying on back to sitting on the side of the bed? : Unable Difficulty sitting down on and standing up from a chair with arms (e.g., wheelchair, bedside commode, etc,.)?: Unable Help needed moving to and from a bed to chair (including a wheelchair)?: A Little Help needed walking in hospital room?: A Little Help needed climbing 3-5 steps with a railing? : A Little 6 Click Score: 15    End of Session Equipment Utilized During Treatment: Gait belt;Oxygen Activity Tolerance: Patient tolerated treatment well Patient left: in chair;with chair alarm set Nurse Communication: Mobility  status PT Visit Diagnosis: Muscle weakness (generalized) (M62.81);Difficulty in walking, not elsewhere classified (R26.2)     Time: 1886-7737 PT Time Calculation (min) (ACUTE ONLY): 17 min  Charges:  $Gait Training: 8-22 mins                    G Codes:       Greggory Stallion, PT, DPT [phone]    [name] 01/16/2017, 4:35 PM

## 2017-01-16 NOTE — Progress Notes (Signed)
[facility] [place] NAME: Wanda Davenport    MR#:  [id number]  DATE OF BIRTH:  Jun 03, 1923  SUBJECTIVE:   Patient continues to have some labored breathing at times. Somewhat lethargic today. Chest x-ray this morning showing bilateral pulmonary vascular congestion consistent with CHF. Family is at bedside.  REVIEW OF SYSTEMS:    Review of Systems  Constitutional: Negative for chills and fever.  HENT: Negative for congestion and tinnitus.   Eyes: Negative for blurred vision and double vision.  Respiratory: Positive for shortness of breath. Negative for cough and wheezing.   Cardiovascular: Negative for chest pain, orthopnea and PND.  Gastrointestinal: Negative for abdominal pain, diarrhea, nausea and vomiting.  Genitourinary: Negative for dysuria and hematuria.  Neurological: Positive for weakness. Negative for dizziness, sensory change and focal weakness.  All other systems reviewed and are negative.    Nutrition: Heart Healthy Tolerating Diet: Yes Tolerating PT: Eval noted.   DRUG ALLERGIES:   Allergies  Allergen Reactions  . Fenofibrate Other (See Comments)    Myalgias/gi upset   . Statins Other (See Comments)    Myalgias   . Sulfonamide Derivatives Nausea And Vomiting    REACTION: nausea    VITALS:  Blood pressure 100/66, pulse (!) 58, temperature 98.3 F (36.8 C), temperature source Axillary, resp. rate 18, height 5\' 3"  (1.6 m), weight 69.6 kg (153 lb 6.4 oz), SpO2 100 %.  PHYSICAL EXAMINATION:   Physical Exam  GENERAL:  84 y.o.-year-old patient lying in bed in no acute distress.  EYES: Pupils equal, round, reactive to light and accommodation. No scleral icterus. Extraocular muscles intact.  HEENT: Head atraumatic, normocephalic. Oropharynx and nasopharynx clear.  NECK:  Supple, no jugular venous distention. No thyroid enlargement, no tenderness.  LUNGS: Poor Resp. effort, no wheezing, rales @ bases b/l, No rhonchi. No  use of accessory muscles of respiration.  CARDIOVASCULAR: S1, S2 normal. No murmurs, rubs, or gallops.  ABDOMEN: Soft, nontender, nondistended. Bowel sounds present. No organomegaly or mass.  EXTREMITIES: No cyanosis, clubbing, + 2 edema b/l.  NEUROLOGIC: Cranial nerves II through XII are intact. No focal Motor or sensory deficits b/l.  Globally weak.  PSYCHIATRIC: The patient is alert and oriented x 3.  SKIN: No obvious rash, lesion, or ulcer.    LABORATORY PANEL:   CBC Recent Labs  Lab 01/13/17 0440  WBC 8.0  HGB 11.6*  HCT 37.3  PLT 166   ------------------------------------------------------------------------------------------------------------------  Chemistries  Recent Labs  Lab 01/12/17 1023 01/13/17 0440  01/16/17 0441  NA 134* 136   < > 136  K 3.2* 4.0   < > 4.5  CL 99* 103   < > 103  CO2 24 23   < > 23  GLUCOSE 139* 119*   < > 96  BUN 38* 40*   < > 57*  CREATININE 1.39* 1.60*   < > 1.76*  CALCIUM 10.1 10.1   < > 9.9  MG 2.2  --   --   --   AST 29  --   --   --   ALT 15  --   --   --   ALKPHOS 81  --   --   --   BILITOT 2.4* 2.0*  --   --    < > = values in this interval not displayed.   ------------------------------------------------------------------------------------------------------------------  Cardiac Enzymes Recent Labs  Lab 01/12/17 1527  TROPONINI 0.11*   ------------------------------------------------------------------------------------------------------------------  RADIOLOGY:  Dg  Chest Port 1 View  Result Date: 01/16/2017 CLINICAL DATA:  Pleural effusion. EXAM: PORTABLE CHEST 1 VIEW COMPARISON:  One-view chest x-ray 01/15/2017 FINDINGS: The heart is enlarged. Atherosclerotic changes are noted at the aortic arch. Diffuse interstitial edema is present. Left greater than right airspace disease and effusions are increasing. IMPRESSION: 1. Increasing bilateral airspace disease and effusions. While this may represent atelectasis, multi lobar  pneumonia is also considered. 2. Stable cardiomegaly and edema compatible with congestive heart failure. 3.  Aortic Atherosclerosis (ICD10-I70.0). Electronically Signed   By: San Morelle M.D.   On: 01/16/2017 09:07   Dg Chest Port 1 View  Result Date: 01/15/2017 CLINICAL DATA:  Pleural effusion. EXAM: PORTABLE CHEST 1 VIEW COMPARISON:  Radiograph of January 14, 2017. FINDINGS: Stable cardiomegaly. No pneumothorax is noted. Left perihilar edema or pneumonia is noted. Left pleural effusion is smaller compared to prior exam. Probable mild right basilar atelectasis or infiltrate is noted with small right pleural effusion. Bony thorax is unremarkable. IMPRESSION: Significantly decreased size of left pleural effusion, with left perihilar edema or pneumonia present. Mild right basilar atelectasis or pneumonia is noted with probable minimal right pleural effusion. Electronically Signed   By: Marijo Conception, M.D.   On: 01/15/2017 07:53     ASSESSMENT AND PLAN:   84 year old female with past medical history hypertension, hyperlipidemia, coronary artery disease, chronic systolic CHF, anxiety who presents to the hospital due to worsening lower extremity edema and shortness of breath and noted to be in congestive heart failure.  1. CHF-acute on chronic systolic dysfunction. -Patient's chest x-ray from this morning showing bilateral pulmonary vascular congestion consistent with CHF. -We'll continue IV Lasix but will advance dose. Patient is status post thoracentesis 2 days ago with 1 L of fluid removed. Continue to follow clinically, follow I's and O's and daily weights. -Continue bisoprolol.  2. Left-sided pleural effusion-suspected to be secondary to CHF.  - s/p Thoracentesis and 1 litre of fluid removed 2 days ago. Fluid studies are consistent with a transudative effusion.  3. Essential hypertension-continue bisoprolol  4. Depression-continue Lexapro, Celexa.  5. History of previous DVT- can  resume Eliqius soon.    PT eval noted and will d/c home with Home Health when CHF improved.   All the records are reviewed and case discussed with Care Management/Social Worker. Management plans discussed with the patient, family and they are in agreement.  CODE STATUS: Full code  DVT Prophylaxis: Ted's & SCD's.   TOTAL TIME TAKING CARE OF THIS PATIENT: 30 minutes.   POSSIBLE D/C IN 1-2 DAYS, DEPENDING ON CLINICAL CONDITION.   Henreitta Leber M.D on 01/16/2017 at 2:28 PM  Between 7am to 6pm - Pager - [phone]  After 6pm go to [email or web address] - Technical brewer [facility]  Office  [phone]  CC: Primary care physician; Mellody Dance, DO

## 2017-01-16 NOTE — Consult Note (Signed)
   [facility] CM Inpatient Consult   01/16/2017  Wanda Davenport April 17, 1923 [id number]   Chart review revealed patient eligible for [facility] Management services and post hospital discharge follow up related to a diagnosis of HF. Patient was evaluated for community based chronic disease management services with [facility] care Management Program as a benefit of patient's [facility] Medicare. Met with the patient, her son, daughter and daughter in law  at the bedside to explain [facility] services. Daughter in law stating patient is active with [facility]. Son stated he was unsure if patient was going straight home or would be going to SNF.  Verbal consent recieved. Patient gave her daughter in [place] number [phone] and [phone] as the best number to reach her. She also gave verbal permission to call her son Herbie [place] and daughter in Estate agent and discuss her care. Patient will receive post hospital discharge calls and be evaluated for monthly home visits. If patient goes to SNF patient will receive a visit from [facility]. [facility] Care Management services does not interfere with or replace any services arranged by the inpatient care management team. RNCM left contact information and [facility] literature at the bedside. Made inpatient RNCM aware that [facility] will be following for care management. For additional questions please contact:   Rubel Heckard RN, [facility] Liaison  [phone]) Business Mobile [phone]) Toll free office

## 2017-01-17 ENCOUNTER — Inpatient Hospital Stay: Payer: Medicare HMO

## 2017-01-17 ENCOUNTER — Encounter: Payer: Self-pay | Admitting: *Deleted

## 2017-01-17 DIAGNOSIS — I2782 Chronic pulmonary embolism: Secondary | ICD-10-CM | POA: Diagnosis not present

## 2017-01-17 DIAGNOSIS — I10 Essential (primary) hypertension: Secondary | ICD-10-CM | POA: Diagnosis not present

## 2017-01-17 DIAGNOSIS — I2510 Atherosclerotic heart disease of native coronary artery without angina pectoris: Secondary | ICD-10-CM | POA: Diagnosis not present

## 2017-01-17 DIAGNOSIS — Z955 Presence of coronary angioplasty implant and graft: Secondary | ICD-10-CM | POA: Diagnosis not present

## 2017-01-17 DIAGNOSIS — Z959 Presence of cardiac and vascular implant and graft, unspecified: Secondary | ICD-10-CM | POA: Diagnosis not present

## 2017-01-17 DIAGNOSIS — J90 Pleural effusion, not elsewhere classified: Secondary | ICD-10-CM | POA: Diagnosis not present

## 2017-01-17 DIAGNOSIS — Z79899 Other long term (current) drug therapy: Secondary | ICD-10-CM | POA: Diagnosis not present

## 2017-01-17 DIAGNOSIS — F329 Major depressive disorder, single episode, unspecified: Secondary | ICD-10-CM | POA: Diagnosis not present

## 2017-01-17 DIAGNOSIS — R7303 Prediabetes: Secondary | ICD-10-CM | POA: Diagnosis present

## 2017-01-17 DIAGNOSIS — I5023 Acute on chronic systolic (congestive) heart failure: Secondary | ICD-10-CM | POA: Diagnosis not present

## 2017-01-17 DIAGNOSIS — Z515 Encounter for palliative care: Secondary | ICD-10-CM | POA: Diagnosis not present

## 2017-01-17 DIAGNOSIS — Z66 Do not resuscitate: Secondary | ICD-10-CM | POA: Diagnosis not present

## 2017-01-17 DIAGNOSIS — I5043 Acute on chronic combined systolic (congestive) and diastolic (congestive) heart failure: Secondary | ICD-10-CM | POA: Diagnosis not present

## 2017-01-17 DIAGNOSIS — I5022 Chronic systolic (congestive) heart failure: Secondary | ICD-10-CM | POA: Diagnosis not present

## 2017-01-17 DIAGNOSIS — I4891 Unspecified atrial fibrillation: Secondary | ICD-10-CM | POA: Diagnosis not present

## 2017-01-17 DIAGNOSIS — Z91048 Other nonmedicinal substance allergy status: Secondary | ICD-10-CM | POA: Diagnosis not present

## 2017-01-17 DIAGNOSIS — M109 Gout, unspecified: Secondary | ICD-10-CM | POA: Diagnosis not present

## 2017-01-17 DIAGNOSIS — J449 Chronic obstructive pulmonary disease, unspecified: Secondary | ICD-10-CM | POA: Diagnosis present

## 2017-01-17 DIAGNOSIS — Z9911 Dependence on respirator [ventilator] status: Secondary | ICD-10-CM | POA: Diagnosis not present

## 2017-01-17 DIAGNOSIS — N179 Acute kidney failure, unspecified: Secondary | ICD-10-CM | POA: Diagnosis not present

## 2017-01-17 DIAGNOSIS — E871 Hypo-osmolality and hyponatremia: Secondary | ICD-10-CM | POA: Diagnosis not present

## 2017-01-17 DIAGNOSIS — R4182 Altered mental status, unspecified: Secondary | ICD-10-CM | POA: Diagnosis not present

## 2017-01-17 DIAGNOSIS — Z7189 Other specified counseling: Secondary | ICD-10-CM | POA: Diagnosis not present

## 2017-01-17 DIAGNOSIS — E785 Hyperlipidemia, unspecified: Secondary | ICD-10-CM | POA: Diagnosis not present

## 2017-01-17 DIAGNOSIS — Z7901 Long term (current) use of anticoagulants: Secondary | ICD-10-CM | POA: Diagnosis not present

## 2017-01-17 DIAGNOSIS — I350 Nonrheumatic aortic (valve) stenosis: Secondary | ICD-10-CM | POA: Diagnosis not present

## 2017-01-17 DIAGNOSIS — I482 Chronic atrial fibrillation: Secondary | ICD-10-CM | POA: Diagnosis not present

## 2017-01-17 DIAGNOSIS — F325 Major depressive disorder, single episode, in full remission: Secondary | ICD-10-CM | POA: Diagnosis not present

## 2017-01-17 DIAGNOSIS — I5033 Acute on chronic diastolic (congestive) heart failure: Secondary | ICD-10-CM | POA: Diagnosis not present

## 2017-01-17 DIAGNOSIS — I2699 Other pulmonary embolism without acute cor pulmonale: Secondary | ICD-10-CM | POA: Diagnosis not present

## 2017-01-17 DIAGNOSIS — Z7401 Bed confinement status: Secondary | ICD-10-CM | POA: Diagnosis not present

## 2017-01-17 DIAGNOSIS — R0902 Hypoxemia: Secondary | ICD-10-CM | POA: Diagnosis not present

## 2017-01-17 DIAGNOSIS — E440 Moderate protein-calorie malnutrition: Secondary | ICD-10-CM | POA: Diagnosis not present

## 2017-01-17 DIAGNOSIS — F419 Anxiety disorder, unspecified: Secondary | ICD-10-CM | POA: Diagnosis not present

## 2017-01-17 DIAGNOSIS — I4892 Unspecified atrial flutter: Secondary | ICD-10-CM | POA: Diagnosis not present

## 2017-01-17 DIAGNOSIS — Z961 Presence of intraocular lens: Secondary | ICD-10-CM | POA: Diagnosis present

## 2017-01-17 DIAGNOSIS — I447 Left bundle-branch block, unspecified: Secondary | ICD-10-CM | POA: Diagnosis not present

## 2017-01-17 DIAGNOSIS — R0602 Shortness of breath: Secondary | ICD-10-CM | POA: Diagnosis not present

## 2017-01-17 DIAGNOSIS — M8580 Other specified disorders of bone density and structure, unspecified site: Secondary | ICD-10-CM | POA: Diagnosis present

## 2017-01-17 DIAGNOSIS — I480 Paroxysmal atrial fibrillation: Secondary | ICD-10-CM | POA: Diagnosis present

## 2017-01-17 DIAGNOSIS — Z9842 Cataract extraction status, left eye: Secondary | ICD-10-CM | POA: Diagnosis not present

## 2017-01-17 DIAGNOSIS — R0600 Dyspnea, unspecified: Secondary | ICD-10-CM | POA: Diagnosis not present

## 2017-01-17 DIAGNOSIS — I110 Hypertensive heart disease with heart failure: Secondary | ICD-10-CM | POA: Diagnosis not present

## 2017-01-17 DIAGNOSIS — I509 Heart failure, unspecified: Secondary | ICD-10-CM | POA: Diagnosis not present

## 2017-01-17 DIAGNOSIS — J9621 Acute and chronic respiratory failure with hypoxia: Secondary | ICD-10-CM | POA: Diagnosis not present

## 2017-01-17 DIAGNOSIS — I252 Old myocardial infarction: Secondary | ICD-10-CM | POA: Diagnosis not present

## 2017-01-17 DIAGNOSIS — S6980XA Other specified injuries of unspecified wrist, hand and finger(s), initial encounter: Secondary | ICD-10-CM | POA: Diagnosis not present

## 2017-01-17 LAB — BASIC METABOLIC PANEL
Anion gap: 11 (ref 5–15)
BUN: 54 mg/dL — AB (ref 6–20)
CALCIUM: 9.5 mg/dL (ref 8.9–10.3)
CHLORIDE: 98 mmol/L — AB (ref 101–111)
CO2: 21 mmol/L — AB (ref 22–32)
CREATININE: 1.6 mg/dL — AB (ref 0.44–1.00)
GFR calc non Af Amer: 27 mL/min — ABNORMAL LOW (ref >60)
GFR, EST AFRICAN AMERICAN: 31 mL/min — AB (ref >60)
GLUCOSE: 84 mg/dL (ref 65–99)
Potassium: 4.2 mmol/L (ref 3.5–5.1)
Sodium: 130 mmol/L — ABNORMAL LOW (ref 135–145)

## 2017-01-17 LAB — BODY FLUID CULTURE: Culture: NO GROWTH

## 2017-01-17 LAB — CHOLESTEROL, BODY FLUID: CHOL FL: 22 mg/dL

## 2017-01-17 MED ORDER — FUROSEMIDE 10 MG/ML IJ SOLN: 40.0000 mg | Freq: Two times a day (BID) | INTRAMUSCULAR | Status: DC

## 2017-01-17 MED ORDER — POLYETHYLENE GLYCOL 3350 17 G PO PACK: 17.0000 g | PACK | Freq: Every day | ORAL | 0 refills | Status: AC | PRN

## 2017-01-17 MED ORDER — CLONAZEPAM 0.125 MG PO TBDP
0.1250 mg | ORAL_TABLET | Freq: Two times a day (BID) | ORAL | Status: DC | PRN
  Administered 2017-01-17: 0.125 mg via ORAL
  Filled 2017-01-17 (×2): qty 1

## 2017-01-17 MED ORDER — CLONAZEPAM 0.125 MG PO TBDP: 0.1250 mg | ORAL_TABLET | Freq: Two times a day (BID) | ORAL | 0 refills | Status: AC | PRN

## 2017-01-17 MED ORDER — APIXABAN 5 MG PO TABS: 5.0000 mg | ORAL_TABLET | Freq: Two times a day (BID) | ORAL | Status: DC

## 2017-01-17 NOTE — Clinical Social Work Placement (Signed)
   CLINICAL SOCIAL WORK PLACEMENT  NOTE  Date:  01/17/2017  Patient Details  Name: Wanda Davenport MRN: [id number] Date of Birth: 1923/03/16  Clinical Social Work is seeking post-discharge placement for this patient at the [place] level of care (*CSW will initial, date and re-position this form in  chart as items are completed):  Yes   Patient/family provided with [place] Work Department's list of facilities offering this level of care within the geographic area requested by the patient (or if unable, by the patient's family).  Yes   Patient/family informed of their freedom to choose among providers that offer the needed level of care, that participate in Medicare, Medicaid or managed care program needed by the patient, have an available bed and are willing to accept the patient.  Yes   Patient/family informed of [facility]'s ownership interest in [facility] and [facility], as well as of the fact that they are under no obligation to receive care at these facilities.  PASRR submitted to EDS on 01/17/17     PASRR number received on       Existing PASRR number confirmed on 01/17/17     FL2 transmitted to all facilities in geographic area requested by pt/family on 01/17/17     FL2 transmitted to all facilities within larger geographic area on       Patient informed that his/her managed care company has contracts with or will negotiate with certain facilities, including the following:        Yes   Patient/family informed of bed offers received.  Patient chooses bed at [facility]     Physician recommends and patient chooses bed at      Patient to be transferred to [facility] on 01/17/17.  Patient to be transferred to facility by [facility]     Patient family notified on 01/17/17 of transfer.  Name of family member notified:  [phone]     PHYSICIAN Please sign FL2     Additional  Comment:    _______________________________________________ Ross Ludwig, LCSWA 01/17/2017, 6:01 PM

## 2017-01-17 NOTE — Progress Notes (Signed)
Patient's family requested clonazepam to be given to patient but upon entering the room, noted that patient is resting quietly, very drowsy with no signs of agitation. Will hold off medication for now, support given to the family.

## 2017-01-17 NOTE — NC FL2 (Signed)
[place] LEVEL OF CARE SCREENING TOOL     IDENTIFICATION  Patient Name: Wanda Davenport Birthdate: 12/07/1923 Sex: female Admission Date (Current Location): 01/12/2017  [place] and [place] Number:  Engineering geologist and Address:  [facility], [address], [place], [place] [address]      Provider Number: [id number]  Attending Physician Name and Address:  Henreitta Leber, MD  Relative Name and Phone Number:  Sh[place], [place] [phone]  [phone] and Roof,Brenda Daughter [phone] or ROSALIE, BUENAVENTURA  [phone]     Current Level of Care: Hospital Recommended Level of Care: [place] Prior Approval Number:    Date Approved/Denied:   PASRR Number: [id number]  Discharge Plan: SNF    Current Diagnoses: Patient Active Problem List   Diagnosis Date Noted  . Acute on chronic systolic CHF (congestive heart failure) ([place]) 01/12/2017  . Bilateral leg edema 09/05/2016  . Sinusitis, acute maxillary 07/12/2016  . Cough 07/12/2016  . Shortness of breath 07/12/2016  . Pleural effusion, left 07/12/2016  . Chronic systolic heart failure ([place]) 06/15/2016  . Acute MI anterior wall subsequent episode care ([place]) 06/04/2016  . PE (pulmonary thromboembolism) ([place]) 05/19/2016  . Major depression in remission ([place]) 05/07/2016  . Pneumonia, community acquired 05/07/2016  . Unstable angina ([place])   . Coronary artery disease of native artery of native heart with stable angina pectoris ([place]) 05/01/2016  . STEMI (ST elevation myocardial infarction) ([place]) 04/26/2016  . Knee pain, bilateral 04/10/2016  . Medication monitoring encounter 01/26/2016  . Intolerance of drug- statins 01/25/2016  . Counseling regarding end of life decision making 01/25/2016  . Hypertriglyceridemia 12/13/2015  . Low serum HDL 12/13/2015  . B12 deficiency 12/13/2015  . Claudication of left lower extremity ([place]) 12/13/2015  . Vitamin D deficiency  11/06/2015  . Adjustment disorder with mixed anxiety and depressed mood 11/06/2015  . Basal cell carcinoma 10/31/2015  . Environmental and seasonal allergies 10/31/2015  . Reactive airway disease- only spring and fall due to allergies 10/31/2015  . GAD (generalized anxiety disorder) 10/13/2015  . Atypical chest pain 04/16/2011  . Hyperlipidemia   . Pre-diabetes   . chronic LBBB (left bundle branch block)- since 2012   . Generalized OA   . Allergic rhinitis   . DIVERTICULOSIS, COLON 12/03/2007  . HTN (hypertension) 04/26/2007    Orientation RESPIRATION BLADDER Height & Weight     Self, Time, Situation, Place  O2(2L) Continent Weight: 143 lb 12.8 oz (65.2 kg) Height:  5\' 3"  (160 cm)  BEHAVIORAL SYMPTOMS/MOOD NEUROLOGICAL BOWEL NUTRITION STATUS      Continent Diet(Cardiac)  AMBULATORY STATUS COMMUNICATION OF NEEDS Skin   Limited Assist Verbally Normal                       Personal Care Assistance Level of Assistance  Bathing, Feeding, Dressing Bathing Assistance: Limited assistance Feeding assistance: Independent Dressing Assistance: Limited assistance     Functional Limitations Info  Hearing, Speech, Sight Sight Info: Adequate Hearing Info: Adequate Speech Info: Adequate    SPECIAL CARE FACTORS FREQUENCY  PT (By licensed PT), OT (By licensed OT)     PT Frequency: 5x a week OT Frequency: 5x a week            Contractures Contractures Info: Not present    Additional Factors Info  Code Status, Allergies, Psychotropic Code Status Info: Full Code Allergies Info: FENOFIBRATE, STATINS, SULFONAMIDE DERIVATIVES  Psychotropic Info: escitalopram (LEXAPRO) tablet 10  mg          Current Medications (01/17/2017):  This is the current hospital active medication list Current Facility-Administered Medications  Medication Dose Route Frequency Provider Last Rate Last Dose  . 0.9 %  sodium chloride infusion  250 mL Intravenous PRN Demetrios Loll, MD      . acetaminophen  (TYLENOL) tablet 650 mg  650 mg Oral Q6H PRN Demetrios Loll, MD   650 mg at 01/15/17 2214   Or  . acetaminophen (TYLENOL) suppository 650 mg  650 mg Rectal Q6H PRN Demetrios Loll, MD      . albuterol (PROVENTIL) (2.5 MG/3ML) 0.083% nebulizer solution 2.5 mg  2.5 mg Nebulization Q2H PRN Demetrios Loll, MD   2.5 mg at 01/13/17 1116  . apixaban (ELIQUIS) tablet 5 mg  5 mg Oral BID Henreitta Leber, MD      . bisacodyl (DULCOLAX) EC tablet 5 mg  5 mg Oral Daily PRN Demetrios Loll, MD      . bisoprolol (ZEBETA) tablet 5 mg  5 mg Oral Daily Demetrios Loll, MD   5 mg at 01/15/17 0930  . clonazepam (KLONOPIN) disintegrating tablet 0.125 mg  0.125 mg Oral BID PRN Henreitta Leber, MD      . escitalopram (LEXAPRO) tablet 10 mg  10 mg Oral BID Demetrios Loll, MD   10 mg at 01/17/17 1044  . fluticasone (FLONASE) 50 MCG/ACT nasal spray 2 spray  2 spray Each Nare Daily Demetrios Loll, MD   2 spray at 01/17/17 1045  . [START ON 01/18/2017] furosemide (LASIX) injection 40 mg  40 mg Intravenous Q12H Henreitta Leber, MD      . HYDROcodone-acetaminophen (NORCO/VICODIN) 5-325 MG per tablet 1-2 tablet  1-2 tablet Oral Q4H PRN Demetrios Loll, MD      . montelukast (SINGULAIR) tablet 10 mg  10 mg Oral QHS Demetrios Loll, MD   10 mg at 01/16/17 2144  . nitroGLYCERIN (NITROSTAT) SL tablet 0.4 mg  0.4 mg Sublingual Q5 min PRN Demetrios Loll, MD      . ondansetron [facility]) tablet 4 mg  4 mg Oral Q6H PRN Demetrios Loll, MD       Or  . ondansetron [facility]) injection 4 mg  4 mg Intravenous Q6H PRN Demetrios Loll, MD      . polyvinyl alcohol (LIQUIFILM TEARS) 1.4 % ophthalmic solution 1 drop  1 drop Both Eyes PRN Henreitta Leber, MD   1 drop at 01/16/17 2145  . senna-docusate (Senokot-S) tablet 1 tablet  1 tablet Oral QHS PRN Demetrios Loll, MD      . sodium chloride flush (NS) 0.9 % injection 3 mL  3 mL Intravenous Q12H Demetrios Loll, MD   3 mL at 01/17/17 1048  . sodium chloride flush (NS) 0.9 % injection 3 mL  3 mL Intravenous PRN Demetrios Loll, MD   3 mL at 01/17/17 9485      Discharge Medications: Please see discharge summary for a list of discharge medications.  Relevant Imaging Results:  Relevant Lab Results:   Additional Information SS# [id number]  Anell Barr

## 2017-01-17 NOTE — Progress Notes (Signed)
Physical Therapy Treatment Patient Details Name: Wanda Davenport MRN: [id number] DOB: 09-09-1923 Today's Date: 01/17/2017    History of Present Illness Pt admitted for acute on chronic CHF. Pt with complaints of SOB and L side pleural effusion and is s/p thoracentesis yesterday. History includes HTN, CAD, and anxiety and is on 2L of O2 chronically.     PT Comments    Pt is making good progress towards goals with increased functional independence this date. Pt appears to be mainly limited by breathing, although O2 sats WNL with all exertion. Pt will be able to ambulate minimal household distances with use of RW as she is close to baseline level. Rollater may be of use for long distances. Family concerned that she lives alone, would benefit from increased care like ALF. Does not qualify for skilled SNF at this time as she is very close to baseline level. Will continue to progress.   Follow Up Recommendations  Home health PT     Equipment Recommendations  (rollater for longer distances)    Recommendations for Other Services       Precautions / Restrictions Precautions Precautions: Fall Restrictions Weight Bearing Restrictions: No    Mobility  Bed Mobility Overal bed mobility: Needs Assistance Bed Mobility: Supine to Sit     Supine to sit: Min guard     General bed mobility comments: Pt asleep upon arrival to room, however easily awakens with verbal cues. Able to use bedrail for sitting at EOB  Transfers Overall transfer level: Needs assistance Equipment used: Rolling walker (2 wheeled) Transfers: Sit to/from Stand Sit to Stand: Supervision         General transfer comment: cues to push from seated surface. Able to stand with upright posture. 2L of O2 donned for all mobility.  Ambulation/Gait Ambulation/Gait assistance: Supervision Ambulation Distance (Feet): 80 Feet Assistive device: Rolling walker (2 wheeled) Gait Pattern/deviations: Step-through pattern      General Gait Details: ambulated 2 laps in room with seated rest break in between. Pt rates exertion on RPE scale at 5/10. O2 sats and HR WNL with exertion. No unsteadiness noted.   Stairs            Wheelchair Mobility    Modified Rankin (Stroke Patients Only)       Balance                                            Cognition Arousal/Alertness: Awake/alert Behavior During Therapy: WFL for tasks assessed/performed Overall Cognitive Status: Within Functional Limits for tasks assessed                                        Exercises      General Comments        Pertinent Vitals/Pain Pain Assessment: No/denies pain    Home Living                      Prior Function            PT Goals (current goals can now be found in the care plan section) Acute Rehab PT Goals Patient Stated Goal: to go home PT Goal Formulation: With patient Time For Goal Achievement: 01/29/17 Potential to Achieve Goals: Good Progress towards PT goals: Progressing toward goals  Frequency    Min 2X/week      PT Plan Current plan remains appropriate    Co-evaluation              AM-PAC PT "6 Clicks" Daily Activity  Outcome Measure  Difficulty turning over in bed (including adjusting bedclothes, sheets and blankets)?: None Difficulty moving from lying on back to sitting on the side of the bed? : Unable Difficulty sitting down on and standing up from a chair with arms (e.g., wheelchair, bedside commode, etc,.)?: None Help needed moving to and from a bed to chair (including a wheelchair)?: None Help needed walking in hospital room?: None Help needed climbing 3-5 steps with a railing? : A Little 6 Click Score: 20    End of Session Equipment Utilized During Treatment: Gait belt;Oxygen Activity Tolerance: Patient tolerated treatment well Patient left: in chair;with chair alarm set;with family/visitor present Nurse Communication:  Mobility status PT Visit Diagnosis: Muscle weakness (generalized) (M62.81);Difficulty in walking, not elsewhere classified (R26.2)     Time: 1127-1201 PT Time Calculation (min) (ACUTE ONLY): 34 min  Charges:  $Gait Training: 23-37 mins                    G Codes:       Wanda Davenport, PT, DPT [phone]    Wanda Davenport 01/17/2017, 12:32 PM

## 2017-01-17 NOTE — Progress Notes (Signed)
Went over discharge instructions with the family including her medication and follow-up appointment. Called report to [employer] and talked to [place], Therapist, sports. Discontinue peripheral IV and telemetry monitor. Waiting for EMS for transport.

## 2017-01-17 NOTE — Plan of Care (Signed)
Continues to void/diurese without complications.  No respiratory distress noted.

## 2017-01-17 NOTE — Discharge Summary (Signed)
[facility] at [place] NAME: Wanda Davenport    MR#:  [id number]  DATE OF BIRTH:  08/19/1923  DATE OF ADMISSION:  01/12/2017 ADMITTING PHYSICIAN: Demetrios Loll, MD  DATE OF DISCHARGE: No discharge date for patient encounter.  PRIMARY CARE PHYSICIAN: Mellody Dance, DO    ADMISSION DIAGNOSIS:  SOB (shortness of breath) [R06.02] Pleural effusion [J90] Congestive heart failure, unspecified HF chronicity, unspecified heart failure type ([place]) [I50.9]  DISCHARGE DIAGNOSIS:  Active Problems:   Acute on chronic systolic CHF (congestive heart failure) ([place])   SECONDARY DIAGNOSIS:   Past Medical History:  Diagnosis Date  . Allergic rhinitis   . Anxiety   . Arthritis   . Atypical chest pain   . Bronchitis 01/14/2016  . Chronic diastolic CHF (congestive heart failure) (HCC)    Echo 06/2010 EF 60-65%, no RWMAs, grade 1 diastolic dysfunction, mild LAE, PASP 41mmHg.  Marland Kitchen Coronary artery disease    MI  . DIVERTICULOSIS, COLON   . HEMORRHOIDS, INTERNAL   . Hepatic steatosis   . Hyperlipidemia   . HYPERTENSION   . LBBB (left bundle branch block)    a. present on ECG 06/2010  . Osteopenia   . Pre-diabetes    A1c 6.0    HOSPITAL COURSE:   84 year old female with past medical history hypertension, hyperlipidemia, coronary artery disease, chronic systolic CHF, anxiety who presents to the hospital due to worsening lower extremity edema and shortness of breath and noted to be in congestive heart failure.  1. CHF-acute on chronic systolic dysfunction. - pt. Was diuresed with IV Lasix. Patient also underwent a ultrasound guided thoracentesis and had 1 L of fluid removed on the left side. -Her shortness of breath is improved, she is clinically not in congestive heart failure presently. She is being discharged on her home dose of torsemide 40 mg twice a day and also continue her bisoprolol and follow up with her cardiologist within the next week.  2.  Left-sided pleural effusion - secondary to CHF.  - s/p Thoracentesis and 1 litre of fluid removed 3 days ago. Fluid studies are consistent with a transudative effusion. - clinically improved and shortness of breath improved upon discharge.   3. Essential hypertension- pt. Will continue bisoprolol  4. Depression- pt. Will continue Lexapro, Celexa.  5. History of previous DVT- she will cont. Her Eliquis.  - Cardiology is considering stopping this due to her high fall risk but this can be discussed with him as outpatient.    6. Anxiety - started on some low dose Klonopin which she was discharged on.     DISCHARGE CONDITIONS:   Stable.   CONSULTS OBTAINED:  Treatment Team:  Corey Skains, MD  DRUG ALLERGIES:   Allergies  Allergen Reactions  . Fenofibrate Other (See Comments)    Myalgias/gi upset   . Statins Other (See Comments)    Myalgias   . Sulfonamide Derivatives Nausea And Vomiting    REACTION: nausea    DISCHARGE MEDICATIONS:   Allergies as of 01/17/2017      Reactions   Fenofibrate Other (See Comments)   Myalgias/gi upset   Statins Other (See Comments)   Myalgias   Sulfonamide Derivatives Nausea And Vomiting   REACTION: nausea      Medication List    STOP taking these medications   isosorbide mononitrate 30 MG 24 hr tablet Commonly known as:  IMDUR     TAKE these medications   albuterol 108 (90 Base)  MCG/ACT inhaler Commonly known as:  PROVENTIL HFA;VENTOLIN HFA Inhale 2 puffs into the lungs every 6 (six) hours as needed. Wheezing or shortness of breath   apixaban 5 MG Tabs tablet Commonly known as:  ELIQUIS Take 10 mg (2 tabs) twice a day till 05/22/16 and then from 05/23/16 take 1 tab (5 mg ) two times a day   aspirin 81 MG tablet Take by mouth daily.   bisoprolol 10 MG tablet Commonly known as:  ZEBETA Take 0.5 tablets (5 mg total) by mouth daily.   clonazepam 0.125 MG disintegrating tablet Commonly known as:  KLONOPIN Take 1 tablet  (0.125 mg total) by mouth 2 (two) times daily as needed (anxiety).   escitalopram 10 MG tablet Commonly known as:  LEXAPRO Take 1 tablet (10 mg total) by mouth 2 (two) times daily.   fluticasone 50 MCG/ACT nasal spray Commonly known as:  FLONASE Place 2 sprays into the nose daily. Nasal congestion Rarely used   montelukast 10 MG tablet Commonly known as:  SINGULAIR Take 1 tablet (10 mg total) by mouth at bedtime.   nitroGLYCERIN 0.4 MG SL tablet Commonly known as:  NITROSTAT Place under the tongue every 5 (five) minutes as needed for chest pain.   polyethylene glycol packet Commonly known as:  MIRALAX / GLYCOLAX Take 17 g by mouth daily as needed for mild constipation.   torsemide 20 MG tablet Commonly known as:  DEMADEX Take 2 tablets by mouth 2 (two) times daily.         DISCHARGE INSTRUCTIONS:   DIET:  Cardiac diet  DISCHARGE CONDITION:  Stable  ACTIVITY:  Activity as tolerated  OXYGEN:  Home Oxygen: Yes.     Oxygen Delivery: 1 liters/min via Patient connected to nasal cannula oxygen  DISCHARGE LOCATION:  nursing home   If you experience worsening of your admission symptoms, develop shortness of breath, life threatening emergency, suicidal or homicidal thoughts you must seek medical attention immediately by calling 911 or calling your MD immediately  if symptoms less severe.  You Must read complete instructions/literature along with all the possible adverse reactions/side effects for all the Medicines you take and that have been prescribed to you. Take any new Medicines after you have completely understood and accpet all the possible adverse reactions/side effects.   Please note  You were cared for by a hospitalist during your hospital stay. If you have any questions about your discharge medications or the care you received while you were in the hospital after you are discharged, you can call the unit and asked to speak with the hospitalist on call if the  hospitalist that took care of you is not available. Once you are discharged, your primary care physician will handle any further medical issues. Please note that NO REFILLS for any discharge medications will be authorized once you are discharged, as it is imperative that you return to your primary care physician (or establish a relationship with a primary care physician if you do not have one) for your aftercare needs so that they can reassess your need for medications and monitor your lab values.   DATA REVIEW:   CBC Recent Labs  Lab 01/13/17 0440  WBC 8.0  HGB 11.6*  HCT 37.3  PLT 166    Chemistries  Recent Labs  Lab 01/12/17 1023 01/13/17 0440  01/17/17 0451  NA 134* 136   < > 130*  K 3.2* 4.0   < > 4.2  CL 99* 103   < >  98*  CO2 24 23   < > 21*  GLUCOSE 139* 119*   < > 84  BUN 38* 40*   < > 54*  CREATININE 1.39* 1.60*   < > 1.60*  CALCIUM 10.1 10.1   < > 9.5  MG 2.2  --   --   --   AST 29  --   --   --   ALT 15  --   --   --   ALKPHOS 81  --   --   --   BILITOT 2.4* 2.0*  --   --    < > = values in this interval not displayed.    Cardiac Enzymes Recent Labs  Lab 01/12/17 1527  TROPONINI 0.11*    Microbiology Results  Results for orders placed or performed during the hospital encounter of 01/12/17  MRSA PCR Screening     Status: None   Collection Time: 01/14/17 10:24 AM  Result Value Ref Range Status   MRSA by PCR NEGATIVE NEGATIVE Final    Comment:        The GeneXpert MRSA Assay (FDA approved for NASAL specimens only), is one component of a comprehensive MRSA colonization surveillance program. It is not intended to diagnose MRSA infection nor to guide or monitor treatment for MRSA infections. Performed at [facility], [address] [place], [place] [address]   Body fluid culture     Status: None   Collection Time: 01/14/17 11:30 AM  Result Value Ref Range Status   Specimen Description   Final    PLEURAL Performed at [facility], [address], [place], [place] [address]    Special Requests   Final    NONE Performed at [facility], [place], [place] [address]    Gram Stain   Final    RARE WBC PRESENT,BOTH PMN AND MONONUCLEAR NO ORGANISMS SEEN    Culture   Final    NO GROWTH 3 DAYS Performed at [facility], [place] [address], [place], [place] [address]    Report Status 01/17/2017 FINAL  Final    RADIOLOGY:  Dg Chest Port 1 View  Result Date: 01/17/2017 CLINICAL DATA:  CHF EXAM: PORTABLE CHEST 1 VIEW COMPARISON:  01/16/2017 FINDINGS: Cardiac enlargement. Congestive heart failure with mild edema unchanged. Bilateral pleural effusions left greater than right unchanged. Bibasilar atelectasis left greater than right unchanged. IMPRESSION: Congestive heart failure with edema and bilateral effusions left greater than right unchanged from yesterday. Electronically Signed   By: Franchot Gallo M.D.   On: 01/17/2017 11:27   Dg Chest Port 1 View  Result Date: 01/16/2017 CLINICAL DATA:  Pleural effusion. EXAM: PORTABLE CHEST 1 VIEW COMPARISON:  One-view chest x-ray 01/15/2017 FINDINGS: The heart is enlarged. Atherosclerotic changes are noted at the aortic arch. Diffuse interstitial edema is present. Left greater than right airspace disease and effusions are increasing. IMPRESSION: 1. Increasing bilateral airspace disease and effusions. While this may represent atelectasis, multi lobar pneumonia is also considered. 2. Stable cardiomegaly and edema compatible with congestive heart failure. 3.  Aortic Atherosclerosis (ICD10-I70.0). Electronically Signed   By: San Morelle M.D.   On: 01/16/2017 09:07      Management plans discussed with the patient, family and they are in agreement.  CODE STATUS:     Code Status Orders  (From admission, onward)        Start     Ordered   01/12/17 1257  Full  code  Continuous     01/12/17 1256  Advance Directive Documentation      Most Recent Value  Type of Advance Directive  Living will  Pre-existing out of facility DNR order (yellow form or pink MOST form)  No data  "MOST" Form in Place?  No data      TOTAL TIME TAKING CARE OF THIS PATIENT: 40 minutes.    Henreitta Leber M.D on 01/17/2017 at 4:00 PM  Between 7am to 6pm - Pager - [phone]  After 6pm go to [email or web address] - [employer] [facility]  Office  [phone]  CC: Primary care physician; Mellody Dance, DO

## 2017-01-17 NOTE — Progress Notes (Signed)
[place] at [place] NAME: Wanda Davenport    MR#:  [id number]  DATE OF BIRTH:  1923-10-09  SUBJECTIVE:   Patient's family is at bedside and they're more concerned about her weakness and getting around. Chest x-ray this morning showing CHF but unchanged from yesterday. Patient having minimal response with IV diuretics presently. Patient seems anxious at times as per the family.  REVIEW OF SYSTEMS:    Review of Systems  Constitutional: Negative for chills and fever.  HENT: Negative for congestion and tinnitus.   Eyes: Negative for blurred vision and double vision.  Respiratory: Positive for shortness of breath. Negative for cough and wheezing.   Cardiovascular: Negative for chest pain, orthopnea and PND.  Gastrointestinal: Negative for abdominal pain, diarrhea, nausea and vomiting.  Genitourinary: Negative for dysuria and hematuria.  Neurological: Positive for weakness. Negative for dizziness, sensory change and focal weakness.  Psychiatric/Behavioral: The patient is nervous/anxious.   All other systems reviewed and are negative.    Nutrition: Heart Healthy Tolerating Diet: Yes Tolerating PT: Eval noted.   DRUG ALLERGIES:   Allergies  Allergen Reactions  . Fenofibrate Other (See Comments)    Myalgias/gi upset   . Statins Other (See Comments)    Myalgias   . Sulfonamide Derivatives Nausea And Vomiting    REACTION: nausea    VITALS:  Blood pressure 99/70, pulse 66, temperature 97.6 F (36.4 C), temperature source Oral, resp. rate 18, height 5\' 3"  (1.6 m), weight 65.2 kg (143 lb 12.8 oz), SpO2 96 %.  PHYSICAL EXAMINATION:   Physical Exam  GENERAL:  84 y.o.-year-old patient lying in bed in no acute distress.  EYES: Pupils equal, round, reactive to light and accommodation. No scleral icterus. Extraocular muscles intact.  HEENT: Head atraumatic, normocephalic. Oropharynx and nasopharynx clear.  NECK:  Supple, no jugular venous  distention. No thyroid enlargement, no tenderness.  LUNGS: Poor Resp. effort, no wheezing, minimal rales @ bases b/l, No rhonchi. No use of accessory muscles of respiration.  CARDIOVASCULAR: S1, S2 normal. No murmurs, rubs, or gallops.  ABDOMEN: Soft, nontender, nondistended. Bowel sounds present. No organomegaly or mass.  EXTREMITIES: No cyanosis, clubbing, + 2 edema b/l.  NEUROLOGIC: Cranial nerves II through XII are intact. No focal Motor or sensory deficits b/l.  Globally weak.  PSYCHIATRIC: The patient is alert and oriented x 3.  SKIN: No obvious rash, lesion, or ulcer.    LABORATORY PANEL:   CBC Recent Labs  Lab 01/13/17 0440  WBC 8.0  HGB 11.6*  HCT 37.3  PLT 166   ------------------------------------------------------------------------------------------------------------------  Chemistries  Recent Labs  Lab 01/12/17 1023 01/13/17 0440  01/17/17 0451  NA 134* 136   < > 130*  K 3.2* 4.0   < > 4.2  CL 99* 103   < > 98*  CO2 24 23   < > 21*  GLUCOSE 139* 119*   < > 84  BUN 38* 40*   < > 54*  CREATININE 1.39* 1.60*   < > 1.60*  CALCIUM 10.1 10.1   < > 9.5  MG 2.2  --   --   --   AST 29  --   --   --   ALT 15  --   --   --   ALKPHOS 81  --   --   --   BILITOT 2.4* 2.0*  --   --    < > = values in this interval not displayed.   ------------------------------------------------------------------------------------------------------------------  Cardiac Enzymes Recent Labs  Lab 01/12/17 1527  TROPONINI 0.11*   ------------------------------------------------------------------------------------------------------------------  RADIOLOGY:  Dg Chest Port 1 View  Result Date: 01/17/2017 CLINICAL DATA:  CHF EXAM: PORTABLE CHEST 1 VIEW COMPARISON:  01/16/2017 FINDINGS: Cardiac enlargement. Congestive heart failure with mild edema unchanged. Bilateral pleural effusions left greater than right unchanged. Bibasilar atelectasis left greater than right unchanged. IMPRESSION:  Congestive heart failure with edema and bilateral effusions left greater than right unchanged from yesterday. Electronically Signed   By: Franchot Gallo M.D.   On: 01/17/2017 11:27   Dg Chest Port 1 View  Result Date: 01/16/2017 CLINICAL DATA:  Pleural effusion. EXAM: PORTABLE CHEST 1 VIEW COMPARISON:  One-view chest x-ray 01/15/2017 FINDINGS: The heart is enlarged. Atherosclerotic changes are noted at the aortic arch. Diffuse interstitial edema is present. Left greater than right airspace disease and effusions are increasing. IMPRESSION: 1. Increasing bilateral airspace disease and effusions. While this may represent atelectasis, multi lobar pneumonia is also considered. 2. Stable cardiomegaly and edema compatible with congestive heart failure. 3.  Aortic Atherosclerosis (ICD10-I70.0). Electronically Signed   By: San Morelle M.D.   On: 01/16/2017 09:07     ASSESSMENT AND PLAN:   84 year old female with past medical history hypertension, hyperlipidemia, coronary artery disease, chronic systolic CHF, anxiety who presents to the hospital due to worsening lower extremity edema and shortness of breath and noted to be in congestive heart failure.  1. CHF-acute on chronic systolic dysfunction. -Patient's chest x-ray from this morning showing signs of CHF but unchanged from yesterday.  - cont. IV Lasix but will taper to q 12 hrs.   Patient is status post thoracentesis 2 days ago with 1 L of fluid removed. Continue to follow clinically, follow I's and O's and daily weights. -Continue bisoprolol.  2. Left-sided pleural effusion-suspected to be secondary to CHF.  - s/p Thoracentesis and 1 litre of fluid removed 2 days ago. Fluid studies are consistent with a transudative effusion.  3. Essential hypertension-continue bisoprolol  4. Depression-continue Lexapro, Celexa.  5. History of previous DVT- cont. Eliquis and will resume today.   6. Anxiety - started on some low dose Klonopin.    Physical therapy so recommending home with home health services. Patient's family is very concerned about her going home and social worker consulted and they're looking for possible assisted living.  All the records are reviewed and case discussed with Care Management/Social Worker. Management plans discussed with the patient, family and they are in agreement.  CODE STATUS: Full code  DVT Prophylaxis: Ted's & SCD's.   TOTAL TIME TAKING CARE OF THIS PATIENT: 30 minutes.   POSSIBLE D/C IN 1-2 DAYS, DEPENDING ON CLINICAL CONDITION.   Henreitta Leber M.D on 01/17/2017 at 3:20 PM  Between 7am to 6pm - Pager - [phone]  After 6pm go to [email or web address] - [employer] [facility]  Office  [phone]  CC: Primary care physician; Mellody Dance, DO

## 2017-01-17 NOTE — Care Management Important Message (Signed)
Important Message  Patient Details  Name: Wanda Davenport MRN: [id number] Date of Birth: 04/18/1923   Medicare Important Message Given:  Yes    Katrina Stack, RN 01/17/2017, 3:09 PM

## 2017-01-17 NOTE — Clinical Social Work Note (Signed)
Clinical Social Work Assessment  Patient Details  Name: Wanda Davenport MRN: [id number] Date of Birth: June 15, 1923  Date of referral:  01/17/17               Reason for consult:  Facility Placement                Permission sought to share information with:  Family Supports, Customer service manager Permission granted to share information::  Yes, Verbal Permission Granted  Name::     Pinkie, Manger [phone]  [phone] or Roof,Brenda Daughter [phone] or FUSAYE, WACHTEL  [phone]   Agency::  SNF admissions  Relationship::     Contact Information:     Housing/Transportation Living arrangements for the past 2 months:  [place] of Information:  Patient, Adult Children Patient Interpreter Needed:  None Criminal Activity/Legal Involvement Pertinent to Current Situation/Hospitalization:  No - Comment as needed Significant Relationships:  Adult Children Lives with:  Self Do you feel safe going back to the place where you live?  No Need for family participation in patient care:  No (Coment)  Care giving concerns:  Patient and family are concerned that patient can not stay at her home by herself until she gets stronger.   Social Worker assessment / plan:  Patient is 84 year old female who is alert and oriented x4.  Patient lives alone and is on oxygen patient's family were at bedside.  CSW spoke to patient and her family in regards to going to SNF and the process for trying to find placement.  CSW explained that SNF would have to get insurance approval for her to go to SNF.  CSW explained that there is a chance that patient may not get approved and if she does not they will have to look at other options of either going home with home health or paying privately at [facility].  CSW told patient's family if she is approved, it may only be for a few days, family is agreeable to this plan.  Patient and family did not have any other questions or concerns and gave CSW  permission to begin bed search in [place] and [place].  Employment status:  Retired Nurse, adult PT Recommendations:  Home with [place] / Referral to community resources:  [place]  Patient/Family's Response to care:  Patient and family agreeable to going to SNF for short term rehab.  Patient/Family's Understanding of and Emotional Response to Diagnosis, Current Treatment, and Prognosis:  Patient and family are hopeful that patient's treatment plan will help her go to a higher level of care like ALF or SNF.  Emotional Assessment Appearance:  Appears stated age Attitude/Demeanor/Rapport:    Affect (typically observed):  Appropriate, Calm, Stable Orientation:  Oriented to Self, Oriented to Place, Oriented to  Time, Oriented to Situation Alcohol / Substance use:  Not Applicable Psych involvement (Current and /or in the community):  No (Comment)  Discharge Needs  Concerns to be addressed:    Readmission within the last 30 days:  No Current discharge risk:  Lack of support system, Lives alone Barriers to Discharge:  No Barriers Identified   Anell Barr 01/17/2017, 4:50 PM

## 2017-01-17 NOTE — Progress Notes (Signed)
EMS here and picked up patient.

## 2017-01-17 NOTE — Clinical Social Work Note (Addendum)
[place] agreed to a 5 day LOG while waiting for [employer], CSW updated patient and her daughter.    Patient to be d/c'ed today to [facility].  Patient and family agreeable to plans will transport via ems RN to call report [phone].  Wanda Davenport, MSW, [place]

## 2017-01-18 ENCOUNTER — Other Ambulatory Visit: Payer: Self-pay | Admitting: *Deleted

## 2017-01-18 ENCOUNTER — Encounter: Payer: Self-pay | Admitting: *Deleted

## 2017-01-18 ENCOUNTER — Ambulatory Visit: Payer: Medicare HMO | Admitting: Family

## 2017-01-18 DIAGNOSIS — I2510 Atherosclerotic heart disease of native coronary artery without angina pectoris: Secondary | ICD-10-CM | POA: Diagnosis not present

## 2017-01-18 DIAGNOSIS — I5022 Chronic systolic (congestive) heart failure: Secondary | ICD-10-CM | POA: Diagnosis not present

## 2017-01-18 DIAGNOSIS — F325 Major depressive disorder, single episode, in full remission: Secondary | ICD-10-CM | POA: Diagnosis not present

## 2017-01-18 DIAGNOSIS — I2782 Chronic pulmonary embolism: Secondary | ICD-10-CM | POA: Diagnosis not present

## 2017-01-18 DIAGNOSIS — I5033 Acute on chronic diastolic (congestive) heart failure: Secondary | ICD-10-CM | POA: Diagnosis not present

## 2017-01-18 NOTE — Patient Outreach (Signed)
[facility]) Care Management  01/18/2017  CAROLE DONER 1923-11-28 [id number]  Referral date : 01/18/16 Referral source : Hospital Liaison Date of Admission 01/12/2017 Diagnosis : Acute on Chronic Systolic Heart Failure, Pleural effusion Discharge date 01/17/2017 Disposition : [employer], [place] Coordination call  Placed call to [facility]  spoke with  Tammy to discuss patient follow up. Patient was active with [facility] prior to admission to hospital , Tammy states once patient is admitted to skilled facility, they close case as they do not follow patient's in skilled facility,  [facility]  will need to be re-consulted once patient returns to home setting for continued services.   Will update , [facility] LCSW Deitra Mayo with information by in [employer].   [name], RN, [place] Management Coordinator  [phone]- Mobile [phone]- Toll Free Main Office

## 2017-01-21 ENCOUNTER — Encounter: Payer: Self-pay | Admitting: *Deleted

## 2017-01-21 ENCOUNTER — Other Ambulatory Visit: Payer: Self-pay | Admitting: *Deleted

## 2017-01-21 ENCOUNTER — Emergency Department: Payer: Medicare HMO

## 2017-01-21 ENCOUNTER — Inpatient Hospital Stay
Admission: EM | Admit: 2017-01-21 | Discharge: 2017-01-25 | DRG: 291 | Disposition: A | Discharge status: Skilled Nursing Facility | Payer: Medicare HMO | Attending: Internal Medicine | Admitting: Internal Medicine

## 2017-01-21 ENCOUNTER — Encounter: Payer: Self-pay | Admitting: Emergency Medicine

## 2017-01-21 ENCOUNTER — Telehealth: Payer: Self-pay | Admitting: Family

## 2017-01-21 ENCOUNTER — Ambulatory Visit: Payer: Medicare HMO | Admitting: Family

## 2017-01-21 ENCOUNTER — Other Ambulatory Visit: Payer: Self-pay

## 2017-01-21 DIAGNOSIS — I110 Hypertensive heart disease with heart failure: Principal | ICD-10-CM | POA: Diagnosis present

## 2017-01-21 DIAGNOSIS — J90 Pleural effusion, not elsewhere classified: Secondary | ICD-10-CM | POA: Diagnosis not present

## 2017-01-21 DIAGNOSIS — F419 Anxiety disorder, unspecified: Secondary | ICD-10-CM | POA: Diagnosis present

## 2017-01-21 DIAGNOSIS — S6980XA Other specified injuries of unspecified wrist, hand and finger(s), initial encounter: Secondary | ICD-10-CM | POA: Diagnosis not present

## 2017-01-21 DIAGNOSIS — I447 Left bundle-branch block, unspecified: Secondary | ICD-10-CM | POA: Diagnosis present

## 2017-01-21 DIAGNOSIS — I5043 Acute on chronic combined systolic (congestive) and diastolic (congestive) heart failure: Secondary | ICD-10-CM | POA: Diagnosis not present

## 2017-01-21 DIAGNOSIS — R0602 Shortness of breath: Secondary | ICD-10-CM | POA: Diagnosis not present

## 2017-01-21 DIAGNOSIS — I2699 Other pulmonary embolism without acute cor pulmonale: Secondary | ICD-10-CM | POA: Diagnosis not present

## 2017-01-21 DIAGNOSIS — Z961 Presence of intraocular lens: Secondary | ICD-10-CM | POA: Diagnosis present

## 2017-01-21 DIAGNOSIS — Z79899 Other long term (current) drug therapy: Secondary | ICD-10-CM

## 2017-01-21 DIAGNOSIS — I2510 Atherosclerotic heart disease of native coronary artery without angina pectoris: Secondary | ICD-10-CM | POA: Diagnosis not present

## 2017-01-21 DIAGNOSIS — Z66 Do not resuscitate: Secondary | ICD-10-CM | POA: Diagnosis not present

## 2017-01-21 DIAGNOSIS — Z7901 Long term (current) use of anticoagulants: Secondary | ICD-10-CM

## 2017-01-21 DIAGNOSIS — R7303 Prediabetes: Secondary | ICD-10-CM | POA: Diagnosis present

## 2017-01-21 DIAGNOSIS — Z515 Encounter for palliative care: Secondary | ICD-10-CM | POA: Diagnosis present

## 2017-01-21 DIAGNOSIS — Z7982 Long term (current) use of aspirin: Secondary | ICD-10-CM

## 2017-01-21 DIAGNOSIS — I5023 Acute on chronic systolic (congestive) heart failure: Secondary | ICD-10-CM | POA: Diagnosis present

## 2017-01-21 DIAGNOSIS — E440 Moderate protein-calorie malnutrition: Secondary | ICD-10-CM | POA: Diagnosis present

## 2017-01-21 DIAGNOSIS — Z9981 Dependence on supplemental oxygen: Secondary | ICD-10-CM | POA: Diagnosis not present

## 2017-01-21 DIAGNOSIS — I4892 Unspecified atrial flutter: Secondary | ICD-10-CM | POA: Diagnosis not present

## 2017-01-21 DIAGNOSIS — I252 Old myocardial infarction: Secondary | ICD-10-CM

## 2017-01-21 DIAGNOSIS — F329 Major depressive disorder, single episode, unspecified: Secondary | ICD-10-CM | POA: Diagnosis present

## 2017-01-21 DIAGNOSIS — E785 Hyperlipidemia, unspecified: Secondary | ICD-10-CM | POA: Diagnosis present

## 2017-01-21 DIAGNOSIS — Z9842 Cataract extraction status, left eye: Secondary | ICD-10-CM | POA: Diagnosis not present

## 2017-01-21 DIAGNOSIS — I482 Chronic atrial fibrillation: Secondary | ICD-10-CM | POA: Diagnosis not present

## 2017-01-21 DIAGNOSIS — J440 Chronic obstructive pulmonary disease with acute lower respiratory infection: Secondary | ICD-10-CM | POA: Diagnosis not present

## 2017-01-21 DIAGNOSIS — R0902 Hypoxemia: Secondary | ICD-10-CM

## 2017-01-21 DIAGNOSIS — M8580 Other specified disorders of bone density and structure, unspecified site: Secondary | ICD-10-CM | POA: Diagnosis present

## 2017-01-21 DIAGNOSIS — Z91048 Other nonmedicinal substance allergy status: Secondary | ICD-10-CM | POA: Diagnosis not present

## 2017-01-21 DIAGNOSIS — Z87891 Personal history of nicotine dependence: Secondary | ICD-10-CM

## 2017-01-21 DIAGNOSIS — Z882 Allergy status to sulfonamides status: Secondary | ICD-10-CM

## 2017-01-21 DIAGNOSIS — I10 Essential (primary) hypertension: Secondary | ICD-10-CM | POA: Diagnosis not present

## 2017-01-21 DIAGNOSIS — M109 Gout, unspecified: Secondary | ICD-10-CM | POA: Diagnosis not present

## 2017-01-21 DIAGNOSIS — I480 Paroxysmal atrial fibrillation: Secondary | ICD-10-CM | POA: Diagnosis present

## 2017-01-21 DIAGNOSIS — J449 Chronic obstructive pulmonary disease, unspecified: Secondary | ICD-10-CM | POA: Diagnosis present

## 2017-01-21 DIAGNOSIS — Z955 Presence of coronary angioplasty implant and graft: Secondary | ICD-10-CM

## 2017-01-21 DIAGNOSIS — Z888 Allergy status to other drugs, medicaments and biological substances status: Secondary | ICD-10-CM

## 2017-01-21 DIAGNOSIS — I4891 Unspecified atrial fibrillation: Secondary | ICD-10-CM | POA: Diagnosis not present

## 2017-01-21 DIAGNOSIS — Z7189 Other specified counseling: Secondary | ICD-10-CM | POA: Diagnosis not present

## 2017-01-21 DIAGNOSIS — J9621 Acute and chronic respiratory failure with hypoxia: Secondary | ICD-10-CM | POA: Diagnosis not present

## 2017-01-21 DIAGNOSIS — I5022 Chronic systolic (congestive) heart failure: Secondary | ICD-10-CM | POA: Diagnosis not present

## 2017-01-21 DIAGNOSIS — Z959 Presence of cardiac and vascular implant and graft, unspecified: Secondary | ICD-10-CM | POA: Diagnosis not present

## 2017-01-21 DIAGNOSIS — Z9889 Other specified postprocedural states: Secondary | ICD-10-CM

## 2017-01-21 DIAGNOSIS — Z8249 Family history of ischemic heart disease and other diseases of the circulatory system: Secondary | ICD-10-CM

## 2017-01-21 DIAGNOSIS — I5042 Chronic combined systolic (congestive) and diastolic (congestive) heart failure: Secondary | ICD-10-CM | POA: Diagnosis not present

## 2017-01-21 LAB — BASIC METABOLIC PANEL
ANION GAP: 15 (ref 5–15)
BUN: 40 mg/dL — ABNORMAL HIGH (ref 6–20)
CHLORIDE: 104 mmol/L (ref 101–111)
CO2: 23 mmol/L (ref 22–32)
Calcium: 10 mg/dL (ref 8.9–10.3)
Creatinine, Ser: 1.38 mg/dL — ABNORMAL HIGH (ref 0.44–1.00)
GFR calc non Af Amer: 32 mL/min — ABNORMAL LOW (ref >60)
GFR, EST AFRICAN AMERICAN: 37 mL/min — AB (ref >60)
Glucose, Bld: 100 mg/dL — ABNORMAL HIGH (ref 65–99)
POTASSIUM: 4 mmol/L (ref 3.5–5.1)
SODIUM: 142 mmol/L (ref 135–145)

## 2017-01-21 LAB — TROPONIN I

## 2017-01-21 LAB — CBC WITH DIFFERENTIAL/PLATELET
BASOS ABS: 0.1 10*3/uL (ref 0–0.1)
Basophils Relative: 1 %
EOS ABS: 0.2 10*3/uL (ref 0–0.7)
Eosinophils Relative: 2 %
HCT: 37.2 % (ref 35.0–47.0)
HEMOGLOBIN: 11.5 g/dL — AB (ref 12.0–16.0)
LYMPHS ABS: 1.4 10*3/uL (ref 1.0–3.6)
Lymphocytes Relative: 13 %
MCH: 21.7 pg — AB (ref 26.0–34.0)
MCHC: 30.8 g/dL — ABNORMAL LOW (ref 32.0–36.0)
MCV: 70.4 fL — ABNORMAL LOW (ref 80.0–100.0)
Monocytes Absolute: 1.4 10*3/uL — ABNORMAL HIGH (ref 0.2–0.9)
Monocytes Relative: 13 %
NEUTROS PCT: 71 %
Neutro Abs: 8 10*3/uL — ABNORMAL HIGH (ref 1.4–6.5)
Platelets: 169 10*3/uL (ref 150–440)
RBC: 5.29 MIL/uL — AB (ref 3.80–5.20)
RDW: 20.5 % — ABNORMAL HIGH (ref 11.5–14.5)
WBC: 11.1 10*3/uL — AB (ref 3.6–11.0)

## 2017-01-21 MED ORDER — IPRATROPIUM-ALBUTEROL 0.5-2.5 (3) MG/3ML IN SOLN
3.0000 mL | RESPIRATORY_TRACT | Status: DC
  Administered 2017-01-21 – 2017-01-22 (×5): 3 mL via RESPIRATORY_TRACT
  Filled 2017-01-21 (×3): qty 3

## 2017-01-21 MED ORDER — MONTELUKAST SODIUM 10 MG PO TABS
10.0000 mg | ORAL_TABLET | Freq: Every day | ORAL | Status: DC
  Administered 2017-01-21 – 2017-01-24 (×4): 10 mg via ORAL
  Filled 2017-01-21 (×4): qty 1

## 2017-01-21 MED ORDER — POLYETHYLENE GLYCOL 3350 17 G PO PACK: 17.0000 g | PACK | Freq: Every day | ORAL | Status: DC | PRN

## 2017-01-21 MED ORDER — FLUTICASONE PROPIONATE 50 MCG/ACT NA SUSP
2.0000 | Freq: Every day | NASAL | Status: DC
  Administered 2017-01-22 – 2017-01-23 (×2): 2 via NASAL
  Filled 2017-01-21 (×4): qty 16

## 2017-01-21 MED ORDER — BISOPROLOL FUMARATE 5 MG PO TABS
5.0000 mg | ORAL_TABLET | Freq: Every day | ORAL | Status: DC
  Administered 2017-01-22 – 2017-01-23 (×2): 5 mg via ORAL
  Filled 2017-01-21 (×4): qty 1

## 2017-01-21 MED ORDER — FUROSEMIDE 10 MG/ML IJ SOLN
40.0000 mg | Freq: Two times a day (BID) | INTRAMUSCULAR | Status: DC
  Administered 2017-01-22: 40 mg via INTRAVENOUS
  Filled 2017-01-21: qty 4

## 2017-01-21 MED ORDER — ASPIRIN EC 81 MG PO TBEC
81.0000 mg | DELAYED_RELEASE_TABLET | Freq: Every day | ORAL | Status: DC
  Administered 2017-01-22 – 2017-01-25 (×4): 81 mg via ORAL
  Filled 2017-01-21 (×4): qty 1

## 2017-01-21 MED ORDER — ESCITALOPRAM OXALATE 10 MG PO TABS
10.0000 mg | ORAL_TABLET | Freq: Two times a day (BID) | ORAL | Status: DC
  Administered 2017-01-21 – 2017-01-25 (×8): 10 mg via ORAL
  Filled 2017-01-21 (×9): qty 1

## 2017-01-21 MED ORDER — CLONAZEPAM 0.125 MG PO TBDP: 0.1250 mg | ORAL_TABLET | Freq: Two times a day (BID) | ORAL | Status: DC | PRN

## 2017-01-21 MED ORDER — SODIUM CHLORIDE 0.9% FLUSH
3.0000 mL | Freq: Two times a day (BID) | INTRAVENOUS | Status: DC
  Administered 2017-01-21 – 2017-01-24 (×6): 3 mL via INTRAVENOUS

## 2017-01-21 MED ORDER — NITROGLYCERIN 0.4 MG SL SUBL
0.4000 mg | SUBLINGUAL_TABLET | SUBLINGUAL | Status: DC | PRN
Start: 2017-01-21 — End: 2017-01-25

## 2017-01-21 MED ORDER — DOCUSATE SODIUM 100 MG PO CAPS: 100.0000 mg | ORAL_CAPSULE | Freq: Two times a day (BID) | ORAL | Status: DC | PRN

## 2017-01-21 MED ORDER — ALBUTEROL SULFATE (2.5 MG/3ML) 0.083% IN NEBU: 3.0000 mL | INHALATION_SOLUTION | Freq: Four times a day (QID) | RESPIRATORY_TRACT | Status: DC | PRN

## 2017-01-21 NOTE — ED Notes (Signed)
Attempted blood draw x2 without success - charge nurse aware and approved lab to be called to come and draw blood - lab notified of need to come and draw blood

## 2017-01-21 NOTE — Progress Notes (Signed)
Family Meeting Note  Advance Directive:yes  Today a meeting took place with the Patient and son and daughter.  The following clinical team members were present during this meeting:MD  The following were discussed:Patient's diagnosis: CHF with chronic respiratory failure and recurrent pleural effusion, Patient's progosis: Unable to determine and Goals for treatment: DNR  As per her family, patient has gradual decline in her functional abilities and memory for last few months, and now she has recurrent admissions because of CHF and pleural effusion. I discussed in detail with the prognosis and CODE STATUS, son who is her power of attorney clarified she would have never wanted to be resuscitated and put on the ventilator machine where it can break her ribs.  Additional follow-up to be provided: cardiology and palliative care  Time spent during discussion:20 minutes  Vaughan Basta, MD

## 2017-01-21 NOTE — ED Triage Notes (Signed)
Pt presents to ED via [facility] from [facility]. Per EMS they were called out due to patients hands turning blue, EMS reports staff then checked O2 sats on patient's hands and got a sat of 86%, pt was bumped to 4L via [place]. EMS reports initially pt's cap refill > 3 seconds, pt's tubing noted to be kinked for her oxygen. Upon arrival to ED pt noted to have O2 sats 100% on 2L via [place]. Pt denies CP, or SHOB worse than her baseline. Pt is alert and oriented, noted to be HOH.

## 2017-01-21 NOTE — Telephone Encounter (Signed)
Patient did not show for her Heart Failure [facility] appointment on 01/21/17. Will attempt to reschedule.

## 2017-01-21 NOTE — Patient Outreach (Signed)
[facility]) Care Management  01/21/2017  BRYER GOTTSCH 07/08/1923 [id number]   CSW was able to make contact with Wanda Davenport and Wanda Davenport's daughter-in-law today to perform the initial assessment, as well as assess and assist with social work needs and services, when [place] met with Wanda Davenport at [facility], [place] where Wanda Davenport currently resides to receive short-term rehabilitative services.  CSW introduced self, explained role and types of services provided through [place] [facility] ([place] Management).  CSW further explained to Wanda Davenport and Mrs. Lemaster that [place] works with Wanda Davenport's RNCM, also with [place] [facility], [name]. CSW then explained the reason for the visit, indicating that Mrs. Bertell Maria thought that Wanda Davenport would benefit from social work services and resources to assist with discharge planning needs and services from the skilled nursing facility.  CSW obtained two HIPAA compliant identifiers from Wanda Davenport, which included Wanda Davenport's name and date of birth. Wanda Davenport was working with therapies (both physical and occupational) at the time of CSW's arrival, which gave CSW an opportunity to converse with Wanda Davenport's daughter-in-law, Mrs. Ley.  Mrs. Ducharme is skeptical that Wanda Davenport will be able to return home to live alone at time of discharge from [facility], reporting that there is no one available to stay with Wanda Davenport for any length of time during the day.  Mrs. Segoviano admitted that she does not wish to keep Wanda Davenport at [facility] for long-term care services, as Wanda Davenport's late husband also resided there and they were not pleased with the care that he received.  Mrs. Zaccone went on to say that she is currently looking into other long-term care facilities in the area.  Mrs. Kist was encouraged to go ahead and apply for Long-Term Care Medicaid for Wanda Davenport at the Department of Social Services, especially if they are considering  long-term care placement arrangements for Wanda Davenport.  Mrs. Balsam voiced understanding and was agreeable to this plan. The other option that was discussed with Mrs. Matheney was in-home care for Wanda Davenport through a Location manager.  Mrs. Forbush admitted that she does not know whether or not Wanda Davenport would be able to afford 24 hour care and supervision in the home.  They are in the process of weighing all their options.  Wanda Davenport believes that she is able to return home with home health services in place, realizing that home health will only be short-term and probably only consist of nursing, physical therapy and occupational therapy.  Wanda Davenport was receiving services through [facility], prior to going into the hospital, which will need to be resumed at time of discharge from [facility].  CSW agreed to follow-up with Wanda Davenport in two weeks to assess and assist with discharge planning needs and services. [facility] CM Care Plan Problem One     Most Recent Value  Care Plan Problem One  Level of care issues.  Role Documenting the Problem One  Clinical Social Worker  Care Plan for Problem One  Active  [facility] Long Term Goal   Wanda Davenport will have home health services and durable medical equipment in place at time of discharge from the skilled nursing facility, within the next 40 days.  [facility] Long Term Goal Start Date  01/21/17  Interventions for Problem One Long Term Goal  CSW will assist Wanda Davenport and discharge planning coordinator at the skilled nursing facility with arranging home health services and durable medical equipment.  [facility] CM Short Term Goal #1   Wanda Davenport will decide on a home health agency  of choice, with regards to home care services, within the next three weeks.  [facility] CM Short Term Goal #1 Start Date  01/21/17  Interventions for Short Term Goal #1  CSW will provide Wanda Davenport with a list of home health agencies offering home care services.  [facility] CM Short Term Goal #2   Wanda Davenport will decide on an agency of choice,  with regards to durable medical equipment, within the next three weeks.  [facility] CM Short Term Goal #2 Start Date  01/21/17  Interventions for Short Term Goal #2  CSW will provide Wanda Davenport with a list of agencies offering durable medical equipment.    Nat Christen, BSW, MSW, LCSW  Licensed Education officer, [facility]  Mailing [place] [address] [place], [place] [address] Physical Address-[address], [place], [place] [address] Toll Free Main # [phone] Fax # [phone] Cell # [phone]  Office # [phone] [email or web address]

## 2017-01-21 NOTE — H&P (Signed)
[place] [facility] [place] NAME: Wanda Davenport    MR#:  [id number]  DATE OF BIRTH:  1923/07/23  DATE OF ADMISSION:  01/21/2017  PRIMARY CARE PHYSICIAN: Mellody Dance, DO   REQUESTING/REFERRING PHYSICIAN: Archie Balboa  CHIEF COMPLAINT:   Chief Complaint  Patient presents with  . Other    HISTORY OF PRESENT ILLNESS: Wanda Davenport  is a 84 y.o. female with a known history of anxiety, atypical chest pain, bronchitis, chronic systolic congestive heart failure, chronic respiratory failure requiring home oxygen, hypertension, atrial fibrillation, left bundle branch block- was admitted to hospital last week with CHF exacerbation and large left-sided pleural effusion. 1 L fluid was removed in pleural tap in hospital and she was resumed on torsemide 20 mg twice a day and sent to a rehabilitation Center because of generalized weakness.  Since yesterday she is feeling more short of breath at rehabilitation, and son also noted more swelling on her legs. Today while working with physical therapy her fingers turn view and her oxygen saturation was dropping down and they're to increase the oxygen supply to 4 L via nasal cannula so they decided to send her to emergency room.  In ER she is noted to have again large collection of pleural fluid on left side and some pulmonary edema so she is given for admission to hospitalist team.  PAST MEDICAL HISTORY:   Past Medical History:  Diagnosis Date  . Allergic rhinitis   . Anxiety   . Arthritis   . Atypical chest pain   . Bronchitis 01/14/2016  . Chronic diastolic CHF (congestive heart failure) (HCC)    Echo 06/2010 EF 60-65%, no RWMAs, grade 1 diastolic dysfunction, mild LAE, PASP 34mmHg.  Marland Kitchen Coronary artery disease    MI  . DIVERTICULOSIS, COLON   . HEMORRHOIDS, INTERNAL   . Hepatic steatosis   . Hyperlipidemia   . HYPERTENSION   . LBBB (left bundle branch block)    a. present on ECG 06/2010  . Osteopenia   .  Pre-diabetes    A1c 6.0    PAST SURGICAL HISTORY:  Past Surgical History:  Procedure Laterality Date  . ABDOMINAL HYSTERECTOMY    . BREAST SURGERY    . CATARACT EXTRACTION W/PHACO Left 05/12/2014   Procedure: CATARACT EXTRACTION PHACO AND INTRAOCULAR LENS PLACEMENT (IOC);  Surgeon: Leandrew Koyanagi, MD;  Location: [facility];  Service: Ophthalmology;  Laterality: Left;  . COLONOSCOPY     a. 2010  . CORONARY STENT INTERVENTION N/A 04/26/2016   Procedure: Coronary Stent Intervention;  Surgeon: Isaias Cowman, MD;  Location: [facility];  Service: Cardiovascular;  Laterality: N/A;  . LEFT HEART CATH AND CORONARY ANGIOGRAPHY N/A 04/26/2016   Procedure: Left Heart Cath and Coronary Angiography;  Surgeon: Isaias Cowman, MD;  Location: [facility];  Service: Cardiovascular;  Laterality: N/A;  . LEFT HEART CATH AND CORONARY ANGIOGRAPHY N/A 05/05/2016   Procedure: Left Heart Cath and Coronary Angiography;  Surgeon: Wellington Hampshire, MD;  Location: [facility];  Service: Cardiovascular;  Laterality: N/A;  . TONSILLECTOMY      SOCIAL HISTORY:  Social History   Tobacco Use  . Smoking status: Former Smoker    Packs/day: 0.25    Years: 20.00    Pack years: 5.00    Types: Cigarettes    Last attempt to quit: 01/02/1971    Years since quitting: 46.0  . Smokeless tobacco: Never Used  . Tobacco comment: smoked a  few cigarettes/day x 20 yrs - quit @ age 36.  Substance Use Topics  . Alcohol use: No    FAMILY HISTORY:  Family History  Problem Relation Age of Onset  . Colon cancer Sister   . Melanoma Sister   . Hyperlipidemia Brother   . Hypertension Mother   . Lupus Mother        died 71 from surgical complications  . Alzheimer's disease Brother        unknown  . Kidney disease Father        died 46  . Hypertension Father   . Alcohol abuse Father     DRUG ALLERGIES:  Allergies  Allergen Reactions  . Fenofibrate Other (See Comments)     Myalgias/gi upset   . Statins Other (See Comments)    Myalgias   . Sulfonamide Derivatives Nausea And Vomiting    REACTION: nausea    REVIEW OF SYSTEMS:   CONSTITUTIONAL: No fever, positive for fatigue or weakness.  EYES: No blurred or double vision.  EARS, NOSE, AND THROAT: No tinnitus or ear pain.  RESPIRATORY: No cough, she have shortness of breath, no wheezing or hemoptysis.  CARDIOVASCULAR: No chest pain, orthopnea, edema.  GASTROINTESTINAL: No nausea, vomiting, diarrhea or abdominal pain.  GENITOURINARY: No dysuria, hematuria.  ENDOCRINE: No polyuria, nocturia,  HEMATOLOGY: No anemia, easy bruising or bleeding SKIN: No rash or lesion. MUSCULOSKELETAL: No joint pain or arthritis.   NEUROLOGIC: No tingling, numbness, weakness.  PSYCHIATRY: No anxiety or depression.   MEDICATIONS AT HOME:  Prior to Admission medications   Medication Sig Start Date End Date Taking? Authorizing Provider  albuterol (PROVENTIL HFA;VENTOLIN HFA) 108 (90 BASE) MCG/ACT inhaler Inhale 2 puffs into the lungs every 6 (six) hours as needed. Wheezing or shortness of breath   Yes [provider]  apixaban (ELIQUIS) 5 MG TABS tablet Take 10 mg (2 tabs) twice a day till 05/22/16 and then from 05/23/16 take 1 tab (5 mg ) two times a day 05/17/16  Yes Fritzi Mandes, MD  aspirin 81 MG tablet Take by mouth daily.    Yes [provider]  bisoprolol (ZEBETA) 10 MG tablet Take 0.5 tablets (5 mg total) by mouth daily. 10/15/16 01/18/18 Yes Opalski, Neoma Laming, DO  clonazepam (KLONOPIN) 0.125 MG disintegrating tablet Take 1 tablet (0.125 mg total) by mouth 2 (two) times daily as needed (anxiety). 01/17/17  Yes Sainani, Belia Heman, MD  escitalopram (LEXAPRO) 10 MG tablet Take 1 tablet (10 mg total) by mouth 2 (two) times daily. 10/15/16  Yes Opalski, Deborah, DO  fluticasone (FLONASE) 50 MCG/ACT nasal spray Place 2 sprays into the nose daily. Nasal congestion Rarely used    Yes [provider]   ipratropium-albuterol (DUONEB) 0.5-2.5 (3) MG/3ML SOLN Take 3 mLs by nebulization 2 (two) times daily.   Yes [provider]  montelukast (SINGULAIR) 10 MG tablet Take 1 tablet (10 mg total) by mouth at bedtime. 08/14/16  Yes Danford, Valetta Fuller D, NP  nitroGLYCERIN (NITROSTAT) 0.4 MG SL tablet Place under the tongue every 5 (five) minutes as needed for chest pain.    Yes [provider]  polyethylene glycol (MIRALAX / GLYCOLAX) packet Take 17 g by mouth daily as needed for mild constipation. 01/17/17  Yes Henreitta Leber, MD  torsemide (DEMADEX) 20 MG tablet Take 2 tablets by mouth 2 (two) times daily. 01/10/17  Yes [provider]      PHYSICAL EXAMINATION:   VITAL SIGNS: Blood pressure 116/68, pulse 63, temperature (!)  97.3 F (36.3 C), temperature source Oral, resp. rate (!) 24, height 5\' 3"  (1.6 m), weight 64.9 kg (143 lb), SpO2 100 %.  GENERAL:  84 y.o.-year-old patient lying in the bed with no acute distress.  EYES: Pupils equal, round, reactive to light and accommodation. No scleral icterus. Extraocular muscles intact.  HEENT: Head atraumatic, normocephalic. Oropharynx and nasopharynx clear.  NECK:  Supple, no jugular venous distention. No thyroid enlargement, no tenderness.  LUNGS: Decreased breath sound on left side, no wheezing, rales,rhonchi or crepitation. No use of accessory muscles of respiration.  CARDIOVASCULAR: S1, S2 normal. No murmurs, rubs, or gallops.  ABDOMEN: Soft, nontender, nondistended. Bowel sounds present. No organomegaly or mass.  EXTREMITIES: Bilateral pedal edema, no cyanosis, or clubbing.  NEUROLOGIC: Cranial nerves II through XII are intact. Muscle strength 3-4/5 in all extremities. Sensation intact. Gait not checked.  PSYCHIATRIC: The patient is alert and oriented x 3.  SKIN: No obvious rash, lesion, or ulcer.   LABORATORY PANEL:   CBC Recent Labs  Lab 01/21/17 1643  WBC 11.1*  HGB 11.5*  HCT 37.2  PLT 169  MCV 70.4*  MCH  21.7*  MCHC 30.8*  RDW 20.5*  LYMPHSABS 1.4  MONOABS 1.4*  EOSABS 0.2  BASOSABS 0.1   ------------------------------------------------------------------------------------------------------------------  Chemistries  Recent Labs  Lab 01/15/17 0518 01/16/17 0441 01/17/17 0451 01/21/17 1643  NA 136 136 130* 142  K 4.6 4.5 4.2 4.0  CL 103 103 98* 104  CO2 22 23 21* 23  GLUCOSE 99 96 84 100*  BUN 52* 57* 54* 40*  CREATININE 1.87* 1.76* 1.60* 1.38*  CALCIUM 10.0 9.9 9.5 10.0   ------------------------------------------------------------------------------------------------------------------ estimated creatinine clearance is 23.6 mL/min (A) (by C-G formula based on SCr of 1.38 mg/dL (H)). ------------------------------------------------------------------------------------------------------------------ No results for input(s): TSH, T4TOTAL, T3FREE, THYROIDAB in the last 72 hours.  Invalid input(s): FREET3   Coagulation profile No results for input(s): INR, PROTIME in the last 168 hours. ------------------------------------------------------------------------------------------------------------------- No results for input(s): DDIMER in the last 72 hours. -------------------------------------------------------------------------------------------------------------------  Cardiac Enzymes Recent Labs  Lab 01/21/17 1643  TROPONINI <0.03   ------------------------------------------------------------------------------------------------------------------ Invalid input(s): POCBNP  ---------------------------------------------------------------------------------------------------------------  Urinalysis    Component Value Date/Time   COLORURINE YELLOW 09/24/2011 0150   APPEARANCEUR CLOUDY (A) 09/24/2011 0150   LABSPEC 1.022 09/24/2011 0150   PHURINE 5.5 09/24/2011 0150   GLUCOSEU NEGATIVE 09/24/2011 0150   HGBUR NEGATIVE 09/24/2011 0150   BILIRUBINUR NEGATIVE 09/24/2011 0150    KETONESUR NEGATIVE 09/24/2011 0150   PROTEINUR NEGATIVE 09/24/2011 0150   UROBILINOGEN 0.2 09/24/2011 0150   NITRITE NEGATIVE 09/24/2011 0150   LEUKOCYTESUR SMALL (A) 09/24/2011 0150     RADIOLOGY: Dg Chest 2 View  Result Date: 01/21/2017 CLINICAL DATA:  Shortness of breath and hypoxia. EXAM: CHEST  2 VIEW COMPARISON:  Chest x-ray dated January 17, 2017. FINDINGS: Stable cardiomegaly. Mild vascular congestion is unchanged. Interval increase in size of now large left and small right pleural effusions. Worsened bibasilar atelectasis. No acute osseous abnormality. IMPRESSION: Congestive heart failure with interval increase in size of large left and small right pleural effusions. Electronically Signed   By: Titus Dubin M.D.   On: 01/21/2017 16:18    EKG: Orders placed or performed during the hospital encounter of 01/21/17  . ED EKG  . ED EKG  . EKG 12-Lead  . EKG 12-Lead    IMPRESSION AND PLAN:  * Acute on chronic respiratory failure   Continue supplemental oxygen via nasal cannula.  * Acute on chronic systolic congestive  heart failure   Start on IV Lasix, intake and output measurement and fluid restrictions.   Echocardiogram was done in April 2018 with EF of 40%.   Cardiology consult. Palliative care Consult as this is recurrent problem and gradual decline in her functional status in last few months.  * Left-sided pleural effusion   She will need thoracentesis again   I will hold anticoagulants for now. May decide if resume or not, after the procedure.  * Atrial fibrillation   Continue bisoprolol, hold anti-coagulation at this time.  * COPD   No exacerbation symptoms.   Continue nebulizer and home inhalers.  * Functional decline   As per her daughter and son, last few months she has gradual decline in overall abilities and her memory.   I will call palliative care consult to discuss goals of care.  All the records are reviewed and case discussed with ED  provider. Management plans discussed with the patient, family and they are in agreement.  CODE STATUS: DO NOT RESUSCITATE Code Status History    Date Active Date Inactive Code Status Order ID Comments User Context   01/12/2017 12:56 01/17/2017 23:00 Full Code [id number]  Demetrios Loll, MD Inpatient   05/20/2016 00:47 05/21/2016 17:02 Full Code [id number]  Lance Coon, MD Inpatient   05/15/2016 12:34 05/17/2016 18:08 Full Code [id number]  Fritzi Mandes, MD Inpatient   05/05/2016 03:31 05/06/2016 15:28 Full Code [id number]  Wellington Hampshire, MD Inpatient   04/26/2016 17:12 05/01/2016 03:49 Full Code [id number]  Nicholes Mango, MD Inpatient   04/26/2016 14:08 04/26/2016 17:12 Full Code [id number]  Isaias Cowman, MD Inpatient   09/23/2011 14:11 09/26/2011 16:43 Full Code [id number]  Willette Pa, RN Inpatient    Advance Directive Documentation     Most Recent Value  Type of Advance Directive  Living will  Pre-existing out of facility DNR order (yellow form or pink MOST form)  No data  "MOST" Form in Place?  No data     Discussed with her son and daughter in the room.  TOTAL TIME TAKING CARE OF THIS PATIENT: 50 minutes.    Vaughan Basta M.D on 01/21/2017   Between 7am to 6pm - Pager - [phone]  After 6pm go to [email or web address] - password [email or web address] [facility]  Office  [phone]  CC: Primary care physician; Mellody Dance, DO   Note: This dictation was prepared with Dragon dictation along with smaller phrase technology. Any transcriptional errors that result from this process are unintentional.

## 2017-01-21 NOTE — ED Notes (Signed)
Notified that bed was assigned

## 2017-01-21 NOTE — ED Provider Notes (Signed)
[facility] Emergency Department Provider Note   ____________________________________________   I have reviewed the triage vital signs and the nursing notes.   HISTORY  Chief Complaint Shortness of breath  History limited by: Not Limited   HPI Wanda Davenport is a 84 y.o. female who presents to the emergency department today from living facility because of concern for shortness of breath and hypoxia. The patient was recently discharged from the hospital after an admission secondary to CHF exacerbation. The patient's family states that she has been slowly getting worse since leaving the hospital. Today the patient was getting ready for physical therapy when she started having worsening shortness of breath. Staff noticed that she began having cyanotic extremities. O2 went into the mid 80s. Apparently when EMS arrived they noticed that her o2 line was kinked. After fixing this her o2 levels did start to improve. She has also been having some worsening swelling of the lower extremities. No fevers.    Per medical record review patient has a history of recent admission for CHF exacerbation.  Past Medical History:  Diagnosis Date  . Allergic rhinitis   . Anxiety   . Arthritis   . Atypical chest pain   . Bronchitis 01/14/2016  . Chronic diastolic CHF (congestive heart failure) (HCC)    Echo 06/2010 EF 60-65%, no RWMAs, grade 1 diastolic dysfunction, mild LAE, PASP 45mmHg.  Marland Kitchen Coronary artery disease    MI  . DIVERTICULOSIS, COLON   . HEMORRHOIDS, INTERNAL   . Hepatic steatosis   . Hyperlipidemia   . HYPERTENSION   . LBBB (left bundle branch block)    a. present on ECG 06/2010  . Osteopenia   . Pre-diabetes    A1c 6.0    Patient Active Problem List   Diagnosis Date Noted  . Acute on chronic systolic CHF (congestive heart failure) ([place]) 01/12/2017  . Bilateral leg edema 09/05/2016  . Sinusitis, acute maxillary 07/12/2016  . Cough 07/12/2016  . Shortness of  breath 07/12/2016  . Pleural effusion, left 07/12/2016  . Chronic systolic heart failure ([place]) 06/15/2016  . Acute MI anterior wall subsequent episode care ([place]) 06/04/2016  . PE (pulmonary thromboembolism) ([place]) 05/19/2016  . Major depression in remission ([place]) 05/07/2016  . Pneumonia, community acquired 05/07/2016  . Unstable angina ([place])   . Coronary artery disease of native artery of native heart with stable angina pectoris ([place]) 05/01/2016  . STEMI (ST elevation myocardial infarction) ([place]) 04/26/2016  . Knee pain, bilateral 04/10/2016  . Medication monitoring encounter 01/26/2016  . Intolerance of drug- statins 01/25/2016  . Counseling regarding end of life decision making 01/25/2016  . Hypertriglyceridemia 12/13/2015  . Low serum HDL 12/13/2015  . B12 deficiency 12/13/2015  . Claudication of left lower extremity ([place]) 12/13/2015  . Vitamin D deficiency 11/06/2015  . Adjustment disorder with mixed anxiety and depressed mood 11/06/2015  . Basal cell carcinoma 10/31/2015  . Environmental and seasonal allergies 10/31/2015  . Reactive airway disease- only spring and fall due to allergies 10/31/2015  . GAD (generalized anxiety disorder) 10/13/2015  . Atypical chest pain 04/16/2011  . Hyperlipidemia   . Pre-diabetes   . chronic LBBB (left bundle branch block)- since 2012   . Generalized OA   . Allergic rhinitis   . DIVERTICULOSIS, COLON 12/03/2007  . HTN (hypertension) 04/26/2007    Past Surgical History:  Procedure Laterality Date  . ABDOMINAL HYSTERECTOMY    . BREAST SURGERY    . CATARACT EXTRACTION W/PHACO Left 05/12/2014  Procedure: CATARACT EXTRACTION PHACO AND INTRAOCULAR LENS PLACEMENT (IOC);  Surgeon: Leandrew Koyanagi, MD;  Location: [place];  Service: Ophthalmology;  Laterality: Left;  . COLONOSCOPY     a. 2010  . CORONARY STENT INTERVENTION N/A 04/26/2016   Procedure: Coronary Stent Intervention;  Surgeon: Isaias Cowman, MD;  Location: [place] [facility];  Service: Cardiovascular;  Laterality: N/A;  . LEFT HEART CATH AND CORONARY ANGIOGRAPHY N/A 04/26/2016   Procedure: Left Heart Cath and Coronary Angiography;  Surgeon: Isaias Cowman, MD;  Location: [place] [facility];  Service: Cardiovascular;  Laterality: N/A;  . LEFT HEART CATH AND CORONARY ANGIOGRAPHY N/A 05/05/2016   Procedure: Left Heart Cath and Coronary Angiography;  Surgeon: Wellington Hampshire, MD;  Location: [place] [facility];  Service: Cardiovascular;  Laterality: N/A;  . TONSILLECTOMY      Prior to Admission medications   Medication Sig Start Date End Date Taking? Authorizing Provider  albuterol (PROVENTIL HFA;VENTOLIN HFA) 108 (90 BASE) MCG/ACT inhaler Inhale 2 puffs into the lungs every 6 (six) hours as needed. Wheezing or shortness of breath    [provider]  apixaban (ELIQUIS) 5 MG TABS tablet Take 10 mg (2 tabs) twice a day till 05/22/16 and then from 05/23/16 take 1 tab (5 mg ) two times a day 05/17/16   Fritzi Mandes, MD  aspirin 81 MG tablet Take by mouth daily.     [provider]  bisoprolol (ZEBETA) 10 MG tablet Take 0.5 tablets (5 mg total) by mouth daily. 10/15/16 10/31/16  Opalski, Neoma Laming, DO  clonazepam (KLONOPIN) 0.125 MG disintegrating tablet Take 1 tablet (0.125 mg total) by mouth 2 (two) times daily as needed (anxiety). 01/17/17   Henreitta Leber, MD  escitalopram (LEXAPRO) 10 MG tablet Take 1 tablet (10 mg total) by mouth 2 (two) times daily. 10/15/16   Opalski, Deborah, DO  fluticasone (FLONASE) 50 MCG/ACT nasal spray Place 2 sprays into the nose daily. Nasal congestion Rarely used     [provider]  montelukast (SINGULAIR) 10 MG tablet Take 1 tablet (10 mg total) by mouth at bedtime. 08/14/16   Danford, Valetta Fuller D, NP  nitroGLYCERIN (NITROSTAT) 0.4 MG SL tablet Place under the tongue every 5 (five) minutes as needed for chest pain.     [provider]  polyethylene glycol (MIRALAX / GLYCOLAX) packet Take 17 g  by mouth daily as needed for mild constipation. 01/17/17   Henreitta Leber, MD  torsemide (DEMADEX) 20 MG tablet Take 2 tablets by mouth 2 (two) times daily. 01/10/17   [provider]    Allergies Fenofibrate; Statins; and Sulfonamide derivatives  Family History  Problem Relation Age of Onset  . Colon cancer Sister   . Melanoma Sister   . Hyperlipidemia Brother   . Hypertension Mother   . Lupus Mother        died 19 from surgical complications  . Alzheimer's disease Brother        unknown  . Kidney disease Father        died 64  . Hypertension Father   . Alcohol abuse Father     Social History Social History   Tobacco Use  . Smoking status: Former Smoker    Packs/day: 0.25    Years: 20.00    Pack years: 5.00    Types: Cigarettes    Last attempt to quit: 01/02/1971    Years since quitting: 46.0  . Smokeless tobacco: Never Used  . Tobacco comment: smoked  a few cigarettes/day x 20 yrs - quit @ age 44.  Substance Use Topics  . Alcohol use: No  . Drug use: No    Review of Systems Constitutional: No fever/chills Eyes: No visual changes. ENT: No sore throat. Cardiovascular: Positive for chest pressure earlier today. Respiratory: Positive for shortness of breath. Gastrointestinal: No abdominal pain.  No nausea, no vomiting.  No diarrhea.   Genitourinary: Negative for dysuria. Musculoskeletal: Positive for lower leg swelling. Skin: Negative for rash. Neurological: Negative for headaches, focal weakness or numbness.  ____________________________________________   PHYSICAL EXAM:  VITAL SIGNS: ED Triage Vitals  Enc Vitals Group     BP 01/21/17 1516 112/66     Pulse Rate 01/21/17 1516 63     Resp --      Temp 01/21/17 1521 (!) 97.3 F (36.3 C)     Temp Source 01/21/17 1521 Oral     SpO2 01/21/17 1508 100 %     Weight 01/21/17 1513 143 lb (64.9 kg)     Height 01/21/17 1513 5\' 3"  (1.6 m)     Head Circumference --      Peak Flow --      Pain Score  01/21/17 1513 0   Constitutional: Alert and oriented. Well appearing and in no distress. Eyes: Conjunctivae are normal.  ENT   Head: Normocephalic and atraumatic.   Nose: No congestion/rhinnorhea.   Mouth/Throat: Mucous membranes are moist.   Neck: No stridor. Hematological/Lymphatic/Immunilogical: No cervical lymphadenopathy. Cardiovascular: Normal rate, regular rhythm.  No murmurs, rubs, or gallops.  Respiratory: Normal respiratory effort without tachypnea nor retractions. Breath sounds are clear and equal bilaterally. No wheezes/rales/rhonchi. Gastrointestinal: Soft and non tender. No rebound. No guarding.  Genitourinary: Deferred Musculoskeletal: Normal range of motion in all extremities. Trace lower extremity pitting edema.  Neurologic:  Normal speech and language. No gross focal neurologic deficits are appreciated.  Skin:  Skin is warm, dry and intact. No rash noted. Psychiatric: Mood and affect are normal. Speech and behavior are normal. Patient exhibits appropriate insight and judgment.  ____________________________________________    LABS (pertinent positives/negatives)  Trop <0.03 CBC wbc 11.1, hgb 11.5, plt 169 BMP cr 1.38, glu 100  ____________________________________________   EKG  I, Nance Pear, attending physician, personally viewed and interpreted this EKG  EKG Time: 1734 Rate: 64 Rhythm: atrial flutter Axis: left axis deviation Intervals: qtc 489 QRS: LBBB ST changes: no st elevation Impression: abnormal ekg   ____________________________________________    RADIOLOGY  CXR Large left pleural effusion with much smaller right pleural effusion  I, Kymir Coles, personally viewed and evaluated these images (plain radiographs) as part of my medical decision making. ____________________________________________   PROCEDURES  Procedures  ____________________________________________   INITIAL IMPRESSION / ASSESSMENT AND PLAN  / ED COURSE  Pertinent labs & imaging results that were available during my care of the patient were reviewed by me and considered in my medical decision making (see chart for details).  Patient presented due to shortness of breath, hypoxia. Were some concerns for mechanical issues (kinking of o2 tube) however family also does state that patient has been getting worse recently. Concern for recurrent pleural effusion, CHF exacerbation, cardiac etiology, pna, influenza, amongst other etiologies. The patient's cxr does show recurrence of large left sided pleural effusion. Do think patient would benefit from repeat thoracentesis. Additionally ekg consistent with atrial flutter. Will admit to hospitalists. Discussed with family.  ____________________________________________   FINAL CLINICAL IMPRESSION(S) / ED DIAGNOSES  Final diagnoses:  Hypoxia  Shortness  of breath  Pleural effusion  Atrial flutter, unspecified type [facility])     Note: This dictation was prepared with Dragon dictation. Any transcriptional errors that result from this process are unintentional     Nance Pear, MD 01/21/17 1811

## 2017-01-21 NOTE — Progress Notes (Deleted)
Patient ID: Wanda Davenport, female    DOB: June 07, 1923, 84 y.o.   MRN: [id number]  HPI  Wanda Davenport is a 84 y/o female with a history of atypical chest pain, STEMI with stent, anxiety, diverticulosis, hepatic steatosis, hyperlipidemia, HTN, LBBB, osteopenia, remote tobacco use, chronic oxygen use and chronic heart failure  Reviewed last echo report from 04/26/16 which showed an EF of 35-40% along with mild MR/TR. EF has decreased from 2013 when it was 45-50%. Cardiac catheterization done 04/26/16 due to STEMI showing 100% stenosis of ost LAD to prox LAD. Drug eluting stent place with 0% residual stenosis post intervention. Another catheterization was done 05/05/16 and showed widely patent LAD stent without stent thrombosis.  Admitted 01/12/17 due to HF exacerbation. Cardiology consult obtained. Initially needed IV diuretics and then transitioned to oral diuretics. Had left-sided thoracentesis done with removal of 1L of fluid. Discharged after 5 days. Was in the ED 12/18/16 due to HF exacerbation. Ultrasound was negative for DVT. Diuretics adjusted and she was released.   She presents today for her follow-up visit with a chief complaint of   Past Medical History:  Diagnosis Date  . Allergic rhinitis   . Anxiety   . Arthritis   . Atypical chest pain   . Bronchitis 01/14/2016  . Chronic diastolic CHF (congestive heart failure) (HCC)    Echo 06/2010 EF 60-65%, no RWMAs, grade 1 diastolic dysfunction, mild LAE, PASP 25mmHg.  Marland Kitchen Coronary artery disease    MI  . DIVERTICULOSIS, COLON   . HEMORRHOIDS, INTERNAL   . Hepatic steatosis   . Hyperlipidemia   . HYPERTENSION   . LBBB (left bundle branch block)    a. present on ECG 06/2010  . Osteopenia   . Pre-diabetes    A1c 6.0   Past Surgical History:  Procedure Laterality Date  . ABDOMINAL HYSTERECTOMY    . BREAST SURGERY    . CATARACT EXTRACTION W/PHACO Left 05/12/2014   Procedure: CATARACT EXTRACTION PHACO AND INTRAOCULAR LENS PLACEMENT (IOC);   Surgeon: Leandrew Koyanagi, MD;  Location: [place];  Service: Ophthalmology;  Laterality: Left;  . COLONOSCOPY     a. 2010  . CORONARY STENT INTERVENTION N/A 04/26/2016   Procedure: Coronary Stent Intervention;  Surgeon: Isaias Cowman, MD;  Location: [place] [facility];  Service: Cardiovascular;  Laterality: N/A;  . LEFT HEART CATH AND CORONARY ANGIOGRAPHY N/A 04/26/2016   Procedure: Left Heart Cath and Coronary Angiography;  Surgeon: Isaias Cowman, MD;  Location: [place] [facility];  Service: Cardiovascular;  Laterality: N/A;  . LEFT HEART CATH AND CORONARY ANGIOGRAPHY N/A 05/05/2016   Procedure: Left Heart Cath and Coronary Angiography;  Surgeon: Wellington Hampshire, MD;  Location: [place] [facility];  Service: Cardiovascular;  Laterality: N/A;  . TONSILLECTOMY     Family History  Problem Relation Age of Onset  . Colon cancer Sister   . Melanoma Sister   . Hyperlipidemia Brother   . Hypertension Mother   . Lupus Mother        died 63 from surgical complications  . Alzheimer's disease Brother        unknown  . Kidney disease Father        died 81  . Hypertension Father   . Alcohol abuse Father    Social History   Tobacco Use  . Smoking status: Former Smoker    Packs/day: 0.25    Years: 20.00    Pack years: 5.00    Types: Cigarettes  Last attempt to quit: 01/02/1971    Years since quitting: 46.0  . Smokeless tobacco: Never Used  . Tobacco comment: smoked a few cigarettes/day x 20 yrs - quit @ age 39.  Substance Use Topics  . Alcohol use: No   Allergies  Allergen Reactions  . Fenofibrate Other (See Comments)    Myalgias/gi upset   . Statins Other (See Comments)    Myalgias   . Sulfonamide Derivatives Nausea And Vomiting    REACTION: nausea    Review of Systems  Constitutional: Negative for appetite change and fatigue.  HENT: Positive for congestion (ear congestion). Negative for postnasal drip and sore throat.   Eyes: Negative.    Respiratory: Positive for shortness of breath. Negative for cough and chest tightness.   Cardiovascular: Negative for chest pain, palpitations and leg swelling.  Gastrointestinal: Negative for abdominal distention and abdominal pain.  Endocrine: Negative.   Genitourinary: Negative.   Musculoskeletal: Positive for neck pain. Negative for back pain.  Skin: Negative.   Allergic/Immunologic: Negative.   Neurological: Negative for dizziness and light-headedness.  Hematological: Negative for adenopathy. Does not bruise/bleed easily.  Psychiatric/Behavioral: Negative for dysphoric mood and sleep disturbance (sleeping on 2 pillows with oxygen at 2L around the clock). The patient is not nervous/anxious.      Physical Exam  Constitutional: She is oriented to person, place, and time. She appears well-developed and well-nourished.  HENT:  Head: Normocephalic and atraumatic.  Neck: Normal range of motion. Neck supple. No JVD present.  Cardiovascular: Normal rate and regular rhythm.  Pulmonary/Chest: Effort normal. She has no wheezes. She has no rales.  Abdominal: Soft. She exhibits no distension. There is no tenderness.  Musculoskeletal: She exhibits no edema or tenderness.  Neurological: She is alert and oriented to person, place, and time.  Skin: Skin is warm and dry.  Psychiatric: She has a normal mood and affect. Her behavior is normal. Thought content normal.  Nursing note and vitals reviewed.   Assessment & Plan:  1: Chronic heart failure with reduced ejection fraction- - NYHA class II - euvolemic today - already weighing daily and she has parameters to take an extra furosemide for an overnight weight gain of 3 pounds or more. Also advised her to call for a weekly weight gain of >5 pounds - not adding salt and her daughter doesn't cook with salt either. Encouraged her to continue following a 2000mg  sodium diet - may be limited in adding entresto due to blood pressure - currently has  [name] home health coming out - currently taking furosemide 40mg  AM/ 20mg  PM - REDS vest reading was 32% - saw cardiologist Nehemiah Massed) 01/10/17 - BNP on 01/12/17 was 2508.0  2: HTN- - BP on the low side - denies dizziness - saw PCP (Opalski) 10/152/18 - BMP from 01/17/17 reviewed and showed sodium 130, potassium 4.2 and GFR 27  Medication bottles were reviewed.    Return here in 1 month or sooner for any questions/problems before then.

## 2017-01-22 ENCOUNTER — Inpatient Hospital Stay: Payer: Medicare HMO

## 2017-01-22 DIAGNOSIS — Z515 Encounter for palliative care: Secondary | ICD-10-CM

## 2017-01-22 DIAGNOSIS — R0902 Hypoxemia: Secondary | ICD-10-CM

## 2017-01-22 DIAGNOSIS — Z7189 Other specified counseling: Secondary | ICD-10-CM

## 2017-01-22 DIAGNOSIS — I4892 Unspecified atrial flutter: Secondary | ICD-10-CM

## 2017-01-22 DIAGNOSIS — E440 Moderate protein-calorie malnutrition: Secondary | ICD-10-CM

## 2017-01-22 DIAGNOSIS — J90 Pleural effusion, not elsewhere classified: Secondary | ICD-10-CM

## 2017-01-22 DIAGNOSIS — I5023 Acute on chronic systolic (congestive) heart failure: Secondary | ICD-10-CM

## 2017-01-22 DIAGNOSIS — J9621 Acute and chronic respiratory failure with hypoxia: Secondary | ICD-10-CM

## 2017-01-22 LAB — BASIC METABOLIC PANEL
ANION GAP: 10 (ref 5–15)
BUN: 37 mg/dL — ABNORMAL HIGH (ref 6–20)
CALCIUM: 9.5 mg/dL (ref 8.9–10.3)
CO2: 27 mmol/L (ref 22–32)
Chloride: 101 mmol/L (ref 101–111)
Creatinine, Ser: 1.17 mg/dL — ABNORMAL HIGH (ref 0.44–1.00)
GFR, EST AFRICAN AMERICAN: 45 mL/min — AB (ref >60)
GFR, EST NON AFRICAN AMERICAN: 39 mL/min — AB (ref >60)
GLUCOSE: 94 mg/dL (ref 65–99)
POTASSIUM: 3.3 mmol/L — AB (ref 3.5–5.1)
Sodium: 138 mmol/L (ref 135–145)

## 2017-01-22 LAB — CBC
HEMATOCRIT: 35.2 % (ref 35.0–47.0)
HEMOGLOBIN: 10.9 g/dL — AB (ref 12.0–16.0)
MCH: 22 pg — ABNORMAL LOW (ref 26.0–34.0)
MCHC: 31 g/dL — ABNORMAL LOW (ref 32.0–36.0)
MCV: 70.9 fL — ABNORMAL LOW (ref 80.0–100.0)
Platelets: 156 10*3/uL (ref 150–440)
RBC: 4.97 MIL/uL (ref 3.80–5.20)
RDW: 19.9 % — ABNORMAL HIGH (ref 11.5–14.5)
WBC: 7 10*3/uL (ref 3.6–11.0)

## 2017-01-22 LAB — PROTEIN, PLEURAL OR PERITONEAL FLUID

## 2017-01-22 LAB — GLUCOSE, PLEURAL OR PERITONEAL FLUID: Glucose, Fluid: 113 mg/dL

## 2017-01-22 LAB — BODY FLUID CELL COUNT WITH DIFFERENTIAL
Eos, Fluid: 0 %
LYMPHS FL: 51 %
Monocyte-Macrophage-Serous Fluid: 46 %
NEUTROPHIL FLUID: 3 %
Total Nucleated Cell Count, Fluid: 158 cu mm

## 2017-01-22 MED ORDER — ENSURE ENLIVE PO LIQD
237.0000 mL | Freq: Three times a day (TID) | ORAL | Status: DC
  Administered 2017-01-22 – 2017-01-25 (×8): 237 mL via ORAL

## 2017-01-22 MED ORDER — IPRATROPIUM-ALBUTEROL 0.5-2.5 (3) MG/3ML IN SOLN
3.0000 mL | Freq: Four times a day (QID) | RESPIRATORY_TRACT | Status: DC
  Administered 2017-01-23 – 2017-01-24 (×8): 3 mL via RESPIRATORY_TRACT
  Filled 2017-01-22 (×8): qty 3

## 2017-01-22 MED ORDER — ALBUTEROL SULFATE (2.5 MG/3ML) 0.083% IN NEBU: 3.0000 mL | INHALATION_SOLUTION | RESPIRATORY_TRACT | Status: DC | PRN

## 2017-01-22 MED ORDER — APIXABAN 5 MG PO TABS
5.0000 mg | ORAL_TABLET | Freq: Two times a day (BID) | ORAL | Status: DC
  Administered 2017-01-23 – 2017-01-25 (×5): 5 mg via ORAL
  Filled 2017-01-22 (×5): qty 1

## 2017-01-22 MED ORDER — FUROSEMIDE 10 MG/ML IJ SOLN: 40.0000 mg | Freq: Every day | INTRAMUSCULAR | Status: DC

## 2017-01-22 NOTE — Progress Notes (Signed)
Initial Nutrition Assessment  DOCUMENTATION CODES:   Non-severe (moderate) malnutrition in context of chronic illness  INTERVENTION:   - Ensure Enlive po TID, each supplement provides 350 kcal and 20 grams of protein - Encourage PO intake  NUTRITION DIAGNOSIS:   Moderate Malnutrition related to chronic illness(CHF, COPD) as evidenced by moderate muscle depletion, moderate fat depletion.  GOAL:   Patient will meet greater than or equal to 90% of their needs  MONITOR:   PO intake, Supplement acceptance, I & O's, Labs  REASON FOR ASSESSMENT:   Malnutrition Screening Tool   ASSESSMENT:   84 yo female who presented to ED from rehab facility for SOB and hypoxia. Pt being treated for acute on chronic respiratory failure, CHF, left-sided pleural effusion, a-fib, COPD, and functional decline. Pt with recent admission for CHF exacerbation. Pt with PMH also significant for CAD, hyperlipidemia, HTN, and pre-diabetes. Consult to palliative care. Per MD note, pt will need thoracentesis again.   Pt is s/p thoracentesis this morning that yielded 600 mL of clear, yellow fluid.  Pt also met with Palliative NP this morning. Per note, pt is "okay with nursing home placement."  Spoke with pt, pt's daughter, and pt's brother at bedside. Pt reports poor appetite for the past 6-12 months. Pt's daughter states pt "eats like a bird" and "feels full all the time because of fluid." Pt tries to watch her sodium intake.  Pt typically eats 3 meals daily, and pt's family brings snacks to her at the rehab facility. When pt lived at home, a typical dinner might include a hamburger or beans. Pt enjoys eating rice cakes and cookies and drinking coffee, Coke, hot cocoa, ginger ale, and orange soda. Pt does not like water. Pt states she ate part of an omelet for breakfast this morning. Pt with meal tray in room at time of visit and had consumed approximately 50% of the chicken, 100% of the sherbet, 0% of the cole  slaw, and 0% of the sweet potato.  Pt states that her weight is "up and down" and that her typical weight is between 130-140 lbs. Per weight history in chart, pt's weight has ranged from 143-168 lbs since May 2018. Pt's daughter and brother report that they have noticed pt's weight loss over the past 6 months.  Pt agreeable to having Ensure Enlive oral nutrition supplement TID with meals as she has tried oral nutrition supplements in the past.  Medications reviewed and include: Lasix  Labs reviewed: potassium 3.3 (L), BUN 37 (H), creatinine 1.17 (H), hemoglobin A1C 5.6 (04/27/16)  NUTRITION - FOCUSED PHYSICAL EXAM:    Most Recent Value  Orbital Region  Mild depletion  Upper Arm Region  Moderate depletion  Thoracic and Lumbar Region  Moderate depletion  Buccal Region  Mild depletion  Temple Region  Mild depletion  Clavicle Bone Region  Moderate depletion  Clavicle and Acromion Bone Region  Moderate depletion  Scapular Bone Region  Unable to assess  Dorsal Hand  Mild depletion  Patellar Region  Moderate depletion  Anterior Thigh Region  Moderate depletion  Posterior Calf Region  Moderate depletion  Edema (RD Assessment)  Mild  Hair  Reviewed [Pt states hair is falling out.]  Eyes  Reviewed  Mouth  Reviewed  Skin  Reviewed  Nails  Reviewed       Diet Order:  Diet Heart Room service appropriate? Yes; Fluid consistency: Thin  EDUCATION NEEDS:   No education needs have been identified at this time  Skin:  Skin Assessment: Reviewed RN Assessment  Last BM:  01/22/17 small type 4  Height:   Ht Readings from Last 1 Encounters:  01/21/17 5' 4"  (1.626 m)    Weight:   Wt Readings from Last 1 Encounters:  01/22/17 152 lb (68.9 kg)    Ideal Body Weight:  54.5 kg  BMI:  Body mass index is 26.09 kg/m.  Estimated Nutritional Needs:   Kcal:  1300-1500 kcal/day  Protein:  80-95 grams/day  Fluid:  1.3-1.5 L/day    Gaynell Face, MS, RD, LDN Pager:  [phone] Weekend/After Hours: [phone]
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
## 2017-01-22 NOTE — Progress Notes (Signed)
[facility] NAME: Novice Vrba    MR#:  [id number]  DATE OF BIRTH:  [birth year]  SUBJECTIVE:   Shortness of breath has improved Sinus at bedside  REVIEW OF SYSTEMS:    Review of Systems  Constitutional: Negative for fever, chills weight loss HENT: Negative for ear pain, nosebleeds, congestion, facial swelling, rhinorrhea, neck pain, neck stiffness and ear discharge.   Respiratory: Negative for cough, shortness of breath (has improved), wheezing  Cardiovascular: Negative for chest pain, palpitations and leg swelling.  Gastrointestinal: Negative for heartburn, abdominal pain, vomiting, diarrhea or consitpation Genitourinary: Negative for dysuria, urgency, frequency, hematuria Musculoskeletal: Negative for back pain or joint pain Neurological: Negative for dizziness, seizures, syncope, focal weakness,  numbness and headaches.  Hematological: Does not bruise/bleed easily.  Psychiatric/Behavioral: Negative for hallucinations, confusion, dysphoric mood    Tolerating Diet: yes      DRUG ALLERGIES:   Allergies  Allergen Reactions  . Fenofibrate Other (See Comments)    Myalgias/gi upset   . Statins Other (See Comments)    Myalgias   . Sulfonamide Derivatives Nausea And Vomiting    REACTION: nausea    VITALS:  Blood pressure (!) 103/58, pulse 76, temperature 98 F (36.7 C), temperature source Axillary, resp. rate (!) 21, height 5\' 4"  (1.626 m), weight 68.9 kg (152 lb), SpO2 99 %.  PHYSICAL EXAMINATION:  Constitutional: Appears well-developed and well-nourished. No distress. HENT: Normocephalic. Marland Kitchen Oropharynx is clear and moist.  Eyes: Conjunctivae and EOM are normal. PERRLA, no scleral icterus.  Neck: Normal ROM. Neck supple. No JVD. No tracheal deviation. CVS: RRR, S1/S2 +, no murmurs, no gallops, no carotid bruit.  Pulmonary: Normal effort with decreased breath sounds at left base no rhonchi or rales Abdominal: Soft.  BS +,  no distension, tenderness, rebound or guarding.  Musculoskeletal: Normal range of motion. No edema and no tenderness.  Neuro: Alert. CN 2-12 grossly intact. No focal deficits. Skin: Skin is warm and dry. No rash noted. Psychiatric: Normal mood and affect.      LABORATORY PANEL:   CBC Recent Labs  Lab 01/22/17 0417  WBC 7.0  HGB 10.9*  HCT 35.2  PLT 156   ------------------------------------------------------------------------------------------------------------------  Chemistries  Recent Labs  Lab 01/22/17 0417  NA 138  K 3.3*  CL 101  CO2 27  GLUCOSE 94  BUN 37*  CREATININE 1.17*  CALCIUM 9.5   ------------------------------------------------------------------------------------------------------------------  Cardiac Enzymes Recent Labs  Lab 01/21/17 1643  TROPONINI <0.03   ------------------------------------------------------------------------------------------------------------------  RADIOLOGY:  Dg Chest 2 View  Result Date: 01/21/2017 CLINICAL DATA:  Shortness of breath and hypoxia. EXAM: CHEST  2 VIEW COMPARISON:  Chest x-ray dated January 17, 2017. FINDINGS: Stable cardiomegaly. Mild vascular congestion is unchanged. Interval increase in size of now large left and small right pleural effusions. Worsened bibasilar atelectasis. No acute osseous abnormality. IMPRESSION: Congestive heart failure with interval increase in size of large left and small right pleural effusions. Electronically Signed   By: Titus Dubin M.D.   On: 01/21/2017 16:18     ASSESSMENT AND PLAN:    84 year old female with history of chronic systolic heart failure ejection fraction 40% who presented with shortness of breath.  1. Acute on chronic hypoxic respiratory failure in the setting of acute CHF exacerbation Patient is on her baseline oxygen of 2 L  2. Acute on chronic soak heart failure ejection fraction of 40% with left-sided effusion (last hospitalization was  transudative): Continue IV diuresis Plan  for thoracentesis Monitor intake and output with daily weight Monitor BMP Transition to oral torsemide tomorrow  3. COPD without signs of exacerbation: Continue inhalers/nebulizer treatment  4. PAF: Continue Bisoprolol  And Eliquis (after thoracentesis) 5. Depression/Zaidi: Continue Lexapro and when necessary Klonopin  Plan for physical therapy and palliative care consultation today for discharge planning. Patient will likely go back to nursing home tomorrow. Management plans discussed with the patient and son and they are in agreement.  CODE STATUS: DO NOT RESUSCITATE  TOTAL TIME TAKING CARE OF THIS PATIENT: 30 minutes.     POSSIBLE D/C tomorrow, DEPENDING ON CLINICAL CONDITION.   Minh Jasper M.D on 01/22/2017 at 10:42 AM  Between 7am to 6pm - Pager - [phone] After 6pm go to [email or web address] - password [email or web address] [facility]  Office  [phone]  CC: Primary care physician; Mellody Dance, DO  Note: This dictation was prepared with Dragon dictation along with smaller phrase technology. Any transcriptional errors that result from this process are unintentional.

## 2017-01-22 NOTE — Consult Note (Signed)
Consultation Note Date: 01/22/2017   Patient Name: Wanda Davenport  DOB: 1923/08/03  MRN: [id number]  Age / Sex: 84 y.o., female  PCP: Mellody Dance, DO Referring Physician: Bettey Costa, MD  Reason for Consultation: Establishing goals of care  HPI/Patient Profile: Wanda Davenport  is a 84 y.o. female with a known history of anxiety, atypical chest pain, bronchitis, chronic systolic congestive heart failure, chronic respiratory failure requiring home oxygen, hypertension, atrial fibrillation, left bundle branch block- was admitted to hospital last week with CHF exacerbation and large left-sided pleural effusion.   Clinical Assessment and Goals of Care: Wanda Davenport is resting in bed. She is eating lunch, daughter at bedside. She lives alone and family members visit her daily. Upon attempting to discuss the idea of a family meeting, the daughter states she and her brother do not get along and were arguing just prior to this meeting.   She had a thoracentesis 1 week ago with reaccumulation requiring another thoracentesis upon this admission.  We discussed diagnosis, prognosis, GOC, EOL wishes disposition and options. The difference between an aggressive medical intervention path and a hospice comfort care path for this patient at this time was discussed.  Values and goals of care important to patient and family were attempted to be elicited.  Wanda Davenport states that she wants to go on as long as she can. She states she is fine with coming here for the thoracentesis procedures as needed for right now because she is going on a little longer. She states she feels like her daughter just wants her to "go on" because [place] and finds it difficult to be pulled between her and her home. Wanda Davenport states with the care of her son and his wife, she feels if her daughter is willing to work with  them, there is a way to work this all out and she is okay with nursing home placement. She confirms DNR status.   Discussed outpatient palliative to follow.     SUMMARY OF RECOMMENDATIONS    Outpatient palliative to follow.   Code Status/Advance Care Planning:  DNR  Palliative Prophylaxis:   Oral Care  Prognosis:   < 6 months CHF, thoracentesis 1 week prior to this hospitalization requiring an additional procedure.       Primary Diagnoses: Present on Admission: . Acute on chronic systolic CHF (congestive heart failure) ([place]) . Pleural effusion, left   I have reviewed the medical record, interviewed the patient and family, and examined the patient. The following aspects are pertinent.  Past Medical History:  Diagnosis Date  . Allergic rhinitis   . Anxiety   . Arthritis   . Atypical chest pain   . Bronchitis 01/14/2016  . Chronic diastolic CHF (congestive heart failure) (HCC)    Echo 06/2010 EF 60-65%, no RWMAs, grade 1 diastolic dysfunction, mild LAE, PASP 44mmHg.  Marland Kitchen Coronary artery disease    MI  . DIVERTICULOSIS, COLON   . HEMORRHOIDS, INTERNAL   .  Hepatic steatosis   . Hyperlipidemia   . HYPERTENSION   . LBBB (left bundle branch block)    a. present on ECG 06/2010  . Osteopenia   . Pre-diabetes    A1c 6.0   Social History   Socioeconomic History  . Marital status: Widowed    Spouse name: None  . Number of children: None  . Years of education: None  . Highest education level: None  Social Needs  . Financial resource strain: None  . Food insecurity - worry: None  . Food insecurity - inability: None  . Transportation needs - medical: None  . Transportation needs - non-medical: None  Occupational History  . Occupation: Retired  Tobacco Use  . Smoking status: Former Smoker    Packs/day: 0.25    Years: 20.00    Pack years: 5.00    Types: Cigarettes    Last attempt to quit: 01/02/1971    Years since quitting: 46.0  . Smokeless tobacco: Never Used    . Tobacco comment: smoked a few cigarettes/day x 20 yrs - quit @ age 30.  Substance and Sexual Activity  . Alcohol use: No  . Drug use: No  . Sexual activity: Not Currently  Other Topics Concern  . None  Social History Narrative   Lives in [place] by herself.  She has family near-by.  She tries to stay active around the house - does all of her own housework and still mows (rides) her yard.      Daily caffeine    Family History  Problem Relation Age of Onset  . Colon cancer Sister   . Melanoma Sister   . Hyperlipidemia Brother   . Hypertension Mother   . Lupus Mother        died 47 from surgical complications  . Alzheimer's disease Brother        unknown  . Kidney disease Father        died 25  . Hypertension Father   . Alcohol abuse Father    Scheduled Meds: . aspirin EC  81 mg Oral Daily  . bisoprolol  5 mg Oral Daily  . escitalopram  10 mg Oral BID  . fluticasone  2 spray Each Nare Daily  . [START ON 01/23/2017] furosemide  40 mg Intravenous Daily  . ipratropium-albuterol  3 mL Nebulization Q4H  . montelukast  10 mg Oral QHS  . sodium chloride flush  3 mL Intravenous Q12H   Continuous Infusions: PRN Meds:.albuterol, clonazepam, docusate sodium, nitroGLYCERIN, polyethylene glycol Medications Prior to Admission:  Prior to Admission medications   Medication Sig Start Date End Date Taking? Authorizing Provider  albuterol (PROVENTIL HFA;VENTOLIN HFA) 108 (90 BASE) MCG/ACT inhaler Inhale 2 puffs into the lungs every 6 (six) hours as needed. Wheezing or shortness of breath   Yes [provider]  apixaban (ELIQUIS) 5 MG TABS tablet Take 10 mg (2 tabs) twice a day till 05/22/16 and then from 05/23/16 take 1 tab (5 mg ) two times a day 05/17/16  Yes Fritzi Mandes, MD  aspirin 81 MG tablet Take by mouth daily.    Yes [provider]  bisoprolol (ZEBETA) 10 MG tablet Take 0.5 tablets (5 mg total) by mouth daily. 10/15/16 01/18/18 Yes Opalski, Neoma Laming, DO  clonazepam  (KLONOPIN) 0.125 MG disintegrating tablet Take 1 tablet (0.125 mg total) by mouth 2 (two) times daily as needed (anxiety). 01/17/17  Yes Sainani, Belia Heman, MD  escitalopram (LEXAPRO) 10 MG tablet Take 1 tablet (10  mg total) by mouth 2 (two) times daily. 10/15/16  Yes Opalski, Deborah, DO  fluticasone (FLONASE) 50 MCG/ACT nasal spray Place 2 sprays into the nose daily. Nasal congestion Rarely used    Yes [provider]  ipratropium-albuterol (DUONEB) 0.5-2.5 (3) MG/3ML SOLN Take 3 mLs by nebulization 2 (two) times daily.   Yes [provider]  montelukast (SINGULAIR) 10 MG tablet Take 1 tablet (10 mg total) by mouth at bedtime. 08/14/16  Yes Danford, Valetta Fuller D, NP  nitroGLYCERIN (NITROSTAT) 0.4 MG SL tablet Place under the tongue every 5 (five) minutes as needed for chest pain.    Yes [provider]  polyethylene glycol (MIRALAX / GLYCOLAX) packet Take 17 g by mouth daily as needed for mild constipation. 01/17/17  Yes Henreitta Leber, MD  torsemide (DEMADEX) 20 MG tablet Take 2 tablets by mouth 2 (two) times daily. 01/10/17  Yes [provider]   Allergies  Allergen Reactions  . Fenofibrate Other (See Comments)    Myalgias/gi upset   . Statins Other (See Comments)    Myalgias   . Sulfonamide Derivatives Nausea And Vomiting    REACTION: nausea   Review of Systems  Respiratory: Positive for shortness of breath.     Physical Exam  Constitutional: No distress.  Pulmonary/Chest: Effort normal.  Neurological: She is alert.  Oriented    Vital Signs: BP (!) 101/53 (BP Location: Right Arm)   Pulse 63   Temp 98 F (36.7 C) (Axillary)   Resp 19   Ht 5\' 4"  (1.626 m)   Wt 68.9 kg (152 lb)   SpO2 100%   BMI 26.09 kg/m  Pain Assessment: No/denies pain   Pain Score: Asleep   SpO2: SpO2: 100 % O2 Device:SpO2: 100 % O2 Flow Rate: .O2 Flow Rate (L/min): 2 L/min  IO: Intake/output summary:   Intake/Output Summary (Last 24 hours) at 01/22/2017 1233 Last  data filed at 01/22/2017 6812 Gross per 24 hour  Intake -  Output 800 ml  Net -800 ml    LBM: Last BM Date: 01/22/17 Baseline Weight: Weight: 64.9 kg (143 lb) Most recent weight: Weight: 68.9 kg (152 lb)     Palliative Assessment/Data: 50%     Time In: 12:00 Time Out: 12:50 Time Total: 50 min Greater than 50%  of this time was spent counseling and coordinating care related to the above assessment and plan.  Signed by: Asencion Gowda, NP   Please contact Palliative Medicine Team phone at [phone] for questions and concerns.  For individual provider: See Shea Evans

## 2017-01-22 NOTE — Progress Notes (Signed)
ANTICOAGULATION CONSULT NOTE - Initial Consult  Pharmacy Consult for Apixaban dosing Indication: atrial fibrillation  Allergies  Allergen Reactions  . Fenofibrate Other (See Comments)    Myalgias/gi upset   . Statins Other (See Comments)    Myalgias   . Sulfonamide Derivatives Nausea And Vomiting    REACTION: nausea    Patient Measurements: Height: 5\' 4"  (162.6 cm) Weight: 152 lb (68.9 kg) IBW/kg (Calculated) : 54.7 Heparin Dosing Weight:   Vital Signs: Temp: 98 F (36.7 C) (01/22 1200) Temp Source: Axillary (01/22 1200) BP: 101/53 (01/22 1232) Pulse Rate: 63 (01/22 1200)  Labs: Recent Labs    01/21/17 1643 01/22/17 0417  HGB 11.5* 10.9*  HCT 37.2 35.2  PLT 169 156  CREATININE 1.38* 1.17*  TROPONINI <0.03  --     Estimated Creatinine Clearance: 29.3 mL/min (A) (by C-G formula based on SCr of 1.17 mg/dL (H)).   Medical History: Past Medical History:  Diagnosis Date  . Allergic rhinitis   . Anxiety   . Arthritis   . Atypical chest pain   . Bronchitis 01/14/2016  . Chronic diastolic CHF (congestive heart failure) (HCC)    Echo 06/2010 EF 60-65%, no RWMAs, grade 1 diastolic dysfunction, mild LAE, PASP 2mmHg.  Marland Kitchen Coronary artery disease    MI  . DIVERTICULOSIS, COLON   . HEMORRHOIDS, INTERNAL   . Hepatic steatosis   . Hyperlipidemia   . HYPERTENSION   . LBBB (left bundle branch block)    a. present on ECG 06/2010  . Osteopenia   . Pre-diabetes    A1c 6.0    Assessment: Patient is a 84yo female admitted for pleural effusion. Patient had thoracentesis this morning, Pharmacy consulted for apixaban dosing.   Plan:  As patient had thoracentesis today will need to hold off on resuming therapy until tomorrow morning, at that time will resume home dose of 5mg  bid,  Paulina Fusi, PharmD, BCPS 01/22/2017 1:08 PM

## 2017-01-22 NOTE — Progress Notes (Signed)
PT Cancellation Note  Patient Details Name: Wanda Davenport MRN: [id number] DOB: Jul 22, 1923   Cancelled Treatment:     Pt refused, stating that she doesn't want to do PT in her hospital gown and that she is too fatigued.  When asked if she will be willing to try tomorrow, pt replied that she would try.  Pt is A&O to name and DOB but appears to believe she is still at [facility] when talking about therapy and room location.  PT will try again tomorrow.   Roxanne Gates, Roxanne Gates, PT, DPT  01/22/2017, 2:56 PM

## 2017-01-22 NOTE — Progress Notes (Signed)
MD notified of patients blood pressure readings. 96/51; 101/53; 98/56. MD gave verbal orders or discontinue lasix for tomorrow. Patient asymptomatic. Will continue to monitor patient.

## 2017-01-23 ENCOUNTER — Ambulatory Visit: Payer: Self-pay | Admitting: *Deleted

## 2017-01-23 LAB — CYTOLOGY - NON PAP

## 2017-01-23 LAB — BASIC METABOLIC PANEL
ANION GAP: 9 (ref 5–15)
BUN: 42 mg/dL — ABNORMAL HIGH (ref 6–20)
CALCIUM: 9.8 mg/dL (ref 8.9–10.3)
CO2: 28 mmol/L (ref 22–32)
Chloride: 101 mmol/L (ref 101–111)
Creatinine, Ser: 1.24 mg/dL — ABNORMAL HIGH (ref 0.44–1.00)
GFR, EST AFRICAN AMERICAN: 42 mL/min — AB (ref >60)
GFR, EST NON AFRICAN AMERICAN: 37 mL/min — AB (ref >60)
GLUCOSE: 109 mg/dL — AB (ref 65–99)
POTASSIUM: 3.5 mmol/L (ref 3.5–5.1)
Sodium: 138 mmol/L (ref 135–145)

## 2017-01-23 LAB — MISC LABCORP TEST (SEND OUT): Labcorp test code: 19588

## 2017-01-23 MED ORDER — POLYVINYL ALCOHOL 1.4 % OP SOLN
1.0000 [drp] | OPHTHALMIC | Status: DC | PRN
  Administered 2017-01-23: 1 [drp] via OPHTHALMIC
  Filled 2017-01-23 (×2): qty 15

## 2017-01-23 MED ORDER — IPRATROPIUM-ALBUTEROL 0.5-2.5 (3) MG/3ML IN SOLN
RESPIRATORY_TRACT | Status: AC
  Filled 2017-01-23: qty 3

## 2017-01-23 NOTE — NC FL2 (Signed)
[place] LEVEL OF CARE SCREENING TOOL     IDENTIFICATION  Patient Name: Wanda Davenport Birthdate: [birth year] Sex: female Admission Date (Current Location): 01/21/2017  [place] and [place] Number:  Engineering geologist and Address:  [facility], [address] [place], [place] [address]      Provider Number: [id number]  Attending Physician Name and Address:  Vaughan Basta, *  Relative Name and Phone Number:  Mekenzie, Modeste [phone]  [phone] and Roof,Brenda Daughter [phone] or SHYLIE, POLO  [phone]     Current Level of Care: Hospital Recommended Level of Care: [place] Prior Approval Number:    Date Approved/Denied:   PASRR Number: [id number]  Discharge Plan: SNF    Current Diagnoses: Patient Active Problem List   Diagnosis Date Noted  . Malnutrition of moderate degree 01/22/2017  . Acute on chronic respiratory failure with hypoxia ([place]) 01/21/2017  . Acute on chronic systolic CHF (congestive heart failure) ([place]) 01/12/2017  . Bilateral leg edema 09/05/2016  . Sinusitis, acute maxillary 07/12/2016  . Cough 07/12/2016  . Shortness of breath 07/12/2016  . Pleural effusion, left 07/12/2016  . Chronic systolic heart failure ([place]) 06/15/2016  . Acute MI anterior wall subsequent episode care ([place]) 06/04/2016  . PE (pulmonary thromboembolism) ([place]) 05/19/2016  . Major depression in remission ([place]) 05/07/2016  . Pneumonia, community acquired 05/07/2016  . Unstable angina ([place])   . Coronary artery disease of native artery of native heart with stable angina pectoris ([place]) 05/01/2016  . STEMI (ST elevation myocardial infarction) ([place]) 04/26/2016  . Knee pain, bilateral 04/10/2016  . Medication monitoring encounter 01/26/2016  . Intolerance of drug- statins 01/25/2016  . Counseling regarding end of life decision making 01/25/2016  . Hypertriglyceridemia 12/13/2015  . Low serum HDL  12/13/2015  . B12 deficiency 12/13/2015  . Claudication of left lower extremity ([place]) 12/13/2015  . Vitamin D deficiency 11/06/2015  . Adjustment disorder with mixed anxiety and depressed mood 11/06/2015  . Basal cell carcinoma 10/31/2015  . Environmental and seasonal allergies 10/31/2015  . Reactive airway disease- only spring and fall due to allergies 10/31/2015  . GAD (generalized anxiety disorder) 10/13/2015  . Atypical chest pain 04/16/2011  . Hyperlipidemia   . Pre-diabetes   . chronic LBBB (left bundle branch block)- since 2012   . Generalized OA   . Allergic rhinitis   . DIVERTICULOSIS, COLON 12/03/2007  . HTN (hypertension) 04/26/2007    Orientation RESPIRATION BLADDER Height & Weight     Self, Time, Situation, Place  O2(2L) Continent Weight: 152 lb 3.2 oz (69 kg) Height:  5\' 4"  (162.6 cm)  BEHAVIORAL SYMPTOMS/MOOD NEUROLOGICAL BOWEL NUTRITION STATUS      Continent Diet(Cardiac)  AMBULATORY STATUS COMMUNICATION OF NEEDS Skin   Limited Assist Verbally Normal                       Personal Care Assistance Level of Assistance  Bathing, Feeding, Dressing Bathing Assistance: Limited assistance Feeding assistance: Independent Dressing Assistance: Limited assistance     Functional Limitations Info  Hearing, Speech, Sight Sight Info: Adequate Hearing Info: Adequate Speech Info: Adequate    SPECIAL CARE FACTORS FREQUENCY  PT (By licensed PT), OT (By licensed OT)     PT Frequency: 5x a week OT Frequency: 5x a week            Contractures Contractures Info: Not present    Additional Factors Info  Code Status, Allergies, Psychotropic Code  Status Info: DNR Allergies Info: FENOFIBRATE, STATINS, SULFONAMIDE DERIVATIVES  Psychotropic Info: escitalopram (LEXAPRO) tablet 10 mg          Current Medications (01/23/2017):  This is the current hospital active medication list Current Facility-Administered Medications  Medication Dose Route Frequency Provider  Last Rate Last Dose  . albuterol (PROVENTIL) (2.5 MG/3ML) 0.083% nebulizer solution 3 mL  3 mL Inhalation Q4H PRN Mody, Sital, MD      . apixaban (ELIQUIS) tablet 5 mg  5 mg Oral BID Vira Blanco, RPH   5 mg at 01/23/17 1057  . aspirin EC tablet 81 mg  81 mg Oral Daily Vaughan Basta, MD   81 mg at 01/23/17 1057  . bisoprolol (ZEBETA) tablet 5 mg  5 mg Oral Daily Vaughan Basta, MD   5 mg at 01/23/17 1057  . clonazepam (KLONOPIN) disintegrating tablet 0.125 mg  0.125 mg Oral BID PRN Vaughan Basta, MD      . docusate sodium (COLACE) capsule 100 mg  100 mg Oral BID PRN Vaughan Basta, MD      . escitalopram (LEXAPRO) tablet 10 mg  10 mg Oral BID Vaughan Basta, MD   10 mg at 01/23/17 1057  . feeding supplement (ENSURE ENLIVE) (ENSURE ENLIVE) liquid 237 mL  237 mL Oral TID WC Mody, Sital, MD   237 mL at 01/23/17 1056  . fluticasone (FLONASE) 50 MCG/ACT nasal spray 2 spray  2 spray Each Nare Daily Vaughan Basta, MD   2 spray at 01/23/17 1057  . ipratropium-albuterol (DUONEB) 0.5-2.5 (3) MG/3ML nebulizer solution 3 mL  3 mL Nebulization QID Bettey Costa, MD   3 mL at 01/23/17 1103  . montelukast (SINGULAIR) tablet 10 mg  10 mg Oral QHS Vaughan Basta, MD   10 mg at 01/22/17 2119  . nitroGLYCERIN (NITROSTAT) SL tablet 0.4 mg  0.4 mg Sublingual Q5 min PRN Vaughan Basta, MD      . polyethylene glycol (MIRALAX / GLYCOLAX) packet 17 g  17 g Oral Daily PRN Vaughan Basta, MD      . polyvinyl alcohol (LIQUIFILM TEARS) 1.4 % ophthalmic solution 1 drop  1 drop Both Eyes PRN Vaughan Basta, MD      . sodium chloride flush (NS) 0.9 % injection 3 mL  3 mL Intravenous Q12H Bettey Costa, MD   3 mL at 01/23/17 1058     Discharge Medications: Please see discharge summary for a list of discharge medications.  Relevant Imaging Results:  Relevant Lab Results:   Additional Information SS# [id number]  Anell Barr

## 2017-01-23 NOTE — Clinical Social Work Note (Signed)
Clinical Social Work Assessment  Patient Details  Name: Wanda Davenport MRN: [id number] Date of Birth: 11-Oct-1923  Date of referral:  01/23/17               Reason for consult:  Facility Placement                Permission sought to share information with:  Facility Sport and exercise psychologist, Family Supports Permission granted to share information::  Yes, Verbal Permission Granted  Name::     Tariah, [place] [phone] or Roof,Brenda Daughter [phone] or NAKYIAH, [place] [phone] taken 6 days ago   Agency::  SNF admissions  Relationship::     Contact Information:     Housing/Transportation Living arrangements for the past 2 months:  [place], [place] of Information:  Patient, Adult Children, Medical Team Patient Interpreter Needed:  None Criminal Activity/Legal Involvement Pertinent to Current Situation/Hospitalization:  No - Comment as needed Significant Relationships:  Adult Children Lives with:  Self Do you feel safe going back to the place where you live?  Yes Need for family participation in patient care:  No (Coment)  Care giving concerns:  Patient's family in agreement about returning back to SNF.   Social Worker assessment / plan:  Patient is a 84 year old female who is alert and oriented x4.  Patient is currently a short term rehab at [facility].  Patient's family did not express any concerns about returning back to SNF.  CSW informed patient's family that insurance authorization will have to be started again for her to return back to SNF.  Patient's family expressed understanding and did not have any other questions.  CSW was given permission to send updated clinicals to SNF.  Employment status:  Retired Surveyor, [employer] Care PT Recommendations:  Home with [place] / Referral to community resources:  [place]  Patient/Family's Response to care:  Patient and family  agreeable to returning back to SNF to continue with therapy. Patient/Family's Understanding of and Emotional Response to Diagnosis, Current Treatment, and Prognosis:  Patient is aware of current treatment plan and prognosis.  Emotional Assessment Appearance:  Appears stated age Attitude/Demeanor/Rapport:    Affect (typically observed):  Calm, Quiet Orientation:  Oriented to Self, Oriented to Place, Oriented to  Time, Oriented to Situation Alcohol / Substance use:  Not Applicable Psych involvement (Current and /or in the community):  No (Comment)  Discharge Needs  Concerns to be addressed:  Care Coordination Readmission within the last 30 days:  No(09-17-17 to [facility]) Current discharge risk:  Lack of support system, Lives alone but currently receiving short term rehab at [facility]. Barriers to Discharge:  Continued Medical Work up   Anell Barr 01/23/2017, 2:44 PM

## 2017-01-23 NOTE — Clinical Social Work Note (Signed)
CSW spoke to [facility] they have started insurance authorization again for patient to return back to SNF.  CSW sent updated clinical information to SNF.  Jones Broom. [place], MSW, [place]  01/23/2017 2:54 PM

## 2017-01-23 NOTE — Progress Notes (Signed)
Pt is complaining about eye irritation. Liquifilm was ordered. Just awaiting for pharmacy to acknowledge and send. Will continue to monitor.

## 2017-01-23 NOTE — Plan of Care (Signed)
  Progressing Clinical Measurements: Ability to maintain clinical measurements within normal limits will improve 01/23/2017 1137 - Progressing by Liliane Channel, RN Respiratory complications will improve 01/23/2017 1137 - Progressing by Liliane Channel, RN Activity: Risk for activity intolerance will decrease 01/23/2017 1137 - Progressing by Liliane Channel, RN Pain Managment: General experience of comfort will improve 01/23/2017 1137 - Progressing by Liliane Channel, RN Safety: Ability to remain free from injury will improve 01/23/2017 1137 - Progressing by Liliane Channel, RN

## 2017-01-23 NOTE — Progress Notes (Signed)
[facility] NAME: Wanda Davenport    MR#:  [id number]  DATE OF BIRTH:  [birth year]  SUBJECTIVE:   Shortness of breath has improved Son at bedside, 600 mL of fluid removed in the thoracentesis.  REVIEW OF SYSTEMS:    Review of Systems  Constitutional: Negative for fever, chills weight loss HENT: Negative for ear pain, nosebleeds, congestion, facial swelling, rhinorrhea, neck pain, neck stiffness and ear discharge.   Respiratory: Negative for cough, shortness of breath (has improved), wheezing  Cardiovascular: Negative for chest pain, palpitations and leg swelling.  Gastrointestinal: Negative for heartburn, abdominal pain, vomiting, diarrhea or consitpation Genitourinary: Negative for dysuria, urgency, frequency, hematuria Musculoskeletal: Negative for back pain or joint pain Neurological: Negative for dizziness, seizures, syncope, focal weakness,  numbness and headaches.  Hematological: Does not bruise/bleed easily.  Psychiatric/Behavioral: Negative for hallucinations, confusion, dysphoric mood    Tolerating Diet: yes      DRUG ALLERGIES:   Allergies  Allergen Reactions  . Fenofibrate Other (See Comments)    Myalgias/gi upset   . Statins Other (See Comments)    Myalgias   . Sulfonamide Derivatives Nausea And Vomiting    REACTION: nausea    VITALS:  Blood pressure 111/71, pulse 63, temperature (!) 97.4 F (36.3 C), temperature source Oral, resp. rate 18, height 5' 4"  (1.626 m), weight 69 kg (152 lb 3.2 oz), SpO2 92 %.  PHYSICAL EXAMINATION:  Constitutional: Appears well-developed and well-nourished. No distress. HENT: Normocephalic. Marland Kitchen Oropharynx is clear and moist.  Eyes: Conjunctivae and EOM are normal. PERRLA, no scleral icterus.  Neck: Normal ROM. Neck supple. No JVD. No tracheal deviation. CVS: RRR, S1/S2 +, no murmurs, no gallops, no carotid bruit.  Pulmonary: Normal effort with decreased breath sounds at left  base no rhonchi or rales Abdominal: Soft. BS +,  no distension, tenderness, rebound or guarding.  Musculoskeletal: Normal range of motion. No edema and no tenderness.  Neuro: Alert. CN 2-12 grossly intact. No focal deficits. Generalized weakness. Skin: Skin is warm and dry. No rash noted. Psychiatric: Normal mood and affect.    LABORATORY PANEL:   CBC Recent Labs  Lab 01/22/17 0417  WBC 7.0  HGB 10.9*  HCT 35.2  PLT 156   ------------------------------------------------------------------------------------------------------------------  Chemistries  Recent Labs  Lab 01/23/17 0319  NA 138  K 3.5  CL 101  CO2 28  GLUCOSE 109*  BUN 42*  CREATININE 1.24*  CALCIUM 9.8   ------------------------------------------------------------------------------------------------------------------  Cardiac Enzymes Recent Labs  Lab 01/21/17 1643  TROPONINI <0.03   ------------------------------------------------------------------------------------------------------------------  RADIOLOGY:  Dg Chest 1 View  Result Date: 01/22/2017 CLINICAL DATA:  Post left thoracentesis EXAM: CHEST 1 VIEW COMPARISON:  01/21/2017 FINDINGS: Large left pleural effusion has decreased following thoracentesis. No pneumothorax. Small to moderate right effusion. Bilateral lower lobe atelectasis or infiltrates. Mild cardiomegaly. IMPRESSION: Large left effusion and small to moderate right effusion. Left effusion decreased following thoracentesis. No pneumothorax. Bibasilar atelectasis or infiltrates. Electronically Signed   By: Rolm Baptise M.D.   On: 01/22/2017 11:37   Dg Chest 2 View  Result Date: 01/21/2017 CLINICAL DATA:  Shortness of breath and hypoxia. EXAM: CHEST  2 VIEW COMPARISON:  Chest x-ray dated January 17, 2017. FINDINGS: Stable cardiomegaly. Mild vascular congestion is unchanged. Interval increase in size of now large left and small right pleural effusions. Worsened bibasilar atelectasis. No acute  osseous abnormality. IMPRESSION: Congestive heart failure with interval increase in size of large left and small right  pleural effusions. Electronically Signed   By: Titus Dubin M.D.   On: 01/21/2017 16:18   US Thoracentesis Asp Pleural Space W/img Guide  Result Date: 01/22/2017 INDICATION: Patient with history of CHF, bilateral pleural effusions. Request is made for diagnostic and therapeutic left thoracentesis. EXAM: ULTRASOUND GUIDED DIAGNOSTIC AND THERAPEUTIC LEFT THORACENTESIS MEDICATIONS: 15 mL 1% lidocaine COMPLICATIONS: None immediate. PROCEDURE: An ultrasound guided thoracentesis was thoroughly discussed with the patient and questions answered. The benefits, risks, alternatives and complications were also discussed. The patient understands and wishes to proceed with the procedure. Written consent was obtained. Ultrasound was performed to localize and mark an adequate pocket of fluid in the left chest. The area was then prepped and draped in the normal sterile fashion. 1% Lidocaine was used for local anesthesia. Under ultrasound guidance, an attempt for introduction into pleural fluid with a Safe-T-Centesis catheter was made. Catheter met significant resistance, thought to be related to an extensive amount of scar tissue and skin thickness. With ultrasound guidance, a second scan site was prepped and a 7-cm Yueh catheter was introduced. Thoracentesis was performed. The catheter was removed and a dressing applied. FINDINGS: A total of approximately 600 mL of clear, yellow fluid was removed. Samples were sent to the laboratory as requested by the clinical team. IMPRESSION: Successful ultrasound guided diagnostic and therapeutic left thoracentesis yielding 600 mL of pleural fluid. Read by: Brynda Greathouse PA-C Electronically Signed   By: Jerilynn Mages.  Shick M.D.   On: 01/22/2017 12:04     ASSESSMENT AND PLAN:    84 year old female with history of chronic systolic heart failure ejection fraction 40% who  presented with shortness of breath.  1. Acute on chronic hypoxic respiratory failure in the setting of acute CHF exacerbation Patient is on her baseline oxygen of 2 L  2. Acute on chronic systolic heart failure ejection fraction of 40% with left-sided effusion (last hospitalization was transudative): Continue IV diuresis Plan for thoracentesis- 600 mL of fluid removed Monitor intake and output with daily weight Monitor BMP Transition to oral torsemide on discharge.  3. COPD without signs of exacerbation: Continue inhalers/nebulizer treatment  4. PAF: Continue Bisoprolol  And Eliquis (after thoracentesis) 5. Depression/Zaidi: Continue Lexapro and when necessary Klonopin  6. Coronary artery disease status post stent   Continue aspirin and Eliquis, allergy to statin.  Plan for physical therapy and palliative care consultation today for discharge planning. Patient will likely go back to nursing home tomorrow. Management plans discussed with the patient and son and they are in agreement.  CODE STATUS: DO NOT RESUSCITATE  TOTAL TIME TAKING CARE OF THIS PATIENT: 30 minutes.   Due to her recurrent admissions, palliative care consult was called in and the patient and her family had decided to continue the current level of care for now. We will advise palliative care nurse to follow after discharge.  POSSIBLE D/C tomorrow, DEPENDING ON CLINICAL CONDITION.   Vaughan Basta M.D on 01/23/2017 at 2:08 PM  Between 7am to 6pm - Pager - [phone] After 6pm go to [email or web address] - password [email or web address] [facility]  Office  [phone]  CC: Primary care physician; Mellody Dance, DO  Note: This dictation was prepared with Dragon dictation along with smaller phrase technology. Any transcriptional errors that result from this process are unintentional.

## 2017-01-23 NOTE — Evaluation (Signed)
Physical Therapy Evaluation Patient Details Name: Wanda Davenport MRN: [id number] DOB: 11/14/1923 Today's Date: 01/23/2017   History of Present Illness  Pt admitted for acute on chronic CHF. Pt with complaints of SOB and L side pleural effusion and is s/p thoracentesis yesterday. History includes HTN, CAD, and anxiety and is on 2L of O2 chronically.   Clinical Impression  Pt is a 84 year old female who recently DC'd from hospital to a SNF.  She presents as disoriented to time and place but oriented to name and DOB.  Pt is able to perform bed mobility with use of bedrail and increased time.  Pt required min A for initiation of STS and VC's for proper use of RW.  Pt ambulated 30 ft in room with RW and frequent VC's for safe use of RW.  Pt desat to 87% during ambulation and recovered within 2 min of rest.  Pt will continue to benefit from skilled PT with focus on strength, tolerance to activity, balance and proper use of AD.    Follow Up Recommendations SNF    Equipment Recommendations       Recommendations for Other Services       Precautions / Restrictions Precautions Precautions: Fall Restrictions Weight Bearing Restrictions: No      Mobility  Bed Mobility Overal bed mobility: Modified Independent       Supine to sit: Modified independent (Device/Increase time)     General bed mobility comments: Pt able to perform supine to sit with use of bed rail  Transfers Overall transfer level: Needs assistance Equipment used: Rolling walker (2 wheeled) Transfers: Sit to/from Stand Sit to Stand: Min assist         General transfer comment: Pt requires Min A to initiate STS and VC's for hand placement and upright posture.  Ambulation/Gait Ambulation/Gait assistance: Min guard Ambulation Distance (Feet): 30 Feet Assistive device: Rolling walker (2 wheeled)     Gait velocity interpretation: Below normal speed for age/gender General Gait Details: Pt presents with low foot  clearance and flexed posture, requires frequent VC's for placement of RW in proximity to body.  Pt desat to 87% following 30 ft of ambulation and recovered to 92% following 2 min of rest.  Stairs            Wheelchair Mobility    Modified Rankin (Stroke Patients Only)       Balance Overall balance assessment: Needs assistance;History of Falls Sitting-balance support: Bilateral upper extremity supported       Standing balance support: Bilateral upper extremity supported                                 Pertinent Vitals/Pain Pain Assessment: No/denies pain    Home Living Family/patient expects to be discharged to:: Skilled nursing facility Living Arrangements: Alone Available Help at Discharge: Family;Available PRN/intermittently Type of Home: [place] Access: Stairs to enter Entrance Stairs-Rails: None Entrance Stairs-Number of Steps: 2 Home Layout: One level Home Equipment: Walker - 2 wheels;Cane - quad      Prior Function Level of Independence: Needs assistance   Gait / Transfers Assistance Needed: Pt required use of RW for ambulation.  ADL's / Homemaking Assistance Needed: Pt receives assistance with bathing and dressing.        Hand Dominance        Extremity/Trunk Assessment   Upper Extremity Assessment Upper Extremity Assessment: Generalized weakness  Lower Extremity Assessment Lower Extremity Assessment: Overall WFL for tasks assessed    Cervical / Trunk Assessment Cervical / Trunk Assessment: Kyphotic  Communication      Cognition Arousal/Alertness: Awake/alert Behavior During Therapy: WFL for tasks assessed/performed Overall Cognitive Status: History of cognitive impairments - at baseline                                        General Comments      Exercises     Assessment/Plan    PT Assessment Patient needs continued PT services  PT Problem List Decreased  strength;Pain;Decreased balance;Decreased activity tolerance;Decreased mobility;Decreased knowledge of use of DME;Decreased cognition       PT Treatment Interventions DME instruction;Gait training;Functional mobility training;Therapeutic activities;Therapeutic exercise;Stair training;Balance training;Cognitive remediation;Patient/family education    PT Goals (Current goals can be found in the Care Plan section)  Acute Rehab PT Goals Patient Stated Goal: To return to SNF and continue therapy. PT Goal Formulation: With patient Time For Goal Achievement: 02/06/17 Potential to Achieve Goals: Good    Frequency Min 2X/week   Barriers to discharge        Co-evaluation               AM-PAC PT "6 Clicks" Daily Activity  Outcome Measure Difficulty turning over in bed (including adjusting bedclothes, sheets and blankets)?: A Little Difficulty moving from lying on back to sitting on the side of the bed? : A Little Difficulty sitting down on and standing up from a chair with arms (e.g., wheelchair, bedside commode, etc,.)?: A Little Help needed moving to and from a bed to chair (including a wheelchair)?: A Little Help needed walking in hospital room?: A Little Help needed climbing 3-5 steps with a railing? : A Little 6 Click Score: 18    End of Session Equipment Utilized During Treatment: Gait belt;Oxygen Activity Tolerance: Patient limited by fatigue(O2 desat to 87%) Patient left: in chair;with call bell/phone within reach;with chair alarm set   PT Visit Diagnosis: Unsteadiness on feet (R26.81);Muscle weakness (generalized) (M62.81);History of falling (Z91.81)    Time: 5093-2671 PT Time Calculation (min) (ACUTE ONLY): 20 min   Charges:   PT Evaluation $PT Eval Low Complexity: 1 Low PT Treatments $Therapeutic Activity: 8-22 mins   PT G Codes:   PT G-Codes **NOT FOR INPATIENT CLASS** Functional Assessment Tool Used: AM-PAC 6 Clicks Basic Mobility Functional Limitation:  Mobility: Walking and moving around Mobility: Walking and Moving Around Current Status (I4580): At least 40 percent but less than 60 percent impaired, limited or restricted Mobility: Walking and Moving Around Goal Status [phone]): At least 1 percent but less than 20 percent impaired, limited or restricted    Roxanne Gates, PT, DPT   Roxanne Gates 01/23/2017, 12:37 PM

## 2017-01-24 MED ORDER — ENSURE ENLIVE PO LIQD: 237.0000 mL | Freq: Three times a day (TID) | ORAL | 12 refills | Status: AC

## 2017-01-24 MED ORDER — APIXABAN 5 MG PO TABS: 5.0000 mg | ORAL_TABLET | Freq: Two times a day (BID) | ORAL | 0 refills | Status: AC

## 2017-01-24 MED ORDER — IPRATROPIUM-ALBUTEROL 0.5-2.5 (3) MG/3ML IN SOLN
3.0000 mL | Freq: Two times a day (BID) | RESPIRATORY_TRACT | Status: DC
  Administered 2017-01-25: 3 mL via RESPIRATORY_TRACT
  Filled 2017-01-24: qty 3

## 2017-01-24 MED ORDER — DOCUSATE SODIUM 100 MG PO CAPS: 100.0000 mg | ORAL_CAPSULE | Freq: Two times a day (BID) | ORAL | 0 refills | Status: AC | PRN

## 2017-01-24 NOTE — Discharge Summary (Signed)
[facility] [place] NAME: Wanda Davenport    MR#:  [id number]  DATE OF BIRTH:  04-23-1923  DATE OF ADMISSION:  01/21/2017 ADMITTING PHYSICIAN: Vaughan Basta, MD  DATE OF DISCHARGE: 01/24/2017  PRIMARY CARE PHYSICIAN: Mellody Dance, DO    ADMISSION DIAGNOSIS:  Shortness of breath [R06.02] Pleural effusion [J90] Hypoxia [R09.02] Pleural effusion, left [J90] Atrial flutter, unspecified type ([place]) [I48.92]  DISCHARGE DIAGNOSIS:  Active Problems:   Pleural effusion, left   Acute on chronic systolic CHF (congestive heart failure) (HCC)   Acute on chronic respiratory failure with hypoxia (HCC)   Malnutrition of moderate degree  SECONDARY DIAGNOSIS:   Past Medical History:  Diagnosis Date  . Allergic rhinitis   . Anxiety   . Arthritis   . Atypical chest pain   . Bronchitis 01/14/2016  . Chronic diastolic CHF (congestive heart failure) (HCC)    Echo 06/2010 EF 60-65%, no RWMAs, grade 1 diastolic dysfunction, mild LAE, PASP 84mmHg.  Marland Kitchen Coronary artery disease    MI  . DIVERTICULOSIS, COLON   . HEMORRHOIDS, INTERNAL   . Hepatic steatosis   . Hyperlipidemia   . HYPERTENSION   . LBBB (left bundle branch block)    a. present on ECG 06/2010  . Osteopenia   . Pre-diabetes    A1c 6.0    HOSPITAL COURSE:   84 year old female with history of chronic systolic heart failure ejection fraction 40% who presented with shortness of breath.  1. Acute on chronic hypoxic respiratory failure in the setting of acute CHF exacerbation Patient is on her baseline oxygen of 2 L  2. Acute on chronic systolic heart failure ejection fraction of 40% with left-sided effusion (last hospitalization was transudative): Continue IV diuresis Plan for thoracentesis- 600 mL of fluid removed Monitor intake and output with daily weight Monitor BMP Transition to oral torsemide on discharge.  pt felt much better after this.  3. COPD without  signs of exacerbation: Continue inhalers/nebulizer treatment  4. PAF: Continue Bisoprolol  And Eliquis (after thoracentesis) 5. Depression/Zaidi: Continue Lexapro and when necessary Klonopin  6. Coronary artery disease status post stent   Continue aspirin and Eliquis, allergy to statin.    DISCHARGE CONDITIONS:   Stable.  CONSULTS OBTAINED:  Treatment Team:  Teodoro Spray, MD  DRUG ALLERGIES:   Allergies  Allergen Reactions  . Fenofibrate Other (See Comments)    Myalgias/gi upset   . Statins Other (See Comments)    Myalgias   . Sulfonamide Derivatives Nausea And Vomiting    REACTION: nausea    DISCHARGE MEDICATIONS:   Allergies as of 01/24/2017      Reactions   Fenofibrate Other (See Comments)   Myalgias/gi upset   Statins Other (See Comments)   Myalgias   Sulfonamide Derivatives Nausea And Vomiting   REACTION: nausea      Medication List    TAKE these medications   albuterol 108 (90 Base) MCG/ACT inhaler Commonly known as:  PROVENTIL HFA;VENTOLIN HFA Inhale 2 puffs into the lungs every 6 (six) hours as needed. Wheezing or shortness of breath   apixaban 5 MG Tabs tablet Commonly known as:  ELIQUIS Take 1 tablet (5 mg total) by mouth 2 (two) times daily. What changed:    how much to take  how to take this  when to take this  additional instructions   aspirin 81 MG tablet Take by mouth daily.   bisoprolol 10 MG tablet Commonly known as:  ZEBETA Take 0.5 tablets (5 mg total) by mouth daily.   clonazepam 0.125 MG disintegrating tablet Commonly known as:  KLONOPIN Take 1 tablet (0.125 mg total) by mouth 2 (two) times daily as needed (anxiety).   docusate sodium 100 MG capsule Commonly known as:  COLACE Take 1 capsule (100 mg total) by mouth 2 (two) times daily as needed for mild constipation.   escitalopram 10 MG tablet Commonly known as:  LEXAPRO Take 1 tablet (10 mg total) by mouth 2 (two) times daily.   feeding supplement (ENSURE  ENLIVE) Liqd Take 237 mLs by mouth 3 (three) times daily with meals.   fluticasone 50 MCG/ACT nasal spray Commonly known as:  FLONASE Place 2 sprays into the nose daily. Nasal congestion Rarely used   ipratropium-albuterol 0.5-2.5 (3) MG/3ML Soln Commonly known as:  DUONEB Take 3 mLs by nebulization 2 (two) times daily.   montelukast 10 MG tablet Commonly known as:  SINGULAIR Take 1 tablet (10 mg total) by mouth at bedtime.   nitroGLYCERIN 0.4 MG SL tablet Commonly known as:  NITROSTAT Place under the tongue every 5 (five) minutes as needed for chest pain.   polyethylene glycol packet Commonly known as:  MIRALAX / GLYCOLAX Take 17 g by mouth daily as needed for mild constipation.   torsemide 20 MG tablet Commonly known as:  DEMADEX Take 2 tablets by mouth 2 (two) times daily.        DISCHARGE INSTRUCTIONS:    Follow with cardio clinic. Palliative care nurse to follow at SNF>  If you experience worsening of your admission symptoms, develop shortness of breath, life threatening emergency, suicidal or homicidal thoughts you must seek medical attention immediately by calling 911 or calling your MD immediately  if symptoms less severe.  You Must read complete instructions/literature along with all the possible adverse reactions/side effects for all the Medicines you take and that have been prescribed to you. Take any new Medicines after you have completely understood and accept all the possible adverse reactions/side effects.   Please note  You were cared for by a hospitalist during your hospital stay. If you have any questions about your discharge medications or the care you received while you were in the hospital after you are discharged, you can call the unit and asked to speak with the hospitalist on call if the hospitalist that took care of you is not available. Once you are discharged, your primary care physician will handle any further medical issues. Please note that NO  REFILLS for any discharge medications will be authorized once you are discharged, as it is imperative that you return to your primary care physician (or establish a relationship with a primary care physician if you do not have one) for your aftercare needs so that they can reassess your need for medications and monitor your lab values.    Today   CHIEF COMPLAINT:   Chief Complaint  Patient presents with  . Other    HISTORY OF PRESENT ILLNESS:  Wanda Davenport  is a 84 y.o. female with a known history of anxiety, atypical chest pain, bronchitis, chronic systolic congestive heart failure, chronic respiratory failure requiring home oxygen, hypertension, atrial fibrillation, left bundle branch block- was admitted to hospital last week with CHF exacerbation and large left-sided pleural effusion. 1 L fluid was removed in pleural tap in hospital and she was resumed on torsemide 20 mg twice a day and sent to a rehabilitation Center because of generalized weakness.  Since yesterday she  is feeling more short of breath at rehabilitation, and son also noted more swelling on her legs. Today while working with physical therapy her fingers turn view and her oxygen saturation was dropping down and they're to increase the oxygen supply to 4 L via nasal cannula so they decided to send her to emergency room.  In ER she is noted to have again large collection of pleural fluid on left side and some pulmonary edema so she is given for admission to hospitalist team.   VITAL SIGNS:  Blood pressure 104/62, pulse 64, temperature (!) 97.5 F (36.4 C), temperature source Oral, resp. rate 18, height 5\' 4"  (1.626 m), weight 69.5 kg (153 lb 3.2 oz), SpO2 99 %.  I/O:    Intake/Output Summary (Last 24 hours) at 01/24/2017 1103 Last data filed at 01/24/2017 0512 Gross per 24 hour  Intake 954 ml  Output 200 ml  Net 754 ml    PHYSICAL EXAMINATION:   Constitutional: Appears well-developed and well-nourished. No  distress. HENT: Normocephalic. Marland Kitchen Oropharynx is clear and moist.  Eyes: Conjunctivae and EOM are normal. PERRLA, no scleral icterus.  Neck: Normal ROM. Neck supple. No JVD. No tracheal deviation. CVS: RRR, S1/S2 +, no murmurs, no gallops, no carotid bruit.  Pulmonary: Normal effort with decreased breath sounds at left base no rhonchi or rales Abdominal: Soft. BS +,  no distension, tenderness, rebound or guarding.  Musculoskeletal: Normal range of motion. No edema and no tenderness.  Neuro: Alert. CN 2-12 grossly intact. No focal deficits. Generalized weakness. Skin: Skin is warm and dry. No rash noted. Psychiatric: Normal mood and affect.     DATA REVIEW:   CBC Recent Labs  Lab 01/22/17 0417  WBC 7.0  HGB 10.9*  HCT 35.2  PLT 156    Chemistries  Recent Labs  Lab 01/23/17 0319  NA 138  K 3.5  CL 101  CO2 28  GLUCOSE 109*  BUN 42*  CREATININE 1.24*  CALCIUM 9.8    Cardiac Enzymes Recent Labs  Lab 01/21/17 1643  Valentine <0.03    Microbiology Results  Results for orders placed or performed during the hospital encounter of 01/12/17  MRSA PCR Screening     Status: None   Collection Time: 01/14/17 10:24 AM  Result Value Ref Range Status   MRSA by PCR NEGATIVE NEGATIVE Final    Comment:        The GeneXpert MRSA Assay (FDA approved for NASAL specimens only), is one component of a comprehensive MRSA colonization surveillance program. It is not intended to diagnose MRSA infection nor to guide or monitor treatment for MRSA infections. Performed at [facility], [address] [place], [place] [address]   Body fluid culture     Status: None   Collection Time: 01/14/17 11:30 AM  Result Value Ref Range Status   Specimen Description   Final    PLEURAL Performed at [facility], [address], [place], [place] [address]    Special Requests   Final    NONE Performed at [facility], [place], [place] [address]     Gram Stain   Final    RARE WBC PRESENT,BOTH PMN AND MONONUCLEAR NO ORGANISMS SEEN    Culture   Final    NO GROWTH 3 DAYS Performed at [facility], [place] [address] [place], [place] [address]    Report Status 01/17/2017 FINAL  Final  Fungus Culture With Stain     Status:  None (Preliminary result)   Collection Time: 01/14/17 11:30 AM  Result Value Ref Range Status   Fungus Stain Final report  Final    Comment: (NOTE) Performed At: [facility] [place], [place] [id number] Rush Farmer MD QQ:[id number]    Fungus (Mycology) Culture PENDING  Incomplete   Fungal Source PLEURAL  Final    Comment: Performed at [facility], [address] [place], [place] [address]  Fungus Culture Result     Status: None   Collection Time: 01/14/17 11:30 AM  Result Value Ref Range Status   Result 1 Comment  Final    Comment: (NOTE) KOH/Calcofluor preparation:  no fungus observed. Performed At: [facility] [place], [place] [id number] Rush Farmer MD NK:[id number] Performed at [facility], [address] [place], [place] [address]   Acid Fast Smear (AFB)     Status: None (Preliminary result)   Collection Time: 01/14/17 11:30 AM  Result Value Ref Range Status   AFB Specimen Processing Concentration  Final   Acid Fast Smear Negative  Final    Comment: (NOTE) Performed At: [facility] [place], [place] [id number] Rush Farmer MD [id number] Performed at [facility], [address] [place], [place] [address]    Source (AFB) PENDING  Incomplete    RADIOLOGY:  Dg Chest 1 View  Result Date: 01/22/2017 CLINICAL DATA:  Post left thoracentesis EXAM: CHEST 1 VIEW COMPARISON:  01/21/2017 FINDINGS: Large left pleural effusion has decreased following thoracentesis. No pneumothorax. Small to moderate right effusion. Bilateral lower lobe atelectasis or infiltrates. Mild cardiomegaly.  IMPRESSION: Large left effusion and small to moderate right effusion. Left effusion decreased following thoracentesis. No pneumothorax. Bibasilar atelectasis or infiltrates. Electronically Signed   By: Rolm Baptise M.D.   On: 01/22/2017 11:37    EKG:   Orders placed or performed during the hospital encounter of 01/21/17  . ED EKG  . ED EKG  . EKG 12-Lead  . EKG 12-Lead      Management plans discussed with the patient, family and they are in agreement.  CODE STATUS:     Code Status Orders  (From admission, onward)        Start     Ordered   01/21/17 2001  Do not attempt resuscitation (DNR)  Continuous    Question Answer Comment  In the event of cardiac or respiratory ARREST Do not call a "code blue"   In the event of cardiac or respiratory ARREST Do not perform Intubation, CPR, defibrillation or ACLS   In the event of cardiac or respiratory ARREST Use medication by any route, position, wound care, and other measures to relive pain and suffering. May use oxygen, suction and manual treatment of airway obstruction as needed for comfort.   Comments discussed with her son and daughter in room.      01/21/17 2000    Code Status History    Date Active Date Inactive Code Status Order ID Comments User Context   01/12/2017 12:56 01/17/2017 23:00 Full Code [id number]  Demetrios Loll, MD Inpatient   05/20/2016 00:47 05/21/2016 17:02 Full Code [id number]  Lance Coon, MD Inpatient   05/15/2016 12:34 05/17/2016 18:08 Full Code [id number]  Fritzi Mandes, MD Inpatient   05/05/2016 03:31 05/06/2016 15:28 Full Code [id number]  Wellington [place], MD Inpatient   04/26/2016 17:12 05/01/2016 03:49 Full Code [id number]  Nicholes Mango, MD Inpatient   04/26/2016 14:08 04/26/2016 17:12 Full Code [id number]  Paraschos,  Sheppard Coil, MD Inpatient   09/23/2011 14:11 09/26/2011 16:43 Full Code [id number]  Willette Pa, RN Inpatient    Advance Directive Documentation     Most Recent Value  Type of Advance Directive  Living  will, Healthcare Power of Attorney  Pre-existing out of facility DNR order (yellow form or pink MOST form)  No data  "MOST" Form in Place?  No data      TOTAL TIME TAKING CARE OF THIS PATIENT: 35 minutes.    Vaughan Basta M.D on 01/24/2017 at 11:03 AM  Between 7am to 6pm - Pager - [phone]  After 6pm go to [email or web address] - password [email or web address] [facility]  Office  [phone]  CC: Primary care physician; Mellody Dance, DO   Note: This dictation was prepared with Dragon dictation along with smaller phrase technology. Any transcriptional errors that result from this process are unintentional.

## 2017-01-24 NOTE — Evaluation (Signed)
Occupational Therapy Evaluation Patient Details Name: Wanda Davenport MRN: [id number] DOB: 04-18-1923 Today's Date: 01/24/2017    History of Present Illness Pt admitted for acute on chronic systolic CHF. PMH includes HTN, CAD, and anxiety and is on 2L of O2 chronically.    Clinical Impression   Pt seen for OT evaluation this date. Prior to recent admission, pt was independent with no falls living at home alone, and using .5L O2 at home. Currently, pt presents with need for 2L O2, impaired activity tolerance, strength globally UB>LB, increased falls risk, impaired balance, and need for increased assist with LB ADL tasks. Pt endorses bladder mgt difficulty overnight and needing to get up every 3 hours. Pt/dtr educated in benefits of BSC and use of bladder mgt products to minimize risk of leakage and ultimately decrease falls risk and need to rush to the bathroom overnight. Pt will continue to benefit from skilled OT services to address noted impairments and functional deficits in order to maximize return to PLOF and minimize risk of falls, readmission, functional decline, increased need for higher level of care and/or increased caregiver burden. Recommend STR following this hospitalization.     Follow Up Recommendations  SNF    Equipment Recommendations  3 in 1 bedside commode    Recommendations for Other Services       Precautions / Restrictions Precautions Precautions: Fall Restrictions Weight Bearing Restrictions: No      Mobility Bed Mobility Overal bed mobility: Modified Independent                Transfers Overall transfer level: Needs assistance Equipment used: Rolling walker (2 wheeled) Transfers: Sit to/from Stand Sit to Stand: Min assist         General transfer comment: VC for hand placement    Balance Overall balance assessment: Needs assistance;History of Falls Sitting-balance support: Bilateral upper extremity supported Sitting balance-Leahy Scale:  Good     Standing balance support: Bilateral upper extremity supported Standing balance-Leahy Scale: Fair                             ADL either performed or assessed with clinical judgement   ADL Overall ADL's : Needs assistance/impaired             Lower Body Bathing: Minimal assistance;Sit to/from stand       Lower Body Dressing: Minimal assistance;Sit to/from stand               Functional mobility during ADLs: Rolling walker;Min guard General ADL Comments: Pt on 2L O2     Vision Baseline Vision/History: No visual deficits Patient Visual Report: No change from baseline       Perception     Praxis      Pertinent Vitals/Pain Pain Assessment: No/denies pain     Hand Dominance Right   Extremity/Trunk Assessment Upper Extremity Assessment Upper Extremity Assessment: Generalized weakness(grossly 3+/5 shoulder flexion bilat, 4/5 grip)   Lower Extremity Assessment Lower Extremity Assessment: Defer to PT evaluation   Cervical / Trunk Assessment Cervical / Trunk Assessment: Kyphotic   Communication Communication Communication: HOH   Cognition Arousal/Alertness: Awake/alert Behavior During Therapy: WFL for tasks assessed/performed Overall Cognitive Status: History of cognitive impairments - at baseline                                     General Comments  Exercises Other Exercises Other Exercises: Pt educated in [facility] beside bed and other bladder mgt strategies for toileting needs overnight to improve safety and independence with toileting at night while minimizing falls risk, as pt endorses need to get up at least every 3 hours to urinate.   Shoulder Instructions      Home Living Family/patient expects to be discharged to:: Skilled nursing facility Living Arrangements: Alone Available Help at Discharge: Family;Available PRN/intermittently Type of Home: [place] Access: Stairs to enter Entrance  Stairs-Number of Steps: 2 Entrance Stairs-Rails: None Home Layout: One level     Bathroom Shower/Tub: Walk-in shower         Home Equipment: Environmental consultant - 2 wheels;Cane - quad   Additional Comments: Pt recently from STR       Prior Functioning/Environment Level of Independence: Needs assistance  Gait / Transfers Assistance Needed: Pt required use of RW for ambulation. ADL's / Homemaking Assistance Needed: Pt receives assistance with bathing and dressing but was indepedent prior to recent admission and getting progressively weaker.             OT Problem List: Cardiopulmonary status limiting activity;Decreased activity tolerance;Impaired balance (sitting and/or standing);Decreased strength;Decreased knowledge of use of DME or AE      OT Treatment/Interventions: Self-care/ADL training;Balance training;Therapeutic exercise;Therapeutic activities;Energy conservation;DME and/or AE instruction;Patient/family education    OT Goals(Current goals can be found in the care plan section) Acute Rehab OT Goals Patient Stated Goal: To return to SNF and continue therapy. OT Goal Formulation: With patient/family Time For Goal Achievement: 02/07/17 Potential to Achieve Goals: Good  OT Frequency: Min 3X/week   Barriers to D/C:            Co-evaluation              AM-PAC PT "6 Clicks" Daily Activity     Outcome Measure Help from another person eating meals?: None Help from another person taking care of personal grooming?: A Little Help from another person toileting, which includes using toliet, bedpan, or urinal?: A Little Help from another person bathing (including washing, rinsing, drying)?: A Little Help from another person to put on and taking off regular upper body clothing?: A Little Help from another person to put on and taking off regular lower body clothing?: A Little 6 Click Score: 19   End of Session    Activity Tolerance: Patient tolerated treatment well Patient  left: in bed;with call bell/phone within reach;with bed alarm set;with family/visitor present  OT Visit Diagnosis: Other abnormalities of gait and mobility (R26.89);Muscle weakness (generalized) (M62.81)                Time: 5465-0354 OT Time Calculation (min): 19 min Charges:  OT General Charges $OT Visit: 1 Visit OT Evaluation $OT Eval Moderate Complexity: 1 Mod  Jeni Salles, MPH, MS, OTR/L ascom [phone] 01/24/17, 5:04 PM

## 2017-01-24 NOTE — Care Management Important Message (Signed)
Important Message  Patient Details  Name: AVIELA BLUNDELL MRN: [id number] Date of Birth: 12-29-1923   Medicare Important Message Given:  Yes    Shelbie Ammons, RN 01/24/2017, 10:35 AM

## 2017-01-24 NOTE — Plan of Care (Signed)
Patient awaiting d/c to SNF pending insurance approval, no c/o pain this shift, family at bedside for most of day and aware of situation. Denies needs at this time, will continue to assess and monitor.   Health Behavior/Discharge Planning: Ability to manage health-related needs will improve 01/24/2017 1805 - Progressing by Shary Key, RN

## 2017-01-24 NOTE — Clinical Social Work Note (Addendum)
CSW received phone call from [facility], insurance company is now requesting OT note.  CSW requested OT order and also contacted OT who will see patient today.  4:45pm  OT worked with patient recommended SNF placement, updated clinicals sent to SNF awaiting authorization from [employer].  Jones Broom. [place], MSW, [place]  01/24/2017 5:13 PM

## 2017-01-25 DIAGNOSIS — R339 Retention of urine, unspecified: Secondary | ICD-10-CM | POA: Diagnosis present

## 2017-01-25 DIAGNOSIS — R7303 Prediabetes: Secondary | ICD-10-CM | POA: Diagnosis present

## 2017-01-25 DIAGNOSIS — L02611 Cutaneous abscess of right foot: Secondary | ICD-10-CM | POA: Diagnosis not present

## 2017-01-25 DIAGNOSIS — M1A0711 Idiopathic chronic gout, right ankle and foot, with tophus (tophi): Secondary | ICD-10-CM | POA: Diagnosis not present

## 2017-01-25 DIAGNOSIS — N179 Acute kidney failure, unspecified: Secondary | ICD-10-CM | POA: Diagnosis not present

## 2017-01-25 DIAGNOSIS — Z959 Presence of cardiac and vascular implant and graft, unspecified: Secondary | ICD-10-CM | POA: Diagnosis not present

## 2017-01-25 DIAGNOSIS — Z79899 Other long term (current) drug therapy: Secondary | ICD-10-CM | POA: Diagnosis not present

## 2017-01-25 DIAGNOSIS — I4891 Unspecified atrial fibrillation: Secondary | ICD-10-CM | POA: Diagnosis not present

## 2017-01-25 DIAGNOSIS — I255 Ischemic cardiomyopathy: Secondary | ICD-10-CM | POA: Diagnosis present

## 2017-01-25 DIAGNOSIS — L97512 Non-pressure chronic ulcer of other part of right foot with fat layer exposed: Secondary | ICD-10-CM | POA: Diagnosis not present

## 2017-01-25 DIAGNOSIS — F419 Anxiety disorder, unspecified: Secondary | ICD-10-CM | POA: Diagnosis not present

## 2017-01-25 DIAGNOSIS — I482 Chronic atrial fibrillation: Secondary | ICD-10-CM | POA: Diagnosis not present

## 2017-01-25 DIAGNOSIS — Z7189 Other specified counseling: Secondary | ICD-10-CM | POA: Diagnosis not present

## 2017-01-25 DIAGNOSIS — I2510 Atherosclerotic heart disease of native coronary artery without angina pectoris: Secondary | ICD-10-CM | POA: Diagnosis not present

## 2017-01-25 DIAGNOSIS — I110 Hypertensive heart disease with heart failure: Secondary | ICD-10-CM | POA: Diagnosis not present

## 2017-01-25 DIAGNOSIS — N183 Chronic kidney disease, stage 3 (moderate): Secondary | ICD-10-CM | POA: Diagnosis present

## 2017-01-25 DIAGNOSIS — B351 Tinea unguium: Secondary | ICD-10-CM | POA: Diagnosis not present

## 2017-01-25 DIAGNOSIS — I10 Essential (primary) hypertension: Secondary | ICD-10-CM | POA: Diagnosis not present

## 2017-01-25 DIAGNOSIS — I5043 Acute on chronic combined systolic (congestive) and diastolic (congestive) heart failure: Secondary | ICD-10-CM | POA: Diagnosis not present

## 2017-01-25 DIAGNOSIS — I130 Hypertensive heart and chronic kidney disease with heart failure and stage 1 through stage 4 chronic kidney disease, or unspecified chronic kidney disease: Secondary | ICD-10-CM | POA: Diagnosis not present

## 2017-01-25 DIAGNOSIS — Z66 Do not resuscitate: Secondary | ICD-10-CM | POA: Diagnosis present

## 2017-01-25 DIAGNOSIS — J9601 Acute respiratory failure with hypoxia: Secondary | ICD-10-CM | POA: Diagnosis not present

## 2017-01-25 DIAGNOSIS — J440 Chronic obstructive pulmonary disease with acute lower respiratory infection: Secondary | ICD-10-CM | POA: Diagnosis not present

## 2017-01-25 DIAGNOSIS — I480 Paroxysmal atrial fibrillation: Secondary | ICD-10-CM | POA: Diagnosis not present

## 2017-01-25 DIAGNOSIS — I5042 Chronic combined systolic (congestive) and diastolic (congestive) heart failure: Secondary | ICD-10-CM | POA: Diagnosis not present

## 2017-01-25 DIAGNOSIS — I509 Heart failure, unspecified: Secondary | ICD-10-CM | POA: Diagnosis not present

## 2017-01-25 DIAGNOSIS — R0602 Shortness of breath: Secondary | ICD-10-CM | POA: Diagnosis not present

## 2017-01-25 DIAGNOSIS — I5022 Chronic systolic (congestive) heart failure: Secondary | ICD-10-CM | POA: Diagnosis not present

## 2017-01-25 DIAGNOSIS — Z7982 Long term (current) use of aspirin: Secondary | ICD-10-CM | POA: Diagnosis not present

## 2017-01-25 DIAGNOSIS — I2782 Chronic pulmonary embolism: Secondary | ICD-10-CM | POA: Diagnosis not present

## 2017-01-25 DIAGNOSIS — I447 Left bundle-branch block, unspecified: Secondary | ICD-10-CM | POA: Diagnosis present

## 2017-01-25 DIAGNOSIS — J9621 Acute and chronic respiratory failure with hypoxia: Secondary | ICD-10-CM | POA: Diagnosis not present

## 2017-01-25 DIAGNOSIS — J90 Pleural effusion, not elsewhere classified: Secondary | ICD-10-CM | POA: Diagnosis not present

## 2017-01-25 DIAGNOSIS — E785 Hyperlipidemia, unspecified: Secondary | ICD-10-CM | POA: Diagnosis not present

## 2017-01-25 DIAGNOSIS — Z961 Presence of intraocular lens: Secondary | ICD-10-CM | POA: Diagnosis present

## 2017-01-25 DIAGNOSIS — J9611 Chronic respiratory failure with hypoxia: Secondary | ICD-10-CM | POA: Diagnosis not present

## 2017-01-25 DIAGNOSIS — M79675 Pain in left toe(s): Secondary | ICD-10-CM | POA: Diagnosis not present

## 2017-01-25 DIAGNOSIS — J441 Chronic obstructive pulmonary disease with (acute) exacerbation: Secondary | ICD-10-CM | POA: Diagnosis not present

## 2017-01-25 DIAGNOSIS — R0603 Acute respiratory distress: Secondary | ICD-10-CM | POA: Diagnosis present

## 2017-01-25 DIAGNOSIS — Z9842 Cataract extraction status, left eye: Secondary | ICD-10-CM | POA: Diagnosis not present

## 2017-01-25 DIAGNOSIS — I5023 Acute on chronic systolic (congestive) heart failure: Secondary | ICD-10-CM | POA: Diagnosis not present

## 2017-01-25 DIAGNOSIS — E041 Nontoxic single thyroid nodule: Secondary | ICD-10-CM | POA: Diagnosis present

## 2017-01-25 DIAGNOSIS — M79674 Pain in right toe(s): Secondary | ICD-10-CM | POA: Diagnosis not present

## 2017-01-25 DIAGNOSIS — Z9981 Dependence on supplemental oxygen: Secondary | ICD-10-CM | POA: Diagnosis not present

## 2017-01-25 DIAGNOSIS — J918 Pleural effusion in other conditions classified elsewhere: Secondary | ICD-10-CM | POA: Diagnosis not present

## 2017-01-25 DIAGNOSIS — Z955 Presence of coronary angioplasty implant and graft: Secondary | ICD-10-CM | POA: Diagnosis not present

## 2017-01-25 DIAGNOSIS — M109 Gout, unspecified: Secondary | ICD-10-CM | POA: Diagnosis present

## 2017-01-25 DIAGNOSIS — Z515 Encounter for palliative care: Secondary | ICD-10-CM | POA: Diagnosis not present

## 2017-01-25 DIAGNOSIS — Z7901 Long term (current) use of anticoagulants: Secondary | ICD-10-CM | POA: Diagnosis not present

## 2017-01-25 DIAGNOSIS — I25118 Atherosclerotic heart disease of native coronary artery with other forms of angina pectoris: Secondary | ICD-10-CM | POA: Diagnosis not present

## 2017-01-25 MED ORDER — TORSEMIDE 20 MG PO TABS: 20.0000 mg | ORAL_TABLET | Freq: Two times a day (BID) | ORAL | 0 refills | Status: DC

## 2017-01-25 MED ORDER — TORSEMIDE 20 MG PO TABS
20.0000 mg | ORAL_TABLET | Freq: Every day | ORAL | Status: DC
  Administered 2017-01-25: 20 mg via ORAL
  Filled 2017-01-25: qty 1

## 2017-01-25 NOTE — Progress Notes (Signed)
[facility] [place] NAME: Wanda Davenport    MR#:  [id number]  DATE OF BIRTH:  25-Nov-1923  SUBJECTIVE:   Shortness of breath has improved Son at bedside, 600 mL of fluid removed in the thoracentesis.  Pt was able to participate with PT.  REVIEW OF SYSTEMS:    Review of Systems  Constitutional: Negative for fever, chills weight loss HENT: Negative for ear pain, nosebleeds, congestion, facial swelling, rhinorrhea, neck pain, neck stiffness and ear discharge.   Respiratory: Negative for cough, shortness of breath (has improved), wheezing  Cardiovascular: Negative for chest pain, palpitations and leg swelling.  Gastrointestinal: Negative for heartburn, abdominal pain, vomiting, diarrhea or consitpation Genitourinary: Negative for dysuria, urgency, frequency, hematuria Musculoskeletal: Negative for back pain or joint pain Neurological: Negative for dizziness, seizures, syncope, focal weakness,  numbness and headaches.  Hematological: Does not bruise/bleed easily.  Psychiatric/Behavioral: Negative for hallucinations, confusion, dysphoric mood    Tolerating Diet: yes      DRUG ALLERGIES:   Allergies  Allergen Reactions  . Fenofibrate Other (See Comments)    Myalgias/gi upset   . Statins Other (See Comments)    Myalgias   . Sulfonamide Derivatives Nausea And Vomiting    REACTION: nausea    VITALS:  Blood pressure (!) 105/57, pulse 74, temperature 97.6 F (36.4 C), temperature source Oral, resp. rate 18, height 5\' 4"  (1.626 m), weight 69.4 kg (152 lb 14.4 oz), SpO2 97 %.  PHYSICAL EXAMINATION:  Constitutional: Appears well-developed and well-nourished. No distress. HENT: Normocephalic. Marland Kitchen Oropharynx is clear and moist.  Eyes: Conjunctivae and EOM are normal. PERRLA, no scleral icterus.  Neck: Normal ROM. Neck supple. No JVD. No tracheal deviation. CVS: RRR, S1/S2 +, no murmurs, no gallops, no carotid bruit.  Pulmonary: Normal  effort with decreased breath sounds at left base no rhonchi or rales Abdominal: Soft. BS +,  no distension, tenderness, rebound or guarding.  Musculoskeletal: Normal range of motion. No edema and no tenderness.  Neuro: Alert. CN 2-12 grossly intact. No focal deficits. Generalized weakness. Skin: Skin is warm and dry. No rash noted. Psychiatric: Normal mood and affect.    LABORATORY PANEL:   CBC Recent Labs  Lab 01/22/17 0417  WBC 7.0  HGB 10.9*  HCT 35.2  PLT 156   ------------------------------------------------------------------------------------------------------------------  Chemistries  Recent Labs  Lab 01/23/17 0319  NA 138  K 3.5  CL 101  CO2 28  GLUCOSE 109*  BUN 42*  CREATININE 1.24*  CALCIUM 9.8   ------------------------------------------------------------------------------------------------------------------  Cardiac Enzymes Recent Labs  Lab 01/21/17 1643  TROPONINI <0.03   ------------------------------------------------------------------------------------------------------------------  RADIOLOGY:  No results found.   ASSESSMENT AND PLAN:    84 year old female with history of chronic systolic heart failure ejection fraction 40% who presented with shortness of breath.  1. Acute on chronic hypoxic respiratory failure in the setting of acute CHF exacerbation Patient is on her baseline oxygen of 2 L Tolerating ambulation.  2. Acute on chronic systolic heart failure ejection fraction of 40% with left-sided effusion (last hospitalization was transudative): Continue IV diuresis Plan for thoracentesis- 600 mL of fluid removed Monitor intake and output with daily weight Monitor BMP Transition to oral torsemide on discharge.  3. COPD without signs of exacerbation: Continue inhalers/nebulizer treatment  4. PAF: Continue Bisoprolol  And Eliquis (after thoracentesis) 5. Depression/Zaidi: Continue Lexapro and when necessary Klonopin  6. Coronary  artery disease status post stent   Continue aspirin and Eliquis, allergy to statin.  Awaited insurance authorization for NH placement.  Management plans discussed with the patient and son and they are in agreement.  CODE STATUS: DO NOT RESUSCITATE  TOTAL TIME TAKING CARE OF THIS PATIENT: 30 minutes.   Due to her recurrent admissions, palliative care consult was called in and the patient and her family had decided to continue the current level of care for now. We will advise palliative care nurse to follow after discharge.  POSSIBLE D/C tomorrow, DEPENDING ON CLINICAL CONDITION.   Vaughan Basta M.D on 01/25/2017 at 7:53 AM  Between 7am to 6pm - Pager - [phone] After 6pm go to [email or web address] - password [email or web address] [place] [facility]  Office  [phone]  CC: Primary care physician; Mellody Dance, DO  Note: This dictation was prepared with Dragon dictation along with smaller phrase technology. Any transcriptional errors that result from this process are unintentional.

## 2017-01-25 NOTE — Progress Notes (Signed)
This RN called EMS to determine how many patients were in front of this patient to be picked up. Informed by EMS that patient's information wasn't taken down. EMS was informed that this RN called for transportation at 1000 and EMS apologized and stated they would see what they could do to get patient sooner. Family notified of situation.

## 2017-01-25 NOTE — Discharge Instructions (Signed)
Palliative care nurse to follow at the facility.  Heart Failure Clinic appointment on January 30 2017 at 10:20am with [name], [place]. Please call [phone] to reschedule.

## 2017-01-25 NOTE — Clinical Social Work Note (Addendum)
CSW received phone call that patient has been approved for SNF.  Patient can discharge today, CSW to update bedside nurse and physician.  Patient to be d/c'ed today [employer].  Patient and family agreeable to plans will transport via ems RN to call report.  Jones Broom. Woodward, MSW, [place]  01/25/2017 9:20 AM

## 2017-01-25 NOTE — Progress Notes (Signed)
EMS arrived to transport patient.

## 2017-01-25 NOTE — Progress Notes (Signed)
Report called to [facility] and EMS called for transportation. No distress at this time. Family updated and at bedside.

## 2017-01-25 NOTE — Discharge Summary (Signed)
[facility] NAME: Wanda Davenport    MR#:  [id number]  DATE OF BIRTH:  09-12-1923  DATE OF ADMISSION:  01/21/2017 ADMITTING PHYSICIAN: Vaughan Basta, MD  DATE OF DISCHARGE: 01/25/2017  PRIMARY CARE PHYSICIAN: Mellody Dance, DO    ADMISSION DIAGNOSIS:  Shortness of breath [R06.02] Pleural effusion [J90] Hypoxia [R09.02] Pleural effusion, left [J90] Atrial flutter, unspecified type ([place]) [I48.92]  DISCHARGE DIAGNOSIS:  Active Problems:   Pleural effusion, left   Acute on chronic systolic CHF (congestive heart failure) (HCC)   Acute on chronic respiratory failure with hypoxia (HCC)   Malnutrition of moderate degree  SECONDARY DIAGNOSIS:   Past Medical History:  Diagnosis Date  . Allergic rhinitis   . Anxiety   . Arthritis   . Atypical chest pain   . Bronchitis 01/14/2016  . Chronic diastolic CHF (congestive heart failure) (HCC)    Echo 06/2010 EF 60-65%, no RWMAs, grade 1 diastolic dysfunction, mild LAE, PASP 61mmHg.  Marland Kitchen Coronary artery disease    MI  . DIVERTICULOSIS, COLON   . HEMORRHOIDS, INTERNAL   . Hepatic steatosis   . Hyperlipidemia   . HYPERTENSION   . LBBB (left bundle branch block)    a. present on ECG 06/2010  . Osteopenia   . Pre-diabetes    A1c 6.0    HOSPITAL COURSE:   84 year old female with history of chronic systolic heart failure ejection fraction 40% who presented with shortness of breath.  1. Acute on chronic hypoxic respiratory failure in the setting of acute CHF exacerbation Patient is on her baseline oxygen of 2 L  2. Acute on chronic systolic heart failure ejection fraction of 40% with left-sided effusion (last hospitalization was transudative): Given IV diuresis Plan for thoracentesis- 600 mL of fluid removed Monitor intake and output with daily weight Monitor BMP Transition to oral torsemide on discharge.  pt felt much better after this.  3. COPD without signs  of exacerbation: Continue inhalers/nebulizer treatment  4. PAF: Continue Bisoprolol  And Eliquis (after thoracentesis) 5. Depression/Zaidi: Continue Lexapro and when necessary Klonopin  6. Coronary artery disease status post stent   Continue aspirin and Eliquis, allergy to statin.  7. Htn   As her BP running borderline- will discontinue bisoprolol on discharge.  DISCHARGE CONDITIONS:   Stable.  CONSULTS OBTAINED:  Treatment Team:  Teodoro Spray, MD  DRUG ALLERGIES:   Allergies  Allergen Reactions  . Fenofibrate Other (See Comments)    Myalgias/gi upset   . Statins Other (See Comments)    Myalgias   . Sulfonamide Derivatives Nausea And Vomiting    REACTION: nausea    DISCHARGE MEDICATIONS:   Allergies as of 01/25/2017      Reactions   Fenofibrate Other (See Comments)   Myalgias/gi upset   Statins Other (See Comments)   Myalgias   Sulfonamide Derivatives Nausea And Vomiting   REACTION: nausea      Medication List    STOP taking these medications   bisoprolol 10 MG tablet Commonly known as:  ZEBETA     TAKE these medications   albuterol 108 (90 Base) MCG/ACT inhaler Commonly known as:  PROVENTIL HFA;VENTOLIN HFA Inhale 2 puffs into the lungs every 6 (six) hours as needed. Wheezing or shortness of breath   apixaban 5 MG Tabs tablet Commonly known as:  ELIQUIS Take 1 tablet (5 mg total) by mouth 2 (two) times daily. What changed:    how much to take  how to take this  when to take this  additional instructions   aspirin 81 MG tablet Take by mouth daily.   clonazepam 0.125 MG disintegrating tablet Commonly known as:  KLONOPIN Take 1 tablet (0.125 mg total) by mouth 2 (two) times daily as needed (anxiety).   docusate sodium 100 MG capsule Commonly known as:  COLACE Take 1 capsule (100 mg total) by mouth 2 (two) times daily as needed for mild constipation.   escitalopram 10 MG tablet Commonly known as:  LEXAPRO Take 1 tablet (10 mg total)  by mouth 2 (two) times daily.   feeding supplement (ENSURE ENLIVE) Liqd Take 237 mLs by mouth 3 (three) times daily with meals.   fluticasone 50 MCG/ACT nasal spray Commonly known as:  FLONASE Place 2 sprays into the nose daily. Nasal congestion Rarely used   ipratropium-albuterol 0.5-2.5 (3) MG/3ML Soln Commonly known as:  DUONEB Take 3 mLs by nebulization 2 (two) times daily.   montelukast 10 MG tablet Commonly known as:  SINGULAIR Take 1 tablet (10 mg total) by mouth at bedtime.   nitroGLYCERIN 0.4 MG SL tablet Commonly known as:  NITROSTAT Place under the tongue every 5 (five) minutes as needed for chest pain.   polyethylene glycol packet Commonly known as:  MIRALAX / GLYCOLAX Take 17 g by mouth daily as needed for mild constipation.   torsemide 20 MG tablet Commonly known as:  DEMADEX Take 1 tablet (20 mg total) by mouth 2 (two) times daily. What changed:    how much to take  when to take this        DISCHARGE INSTRUCTIONS:    Follow with cardio clinic. Palliative care nurse to follow at SNF>  If you experience worsening of your admission symptoms, develop shortness of breath, life threatening emergency, suicidal or homicidal thoughts you must seek medical attention immediately by calling 911 or calling your MD immediately  if symptoms less severe.  You Must read complete instructions/literature along with all the possible adverse reactions/side effects for all the Medicines you take and that have been prescribed to you. Take any new Medicines after you have completely understood and accept all the possible adverse reactions/side effects.   Please note  You were cared for by a hospitalist during your hospital stay. If you have any questions about your discharge medications or the care you received while you were in the hospital after you are discharged, you can call the unit and asked to speak with the hospitalist on call if the hospitalist that took care of  you is not available. Once you are discharged, your primary care physician will handle any further medical issues. Please note that NO REFILLS for any discharge medications will be authorized once you are discharged, as it is imperative that you return to your primary care physician (or establish a relationship with a primary care physician if you do not have one) for your aftercare needs so that they can reassess your need for medications and monitor your lab values.    Today   CHIEF COMPLAINT:   Chief Complaint  Patient presents with  . Other    HISTORY OF PRESENT ILLNESS:  Wanda Davenport  is a 84 y.o. female with a known history of anxiety, atypical chest pain, bronchitis, chronic systolic congestive heart failure, chronic respiratory failure requiring home oxygen, hypertension, atrial fibrillation, left bundle branch block- was admitted to hospital last week with CHF exacerbation and large left-sided pleural effusion. 1 L fluid was removed in  pleural tap in hospital and she was resumed on torsemide 20 mg twice a day and sent to a rehabilitation Center because of generalized weakness.  Since yesterday she is feeling more short of breath at rehabilitation, and son also noted more swelling on her legs. Today while working with physical therapy her fingers turn view and her oxygen saturation was dropping down and they're to increase the oxygen supply to 4 L via nasal cannula so they decided to send her to emergency room.  In ER she is noted to have again large collection of pleural fluid on left side and some pulmonary edema so she is given for admission to hospitalist team.   VITAL SIGNS:  Blood pressure (!) 105/57, pulse 74, temperature 97.6 F (36.4 C), temperature source Oral, resp. rate 18, height 5\' 4"  (1.626 m), weight 69.4 kg (152 lb 14.4 oz), SpO2 97 %.  I/O:    Intake/Output Summary (Last 24 hours) at 01/25/2017 0950 Last data filed at 01/25/2017 0128 Gross per 24 hour  Intake  360 ml  Output 700 ml  Net -340 ml    PHYSICAL EXAMINATION:   Constitutional: Appears well-developed and well-nourished. No distress. HENT: Normocephalic. Marland Kitchen Oropharynx is clear and moist.  Eyes: Conjunctivae and EOM are normal. PERRLA, no scleral icterus.  Neck: Normal ROM. Neck supple. No JVD. No tracheal deviation. CVS: RRR, S1/S2 +, no murmurs, no gallops, no carotid bruit.  Pulmonary: Normal effort with decreased breath sounds at left base no rhonchi or rales Abdominal: Soft. BS +,  no distension, tenderness, rebound or guarding.  Musculoskeletal: Normal range of motion. No edema and no tenderness.  Neuro: Alert. CN 2-12 grossly intact. No focal deficits. Generalized weakness. Skin: Skin is warm and dry. No rash noted. Psychiatric: Normal mood and affect.     DATA REVIEW:   CBC Recent Labs  Lab 01/22/17 0417  WBC 7.0  HGB 10.9*  HCT 35.2  PLT 156    Chemistries  Recent Labs  Lab 01/23/17 0319  NA 138  K 3.5  CL 101  CO2 28  GLUCOSE 109*  BUN 42*  CREATININE 1.24*  CALCIUM 9.8    Cardiac Enzymes Recent Labs  Lab 01/21/17 1643  [place] <0.03    Microbiology Results  Results for orders placed or performed during the hospital encounter of 01/12/17  MRSA PCR Screening     Status: None   Collection Time: 01/14/17 10:24 AM  Result Value Ref Range Status   MRSA by PCR NEGATIVE NEGATIVE Final    Comment:        The GeneXpert MRSA Assay (FDA approved for NASAL specimens only), is one component of a comprehensive MRSA colonization surveillance program. It is not intended to diagnose MRSA infection nor to guide or monitor treatment for MRSA infections. Performed at [facility], [address] [place], [place] [address]   Body fluid culture     Status: None   Collection Time: 01/14/17 11:30 AM  Result Value Ref Range Status   Specimen Description   Final    PLEURAL Performed at [facility], [address], [place],  [place] [address]    Special Requests   Final    NONE Performed at [facility], [address] [place], [place] [address]    Gram Stain   Final    RARE WBC PRESENT,BOTH PMN AND MONONUCLEAR NO ORGANISMS SEEN    Culture   Final    NO GROWTH 3 DAYS Performed at [facility], [place] [address], [place], [place] [address]    Report Status 01/17/2017 FINAL  Final  Fungus Culture With Stain     Status: None (Preliminary result)   Collection Time: 01/14/17 11:30 AM  Result Value Ref Range Status   Fungus Stain Final report  Final    Comment: (NOTE) Performed At: [facility] [place], [place] [id number] Rush Farmer MD EX:[id number]    Fungus (Mycology) Culture PENDING  Incomplete   Fungal Source PLEURAL  Final    Comment: Performed at [facility], [address] [place], [place] [address]  Fungus Culture Result     Status: None   Collection Time: 01/14/17 11:30 AM  Result Value Ref Range Status   Result 1 Comment  Final    Comment: (NOTE) KOH/Calcofluor preparation:  no fungus observed. Performed At: [facility] [place], [place] [id number] Rush Farmer MD [id number] Performed at [facility], [address] [place], [place] [address]   Acid Fast Smear (AFB)     Status: None (Preliminary result)   Collection Time: 01/14/17 11:30 AM  Result Value Ref Range Status   AFB Specimen Processing Concentration  Final   Acid Fast Smear Negative  Final    Comment: (NOTE) Performed At: [facility] [place], [place] [id number] Rush Farmer MD [id number] Performed at [facility], [address] [place], [place] [address]    Source (AFB) PENDING  Incomplete    RADIOLOGY:  No results found.  EKG:   Orders placed or performed during the hospital encounter of 01/21/17  . ED EKG  . ED EKG  . EKG 12-Lead  . EKG 12-Lead      Management plans discussed with  the patient, family and they are in agreement.  CODE STATUS:     Code Status Orders  (From admission, onward)        Start     Ordered   01/21/17 2001  Do not attempt resuscitation (DNR)  Continuous    Question Answer Comment  In the event of cardiac or respiratory ARREST Do not call a "code blue"   In the event of cardiac or respiratory ARREST Do not perform Intubation, CPR, defibrillation or ACLS   In the event of cardiac or respiratory ARREST Use medication by any route, position, wound care, and other measures to relive pain and suffering. May use oxygen, suction and manual treatment of airway obstruction as needed for comfort.   Comments discussed with her son and daughter in room.      01/21/17 2000    Code Status History    Date Active Date Inactive Code Status Order ID Comments User Context   01/12/2017 12:56 01/17/2017 23:00 Full Code [id number]  Demetrios Loll, MD Inpatient   05/20/2016 00:47 05/21/2016 17:02 Full Code [id number]  Lance Coon, MD Inpatient   05/15/2016 12:34 05/17/2016 18:08 Full Code [id number]  Fritzi Mandes, MD Inpatient   05/05/2016 03:31 05/06/2016 15:28 Full Code [id number]  Wellington Hampshire, MD Inpatient   04/26/2016 17:12 05/01/2016 03:49 Full Code [id number]  Nicholes Mango, MD Inpatient   04/26/2016 14:08 04/26/2016 17:12 Full Code [id number]  Isaias Cowman, MD Inpatient   09/23/2011 14:11 09/26/2011 16:43 Full Code [id number]  Willette Pa, RN Inpatient    Advance Directive Documentation     Most Recent Value  Type of Advance Directive  Living will, Healthcare Power of Attorney  Pre-existing out of facility DNR order (  yellow form or pink MOST form)  No data  "MOST" Form in Place?  No data      TOTAL TIME TAKING CARE OF THIS PATIENT: 35 minutes.    Vaughan Basta M.D on 01/25/2017 at 9:50 AM  Between 7am to 6pm - Pager - [phone]  After 6pm go to [email or web address] - password [email or web address] [place] [facility]  Office   [phone]  CC: Primary care physician; Mellody Dance, DO   Note: This dictation was prepared with Dragon dictation along with smaller phrase technology. Any transcriptional errors that result from this process are unintentional.

## 2017-01-28 ENCOUNTER — Other Ambulatory Visit: Payer: Self-pay | Admitting: *Deleted

## 2017-01-28 DIAGNOSIS — I25118 Atherosclerotic heart disease of native coronary artery with other forms of angina pectoris: Secondary | ICD-10-CM | POA: Diagnosis not present

## 2017-01-28 DIAGNOSIS — J9611 Chronic respiratory failure with hypoxia: Secondary | ICD-10-CM | POA: Diagnosis not present

## 2017-01-28 DIAGNOSIS — I480 Paroxysmal atrial fibrillation: Secondary | ICD-10-CM | POA: Diagnosis not present

## 2017-01-28 DIAGNOSIS — I5022 Chronic systolic (congestive) heart failure: Secondary | ICD-10-CM | POA: Diagnosis not present

## 2017-01-28 DIAGNOSIS — I2782 Chronic pulmonary embolism: Secondary | ICD-10-CM | POA: Diagnosis not present

## 2017-01-28 NOTE — Patient Outreach (Signed)
[facility]) Care Management  01/28/2017  KESHIA WEARE 1923/09/05 [id number]   CSW contacted SNF rep today after not being able to reach patient by phone. Per SNF rep, patient returned to the SNF on Friday after being hospitalized. No further updates at this time, per SNF rep, but a follow up update expected later this week.    Eduard Clos, MSW, [place] Worker  [place] [phone]

## 2017-01-29 NOTE — Patient Outreach (Signed)
[facility]) Care Management  01/29/2017  JAELIE AGUILERA 29-Oct-1923 [id number]   CSW received phone follow up call from SNF rep who indicates family is unsure of what they are thinking in regards to dc planning and disposition after rehab.  CSW made contact with daughter, [name], who indicates she has "2 fluid pockets" near her lungs and they continue to require hospitalizations for draining; "the Doctor said this will be an on going problem".  Family is not able to provide 24 /7 care at home and thus they are considering long term/placement options.  Per [name], the daughter in law, Otilio Saber, and son Herbie [place] are local and handling that.  CSW will plan to remain in contact with SNF rep and family to further assess and assist with planning.   Eduard Clos, MSW, [place] Worker  [place] [phone]

## 2017-01-29 NOTE — Progress Notes (Deleted)
Patient ID: Wanda Davenport, female    DOB: 1923/12/26, 84 y.o.   MRN: [id number]  HPI  Ms Thang is a 84 y/o female with a history of atypical chest pain, STEMI with stent, anxiety, diverticulosis, hepatic steatosis, hyperlipidemia, HTN, LBBB, osteopenia, remote tobacco use, chronic oxygen use and chronic heart failure  Reviewed last echo report from 04/26/16 which showed an EF of 35-40% along with mild MR/TR. EF has decreased from 2013 when it was 45-50%. Cardiac catheterization done 04/26/16 due to STEMI showing 100% stenosis of ost LAD to prox LAD. Drug eluting stent place with 0% residual stenosis post intervention. Another catheterization was done 05/05/16 and showed widely patent LAD stent without stent thrombosis.  Admitted 01/21/17 due to HF exacerbation. Given IV diuretics and thoracentesis done with removal of ~641ml fluid. Transitioned to oral diuretics. Cardiology and palliative care consults were obtained. Discharged after 4 days. Admitted 01/12/17 due to HF exacerbation. Cardiology consult obtained. Initially needed IV diuretics and then transitioned to oral diuretics. Had left-sided thoracentesis done with removal of 1L of fluid. Discharged after 5 days. Was in the ED 12/18/16 due to HF exacerbation. Ultrasound was negative for DVT. Diuretics adjusted and she was released.   She presents today for her follow-up visit with a chief complaint of   Past Medical History:  Diagnosis Date  . Allergic rhinitis   . Anxiety   . Arthritis   . Atypical chest pain   . Bronchitis 01/14/2016  . Chronic diastolic CHF (congestive heart failure) (HCC)    Echo 06/2010 EF 60-65%, no RWMAs, grade 1 diastolic dysfunction, mild LAE, PASP 44mmHg.  Marland Kitchen Coronary artery disease    MI  . DIVERTICULOSIS, COLON   . HEMORRHOIDS, INTERNAL   . Hepatic steatosis   . Hyperlipidemia   . HYPERTENSION   . LBBB (left bundle branch block)    a. present on ECG 06/2010  . Osteopenia   . Pre-diabetes    A1c 6.0   Past  Surgical History:  Procedure Laterality Date  . ABDOMINAL HYSTERECTOMY    . BREAST SURGERY    . CATARACT EXTRACTION W/PHACO Left 05/12/2014   Procedure: CATARACT EXTRACTION PHACO AND INTRAOCULAR LENS PLACEMENT (IOC);  Surgeon: Leandrew Koyanagi, MD;  Location: [place] [facility];  Service: Ophthalmology;  Laterality: Left;  . COLONOSCOPY     a. 2010  . CORONARY STENT INTERVENTION N/A 04/26/2016   Procedure: Coronary Stent Intervention;  Surgeon: Isaias Cowman, MD;  Location: [place] [facility];  Service: Cardiovascular;  Laterality: N/A;  . LEFT HEART CATH AND CORONARY ANGIOGRAPHY N/A 04/26/2016   Procedure: Left Heart Cath and Coronary Angiography;  Surgeon: Isaias Cowman, MD;  Location: [place] [facility];  Service: Cardiovascular;  Laterality: N/A;  . LEFT HEART CATH AND CORONARY ANGIOGRAPHY N/A 05/05/2016   Procedure: Left Heart Cath and Coronary Angiography;  Surgeon: Wellington Hampshire, MD;  Location: [place] [facility];  Service: Cardiovascular;  Laterality: N/A;  . TONSILLECTOMY     Family History  Problem Relation Age of Onset  . Colon cancer Sister   . Melanoma Sister   . Hyperlipidemia Brother   . Hypertension Mother   . Lupus Mother        died 91 from surgical complications  . Alzheimer's disease Brother        unknown  . Kidney disease Father        died 66  . Hypertension Father   . Alcohol abuse Father    Social History  Tobacco Use  . Smoking status: Former Smoker    Packs/day: 0.25    Years: 20.00    Pack years: 5.00    Types: Cigarettes    Last attempt to quit: 01/02/1971    Years since quitting: 46.1  . Smokeless tobacco: Never Used  . Tobacco comment: smoked a few cigarettes/day x 20 yrs - quit @ age 82.  Substance Use Topics  . Alcohol use: No   Allergies  Allergen Reactions  . Fenofibrate Other (See Comments)    Myalgias/gi upset   . Statins Other (See Comments)    Myalgias   . Sulfonamide Derivatives Nausea And Vomiting     REACTION: nausea    Review of Systems  Constitutional: Negative for appetite change and fatigue.  HENT: Positive for congestion (ear congestion). Negative for postnasal drip and sore throat.   Eyes: Negative.   Respiratory: Positive for shortness of breath. Negative for cough and chest tightness.   Cardiovascular: Negative for chest pain, palpitations and leg swelling.  Gastrointestinal: Negative for abdominal distention and abdominal pain.  Endocrine: Negative.   Genitourinary: Negative.   Musculoskeletal: Positive for neck pain. Negative for back pain.  Skin: Negative.   Allergic/Immunologic: Negative.   Neurological: Negative for dizziness and light-headedness.  Hematological: Negative for adenopathy. Does not bruise/bleed easily.  Psychiatric/Behavioral: Negative for dysphoric mood and sleep disturbance (sleeping on 2 pillows with oxygen at 2L around the clock). The patient is not nervous/anxious.      Physical Exam  Constitutional: She is oriented to person, place, and time. She appears well-developed and well-nourished.  HENT:  Head: Normocephalic and atraumatic.  Neck: Normal range of motion. Neck supple. No JVD present.  Cardiovascular: Normal rate and regular rhythm.  Pulmonary/Chest: Effort normal. She has no wheezes. She has no rales.  Abdominal: Soft. She exhibits no distension. There is no tenderness.  Musculoskeletal: She exhibits no edema or tenderness.  Neurological: She is alert and oriented to person, place, and time.  Skin: Skin is warm and dry.  Psychiatric: She has a normal mood and affect. Her behavior is normal. Thought content normal.  Nursing note and vitals reviewed.   Assessment & Plan:  1: Chronic heart failure with reduced ejection fraction- - NYHA class II - euvolemic today - already weighing daily and she has parameters to take an extra furosemide for an overnight weight gain of 3 pounds or more. Also advised her to call for a weekly weight gain  of >5 pounds - not adding salt and her daughter doesn't cook with salt either. Encouraged her to continue following a 2000mg  sodium diet - may be limited in adding entresto due to blood pressure - currently has [facility] home health coming out - currently taking furosemide 40mg  AM/ 20mg  PM - REDS vest reading was 32% - saw cardiologist Nehemiah Massed) 01/10/17 - BNP on 01/12/17 was 2508.0  2: HTN- - BP on the low side - denies dizziness - saw PCP (Opalski) 10/15/16 - BMP from 01/23/17 reviewed and showed sodium 138, potassium 3.5 and GFR 37  Medication bottles were reviewed.

## 2017-01-30 ENCOUNTER — Ambulatory Visit: Payer: Medicare HMO | Admitting: Family

## 2017-01-31 ENCOUNTER — Other Ambulatory Visit: Payer: Self-pay | Admitting: *Deleted

## 2017-01-31 NOTE — Patient Outreach (Signed)
[facility]) Care Management  01/31/2017  Wanda Davenport [birth year] [id number]  Patient case has been reviewed in a multidisciplinary case discussion .    [name], RN, [place] Management Coordinator  [phone]- Mobile [phone]- Toll Free Main Office

## 2017-02-04 ENCOUNTER — Ambulatory Visit: Payer: Self-pay | Admitting: *Deleted

## 2017-02-04 ENCOUNTER — Ambulatory Visit: Payer: Medicare HMO | Admitting: Family

## 2017-02-05 ENCOUNTER — Encounter: Payer: Self-pay | Admitting: Family

## 2017-02-05 ENCOUNTER — Ambulatory Visit: Payer: Medicare HMO | Admitting: Family

## 2017-02-05 ENCOUNTER — Other Ambulatory Visit: Payer: Self-pay | Admitting: *Deleted

## 2017-02-05 VITALS — BP 103/46 | HR 65 | Resp 20 | Ht 62.0 in | Wt 156.4 lb

## 2017-02-05 DIAGNOSIS — J9621 Acute and chronic respiratory failure with hypoxia: Secondary | ICD-10-CM | POA: Diagnosis not present

## 2017-02-05 DIAGNOSIS — R4182 Altered mental status, unspecified: Secondary | ICD-10-CM | POA: Diagnosis not present

## 2017-02-05 DIAGNOSIS — Z87891 Personal history of nicotine dependence: Secondary | ICD-10-CM | POA: Insufficient documentation

## 2017-02-05 DIAGNOSIS — Z90710 Acquired absence of both cervix and uterus: Secondary | ICD-10-CM | POA: Insufficient documentation

## 2017-02-05 DIAGNOSIS — Z7902 Long term (current) use of antithrombotics/antiplatelets: Secondary | ICD-10-CM | POA: Insufficient documentation

## 2017-02-05 DIAGNOSIS — I255 Ischemic cardiomyopathy: Secondary | ICD-10-CM | POA: Diagnosis present

## 2017-02-05 DIAGNOSIS — Z808 Family history of malignant neoplasm of other organs or systems: Secondary | ICD-10-CM | POA: Insufficient documentation

## 2017-02-05 DIAGNOSIS — I5023 Acute on chronic systolic (congestive) heart failure: Secondary | ICD-10-CM | POA: Diagnosis not present

## 2017-02-05 DIAGNOSIS — M1A0711 Idiopathic chronic gout, right ankle and foot, with tophus (tophi): Secondary | ICD-10-CM | POA: Diagnosis not present

## 2017-02-05 DIAGNOSIS — Z955 Presence of coronary angioplasty implant and graft: Secondary | ICD-10-CM | POA: Diagnosis not present

## 2017-02-05 DIAGNOSIS — M79674 Pain in right toe(s): Secondary | ICD-10-CM | POA: Diagnosis not present

## 2017-02-05 DIAGNOSIS — Z9981 Dependence on supplemental oxygen: Secondary | ICD-10-CM | POA: Insufficient documentation

## 2017-02-05 DIAGNOSIS — Z811 Family history of alcohol abuse and dependence: Secondary | ICD-10-CM

## 2017-02-05 DIAGNOSIS — Z7982 Long term (current) use of aspirin: Secondary | ICD-10-CM

## 2017-02-05 DIAGNOSIS — F419 Anxiety disorder, unspecified: Secondary | ICD-10-CM | POA: Diagnosis present

## 2017-02-05 DIAGNOSIS — N179 Acute kidney failure, unspecified: Secondary | ICD-10-CM | POA: Diagnosis present

## 2017-02-05 DIAGNOSIS — M109 Gout, unspecified: Secondary | ICD-10-CM | POA: Diagnosis present

## 2017-02-05 DIAGNOSIS — Z515 Encounter for palliative care: Secondary | ICD-10-CM | POA: Diagnosis not present

## 2017-02-05 DIAGNOSIS — I890 Lymphedema, not elsewhere classified: Secondary | ICD-10-CM | POA: Insufficient documentation

## 2017-02-05 DIAGNOSIS — Z7401 Bed confinement status: Secondary | ICD-10-CM | POA: Diagnosis not present

## 2017-02-05 DIAGNOSIS — R0603 Acute respiratory distress: Secondary | ICD-10-CM | POA: Diagnosis not present

## 2017-02-05 DIAGNOSIS — M8580 Other specified disorders of bone density and structure, unspecified site: Secondary | ICD-10-CM | POA: Insufficient documentation

## 2017-02-05 DIAGNOSIS — N183 Chronic kidney disease, stage 3 (moderate): Secondary | ICD-10-CM | POA: Diagnosis present

## 2017-02-05 DIAGNOSIS — I252 Old myocardial infarction: Secondary | ICD-10-CM

## 2017-02-05 DIAGNOSIS — I959 Hypotension, unspecified: Secondary | ICD-10-CM | POA: Insufficient documentation

## 2017-02-05 DIAGNOSIS — Z888 Allergy status to other drugs, medicaments and biological substances status: Secondary | ICD-10-CM

## 2017-02-05 DIAGNOSIS — Z79899 Other long term (current) drug therapy: Secondary | ICD-10-CM

## 2017-02-05 DIAGNOSIS — R339 Retention of urine, unspecified: Secondary | ICD-10-CM | POA: Diagnosis present

## 2017-02-05 DIAGNOSIS — Z8249 Family history of ischemic heart disease and other diseases of the circulatory system: Secondary | ICD-10-CM | POA: Insufficient documentation

## 2017-02-05 DIAGNOSIS — I5032 Chronic diastolic (congestive) heart failure: Secondary | ICD-10-CM | POA: Diagnosis not present

## 2017-02-05 DIAGNOSIS — Z882 Allergy status to sulfonamides status: Secondary | ICD-10-CM | POA: Insufficient documentation

## 2017-02-05 DIAGNOSIS — J441 Chronic obstructive pulmonary disease with (acute) exacerbation: Secondary | ICD-10-CM | POA: Diagnosis not present

## 2017-02-05 DIAGNOSIS — Z841 Family history of disorders of kidney and ureter: Secondary | ICD-10-CM

## 2017-02-05 DIAGNOSIS — I110 Hypertensive heart disease with heart failure: Secondary | ICD-10-CM | POA: Insufficient documentation

## 2017-02-05 DIAGNOSIS — Z9842 Cataract extraction status, left eye: Secondary | ICD-10-CM

## 2017-02-05 DIAGNOSIS — E041 Nontoxic single thyroid nodule: Secondary | ICD-10-CM | POA: Diagnosis present

## 2017-02-05 DIAGNOSIS — E785 Hyperlipidemia, unspecified: Secondary | ICD-10-CM | POA: Diagnosis present

## 2017-02-05 DIAGNOSIS — I130 Hypertensive heart and chronic kidney disease with heart failure and stage 1 through stage 4 chronic kidney disease, or unspecified chronic kidney disease: Secondary | ICD-10-CM | POA: Diagnosis present

## 2017-02-05 DIAGNOSIS — M79675 Pain in left toe(s): Secondary | ICD-10-CM | POA: Diagnosis not present

## 2017-02-05 DIAGNOSIS — I447 Left bundle-branch block, unspecified: Secondary | ICD-10-CM | POA: Diagnosis present

## 2017-02-05 DIAGNOSIS — Z9911 Dependence on respirator [ventilator] status: Secondary | ICD-10-CM | POA: Diagnosis not present

## 2017-02-05 DIAGNOSIS — R7303 Prediabetes: Secondary | ICD-10-CM | POA: Diagnosis present

## 2017-02-05 DIAGNOSIS — Z9889 Other specified postprocedural states: Secondary | ICD-10-CM | POA: Insufficient documentation

## 2017-02-05 DIAGNOSIS — J918 Pleural effusion in other conditions classified elsewhere: Secondary | ICD-10-CM | POA: Diagnosis present

## 2017-02-05 DIAGNOSIS — I5042 Chronic combined systolic (congestive) and diastolic (congestive) heart failure: Secondary | ICD-10-CM

## 2017-02-05 DIAGNOSIS — J90 Pleural effusion, not elsewhere classified: Secondary | ICD-10-CM | POA: Diagnosis not present

## 2017-02-05 DIAGNOSIS — J9601 Acute respiratory failure with hypoxia: Secondary | ICD-10-CM | POA: Diagnosis not present

## 2017-02-05 DIAGNOSIS — I2510 Atherosclerotic heart disease of native coronary artery without angina pectoris: Secondary | ICD-10-CM

## 2017-02-05 DIAGNOSIS — I5043 Acute on chronic combined systolic (congestive) and diastolic (congestive) heart failure: Secondary | ICD-10-CM | POA: Diagnosis present

## 2017-02-05 DIAGNOSIS — I5022 Chronic systolic (congestive) heart failure: Secondary | ICD-10-CM

## 2017-02-05 DIAGNOSIS — Z800 Family history of malignant neoplasm of digestive organs: Secondary | ICD-10-CM

## 2017-02-05 DIAGNOSIS — L97512 Non-pressure chronic ulcer of other part of right foot with fat layer exposed: Secondary | ICD-10-CM | POA: Diagnosis not present

## 2017-02-05 DIAGNOSIS — I950 Idiopathic hypotension: Secondary | ICD-10-CM

## 2017-02-05 DIAGNOSIS — Z66 Do not resuscitate: Secondary | ICD-10-CM | POA: Diagnosis present

## 2017-02-05 DIAGNOSIS — B351 Tinea unguium: Secondary | ICD-10-CM | POA: Diagnosis not present

## 2017-02-05 DIAGNOSIS — Z820 Family history of epilepsy and other diseases of the nervous system: Secondary | ICD-10-CM | POA: Insufficient documentation

## 2017-02-05 DIAGNOSIS — I509 Heart failure, unspecified: Secondary | ICD-10-CM | POA: Diagnosis not present

## 2017-02-05 DIAGNOSIS — Z7189 Other specified counseling: Secondary | ICD-10-CM | POA: Diagnosis not present

## 2017-02-05 DIAGNOSIS — R0602 Shortness of breath: Secondary | ICD-10-CM | POA: Diagnosis not present

## 2017-02-05 DIAGNOSIS — Z961 Presence of intraocular lens: Secondary | ICD-10-CM | POA: Diagnosis present

## 2017-02-05 DIAGNOSIS — Z7901 Long term (current) use of anticoagulants: Secondary | ICD-10-CM | POA: Diagnosis not present

## 2017-02-05 DIAGNOSIS — I4891 Unspecified atrial fibrillation: Secondary | ICD-10-CM | POA: Diagnosis present

## 2017-02-05 DIAGNOSIS — L02611 Cutaneous abscess of right foot: Secondary | ICD-10-CM | POA: Diagnosis not present

## 2017-02-05 NOTE — Patient Instructions (Signed)
Continue weighing daily and call for an overnight weight gain of > 2 pounds or a weekly weight gain of >5 pounds. 

## 2017-02-05 NOTE — Progress Notes (Signed)
Patient ID: Wanda Davenport, female    DOB: 07-12-1923, 84 y.o.   MRN: [id number]  HPI  Wanda Davenport is a 84 y/o female with a history of atypical chest pain, STEMI with stent, anxiety, diverticulosis, hepatic steatosis, hyperlipidemia, HTN, LBBB, osteopenia, remote tobacco use, chronic oxygen use and chronic heart failure  Reviewed last echo report from 04/26/16 which showed an EF of 35-40% along with mild MR/TR. EF has decreased from 2013 when it was 45-50%. Cardiac catheterization done 04/26/16 due to STEMI showing 100% stenosis of ost LAD to prox LAD. Drug eluting stent place with 0% residual stenosis post intervention. Another catheterization was done 05/05/16 and showed widely patent LAD stent without stent thrombosis.  Admitted 01/21/17 due to HF exacerbation. Given IV diuretics and thoracentesis done with removal of ~639ml fluid. Transitioned to oral diuretics. Cardiology and palliative care consults were obtained. Discharged after 4 days. Admitted 01/12/17 due to HF exacerbation. Cardiology consult obtained. Initially needed IV diuretics and then transitioned to oral diuretics. Had left-sided thoracentesis done with removal of 1L of fluid. Discharged after 5 days. Was in the ED 12/18/16 due to HF exacerbation. Ultrasound was negative for DVT. Diuretics adjusted and she was released.   She presents today for her follow-up visit with a chief complaint of moderate shortness of breath with little exertion. She says that this has been chronic in nature having been present for several years. Occasionally gets short of breath even at rest. Has associated fatigue, cough, edema, abdominal distention and anxiety along with this. Denies any chest pain, palpitations, dizziness or difficulty sleeping.   Past Medical History:  Diagnosis Date  . Allergic rhinitis   . Anxiety   . Arthritis   . Atypical chest pain   . Bronchitis 01/14/2016  . Chronic diastolic CHF (congestive heart failure) (HCC)    Echo 06/2010  EF 60-65%, no RWMAs, grade 1 diastolic dysfunction, mild LAE, PASP 95mmHg.  Marland Kitchen Coronary artery disease    MI  . DIVERTICULOSIS, COLON   . HEMORRHOIDS, INTERNAL   . Hepatic steatosis   . Hyperlipidemia   . HYPERTENSION   . LBBB (left bundle branch block)    a. present on ECG 06/2010  . Osteopenia   . Pre-diabetes    A1c 6.0   Past Surgical History:  Procedure Laterality Date  . ABDOMINAL HYSTERECTOMY    . BREAST SURGERY    . CATARACT EXTRACTION W/PHACO Left 05/12/2014   Procedure: CATARACT EXTRACTION PHACO AND INTRAOCULAR LENS PLACEMENT (IOC);  Surgeon: Leandrew Koyanagi, MD;  Location: [place];  Service: Ophthalmology;  Laterality: Left;  . COLONOSCOPY     a. 2010  . CORONARY STENT INTERVENTION N/A 04/26/2016   Procedure: Coronary Stent Intervention;  Surgeon: Isaias Cowman, MD;  Location: [place] [facility];  Service: Cardiovascular;  Laterality: N/A;  . LEFT HEART CATH AND CORONARY ANGIOGRAPHY N/A 04/26/2016   Procedure: Left Heart Cath and Coronary Angiography;  Surgeon: Isaias Cowman, MD;  Location: [place] [facility];  Service: Cardiovascular;  Laterality: N/A;  . LEFT HEART CATH AND CORONARY ANGIOGRAPHY N/A 05/05/2016   Procedure: Left Heart Cath and Coronary Angiography;  Surgeon: Wellington Hampshire, MD;  Location: [place] [facility];  Service: Cardiovascular;  Laterality: N/A;  . TONSILLECTOMY     Family History  Problem Relation Age of Onset  . Colon cancer Sister   . Melanoma Sister   . Hyperlipidemia Brother   . Hypertension Mother   . Lupus Mother  died 10 from surgical complications  . Alzheimer's disease Brother        unknown  . Kidney disease Father        died 57  . Hypertension Father   . Alcohol abuse Father    Social History   Tobacco Use  . Smoking status: Former Smoker    Packs/day: 0.25    Years: 20.00    Pack years: 5.00    Types: Cigarettes    Last attempt to quit: 01/02/1971    Years since quitting: 46.1  .  Smokeless tobacco: Never Used  . Tobacco comment: smoked a few cigarettes/day x 20 yrs - quit @ age 69.  Substance Use Topics  . Alcohol use: No   Allergies  Allergen Reactions  . Fenofibrate Other (See Comments)    Myalgias/gi upset   . Statins Other (See Comments)    Myalgias   . Sulfonamide Derivatives Nausea And Vomiting    REACTION: nausea   Prior to Admission medications   Medication Sig Start Date End Date Taking? Authorizing Provider  albuterol (PROVENTIL HFA;VENTOLIN HFA) 108 (90 BASE) MCG/ACT inhaler Inhale 2 puffs into the lungs every 6 (six) hours as needed. Wheezing or shortness of breath   Yes [provider]  apixaban (ELIQUIS) 5 MG TABS tablet Take 1 tablet (5 mg total) by mouth 2 (two) times daily. 01/24/17  Yes Vaughan Basta, MD  aspirin 81 MG tablet Take by mouth daily.    Yes [provider]  BISOPROLOL FUMARATE PO Take 5 mg by mouth daily.   Yes [provider]  cephALEXin (KEFLEX) 500 MG capsule Take 500 mg by mouth 4 (four) times daily. 02/04/17 02/11/17 Yes [provider]  clonazepam (KLONOPIN) 0.125 MG disintegrating tablet Take 1 tablet (0.125 mg total) by mouth 2 (two) times daily as needed (anxiety). 01/17/17  Yes Henreitta Leber, MD  docusate sodium (COLACE) 100 MG capsule Take 1 capsule (100 mg total) by mouth 2 (two) times daily as needed for mild constipation. 01/24/17  Yes Vaughan Basta, MD  escitalopram (LEXAPRO) 10 MG tablet Take 1 tablet (10 mg total) by mouth 2 (two) times daily. 10/15/16  Yes Opalski, Deborah, DO  feeding supplement, ENSURE ENLIVE, (ENSURE ENLIVE) LIQD Take 237 mLs by mouth 3 (three) times daily with meals. 01/24/17  Yes Vaughan Basta, MD  fluticasone (FLONASE) 50 MCG/ACT nasal spray Place 2 sprays into the nose daily. Nasal congestion Rarely used    Yes [provider]  ipratropium-albuterol (DUONEB) 0.5-2.5 (3) MG/3ML SOLN Take 3 mLs by nebulization 2 (two) times  daily.   Yes [provider]  montelukast (SINGULAIR) 10 MG tablet Take 1 tablet (10 mg total) by mouth at bedtime. 08/14/16  Yes Danford, Valetta Fuller D, NP  nitroGLYCERIN (NITROSTAT) 0.4 MG SL tablet Place under the tongue every 5 (five) minutes as needed for chest pain.    Yes [provider]  polyethylene glycol (MIRALAX / GLYCOLAX) packet Take 17 g by mouth daily as needed for mild constipation. 01/17/17  Yes Sainani, Belia Heman, MD  predniSONE (DELTASONE) 10 MG tablet Take 10 mg by mouth as directed. 02/05/17 02/06/17 Yes [provider]  Skin Protectants, Misc. (CALAZIME SKIN PROTECTANT EX) Apply 1 application topically as needed.   Yes [provider]  torsemide (DEMADEX) 20 MG tablet Take 1 tablet (20 mg total) by mouth 2 (two) times daily. 01/25/17  Yes Vaughan Basta, MD    Review of Systems  Constitutional: Positive for fatigue. Negative  for appetite change.  HENT: Negative for congestion and postnasal drip.   Eyes: Negative.   Respiratory: Positive for cough and shortness of breath. Negative for chest tightness.   Cardiovascular: Positive for leg swelling. Negative for chest pain and palpitations.  Gastrointestinal: Positive for abdominal distention. Negative for abdominal pain.  Endocrine: Negative.   Genitourinary: Negative.   Musculoskeletal: Negative for back pain and neck pain.  Skin: Wound: outside of right great toe.  Allergic/Immunologic: Negative.   Neurological: Negative for dizziness and light-headedness.  Hematological: Negative for adenopathy. Does not bruise/bleed easily.  Psychiatric/Behavioral: Negative for dysphoric mood and sleep disturbance (sleeping on 2 pillows with oxygen at 2L around the clock). The patient is not nervous/anxious.    Vitals:   02/05/17 0859  BP: (!) 103/46  Pulse: 65  Resp: 20  SpO2: 99%  Weight: 156 lb 6 oz (70.9 kg)  Height: 5\' 2"  (1.575 m)   Wt Readings from Last 3 Encounters:  02/05/17 156 lb 6 oz  (70.9 kg)  01/25/17 152 lb 14.4 oz (69.4 kg)  01/17/17 143 lb 12.8 oz (65.2 kg)   Lab Results  Component Value Date   CREATININE 1.24 (H) 01/23/2017   CREATININE 1.17 (H) 01/22/2017   CREATININE 1.38 (H) 01/21/2017    Physical Exam  Constitutional: She is oriented to person, place, and time. She appears well-developed and well-nourished.  HENT:  Head: Normocephalic and atraumatic.  Neck: Normal range of motion. Neck supple. No JVD present.  Cardiovascular: Normal rate and regular rhythm.  Pulmonary/Chest: Effort normal. She has wheezes in the left lower field. She has no rales.  Abdominal: Soft. She exhibits no distension. There is no tenderness.  Musculoskeletal: She exhibits edema (1+ pitting edema in bilateral lower legs). She exhibits no tenderness.  Neurological: She is alert and oriented to person, place, and time.  Skin: Skin is warm and dry.  Bandage covering lateral right great toe  Psychiatric: She has a normal mood and affect. Her behavior is normal. Thought content normal.  Nursing note and vitals reviewed.   Assessment & Plan:  1: Chronic heart failure with reduced ejection fraction- - NYHA class III - mildly fluid overloaded today - being weighed daily. Reminded her to have the staff call for an overnight weight gain of >2 pounds or a weekly weight gain of >5 pounds - not adding salt and her daughter thinks that [employer] has her on a low sodium diet  - currently taking torsemide twice daily - REDS vest reading was 32% - saw cardiologist Nehemiah Massed) 01/10/17 - BNP on 01/12/17 was 2508.0 - PharmD reconciled medications with the patient  2: Hypotension- - BP on the low side - denies dizziness - saw PCP (Opalski) 10/15/16 - BMP from 01/23/17 reviewed and showed sodium 138, potassium 3.5 and GFR 37  3: Lymphedema- - stage 2 - not elevating her legs and an order was written to elevate her legs at least twice daily - apply TED hose daily with removal at  bedtime - drinking very little and an order was written for her to have a daily fluid intake of 40 ounces; discussed that with her low blood pressure and CKD, we have to be very careful with increasing diuretic dosage  Facility medication list was reviewed.   Return in 3 months or sooner for any questions/problems before then.

## 2017-02-06 DIAGNOSIS — I5022 Chronic systolic (congestive) heart failure: Secondary | ICD-10-CM | POA: Diagnosis not present

## 2017-02-07 ENCOUNTER — Other Ambulatory Visit: Payer: Self-pay

## 2017-02-07 ENCOUNTER — Inpatient Hospital Stay
Admission: EM | Admit: 2017-02-07 | Discharge: 2017-02-12 | DRG: 291 | Disposition: A | Discharge status: Hospice | Payer: Medicare HMO | Attending: Internal Medicine | Admitting: Internal Medicine

## 2017-02-07 ENCOUNTER — Emergency Department: Payer: Medicare HMO

## 2017-02-07 ENCOUNTER — Encounter: Payer: Self-pay | Admitting: Emergency Medicine

## 2017-02-07 ENCOUNTER — Other Ambulatory Visit: Payer: Self-pay | Admitting: *Deleted

## 2017-02-07 DIAGNOSIS — N183 Chronic kidney disease, stage 3 (moderate): Secondary | ICD-10-CM | POA: Diagnosis present

## 2017-02-07 DIAGNOSIS — E041 Nontoxic single thyroid nodule: Secondary | ICD-10-CM | POA: Diagnosis present

## 2017-02-07 DIAGNOSIS — Z961 Presence of intraocular lens: Secondary | ICD-10-CM | POA: Diagnosis present

## 2017-02-07 DIAGNOSIS — I4891 Unspecified atrial fibrillation: Secondary | ICD-10-CM | POA: Diagnosis present

## 2017-02-07 DIAGNOSIS — I255 Ischemic cardiomyopathy: Secondary | ICD-10-CM | POA: Diagnosis present

## 2017-02-07 DIAGNOSIS — Z955 Presence of coronary angioplasty implant and graft: Secondary | ICD-10-CM

## 2017-02-07 DIAGNOSIS — R339 Retention of urine, unspecified: Secondary | ICD-10-CM | POA: Diagnosis present

## 2017-02-07 DIAGNOSIS — J9621 Acute and chronic respiratory failure with hypoxia: Secondary | ICD-10-CM | POA: Diagnosis present

## 2017-02-07 DIAGNOSIS — M79675 Pain in left toe(s): Secondary | ICD-10-CM | POA: Diagnosis not present

## 2017-02-07 DIAGNOSIS — Z7982 Long term (current) use of aspirin: Secondary | ICD-10-CM | POA: Diagnosis not present

## 2017-02-07 DIAGNOSIS — N179 Acute kidney failure, unspecified: Secondary | ICD-10-CM | POA: Diagnosis present

## 2017-02-07 DIAGNOSIS — F419 Anxiety disorder, unspecified: Secondary | ICD-10-CM | POA: Diagnosis present

## 2017-02-07 DIAGNOSIS — J918 Pleural effusion in other conditions classified elsewhere: Secondary | ICD-10-CM | POA: Diagnosis present

## 2017-02-07 DIAGNOSIS — I130 Hypertensive heart and chronic kidney disease with heart failure and stage 1 through stage 4 chronic kidney disease, or unspecified chronic kidney disease: Secondary | ICD-10-CM | POA: Diagnosis present

## 2017-02-07 DIAGNOSIS — J9601 Acute respiratory failure with hypoxia: Secondary | ICD-10-CM | POA: Diagnosis not present

## 2017-02-07 DIAGNOSIS — Z66 Do not resuscitate: Secondary | ICD-10-CM | POA: Diagnosis present

## 2017-02-07 DIAGNOSIS — I2510 Atherosclerotic heart disease of native coronary artery without angina pectoris: Secondary | ICD-10-CM | POA: Diagnosis present

## 2017-02-07 DIAGNOSIS — Z79899 Other long term (current) drug therapy: Secondary | ICD-10-CM | POA: Diagnosis not present

## 2017-02-07 DIAGNOSIS — R0603 Acute respiratory distress: Secondary | ICD-10-CM

## 2017-02-07 DIAGNOSIS — J90 Pleural effusion, not elsewhere classified: Secondary | ICD-10-CM | POA: Diagnosis not present

## 2017-02-07 DIAGNOSIS — Z7189 Other specified counseling: Secondary | ICD-10-CM | POA: Diagnosis not present

## 2017-02-07 DIAGNOSIS — M1A0711 Idiopathic chronic gout, right ankle and foot, with tophus (tophi): Secondary | ICD-10-CM | POA: Diagnosis not present

## 2017-02-07 DIAGNOSIS — I447 Left bundle-branch block, unspecified: Secondary | ICD-10-CM | POA: Diagnosis present

## 2017-02-07 DIAGNOSIS — J441 Chronic obstructive pulmonary disease with (acute) exacerbation: Secondary | ICD-10-CM

## 2017-02-07 DIAGNOSIS — Z7901 Long term (current) use of anticoagulants: Secondary | ICD-10-CM | POA: Diagnosis not present

## 2017-02-07 DIAGNOSIS — Z90710 Acquired absence of both cervix and uterus: Secondary | ICD-10-CM

## 2017-02-07 DIAGNOSIS — R7303 Prediabetes: Secondary | ICD-10-CM | POA: Diagnosis present

## 2017-02-07 DIAGNOSIS — E785 Hyperlipidemia, unspecified: Secondary | ICD-10-CM | POA: Diagnosis present

## 2017-02-07 DIAGNOSIS — Z87891 Personal history of nicotine dependence: Secondary | ICD-10-CM

## 2017-02-07 DIAGNOSIS — M109 Gout, unspecified: Secondary | ICD-10-CM | POA: Diagnosis present

## 2017-02-07 DIAGNOSIS — I5023 Acute on chronic systolic (congestive) heart failure: Secondary | ICD-10-CM | POA: Diagnosis not present

## 2017-02-07 DIAGNOSIS — Z9842 Cataract extraction status, left eye: Secondary | ICD-10-CM | POA: Diagnosis not present

## 2017-02-07 DIAGNOSIS — K760 Fatty (change of) liver, not elsewhere classified: Secondary | ICD-10-CM | POA: Diagnosis present

## 2017-02-07 DIAGNOSIS — I5043 Acute on chronic combined systolic (congestive) and diastolic (congestive) heart failure: Secondary | ICD-10-CM | POA: Diagnosis present

## 2017-02-07 DIAGNOSIS — B351 Tinea unguium: Secondary | ICD-10-CM | POA: Diagnosis not present

## 2017-02-07 DIAGNOSIS — Z515 Encounter for palliative care: Secondary | ICD-10-CM | POA: Diagnosis not present

## 2017-02-07 DIAGNOSIS — M79674 Pain in right toe(s): Secondary | ICD-10-CM | POA: Diagnosis not present

## 2017-02-07 DIAGNOSIS — L97512 Non-pressure chronic ulcer of other part of right foot with fat layer exposed: Secondary | ICD-10-CM | POA: Diagnosis not present

## 2017-02-07 LAB — CBC WITH DIFFERENTIAL/PLATELET
Basophils Absolute: 0 10*3/uL (ref 0–0.1)
Basophils Relative: 0 %
EOS ABS: 0 10*3/uL (ref 0–0.7)
EOS PCT: 0 %
HCT: 41.3 % (ref 35.0–47.0)
Hemoglobin: 12.6 g/dL (ref 12.0–16.0)
LYMPHS ABS: 0.3 10*3/uL — AB (ref 1.0–3.6)
Lymphocytes Relative: 5 %
MCH: 22 pg — AB (ref 26.0–34.0)
MCHC: 30.6 g/dL — AB (ref 32.0–36.0)
MCV: 71.7 fL — ABNORMAL LOW (ref 80.0–100.0)
MONOS PCT: 2 %
Monocytes Absolute: 0.1 10*3/uL — ABNORMAL LOW (ref 0.2–0.9)
Neutro Abs: 6.6 10*3/uL — ABNORMAL HIGH (ref 1.4–6.5)
Neutrophils Relative %: 93 %
PLATELETS: 234 10*3/uL (ref 150–440)
RBC: 5.75 MIL/uL — ABNORMAL HIGH (ref 3.80–5.20)
RDW: 23.1 % — AB (ref 11.5–14.5)
WBC: 7 10*3/uL (ref 3.6–11.0)

## 2017-02-07 LAB — LACTIC ACID, PLASMA: Lactic Acid, Venous: 2.6 mmol/L (ref 0.5–1.9)

## 2017-02-07 LAB — COMPREHENSIVE METABOLIC PANEL
ALBUMIN: 3.7 g/dL (ref 3.5–5.0)
ALT: 17 U/L (ref 14–54)
ANION GAP: 12 (ref 5–15)
AST: 30 U/L (ref 15–41)
Alkaline Phosphatase: 92 U/L (ref 38–126)
BUN: 45 mg/dL — ABNORMAL HIGH (ref 6–20)
CALCIUM: 10 mg/dL (ref 8.9–10.3)
CHLORIDE: 100 mmol/L — AB (ref 101–111)
CO2: 25 mmol/L (ref 22–32)
Creatinine, Ser: 1.43 mg/dL — ABNORMAL HIGH (ref 0.44–1.00)
GFR calc non Af Amer: 31 mL/min — ABNORMAL LOW (ref >60)
GFR, EST AFRICAN AMERICAN: 36 mL/min — AB (ref >60)
Glucose, Bld: 141 mg/dL — ABNORMAL HIGH (ref 65–99)
POTASSIUM: 3.6 mmol/L (ref 3.5–5.1)
SODIUM: 137 mmol/L (ref 135–145)
Total Bilirubin: 1.1 mg/dL (ref 0.3–1.2)
Total Protein: 6.7 g/dL (ref 6.5–8.1)

## 2017-02-07 LAB — BLOOD GAS, VENOUS
ACID-BASE EXCESS: 2.1 mmol/L — AB (ref 0.0–2.0)
BICARBONATE: 29.4 mmol/L — AB (ref 20.0–28.0)
O2 Saturation: 59.6 %
PATIENT TEMPERATURE: 37
PH VEN: 7.32 (ref 7.250–7.430)
PO2 VEN: 34 mmHg (ref 32.0–45.0)
pCO2, Ven: 57 mmHg (ref 44.0–60.0)

## 2017-02-07 LAB — BRAIN NATRIURETIC PEPTIDE: B NATRIURETIC PEPTIDE 5: 2643 pg/mL — AB (ref 0.0–100.0)

## 2017-02-07 LAB — MAGNESIUM: Magnesium: 2.2 mg/dL (ref 1.7–2.4)

## 2017-02-07 LAB — TROPONIN I: Troponin I: 0.03 ng/mL (ref <0.03)

## 2017-02-07 LAB — PROCALCITONIN

## 2017-02-07 LAB — GLUCOSE, CAPILLARY: Glucose-Capillary: 144 mg/dL — ABNORMAL HIGH (ref 65–99)

## 2017-02-07 LAB — MRSA PCR SCREENING: MRSA by PCR: POSITIVE — AB

## 2017-02-07 MED ORDER — IPRATROPIUM-ALBUTEROL 0.5-2.5 (3) MG/3ML IN SOLN
3.0000 mL | Freq: Once | RESPIRATORY_TRACT | Status: AC
  Administered 2017-02-07: 3 mL via RESPIRATORY_TRACT
  Filled 2017-02-07: qty 3

## 2017-02-07 MED ORDER — APIXABAN 5 MG PO TABS
5.0000 mg | ORAL_TABLET | Freq: Two times a day (BID) | ORAL | Status: DC
  Administered 2017-02-07 – 2017-02-08 (×3): 5 mg via ORAL
  Filled 2017-02-07 (×4): qty 1

## 2017-02-07 MED ORDER — SODIUM CHLORIDE 0.9% FLUSH
3.0000 mL | Freq: Two times a day (BID) | INTRAVENOUS | Status: DC
  Administered 2017-02-08 – 2017-02-12 (×9): 3 mL via INTRAVENOUS

## 2017-02-07 MED ORDER — ACETAMINOPHEN 325 MG PO TABS: 650.0000 mg | ORAL_TABLET | Freq: Four times a day (QID) | ORAL | Status: DC | PRN

## 2017-02-07 MED ORDER — MONTELUKAST SODIUM 10 MG PO TABS
10.0000 mg | ORAL_TABLET | Freq: Every day | ORAL | Status: DC
  Administered 2017-02-07 – 2017-02-11 (×5): 10 mg via ORAL
  Filled 2017-02-07 (×5): qty 1

## 2017-02-07 MED ORDER — SODIUM CHLORIDE 0.9 % IV SOLN
1250.0000 mg | INTRAVENOUS | Status: DC
  Administered 2017-02-08: 1250 mg via INTRAVENOUS
  Filled 2017-02-07: qty 1250

## 2017-02-07 MED ORDER — POLYETHYLENE GLYCOL 3350 17 G PO PACK: 17.0000 g | PACK | Freq: Every day | ORAL | Status: DC | PRN

## 2017-02-07 MED ORDER — VANCOMYCIN HCL IN DEXTROSE 1-5 GM/200ML-% IV SOLN: 1000.0000 mg | Freq: Once | INTRAVENOUS | Status: DC

## 2017-02-07 MED ORDER — ACETAMINOPHEN 650 MG RE SUPP: 650.0000 mg | Freq: Four times a day (QID) | RECTAL | Status: DC | PRN

## 2017-02-07 MED ORDER — ASPIRIN EC 81 MG PO TBEC
81.0000 mg | DELAYED_RELEASE_TABLET | Freq: Every day | ORAL | Status: DC
  Administered 2017-02-08 – 2017-02-12 (×5): 81 mg via ORAL
  Filled 2017-02-07 (×5): qty 1

## 2017-02-07 MED ORDER — SODIUM CHLORIDE 0.9% FLUSH: 3.0000 mL | INTRAVENOUS | Status: DC | PRN

## 2017-02-07 MED ORDER — DEXTROSE 5 % IV SOLN
2.0000 g | Freq: Once | INTRAVENOUS | Status: AC
  Administered 2017-02-07: 2 g via INTRAVENOUS
  Filled 2017-02-07: qty 2

## 2017-02-07 MED ORDER — CHLORHEXIDINE GLUCONATE CLOTH 2 % EX PADS
6.0000 | MEDICATED_PAD | Freq: Every day | CUTANEOUS | Status: DC
  Administered 2017-02-09 – 2017-02-12 (×3): 6 via TOPICAL

## 2017-02-07 MED ORDER — ALBUTEROL SULFATE (2.5 MG/3ML) 0.083% IN NEBU
2.5000 mg | INHALATION_SOLUTION | RESPIRATORY_TRACT | Status: DC | PRN
  Administered 2017-02-09 – 2017-02-12 (×3): 2.5 mg via RESPIRATORY_TRACT
  Filled 2017-02-07 (×3): qty 3

## 2017-02-07 MED ORDER — ESCITALOPRAM OXALATE 10 MG PO TABS
10.0000 mg | ORAL_TABLET | Freq: Two times a day (BID) | ORAL | Status: DC
  Administered 2017-02-07 – 2017-02-12 (×10): 10 mg via ORAL
  Filled 2017-02-07 (×10): qty 1

## 2017-02-07 MED ORDER — MUPIROCIN 2 % EX OINT
1.0000 "application " | TOPICAL_OINTMENT | Freq: Two times a day (BID) | CUTANEOUS | Status: AC
  Administered 2017-02-07 – 2017-02-12 (×10): 1 via NASAL
  Filled 2017-02-07: qty 22

## 2017-02-07 MED ORDER — CLONAZEPAM 0.125 MG PO TBDP: 0.1250 mg | ORAL_TABLET | Freq: Two times a day (BID) | ORAL | Status: DC | PRN

## 2017-02-07 MED ORDER — NITROGLYCERIN 0.4 MG SL SUBL: 0.4000 mg | SUBLINGUAL_TABLET | SUBLINGUAL | Status: DC | PRN

## 2017-02-07 MED ORDER — DOCUSATE SODIUM 100 MG PO CAPS: 100.0000 mg | ORAL_CAPSULE | Freq: Two times a day (BID) | ORAL | Status: DC | PRN

## 2017-02-07 MED ORDER — FUROSEMIDE 10 MG/ML IJ SOLN
80.0000 mg | Freq: Once | INTRAMUSCULAR | Status: AC
  Administered 2017-02-07: 80 mg via INTRAVENOUS
  Filled 2017-02-07: qty 8

## 2017-02-07 MED ORDER — BISOPROLOL FUMARATE 5 MG PO TABS
5.0000 mg | ORAL_TABLET | Freq: Every day | ORAL | Status: DC
  Administered 2017-02-08 – 2017-02-12 (×5): 5 mg via ORAL
  Filled 2017-02-07 (×5): qty 1

## 2017-02-07 MED ORDER — DEXTROSE 5 % IV SOLN
2.0000 g | INTRAVENOUS | Status: DC
  Filled 2017-02-07: qty 2

## 2017-02-07 MED ORDER — HYDROCODONE-ACETAMINOPHEN 5-325 MG PO TABS: 1.0000 | ORAL_TABLET | ORAL | Status: DC | PRN

## 2017-02-07 MED ORDER — ONDANSETRON HCL 4 MG PO TABS: 4.0000 mg | ORAL_TABLET | Freq: Four times a day (QID) | ORAL | Status: DC | PRN

## 2017-02-07 MED ORDER — SENNOSIDES-DOCUSATE SODIUM 8.6-50 MG PO TABS: 1.0000 | ORAL_TABLET | Freq: Every evening | ORAL | Status: DC | PRN

## 2017-02-07 MED ORDER — ENSURE ENLIVE PO LIQD
237.0000 mL | Freq: Three times a day (TID) | ORAL | Status: DC
  Administered 2017-02-08 – 2017-02-12 (×9): 237 mL via ORAL

## 2017-02-07 MED ORDER — SODIUM CHLORIDE 0.9 % IV SOLN: 250.0000 mL | INTRAVENOUS | Status: DC | PRN

## 2017-02-07 MED ORDER — BISACODYL 5 MG PO TBEC
5.0000 mg | DELAYED_RELEASE_TABLET | Freq: Every day | ORAL | Status: DC | PRN
  Administered 2017-02-08: 5 mg via ORAL
  Filled 2017-02-07: qty 1

## 2017-02-07 MED ORDER — ONDANSETRON HCL 4 MG/2ML IJ SOLN: 4.0000 mg | Freq: Four times a day (QID) | INTRAMUSCULAR | Status: DC | PRN

## 2017-02-07 MED ORDER — FUROSEMIDE 10 MG/ML IJ SOLN
40.0000 mg | Freq: Two times a day (BID) | INTRAMUSCULAR | Status: DC
  Administered 2017-02-08 – 2017-02-09 (×3): 40 mg via INTRAVENOUS
  Filled 2017-02-07 (×3): qty 4

## 2017-02-07 NOTE — Progress Notes (Signed)
Pharmacy Antibiotic Note  Wanda Davenport is a 83 y.o. female admitted on 02/07/2017 with pneumonia.  Pharmacy has been consulted for vancomycin dosing.  Plan: Vancomycin 1250mg  IV every 48 hours.  Goal trough 15-20 mcg/mL. cefepime 2gm iv q24h  Vancomycin trough 2/10@0030    Height: 5\' 2"  (157.5 cm) Weight: 156 lb (70.8 kg) IBW/kg (Calculated) : 50.1  Temp (24hrs), Avg:97.7 F (36.5 C), Min:97.7 F (36.5 C), Max:97.7 F (36.5 C)  Recent Labs  Lab 02/07/17 1500 02/07/17 1603  WBC 7.0  --   CREATININE  --  1.43*    Estimated Creatinine Clearance: 23.1 mL/min (A) (by C-G formula based on SCr of 1.43 mg/dL (H)).    Allergies  Allergen Reactions  . Fenofibrate Other (See Comments)    Myalgias/gi upset   . Statins Other (See Comments)    Myalgias   . Sulfonamide Derivatives Nausea And Vomiting    REACTION: nausea    Antimicrobials this admission: Anti-infectives (From admission, onward)   Start     Dose/Rate Route Frequency Ordered Stop   02/08/17 0100  vancomycin (VANCOCIN) 1,250 mg in sodium chloride 0.9 % 250 mL IVPB     1,250 mg 166.7 mL/hr over 90 Minutes Intravenous Every 48 hours 02/07/17 1827     02/07/17 1715  ceFEPIme (MAXIPIME) 2 g in dextrose 5 % 50 mL IVPB     2 g 100 mL/hr over 30 Minutes Intravenous  Once 02/07/17 1706     02/07/17 1715  vancomycin (VANCOCIN) IVPB 1000 mg/200 mL premix     1,000 mg 200 mL/hr over 60 Minutes Intravenous  Once 02/07/17 1706        Microbiology results: No results found for this or any previous visit (from the past 240 hour(s)).  Thank you for allowing pharmacy to be a part of this patient's care.  Donna Christen Kaisyn Reinhold 02/07/2017 6:27 PM

## 2017-02-07 NOTE — ED Notes (Signed)
Lab at bedside to draw re-collect light green tube for CMP and Troponin.

## 2017-02-07 NOTE — ED Notes (Signed)
This RN attempted x2 to attempt IV access. Anda Kraft, RN attempted x2. Georgina Peer, RN attempted x1. Kirke Shaggy, RN attempted x2. IV team consult placed.

## 2017-02-07 NOTE — Consult Note (Signed)
PULMONARY / CRITICAL CARE MEDICINE   Name: Wanda Davenport MRN: [id number] DOB: December 04, 1923    ADMISSION DATE:  02/07/2017 CONSULTATION DATE:  02/07/17  REFERRING MD:  Dr. Bridgett Larsson  CHIEF COMPLAINT:  Shortness of breath  HISTORY OF PRESENT ILLNESS:   68F with PMH as below most significant for HFrEF (EF 35-40%) who presents with acute on chronic shortness of breath progressing over the past week. CXR with large bilateral pleural effusions, pulmonary vascular congestion, and ? underlying infiltrate. BNP at 1800. Of note pt was recently discharged on 1/21 with similar presentation.   SIGNIFICANT EVENTS: 2/7 admitted to ICU on NIV   PAST MEDICAL HISTORY :  She  has a past medical history of Allergic rhinitis, Anxiety, Arthritis, Atypical chest pain, Bronchitis (01/14/2016), Chronic diastolic CHF (congestive heart failure) ([place]), Coronary artery disease, DIVERTICULOSIS, COLON, HEMORRHOIDS, INTERNAL, Hepatic steatosis, Hyperlipidemia, HYPERTENSION, LBBB (left bundle branch block), Osteopenia, and Pre-diabetes.  PAST SURGICAL HISTORY: She  has a past surgical history that includes Breast surgery; Tonsillectomy; Abdominal hysterectomy; Colonoscopy; Cataract extraction w/PHACO (Left, 05/12/2014); LEFT HEART CATH AND CORONARY ANGIOGRAPHY (N/A, 04/26/2016); CORONARY STENT INTERVENTION (N/A, 04/26/2016); and LEFT HEART CATH AND CORONARY ANGIOGRAPHY (N/A, 05/05/2016).  Allergies  Allergen Reactions  . Fenofibrate Other (See Comments)    Myalgias/gi upset   . Statins Other (See Comments)    Myalgias   . Sulfonamide Derivatives Nausea And Vomiting    REACTION: nausea    No current facility-administered medications on file prior to encounter.    Current Outpatient Medications on File Prior to Encounter  Medication Sig  . albuterol (PROVENTIL HFA;VENTOLIN HFA) 108 (90 BASE) MCG/ACT inhaler Inhale 2 puffs into the lungs every 6 (six) hours as needed. Wheezing or shortness of breath  . apixaban (ELIQUIS) 5 MG  TABS tablet Take 1 tablet (5 mg total) by mouth 2 (two) times daily.  Marland Kitchen aspirin 81 MG tablet Take by mouth daily.   . bisoprolol (ZEBETA) 10 MG tablet Take 5 mg by mouth daily.  . cephALEXin (KEFLEX) 500 MG capsule Take 500 mg by mouth 4 (four) times daily. For seven days  . clonazepam (KLONOPIN) 0.125 MG disintegrating tablet Take 1 tablet (0.125 mg total) by mouth 2 (two) times daily as needed (anxiety).  Marland Kitchen docusate sodium (COLACE) 100 MG capsule Take 1 capsule (100 mg total) by mouth 2 (two) times daily as needed for mild constipation.  Marland Kitchen escitalopram (LEXAPRO) 10 MG tablet Take 1 tablet (10 mg total) by mouth 2 (two) times daily.  . feeding supplement, ENSURE ENLIVE, (ENSURE ENLIVE) LIQD Take 237 mLs by mouth 3 (three) times daily with meals.  . fluticasone (FLONASE) 50 MCG/ACT nasal spray Place 2 sprays into both nostrils daily. Nasal congestion Rarely used   . ipratropium-albuterol (DUONEB) 0.5-2.5 (3) MG/3ML SOLN Take 3 mLs by nebulization 2 (two) times daily.  . montelukast (SINGULAIR) 10 MG tablet Take 1 tablet (10 mg total) by mouth at bedtime.  . nitroGLYCERIN (NITROSTAT) 0.4 MG SL tablet Place under the tongue every 5 (five) minutes as needed for chest pain.   . polyethylene glycol (MIRALAX / GLYCOLAX) packet Take 17 g by mouth daily as needed for mild constipation.  . predniSONE (DELTASONE) 20 MG tablet Take 20 mg by mouth as directed. Taper directions: 60mg  daily for 5 days, 40mg  daily for 5 days then 20mg  daily for 5 days  . Skin Protectants, Misc. (CALAZIME SKIN PROTECTANT EX) Apply 1 application topically as needed.  . torsemide (DEMADEX) 20 MG tablet Take 1  tablet (20 mg total) by mouth 2 (two) times daily. (Patient taking differently: Take 40 mg by mouth 2 (two) times daily. )    FAMILY HISTORY:  Her indicated that the status of her mother is unknown. She indicated that the status of her father is unknown.   SOCIAL HISTORY: She  reports that she quit smoking about 46 years  ago. Her smoking use included cigarettes. She has a 5.00 pack-year smoking history. she has never used smokeless tobacco. She reports that she does not drink alcohol or use drugs.  REVIEW OF SYSTEMS:   Not performed 2/2 pt confusion  SUBJECTIVE:    VITAL SIGNS: BP 127/84 (BP Location: Left Arm)   Pulse 87   Temp 97.8 F (36.6 C) (Axillary)   Resp 20   Ht 5\' 4"  (1.626 m)   Wt 72.1 kg (158 lb 15.2 oz)   SpO2 100%   BMI 27.28 kg/m   HEMODYNAMICS:    VENTILATOR SETTINGS: FiO2 (%):  [28 %] 28 %  INTAKE / OUTPUT: No intake/output data recorded.  PHYSICAL EXAMINATION: General: ill-appearing Neuro:  Grossly non-focal HEENT:  NCAT PERRL Cardiovascular:  S1S2, RRR, no m/g/r, 1+ peripheral pulses, 2+ LE edema bilaterally Lungs:  Diminished bilaterally Abdomen:  SNT non-distended Musculoskeletal:  No gross deformity Skin:  Warm dry acyanotic  LABS:  BMET Recent Labs  Lab 02/07/17 1603  NA 137  K 3.6  CL 100*  CO2 25  BUN 45*  CREATININE 1.43*  GLUCOSE 141*    Electrolytes Recent Labs  Lab 02/07/17 1603  CALCIUM 10.0    CBC Recent Labs  Lab 02/07/17 1500  WBC 7.0  HGB 12.6  HCT 41.3  PLT 234    Coag's No results for input(s): APTT, INR in the last 168 hours.  Sepsis Markers No results for input(s): LATICACIDVEN, PROCALCITON, O2SATVEN in the last 168 hours.  ABG No results for input(s): PHART, PCO2ART, PO2ART in the last 168 hours.  Liver Enzymes Recent Labs  Lab 02/07/17 1603  AST 30  ALT 17  ALKPHOS 92  BILITOT 1.1  ALBUMIN 3.7    Cardiac Enzymes Recent Labs  Lab 02/07/17 1502  TROPONINI 0.03*    Glucose Recent Labs  Lab 02/07/17 1852  GLUCAP 144*    Imaging Dg Chest 2 View  Result Date: 02/07/2017 CLINICAL DATA:  Shortness of Breath EXAM: CHEST  2 VIEW COMPARISON:  January 22, 2017 FINDINGS: There are sizable pleural effusions bilaterally, increased on the right and stable on the left. There is underlying consolidation in  portions of the left lower lobe. There is probable compressive atelectasis in the right middle and lower lobe regions. There is cardiomegaly with pulmonary venous hypertension. No adenopathy. There is aortic atherosclerosis. There is a coronary stent in the left anterior descending coronary artery region. No evident bone lesions. IMPRESSION: Bilateral pleural effusions, increased on the right and stable on the left. There is underlying pulmonary vascular congestion. There may well be a degree of underlying congestive heart failure. Probable compressive atelectasis bilaterally. A degree of superimposed pneumonia in the left base cannot be excluded. Aortic atherosclerosis. Left anterior descending coronary artery stent present. Aortic Atherosclerosis (ICD10-I70.0). Electronically Signed   By: Lowella Grip III M.D.   On: 02/07/2017 14:31   STUDIES:  2/7 CXR with bilateral pleural effusions, cardiomegaly, and possible underlying infiltrate  ANTIBIOTICS: Vancomycin 2/7>> Cefepime 2/7>>  LINES/TUBES: PIV  DISCUSSION: 66F with decompensated HF, large bilateral pleural effusions and ? infiltrate. Will check PCT. Pt  has undergone thoracentesis twice in January with these effusions. At this point, long-term prognosis is very poor and she may benefit more from palliative care than aggressive medical treatment.   ASSESSMENT / PLAN: Acute hypoxic respiratory failure 2/2 acutely decompensated HFrEF AKI likely 2/2 cardiorenal syndrome ? Pulmonary infiltrates, concern for HCAP H/O coronary artery disease Continue NIV PRN, wean as able Supplemental O2 PRN Monitor I&O Diuresis Continue vanc/cefepime for now Will check PCT and deescalate abx as able Continue PRN NTG Plan for transfer to floor tomorrow if stable off of NIV   Audery Amel, PA-S 02/07/2017, 7:38 PM

## 2017-02-07 NOTE — H&P (Signed)
[facility] NAME: Wanda Davenport    MR#:  [id number]  DATE OF BIRTH:  21-Aug-1923  DATE OF ADMISSION:  02/07/2017  PRIMARY CARE PHYSICIAN: Mellody Dance, DO   REQUESTING/REFERRING PHYSICIAN: Merlyn Lot, MD  CHIEF COMPLAINT:   Chief Complaint  Patient presents with  . Shortness of Breath   Worsening shortness of breath for 3-4 days. HISTORY OF PRESENT ILLNESS:  Wanda Davenport  is a 84 y.o. female with a known history of multiple medical problems as below.  The patient was sent from nursing home to the ED due to above chief complaints.  The patient is confused, on BiPAP.  According to her son and daughter-in-law, the patient has had worsening shortness of breath for the past 3-4 days.  She was just discharged from the hospital 1 week ago.  She has chronic respiratory failure and is put on BiPAP due to hypoxia.  Chest x-ray show CHF and possible pneumonia.  The patient is confused for her daughter-in-law. PAST MEDICAL HISTORY:   Past Medical History:  Diagnosis Date  . Allergic rhinitis   . Anxiety   . Arthritis   . Atypical chest pain   . Bronchitis 01/14/2016  . Chronic diastolic CHF (congestive heart failure) ([facility])    Echo 06/2010 EF 60-65%, no RWMAs, grade 1 diastolic dysfunction, mild LAE, PASP 56mmHg.  Marland Kitchen Coronary artery disease    MI  . DIVERTICULOSIS, COLON   . HEMORRHOIDS, INTERNAL   . Hepatic steatosis   . Hyperlipidemia   . HYPERTENSION   . LBBB (left bundle branch block)    a. present on ECG 06/2010  . Osteopenia   . Pre-diabetes    A1c 6.0    PAST SURGICAL HISTORY:   Past Surgical History:  Procedure Laterality Date  . ABDOMINAL HYSTERECTOMY    . BREAST SURGERY    . CATARACT EXTRACTION W/PHACO Left 05/12/2014   Procedure: CATARACT EXTRACTION PHACO AND INTRAOCULAR LENS PLACEMENT (IOC);  Surgeon: Leandrew Koyanagi, MD;  Location: [facility];  Service: Ophthalmology;  Laterality: Left;  .  COLONOSCOPY     a. 2010  . CORONARY STENT INTERVENTION N/A 04/26/2016   Procedure: Coronary Stent Intervention;  Surgeon: Isaias Cowman, MD;  Location: [facility];  Service: Cardiovascular;  Laterality: N/A;  . LEFT HEART CATH AND CORONARY ANGIOGRAPHY N/A 04/26/2016   Procedure: Left Heart Cath and Coronary Angiography;  Surgeon: Isaias Cowman, MD;  Location: [facility];  Service: Cardiovascular;  Laterality: N/A;  . LEFT HEART CATH AND CORONARY ANGIOGRAPHY N/A 05/05/2016   Procedure: Left Heart Cath and Coronary Angiography;  Surgeon: Wellington Hampshire, MD;  Location: [facility];  Service: Cardiovascular;  Laterality: N/A;  . TONSILLECTOMY      SOCIAL HISTORY:   Social History   Tobacco Use  . Smoking status: Former Smoker    Packs/day: 0.25    Years: 20.00    Pack years: 5.00    Types: Cigarettes    Last attempt to quit: 01/02/1971    Years since quitting: 46.1  . Smokeless tobacco: Never Used  . Tobacco comment: smoked a few cigarettes/day x 20 yrs - quit @ age 36.  Substance Use Topics  . Alcohol use: No    FAMILY HISTORY:   Family History  Problem Relation Age of Onset  . Colon cancer Sister   . Melanoma Sister   . Hyperlipidemia Brother   . Hypertension Mother   .  Lupus Mother        died 68 from surgical complications  . Alzheimer's disease Brother        unknown  . Kidney disease Father        died 39  . Hypertension Father   . Alcohol abuse Father     DRUG ALLERGIES:   Allergies  Allergen Reactions  . Fenofibrate Other (See Comments)    Myalgias/gi upset   . Statins Other (See Comments)    Myalgias   . Sulfonamide Derivatives Nausea And Vomiting    REACTION: nausea    REVIEW OF SYSTEMS:   Review of Systems  Unable to perform ROS: Mental status change    MEDICATIONS AT HOME:   Prior to Admission medications   Medication Sig Start Date End Date Taking? Authorizing Provider  albuterol (PROVENTIL  HFA;VENTOLIN HFA) 108 (90 BASE) MCG/ACT inhaler Inhale 2 puffs into the lungs every 6 (six) hours as needed. Wheezing or shortness of breath   Yes [provider]  apixaban (ELIQUIS) 5 MG TABS tablet Take 1 tablet (5 mg total) by mouth 2 (two) times daily. 01/24/17  Yes Vaughan Basta, MD  aspirin 81 MG tablet Take by mouth daily.    Yes [provider]  bisoprolol (ZEBETA) 10 MG tablet Take 5 mg by mouth daily.   Yes [provider]  cephALEXin (KEFLEX) 500 MG capsule Take 500 mg by mouth 4 (four) times daily. For seven days 02/04/17 02/11/17 Yes [provider]  clonazepam (KLONOPIN) 0.125 MG disintegrating tablet Take 1 tablet (0.125 mg total) by mouth 2 (two) times daily as needed (anxiety). 01/17/17  Yes Henreitta Leber, MD  docusate sodium (COLACE) 100 MG capsule Take 1 capsule (100 mg total) by mouth 2 (two) times daily as needed for mild constipation. 01/24/17  Yes Vaughan Basta, MD  escitalopram (LEXAPRO) 10 MG tablet Take 1 tablet (10 mg total) by mouth 2 (two) times daily. 10/15/16  Yes Opalski, Deborah, DO  feeding supplement, ENSURE ENLIVE, (ENSURE ENLIVE) LIQD Take 237 mLs by mouth 3 (three) times daily with meals. 01/24/17  Yes Vaughan Basta, MD  fluticasone (FLONASE) 50 MCG/ACT nasal spray Place 2 sprays into both nostrils daily. Nasal congestion Rarely used    Yes [provider]  ipratropium-albuterol (DUONEB) 0.5-2.5 (3) MG/3ML SOLN Take 3 mLs by nebulization 2 (two) times daily.   Yes [provider]  montelukast (SINGULAIR) 10 MG tablet Take 1 tablet (10 mg total) by mouth at bedtime. 08/14/16  Yes Danford, Valetta Fuller D, NP  nitroGLYCERIN (NITROSTAT) 0.4 MG SL tablet Place under the tongue every 5 (five) minutes as needed for chest pain.    Yes [provider]  polyethylene glycol (MIRALAX / GLYCOLAX) packet Take 17 g by mouth daily as needed for mild constipation. 01/17/17  Yes Sainani, Belia Heman, MD    predniSONE (DELTASONE) 20 MG tablet Take 20 mg by mouth as directed. Taper directions: 60mg  daily for 5 days, 40mg  daily for 5 days then 20mg  daily for 5 days 02/07/17 02/22/17 Yes [provider]  Skin Protectants, Misc. (CALAZIME SKIN PROTECTANT EX) Apply 1 application topically as needed.   Yes [provider]  torsemide (DEMADEX) 20 MG tablet Take 1 tablet (20 mg total) by mouth 2 (two) times daily. Patient taking differently: Take 40 mg by mouth 2 (two) times daily.  01/25/17  Yes Vaughan Basta, MD      VITAL SIGNS:  Blood pressure 122/66, pulse 64, temperature 97.7 F (36.5  C), temperature source Oral, resp. rate 14, height 5\' 2"  (1.575 m), weight 156 lb (70.8 kg), SpO2 99 %.  PHYSICAL EXAMINATION:  Physical Exam  GENERAL:  84 y.o.-year-old patient lying in the bed, on BiPAP.  EYES: Pupils equal, round, reactive to light and accommodation. No scleral icterus. Extraocular muscles intact.  HEENT: Head atraumatic, normocephalic. Oropharynx and nasopharynx clear.  NECK:  Supple, no jugular venous distention. No thyroid enlargement, no tenderness.  LUNGS: Normal breath sounds bilaterally, no wheezing, has rales. No use of accessory muscles of respiration.  CARDIOVASCULAR: S1, S2 normal. No murmurs, rubs, or gallops.  ABDOMEN: Soft, nontender, nondistended. Bowel sounds present. No organomegaly or mass.  EXTREMITIES: No cyanosis, or clubbing.  Bilateral lower extremity edema 2+. NEUROLOGIC: Unable to exam.  PSYCHIATRIC: The patient is awake but confused. SKIN: No obvious rash, lesion, or ulcer.   LABORATORY PANEL:   CBC Recent Labs  Lab 02/07/17 1500  WBC 7.0  HGB 12.6  HCT 41.3  PLT 234   ------------------------------------------------------------------------------------------------------------------  Chemistries  Recent Labs  Lab 02/07/17 1603  NA 137  K 3.6  CL 100*  CO2 25  GLUCOSE 141*  BUN 45*  CREATININE 1.43*  CALCIUM 10.0  AST 30   ALT 17  ALKPHOS 92  BILITOT 1.1   ------------------------------------------------------------------------------------------------------------------  Cardiac Enzymes Recent Labs  Lab 02/07/17 1502  TROPONINI 0.03*   ------------------------------------------------------------------------------------------------------------------  RADIOLOGY:  Dg Chest 2 View  Result Date: 02/07/2017 CLINICAL DATA:  Shortness of Breath EXAM: CHEST  2 VIEW COMPARISON:  January 22, 2017 FINDINGS: There are sizable pleural effusions bilaterally, increased on the right and stable on the left. There is underlying consolidation in portions of the left lower lobe. There is probable compressive atelectasis in the right middle and lower lobe regions. There is cardiomegaly with pulmonary venous hypertension. No adenopathy. There is aortic atherosclerosis. There is a coronary stent in the left anterior descending coronary artery region. No evident bone lesions. IMPRESSION: Bilateral pleural effusions, increased on the right and stable on the left. There is underlying pulmonary vascular congestion. There may well be a degree of underlying congestive heart failure. Probable compressive atelectasis bilaterally. A degree of superimposed pneumonia in the left base cannot be excluded. Aortic atherosclerosis. Left anterior descending coronary artery stent present. Aortic Atherosclerosis (ICD10-I70.0). Electronically Signed   By: Lowella Grip III M.D.   On: 02/07/2017 14:31      IMPRESSION AND PLAN:   Acute on chronic respiratory failure with hypoxia due to CHF and possible pneumonia. The patient will be admitted to stepdown unit. Continue BiPAP, NEB and intensivist consult.  Acute on chronic systolic CHF. Start Lasix IV twice daily, CHF protocol.  Acute renal failure on CKD stage III.  Follow-up BMP while on Lasix.  Poor prognosis, palliative care consult. All the records are reviewed and case discussed with ED  provider. Management plans discussed with the patient's son and daughter-in-law and they are in agreement.  CODE STATUS: DNR  TOTAL CRITICAL TIME TAKING CARE OF THIS PATIENT: 58 minutes.    Demetrios Loll M.D on 02/07/2017 at 6:11 PM  Between 7am to 6pm - Pager - [phone]  After 6pm go to [email or web address] - Proofreader  [facility]  Office  [phone]  CC: Primary care physician; Mellody Dance, DO   Note: This dictation was prepared with Dragon dictation along with smaller phrase technology. Any transcriptional errors that result from this process are unin

## 2017-02-07 NOTE — ED Notes (Signed)
Lab at bedside

## 2017-02-07 NOTE — ED Notes (Signed)
IV team at bedside. Aware that we need 2 IVs d/t patient going to ICU.  Unable to start antibiotics d/t not having IV access.

## 2017-02-07 NOTE — ED Triage Notes (Addendum)
Pt arrives via [facility] from [facility] with c/o increased shortness of breath x 2-3 days. Pt on 2L O2 at home all the time. Pt has been in and out of hospital for past several weeks with pneumonia.   Pt dyspnic upon exertion. Rattling breathing audible. Left lung sounds diminished with some coarse crackles. Right lung sounds clear upon triage. Pt has bilateral lower leg 2+ pitting edema.   Hx CHF.

## 2017-02-07 NOTE — ED Notes (Signed)
Antibiotics delayed d/t inability to gain IV access. This RN could not draw back on the 22g placed by IV team in left hand, so as to not delay antibiotic administration, no blood cultures drawn.

## 2017-02-07 NOTE — ED Notes (Signed)
RT at bedside.

## 2017-02-07 NOTE — ED Provider Notes (Signed)
[facility] Emergency Department Provider Note    First MD Initiated Contact with Patient 02/07/17 1314     (approximate)  I have reviewed the triage vital signs and the nursing notes.   HISTORY  Chief Complaint Shortness of Breath    HPI Wanda Davenport is a 84 y.o. female significant underlying congestive heart failure as well as COPD with recent admission the hospital for decompensated acute on chronic systolic heart failure status post thoracentesis Ri presents to the ER with chief complaint of worsening shortness of breath the past 2-3 days.  Patient does wear 2 L nasal cannula at home but is having significant difficulty speaking due to increased work of breathing.  Denies any fevers.  No productive cough.  Denies any pain.  No abdominal pain nausea or vomiting.  Past Medical History:  Diagnosis Date  . Allergic rhinitis   . Anxiety   . Arthritis   . Atypical chest pain   . Bronchitis 01/14/2016  . Chronic diastolic CHF (congestive heart failure) (HCC)    Echo 06/2010 EF 60-65%, no RWMAs, grade 1 diastolic dysfunction, mild LAE, PASP 72mmHg.  Marland Kitchen Coronary artery disease    MI  . DIVERTICULOSIS, COLON   . HEMORRHOIDS, INTERNAL   . Hepatic steatosis   . Hyperlipidemia   . HYPERTENSION   . LBBB (left bundle branch block)    a. present on ECG 06/2010  . Osteopenia   . Pre-diabetes    A1c 6.0   Family History  Problem Relation Age of Onset  . Colon cancer Sister   . Melanoma Sister   . Hyperlipidemia Brother   . Hypertension Mother   . Lupus Mother        died 82 from surgical complications  . Alzheimer's disease Brother        unknown  . Kidney disease Father        died 66  . Hypertension Father   . Alcohol abuse Father    Past Surgical History:  Procedure Laterality Date  . ABDOMINAL HYSTERECTOMY    . BREAST SURGERY    . CATARACT EXTRACTION W/PHACO Left 05/12/2014   Procedure: CATARACT EXTRACTION PHACO AND INTRAOCULAR LENS PLACEMENT  (IOC);  Surgeon: Leandrew Koyanagi, MD;  Location: [facility];  Service: Ophthalmology;  Laterality: Left;  . COLONOSCOPY     a. 2010  . CORONARY STENT INTERVENTION N/A 04/26/2016   Procedure: Coronary Stent Intervention;  Surgeon: Isaias Cowman, MD;  Location: [place] [facility];  Service: Cardiovascular;  Laterality: N/A;  . LEFT HEART CATH AND CORONARY ANGIOGRAPHY N/A 04/26/2016   Procedure: Left Heart Cath and Coronary Angiography;  Surgeon: Isaias Cowman, MD;  Location: [place] [facility];  Service: Cardiovascular;  Laterality: N/A;  . LEFT HEART CATH AND CORONARY ANGIOGRAPHY N/A 05/05/2016   Procedure: Left Heart Cath and Coronary Angiography;  Surgeon: Wellington Hampshire, MD;  Location: [place] [facility];  Service: Cardiovascular;  Laterality: N/A;  . TONSILLECTOMY     Patient Active Problem List   Diagnosis Date Noted  . Hypotension 02/05/2017  . Lymphedema 02/05/2017  . Malnutrition of moderate degree 01/22/2017  . Acute on chronic respiratory failure with hypoxia ([place]) 01/21/2017  . Acute on chronic systolic CHF (congestive heart failure) ([place]) 01/12/2017  . Bilateral leg edema 09/05/2016  . Sinusitis, acute maxillary 07/12/2016  . Pleural effusion, left 07/12/2016  . Chronic systolic heart failure ([place]) 06/15/2016  . PE (pulmonary thromboembolism) ([place]) 05/19/2016  . Major depression  in remission ([place]) 05/07/2016  . Pneumonia, community acquired 05/07/2016  . Unstable angina ([place])   . Coronary artery disease of native artery of native heart with stable angina pectoris ([place]) 05/01/2016  . STEMI (ST elevation myocardial infarction) ([place]) 04/26/2016  . Knee pain, bilateral 04/10/2016  . Medication monitoring encounter 01/26/2016  . Intolerance of drug- statins 01/25/2016  . Counseling regarding end of life decision making 01/25/2016  . Hypertriglyceridemia 12/13/2015  . Low serum HDL 12/13/2015  . B12 deficiency 12/13/2015  . Claudication of left  lower extremity ([place]) 12/13/2015  . Vitamin D deficiency 11/06/2015  . Adjustment disorder with mixed anxiety and depressed mood 11/06/2015  . Basal cell carcinoma 10/31/2015  . Environmental and seasonal allergies 10/31/2015  . Reactive airway disease- only spring and fall due to allergies 10/31/2015  . GAD (generalized anxiety disorder) 10/13/2015  . Atypical chest pain 04/16/2011  . Hyperlipidemia   . Pre-diabetes   . chronic LBBB (left bundle branch block)- since 2012   . Generalized OA   . Allergic rhinitis   . DIVERTICULOSIS, COLON 12/03/2007  . HTN (hypertension) 04/26/2007      Prior to Admission medications   Medication Sig Start Date End Date Taking? Authorizing Provider  albuterol (PROVENTIL HFA;VENTOLIN HFA) 108 (90 BASE) MCG/ACT inhaler Inhale 2 puffs into the lungs every 6 (six) hours as needed. Wheezing or shortness of breath   Yes [provider]  apixaban (ELIQUIS) 5 MG TABS tablet Take 1 tablet (5 mg total) by mouth 2 (two) times daily. 01/24/17  Yes Vaughan Basta, MD  aspirin 81 MG tablet Take by mouth daily.    Yes [provider]  bisoprolol (ZEBETA) 10 MG tablet Take 5 mg by mouth daily.   Yes [provider]  cephALEXin (KEFLEX) 500 MG capsule Take 500 mg by mouth 4 (four) times daily. For seven days 02/04/17 02/11/17 Yes [provider]  clonazepam (KLONOPIN) 0.125 MG disintegrating tablet Take 1 tablet (0.125 mg total) by mouth 2 (two) times daily as needed (anxiety). 01/17/17  Yes Henreitta Leber, MD  docusate sodium (COLACE) 100 MG capsule Take 1 capsule (100 mg total) by mouth 2 (two) times daily as needed for mild constipation. 01/24/17  Yes Vaughan Basta, MD  escitalopram (LEXAPRO) 10 MG tablet Take 1 tablet (10 mg total) by mouth 2 (two) times daily. 10/15/16  Yes Opalski, Deborah, DO  feeding supplement, ENSURE ENLIVE, (ENSURE ENLIVE) LIQD Take 237 mLs by mouth 3 (three) times daily with meals. 01/24/17  Yes  Vaughan Basta, MD  fluticasone (FLONASE) 50 MCG/ACT nasal spray Place 2 sprays into both nostrils daily. Nasal congestion Rarely used    Yes [provider]  ipratropium-albuterol (DUONEB) 0.5-2.5 (3) MG/3ML SOLN Take 3 mLs by nebulization 2 (two) times daily.   Yes [provider]  montelukast (SINGULAIR) 10 MG tablet Take 1 tablet (10 mg total) by mouth at bedtime. 08/14/16  Yes Danford, Valetta Fuller D, NP  nitroGLYCERIN (NITROSTAT) 0.4 MG SL tablet Place under the tongue every 5 (five) minutes as needed for chest pain.    Yes [provider]  polyethylene glycol (MIRALAX / GLYCOLAX) packet Take 17 g by mouth daily as needed for mild constipation. 01/17/17  Yes Sainani, Belia Heman, MD  predniSONE (DELTASONE) 20 MG tablet Take 20 mg by mouth as directed. Taper directions: 60mg  daily for 5 days, 40mg  daily for 5 days then 20mg  daily for 5 days 02/07/17 02/22/17 Yes [provider]  Skin Protectants, Misc. (CALAZIME  SKIN PROTECTANT EX) Apply 1 application topically as needed.   Yes [provider]  torsemide (DEMADEX) 20 MG tablet Take 1 tablet (20 mg total) by mouth 2 (two) times daily. Patient taking differently: Take 40 mg by mouth 2 (two) times daily.  01/25/17  Yes Vaughan Basta, MD    Allergies Fenofibrate; Statins; and Sulfonamide derivatives    Social History Social History   Tobacco Use  . Smoking status: Former Smoker    Packs/day: 0.25    Years: 20.00    Pack years: 5.00    Types: Cigarettes    Last attempt to quit: 01/02/1971    Years since quitting: 46.1  . Smokeless tobacco: Never Used  . Tobacco comment: smoked a few cigarettes/day x 20 yrs - quit @ age 64.  Substance Use Topics  . Alcohol use: No  . Drug use: No    Review of Systems Patient denies headaches, rhinorrhea, blurry vision, numbness, shortness of breath, chest pain, edema, cough, abdominal pain, nausea, vomiting, diarrhea, dysuria, fevers, rashes or  hallucinations unless otherwise stated above in HPI. ____________________________________________   PHYSICAL EXAM:  VITAL SIGNS: Vitals:   02/07/17 1800 02/07/17 1852  BP: 117/67 127/84  Pulse: 73 87  Resp: 18 20  Temp:  97.8 F (36.6 C)  SpO2: 100% 100%    Constitutional: Alert and oriented. ill appearing , in moderate resp distress. Eyes: Conjunctivae are normal.  Head: Atraumatic. Nose: No congestion/rhinnorhea. Mouth/Throat: Mucous membranes are moist.   Neck: No stridor. Painless ROM.  Cardiovascular: Normal rate, regular rhythm. Grossly normal heart sounds.  Good peripheral circulation. Respiratory: Tachypnea, speaking short phrases, use of accessory muscles with diminished breath sounds on left side with a history of Tory crackles in anterior lung fields bilaterally Gastrointestinal: Soft and nontender. No distention. No abdominal bruits. No CVA tenderness. Genitourinary:  Musculoskeletal: No lower extremity tenderness nor edema.  No joint effusions. Neurologic:  Normal speech and language. No gross focal neurologic deficits are appreciated. No facial droop Skin:  Skin is warm, dry and intact. No rash noted. Psychiatric: Mood and affect are normal. Speech and behavior are normal.  ____________________________________________   LABS (all labs ordered are listed, but only abnormal results are displayed)  Results for orders placed or performed during the hospital encounter of 02/07/17 (from the past 24 hour(s))  Blood gas, venous     Status: Abnormal   Collection Time: 02/07/17  1:17 PM  Result Value Ref Range   pH, Ven 7.32 7.250 - 7.430   pCO2, Ven 57 44.0 - 60.0 mmHg   pO2, Ven 34.0 32.0 - 45.0 mmHg   Bicarbonate 29.4 (H) 20.0 - 28.0 mmol/L   Acid-Base Excess 2.1 (H) 0.0 - 2.0 mmol/L   O2 Saturation 59.6 %   Patient temperature 37.0    Collection site VENOUS    Sample type VENOUS   CBC with Differential/Platelet     Status: Abnormal   Collection Time:  02/07/17  3:00 PM  Result Value Ref Range   WBC 7.0 3.6 - 11.0 K/uL   RBC 5.75 (H) 3.80 - 5.20 MIL/uL   Hemoglobin 12.6 12.0 - 16.0 g/dL   HCT 41.3 35.0 - 47.0 %   MCV 71.7 (L) 80.0 - 100.0 fL   MCH 22.0 (L) 26.0 - 34.0 pg   MCHC 30.6 (L) 32.0 - 36.0 g/dL   RDW 23.1 (H) 11.5 - 14.5 %   Platelets 234 150 - 440 K/uL   Neutrophils Relative % 93 %  Neutro Abs 6.6 (H) 1.4 - 6.5 K/uL   Lymphocytes Relative 5 %   Lymphs Abs 0.3 (L) 1.0 - 3.6 K/uL   Monocytes Relative 2 %   Monocytes Absolute 0.1 (L) 0.2 - 0.9 K/uL   Eosinophils Relative 0 %   Eosinophils Absolute 0.0 0 - 0.7 K/uL   Basophils Relative 0 %   Basophils Absolute 0.0 0 - 0.1 K/uL  Brain natriuretic peptide     Status: Abnormal   Collection Time: 02/07/17  3:02 PM  Result Value Ref Range   B Natriuretic Peptide 2,643.0 (H) 0.0 - 100.0 pg/mL  Troponin I     Status: Abnormal   Collection Time: 02/07/17  3:02 PM  Result Value Ref Range   Troponin I 0.03 (HH) <0.03 ng/mL  Comprehensive metabolic panel     Status: Abnormal   Collection Time: 02/07/17  4:03 PM  Result Value Ref Range   Sodium 137 135 - 145 mmol/L   Potassium 3.6 3.5 - 5.1 mmol/L   Chloride 100 (L) 101 - 111 mmol/L   CO2 25 22 - 32 mmol/L   Glucose, Bld 141 (H) 65 - 99 mg/dL   BUN 45 (H) 6 - 20 mg/dL   Creatinine, Ser 1.43 (H) 0.44 - 1.00 mg/dL   Calcium 10.0 8.9 - 10.3 mg/dL   Total Protein 6.7 6.5 - 8.1 g/dL   Albumin 3.7 3.5 - 5.0 g/dL   AST 30 15 - 41 U/L   ALT 17 14 - 54 U/L   Alkaline Phosphatase 92 38 - 126 U/L   Total Bilirubin 1.1 0.3 - 1.2 mg/dL   GFR calc non Af Amer 31 (L) >60 mL/min   GFR calc Af Amer 36 (L) >60 mL/min   Anion gap 12 5 - 15  Glucose, capillary     Status: Abnormal   Collection Time: 02/07/17  6:52 PM  Result Value Ref Range   Glucose-Capillary 144 (H) 65 - 99 mg/dL   ____________________________________________  EKG My review and personal interpretation at Time: 13:17   Indication: sob  Rate: 65  Rhythm: sinus  Axis: left Other: IVCD, no stemi, nonspecific st and t wave changes ____________________________________________  RADIOLOGY  I personally reviewed all radiographic images ordered to evaluate for the above acute complaints and reviewed radiology reports and findings.  These findings were personally discussed with the patient.  Please see medical record for radiology report.  ____________________________________________   PROCEDURES  Procedure(s) performed:  .Critical Care Performed by: Merlyn Lot, MD Authorized by: Merlyn Lot, MD   Critical care provider statement:    Critical care time (minutes):  45   Critical care time was exclusive of:  Separately billable procedures and treating other patients   Critical care was time spent personally by me on the following activities:  Development of treatment plan with patient or surrogate, discussions with consultants, evaluation of patient's response to treatment, examination of patient, obtaining history from patient or surrogate, ordering and performing treatments and interventions, ordering and review of laboratory studies, ordering and review of radiographic studies, pulse oximetry, re-evaluation of patient's condition and review of old charts      Critical Care performed: yes ____________________________________________   INITIAL IMPRESSION / ASSESSMENT AND PLAN / ED COURSE  Pertinent labs & imaging results that were available during my care of the patient were reviewed by me and considered in my medical decision making (see chart for details).  DDX: Asthma, copd, CHF, pna, ptx, malignancy, Pe, anemia  Wanda Davenport is a 84 y.o. who presents to the ED with acute respiratory distress and symptoms as described above.  Certainly has a mixed picture with multiple comorbidities.  Possible component of pneumonia.  Breath sounds are diminished on the left therefore I am concern for recurrent pleural effusion.  Seems less  consistent with ACS but could be simply worsening decompensated congestive heart failure.  We will check blood work.  Will place on BiPAP for work of breathing.  Anticipate patient will require admission to the hospital for further medical management.  Patient will be given nebulizer treatment done here in our.  Placed on cardiac monitor.  Blood work sent for the above differential.  Clinical Course as of Feb 07 1902  Thu Feb 07, 2017  1708 Blood work shows appropriate renal function.  Given chest x-ray showing evidence and concern for pneumonia we will treat with broad-spectrum antibiotics given her respiratory failure.  Seems like it is a mixed picture including underlying COPD possible worsening CHF with recurrent pleural effusion concerning pneumonia.  [PR]    Clinical Course User Index [PR] Merlyn Lot, MD     ____________________________________________   FINAL CLINICAL IMPRESSION(S) / ED DIAGNOSES  Final diagnoses:  Pleural effusion  Acute respiratory distress  Acute on chronic systolic congestive heart failure (HCC)  COPD exacerbation (HCC)      NEW MEDICATIONS STARTED DURING THIS VISIT:  Current Discharge Medication List       Note:  This document was prepared using Dragon voice recognition software and may include unintentional dictation errors.    Merlyn Lot, MD 02/07/17 [phone]

## 2017-02-07 NOTE — Patient Outreach (Signed)
[facility]) Care Management  [facility] Social Work  02/07/2017  Wanda Davenport [birth year] [id number]  Subjective:  Patient remains in SNF rehab.   Objective: [facility] CSW to assist patient and family with community based resources to aide in their well-being, quality of life and overall safety and needs.    Current Medications:  Current Outpatient Medications  Medication Sig Dispense Refill  . albuterol (PROVENTIL HFA;VENTOLIN HFA) 108 (90 BASE) MCG/ACT inhaler Inhale 2 puffs into the lungs every 6 (six) hours as needed. Wheezing or shortness of breath    . apixaban (ELIQUIS) 5 MG TABS tablet Take 1 tablet (5 mg total) by mouth 2 (two) times daily. 60 tablet 0  . aspirin 81 MG tablet Take by mouth daily.     Marland Kitchen BISOPROLOL FUMARATE PO Take 5 mg by mouth daily.    . cephALEXin (KEFLEX) 500 MG capsule Take 500 mg by mouth 4 (four) times daily.    . clonazepam (KLONOPIN) 0.125 MG disintegrating tablet Take 1 tablet (0.125 mg total) by mouth 2 (two) times daily as needed (anxiety). 30 tablet 0  . docusate sodium (COLACE) 100 MG capsule Take 1 capsule (100 mg total) by mouth 2 (two) times daily as needed for mild constipation. 10 capsule 0  . escitalopram (LEXAPRO) 10 MG tablet Take 1 tablet (10 mg total) by mouth 2 (two) times daily. 180 tablet 1  . feeding supplement, ENSURE ENLIVE, (ENSURE ENLIVE) LIQD Take 237 mLs by mouth 3 (three) times daily with meals. 237 mL 12  . fluticasone (FLONASE) 50 MCG/ACT nasal spray Place 2 sprays into the nose daily. Nasal congestion Rarely used     . ipratropium-albuterol (DUONEB) 0.5-2.5 (3) MG/3ML SOLN Take 3 mLs by nebulization 2 (two) times daily.    . montelukast (SINGULAIR) 10 MG tablet Take 1 tablet (10 mg total) by mouth at bedtime. 90 tablet 1  . nitroGLYCERIN (NITROSTAT) 0.4 MG SL tablet Place under the tongue every 5 (five) minutes as needed for chest pain.     . polyethylene glycol (MIRALAX / GLYCOLAX) packet Take 17 g by mouth daily as  needed for mild constipation. 14 each 0  . Skin Protectants, Misc. (CALAZIME SKIN PROTECTANT EX) Apply 1 application topically as needed.    . torsemide (DEMADEX) 20 MG tablet Take 1 tablet (20 mg total) by mouth 2 (two) times daily. 60 tablet 0   No current facility-administered medications for this visit.     Functional Status:  In your present state of health, do you have any difficulty performing the following activities: 02/05/2017 01/21/2017  Hearing? Tempie Donning  Vision? N N  Difficulty concentrating or making decisions? Tempie Donning  Walking or climbing stairs? Y Y  Dressing or bathing? Y Y  Doing errands, shopping? [place] and eating ? - -  Using the Toilet? - -  In the past six months, have you accidently leaked urine? - -  Do you have problems with loss of bowel control? - -  Managing your Medications? - -  Comment - -  Managing your Finances? - -  Comment - -  Housekeeping or managing your Housekeeping? - -  Comment - -  Some recent data might be hidden    Fall/Depression Screening:  Fall Risk  02/05/2017 01/21/2017 08/07/2016  Falls in the past year? No Yes No  Number falls in past yr: - 1 -  Injury with Fall? - Yes -  Risk Factor  Category  - High Fall Risk -  Risk for fall due to : - History of fall(s);Impaired balance/gait;Impaired mobility;Mental status change Impaired balance/gait  Risk for fall due to: Comment - - uses walker at times   Follow up - Education provided;Falls prevention discussed -   PHQ 2/9 Scores 02/05/2017 01/21/2017 10/16/2016 08/07/2016 07/18/2016 06/14/2016 05/31/2016  PHQ - 2 Score 0 0 0 1 0 0 0  PHQ- 9 Score - - 4 - - - -    Assessment:  CSW spoke with SNF rep on 02/07/17 as well as daughter in law by phone.  Patient had MD appointment today as well as concerns for fluid pocket buildup again.  Per daughter in law she feels that they are not able to provide the care she will need at home as she lives alone and family cannot provide 24 hour  care.  Per daughter in law, the son is having a hard time thinking about long term placement because "he wants her to be home where she wants to be".  CSW encouraged them to discuss plans further as well as to proceed with Medicaid application in anticipation of the need.  CSW also spoke to SNF rep about the dc plan and she indicates family has reported plans to go home. CSW advised SNF rep of my conversations with family about lack of support/care in the home setting.  CSW will plan a f/u call or visit to SNF next weelk to further assess and assist with plans.   Plan:  [facility] CM Care Plan Problem One     Most Recent Value  Care Plan Problem One  Level of care issues.  Role Documenting the Problem One  Clinical Social Worker  Care Plan for Problem One  Active  [facility] Long Term Goal   Patient will have home health services and durable medical equipment in place at time of discharge from the skilled nursing facility, within the next 40 days.  [facility] Long Term Goal Start Date  01/21/17  Interventions for Problem One Long Term Goal  CSW will assist patient and discharge planning coordinator at the skilled nursing facility with arranging home health services and durable medical equipment.  [facility] CM Short Term Goal #1   Patient will decide on a home health agency of choice, with regards to home care services, within the next three weeks.  [facility] CM Short Term Goal #1 Start Date  01/21/17  Interventions for Short Term Goal #1  CSW will provide patient with a list of home health agencies offering home care services.  [facility] CM Short Term Goal #2   Patient will decide on an agency of choice, with regards to durable medical equipment, within the next three weeks.  [facility] CM Short Term Goal #2 Start Date  01/21/17  Interventions for Short Term Goal #2  CSW will provide patient with a list of agencies offering durable medical equipment.    [facility] CM Care Plan Problem Two     Most Recent Value  Care Plan Problem Two  Patient with  limited income.  Role Documenting the Problem Two  Clinical Social Worker  Care Plan for Problem Two  Active  Interventions for Problem Two Long Term Goal   CSW discussing and assisting with  Medicaid application  process.   [facility] Long Term Goal  Patient will have Medicaid determination in the next 90 days.   [place] Long Term Goal Start Date  01/28/17       Eduard Clos,  MSW, [place] Social Worker  [place] [phone]

## 2017-02-08 ENCOUNTER — Inpatient Hospital Stay: Payer: Medicare HMO

## 2017-02-08 DIAGNOSIS — I5023 Acute on chronic systolic (congestive) heart failure: Secondary | ICD-10-CM

## 2017-02-08 DIAGNOSIS — J9621 Acute and chronic respiratory failure with hypoxia: Secondary | ICD-10-CM

## 2017-02-08 DIAGNOSIS — J9601 Acute respiratory failure with hypoxia: Secondary | ICD-10-CM

## 2017-02-08 DIAGNOSIS — J90 Pleural effusion, not elsewhere classified: Secondary | ICD-10-CM

## 2017-02-08 DIAGNOSIS — Z515 Encounter for palliative care: Secondary | ICD-10-CM

## 2017-02-08 DIAGNOSIS — Z7189 Other specified counseling: Secondary | ICD-10-CM

## 2017-02-08 DIAGNOSIS — J441 Chronic obstructive pulmonary disease with (acute) exacerbation: Secondary | ICD-10-CM

## 2017-02-08 DIAGNOSIS — R0603 Acute respiratory distress: Secondary | ICD-10-CM

## 2017-02-08 LAB — BODY FLUID CELL COUNT WITH DIFFERENTIAL
Eos, Fluid: 0 %
LYMPHS FL: 16 %
Monocyte-Macrophage-Serous Fluid: 73 %
Neutrophil Count, Fluid: 10 %
OTHER CELLS FL: 1 %
Total Nucleated Cell Count, Fluid: 60 cu mm

## 2017-02-08 LAB — CBC
HEMATOCRIT: 34.2 % — AB (ref 35.0–47.0)
HEMOGLOBIN: 10.8 g/dL — AB (ref 12.0–16.0)
MCH: 22.1 pg — ABNORMAL LOW (ref 26.0–34.0)
MCHC: 31.6 g/dL — AB (ref 32.0–36.0)
MCV: 69.9 fL — ABNORMAL LOW (ref 80.0–100.0)
Platelets: 233 10*3/uL (ref 150–440)
RBC: 4.88 MIL/uL (ref 3.80–5.20)
RDW: 22.8 % — ABNORMAL HIGH (ref 11.5–14.5)
WBC: 8.1 10*3/uL (ref 3.6–11.0)

## 2017-02-08 LAB — BASIC METABOLIC PANEL
ANION GAP: 12 (ref 5–15)
BUN: 46 mg/dL — ABNORMAL HIGH (ref 6–20)
CALCIUM: 10 mg/dL (ref 8.9–10.3)
CO2: 27 mmol/L (ref 22–32)
Chloride: 100 mmol/L — ABNORMAL LOW (ref 101–111)
Creatinine, Ser: 1.48 mg/dL — ABNORMAL HIGH (ref 0.44–1.00)
GFR, EST AFRICAN AMERICAN: 34 mL/min — AB (ref >60)
GFR, EST NON AFRICAN AMERICAN: 30 mL/min — AB (ref >60)
Glucose, Bld: 117 mg/dL — ABNORMAL HIGH (ref 65–99)
POTASSIUM: 3.8 mmol/L (ref 3.5–5.1)
Sodium: 139 mmol/L (ref 135–145)

## 2017-02-08 LAB — PROTIME-INR
INR: 1.66
PROTHROMBIN TIME: 19.5 s — AB (ref 11.4–15.2)

## 2017-02-08 LAB — URIC ACID: Uric Acid, Serum: 13.6 mg/dL — ABNORMAL HIGH (ref 2.3–6.6)

## 2017-02-08 LAB — APTT: APTT: 32 s (ref 24–36)

## 2017-02-08 LAB — PATHOLOGIST SMEAR REVIEW

## 2017-02-08 LAB — PROCALCITONIN

## 2017-02-08 MED ORDER — ALLOPURINOL 100 MG PO TABS
100.0000 mg | ORAL_TABLET | Freq: Every day | ORAL | Status: DC
  Administered 2017-02-09 – 2017-02-12 (×4): 100 mg via ORAL
  Filled 2017-02-08 (×4): qty 1

## 2017-02-08 NOTE — Progress Notes (Signed)
Pt is in no distress. BIPAP is not at bedside and is not needed at this time. 

## 2017-02-08 NOTE — Progress Notes (Signed)
Patient ID: Wanda Davenport, female   DOB: 1923/01/25, 84 y.o.   MRN: [id number] I had a discussion with son.  He states that they address this issue with his mother in terms of CODE STATUS and that she would want one brief trial of CPR and mechanical ventilation if it came to that.  We will therefore abide by her wishes and change Elkins, DO

## 2017-02-08 NOTE — Care Management Note (Addendum)
Case Management Note  Patient Details  Name: Wanda Davenport MRN: [id number] Date of Birth: 1923-07-09  Subjective/Objective:              Presented from [facility]. Patient admitted to icu stepdown due to need for continuous bipap for CHF.  Last admission required thoracentesis and requiring another thoracentesis today removing 1.1 liters. Palliative consult pending.  Patient is DNR.   Chronic oxygen with [facility]. Was receiving in home palliative services through [facility]- a [facility] palliative service prior to last discharge. [facility] has provided services in the past    Action/Plan:  Notified [facility] of readmit. REached out to [facility]  Expected Discharge Date:                  Expected Discharge Plan:     In-House Referral:     Discharge planning Services     Post Acute Care Choice:    Choice offered to:     DME Arranged:    DME Agency:     HH Arranged:    HH Agency:     Status of Service:     If discussed at [employer], dates discussed:    Additional Comments:  Wanda Stack, RN 02/08/2017, 9:41 AM

## 2017-02-08 NOTE — Consult Note (Signed)
Cardiology Consultation Note    Patient ID: Wanda Davenport, MRN: [id number], DOB/AGE: 02-Jun-1923 84 y.o. Admit date: 02/07/2017   Date of Consult: 02/08/2017 Primary Physician: Mellody Dance, DO Primary Cardiologist: Dr. Nehemiah Massed  Chief Complaint: SOB Reason for Consultation: chf/efusion Requesting MD: Dr. Leslye Peer  HPI: Wanda Davenport is a 84 y.o. female with history of cad s/p anterior stemi in 2018 wit placement of a 2.5 x 38 mm des with resultant ef of 35% with relook cath in 5/18 showeing patent stent who has had recurrent left and right pleural effusions. She has had the left effusion treated with thoracentesis on two previous occasions and returned with worsening sob and was noted to have large left pleural effuson and smaller right pleural effusion. She had 1.1 liters removed. She has a right effusion that was not tapped. She had evidence of pulmonary edema. She is hemodynamically stable. She ahs noted gradual decline in her functional status in the days leading up to her admission.   Past Medical History:  Diagnosis Date  . Allergic rhinitis   . Anxiety   . Arthritis   . Atypical chest pain   . Bronchitis 01/14/2016  . Chronic diastolic CHF (congestive heart failure) (HCC)    Echo 06/2010 EF 60-65%, no RWMAs, grade 1 diastolic dysfunction, mild LAE, PASP 8mHg.  .Marland KitchenCoronary artery disease    MI  . DIVERTICULOSIS, COLON   . HEMORRHOIDS, INTERNAL   . Hepatic steatosis   . Hyperlipidemia   . HYPERTENSION   . LBBB (left bundle branch block)    a. present on ECG 06/2010  . Osteopenia   . Pre-diabetes    A1c 6.0      Surgical History:  Past Surgical History:  Procedure Laterality Date  . ABDOMINAL HYSTERECTOMY    . BREAST SURGERY    . CATARACT EXTRACTION W/PHACO Left 05/12/2014   Procedure: CATARACT EXTRACTION PHACO AND INTRAOCULAR LENS PLACEMENT (IOC);  Surgeon: CLeandrew Koyanagi MD;  [facility]  Service: Ophthalmology;  Laterality: Left;  .  COLONOSCOPY     a. 2010  . CORONARY STENT INTERVENTION N/A 04/26/2016   Procedure: Coronary Stent Intervention;  Surgeon: AIsaias Cowman MD;  [facility];  Service: Cardiovascular;  Laterality: N/A;  . LEFT HEART CATH AND CORONARY ANGIOGRAPHY N/A 04/26/2016   Procedure: Left Heart Cath and Coronary Angiography;  Surgeon: AIsaias Cowman MD;  [facility];  Service: Cardiovascular;  Laterality: N/A;  . LEFT HEART CATH AND CORONARY ANGIOGRAPHY N/A 05/05/2016   Procedure: Left Heart Cath and Coronary Angiography;  Surgeon: AWellington Hampshire MD;  [facility];  Service: Cardiovascular;  Laterality: N/A;  . TONSILLECTOMY       Home Meds: Prior to Admission medications   Medication Sig Start Date End Date Taking? Authorizing Provider  albuterol (PROVENTIL HFA;VENTOLIN HFA) 108 (90 BASE) MCG/ACT inhaler Inhale 2 puffs into the lungs every 6 (six) hours as needed. Wheezing or shortness of breath   Yes [provider]  apixaban (ELIQUIS) 5 MG TABS tablet Take 1 tablet (5 mg total) by mouth 2 (two) times daily. 01/24/17  Yes VVaughan Basta MD  aspirin 81 MG tablet Take by mouth daily.    Yes [provider]  bisoprolol (ZEBETA) 10 MG tablet Take 5 mg by mouth daily.   Yes [provider]  cephALEXin (KEFLEX) 500 MG capsule Take 500 mg by mouth 4 (four) times daily. For seven days 02/04/17 02/11/17  Yes [provider]  clonazepam (KLONOPIN) 0.125 MG disintegrating tablet Take 1 tablet (0.125 mg total) by mouth 2 (two) times daily as needed (anxiety). 01/17/17  Yes Henreitta Leber, MD  docusate sodium (COLACE) 100 MG capsule Take 1 capsule (100 mg total) by mouth 2 (two) times daily as needed for mild constipation. 01/24/17  Yes Vaughan Basta, MD  escitalopram (LEXAPRO) 10 MG tablet Take 1 tablet (10 mg total) by mouth 2 (two) times daily. 10/15/16  Yes Opalski, Deborah, DO  feeding supplement,  ENSURE ENLIVE, (ENSURE ENLIVE) LIQD Take 237 mLs by mouth 3 (three) times daily with meals. 01/24/17  Yes Vaughan Basta, MD  fluticasone (FLONASE) 50 MCG/ACT nasal spray Place 2 sprays into both nostrils daily. Nasal congestion Rarely used    Yes [provider]  ipratropium-albuterol (DUONEB) 0.5-2.5 (3) MG/3ML SOLN Take 3 mLs by nebulization 2 (two) times daily.   Yes [provider]  montelukast (SINGULAIR) 10 MG tablet Take 1 tablet (10 mg total) by mouth at bedtime. 08/14/16  Yes Danford, Valetta Fuller D, NP  nitroGLYCERIN (NITROSTAT) 0.4 MG SL tablet Place under the tongue every 5 (five) minutes as needed for chest pain.    Yes [provider]  polyethylene glycol (MIRALAX / GLYCOLAX) packet Take 17 g by mouth daily as needed for mild constipation. 01/17/17  Yes Sainani, Belia Heman, MD  predniSONE (DELTASONE) 20 MG tablet Take 20 mg by mouth as directed. Taper directions: 21m daily for 5 days, 426mdaily for 5 days then 2057maily for 5 days 02/07/17 02/22/17 Yes [provider]  Skin Protectants, Misc. (CALAZIME SKIN PROTECTANT EX) Apply 1 application topically as needed.   Yes [provider]  torsemide (DEMADEX) 20 MG tablet Take 1 tablet (20 mg total) by mouth 2 (two) times daily. Patient taking differently: Take 40 mg by mouth 2 (two) times daily.  01/25/17  Yes VacVaughan BastaD    Inpatient Medications:  . [START ON 02/09/2017] allopurinol  100 mg Oral Daily  . apixaban  5 mg Oral BID  . aspirin EC  81 mg Oral Daily  . bisoprolol  5 mg Oral Daily  . Chlorhexidine Gluconate Cloth  6 each Topical Q0600  . escitalopram  10 mg Oral BID  . feeding supplement (ENSURE ENLIVE)  237 mL Oral TID WC  . furosemide  40 mg Intravenous Q12H  . montelukast  10 mg Oral QHS  . mupirocin ointment  1 application Nasal BID  . sodium chloride flush  3 mL Intravenous Q12H   . sodium chloride      Allergies:  Allergies  Allergen Reactions  . Fenofibrate  Other (See Comments)    Myalgias/gi upset   . Statins Other (See Comments)    Myalgias   . Sulfonamide Derivatives Nausea And Vomiting    REACTION: nausea    Social History   Socioeconomic History  . Marital status: Widowed    Spouse name: Not on file  . Number of children: Not on file  . Years of education: Not on file  . Highest education level: Not on file  Social Needs  . Financial resource strain: Not on file  . Food insecurity - worry: Not on file  . Food insecurity - inability: Not on file  . Transportation needs - medical: Not on file  . Transportation needs - non-medical: Not on file  Occupational History  . Occupation: Retired  Tobacco Use  . Smoking status: Former Smoker    Packs/day:  0.25    Years: 20.00    Pack years: 5.00    Types: Cigarettes    Last attempt to quit: 01/02/1971    Years since quitting: 46.1  . Smokeless tobacco: Never Used  . Tobacco comment: smoked a few cigarettes/day x 20 yrs - quit @ age 68.  Substance and Sexual Activity  . Alcohol use: No  . Drug use: No  . Sexual activity: Not Currently  Other Topics Concern  . Not on file  Social History Narrative   Lives in [place] by herself.  She has family near-by.  She tries to stay active around the house - does all of her own housework and still mows (rides) her yard.      Daily caffeine      Family History  Problem Relation Age of Onset  . Colon cancer Sister   . Melanoma Sister   . Hyperlipidemia Brother   . Hypertension Mother   . Lupus Mother        died 46 from surgical complications  . Alzheimer's disease Brother        unknown  . Kidney disease Father        died 38  . Hypertension Father   . Alcohol abuse Father      Review of Systems: A 12-system review of systems was performed and is negative except as noted in the HPI.  Labs: Recent Labs    02/07/17 1502  TROPONINI 0.03*   Lab Results  Component Value Date   WBC 8.1 02/08/2017   HGB 10.8 (L) 02/08/2017    HCT 34.2 (L) 02/08/2017   MCV 69.9 (L) 02/08/2017   PLT 233 02/08/2017    Recent Labs  Lab 02/07/17 1603 02/08/17 0619  NA 137 139  K 3.6 3.8  CL 100* 100*  CO2 25 27  BUN 45* 46*  CREATININE 1.43* 1.48*  CALCIUM 10.0 10.0  PROT 6.7  --   BILITOT 1.1  --   ALKPHOS 92  --   ALT 17  --   AST 30  --   GLUCOSE 141* 117*   Lab Results  Component Value Date   CHOL 218 (H) 04/27/2016   HDL 44 04/27/2016   LDLCALC 146 (H) 04/27/2016   TRIG 141 04/27/2016   No results found for: DDIMER  Radiology/Studies:  Dg Chest 1 View  Result Date: 02/08/2017 CLINICAL DATA:  Status post left thoracentesis. EXAM: CHEST 1 VIEW COMPARISON:  Radiographs of February 07, 2017. FINDINGS: Mild cardiomegaly is noted. No pneumothorax is noted. Left pleural effusion is significantly smaller status post thoracentesis. Atherosclerosis of thoracic aorta is noted. Mild left basilar atelectasis and mild left pleural effusion remains. Mild right pleural effusion is noted with associated atelectasis which is improved compared to prior exam. IMPRESSION: Significantly improved left pleural effusion is noted status post thoracentesis. No pneumothorax is noted. Electronically Signed   By: Marijo Conception, M.D.   On: 02/08/2017 10:59   Dg Chest 1 View  Result Date: 01/22/2017 CLINICAL DATA:  Post left thoracentesis EXAM: CHEST 1 VIEW COMPARISON:  01/21/2017 FINDINGS: Large left pleural effusion has decreased following thoracentesis. No pneumothorax. Small to moderate right effusion. Bilateral lower lobe atelectasis or infiltrates. Mild cardiomegaly. IMPRESSION: Large left effusion and small to moderate right effusion. Left effusion decreased following thoracentesis. No pneumothorax. Bibasilar atelectasis or infiltrates. Electronically Signed   By: Rolm Baptise M.D.   On: 01/22/2017 11:37   Dg Chest 1 View  Result Date: 01/14/2017  CLINICAL DATA:  Post left thoracentesis EXAM: CHEST 1 VIEW COMPARISON:  01/12/2017  FINDINGS: Large left pleural effusion remains, slightly decreased since prior study. No pneumothorax. Left lower lobe atelectasis. No effusion or confluent opacity on the right. Mild cardiomegaly. IMPRESSION: Decreasing size of the large left pleural effusion. No pneumothorax. Electronically Signed   By: Rolm Baptise M.D.   On: 01/14/2017 12:13   Dg Chest 2 View  Result Date: 02/07/2017 CLINICAL DATA:  Shortness of Breath EXAM: CHEST  2 VIEW COMPARISON:  January 22, 2017 FINDINGS: There are sizable pleural effusions bilaterally, increased on the right and stable on the left. There is underlying consolidation in portions of the left lower lobe. There is probable compressive atelectasis in the right middle and lower lobe regions. There is cardiomegaly with pulmonary venous hypertension. No adenopathy. There is aortic atherosclerosis. There is a coronary stent in the left anterior descending coronary artery region. No evident bone lesions. IMPRESSION: Bilateral pleural effusions, increased on the right and stable on the left. There is underlying pulmonary vascular congestion. There may well be a degree of underlying congestive heart failure. Probable compressive atelectasis bilaterally. A degree of superimposed pneumonia in the left base cannot be excluded. Aortic atherosclerosis. Left anterior descending coronary artery stent present. Aortic Atherosclerosis (ICD10-I70.0). Electronically Signed   By: Lowella Grip III M.D.   On: 02/07/2017 14:31   Dg Chest 2 View  Result Date: 01/21/2017 CLINICAL DATA:  Shortness of breath and hypoxia. EXAM: CHEST  2 VIEW COMPARISON:  Chest x-ray dated January 17, 2017. FINDINGS: Stable cardiomegaly. Mild vascular congestion is unchanged. Interval increase in size of now large left and small right pleural effusions. Worsened bibasilar atelectasis. No acute osseous abnormality. IMPRESSION: Congestive heart failure with interval increase in size of large left and small right  pleural effusions. Electronically Signed   By: Titus Dubin M.D.   On: 01/21/2017 16:18   Dg Chest Port 1 View  Result Date: 01/17/2017 CLINICAL DATA:  CHF EXAM: PORTABLE CHEST 1 VIEW COMPARISON:  01/16/2017 FINDINGS: Cardiac enlargement. Congestive heart failure with mild edema unchanged. Bilateral pleural effusions left greater than right unchanged. Bibasilar atelectasis left greater than right unchanged. IMPRESSION: Congestive heart failure with edema and bilateral effusions left greater than right unchanged from yesterday. Electronically Signed   By: Franchot Gallo M.D.   On: 01/17/2017 11:27   Dg Chest Port 1 View  Result Date: 01/16/2017 CLINICAL DATA:  Pleural effusion. EXAM: PORTABLE CHEST 1 VIEW COMPARISON:  One-view chest x-ray 01/15/2017 FINDINGS: The heart is enlarged. Atherosclerotic changes are noted at the aortic arch. Diffuse interstitial edema is present. Left greater than right airspace disease and effusions are increasing. IMPRESSION: 1. Increasing bilateral airspace disease and effusions. While this may represent atelectasis, multi lobar pneumonia is also considered. 2. Stable cardiomegaly and edema compatible with congestive heart failure. 3.  Aortic Atherosclerosis (ICD10-I70.0). Electronically Signed   By: San Morelle M.D.   On: 01/16/2017 09:07   Dg Chest Port 1 View  Result Date: 01/15/2017 CLINICAL DATA:  Pleural effusion. EXAM: PORTABLE CHEST 1 VIEW COMPARISON:  Radiograph of January 14, 2017. FINDINGS: Stable cardiomegaly. No pneumothorax is noted. Left perihilar edema or pneumonia is noted. Left pleural effusion is smaller compared to prior exam. Probable mild right basilar atelectasis or infiltrate is noted with small right pleural effusion. Bony thorax is unremarkable. IMPRESSION: Significantly decreased size of left pleural effusion, with left perihilar edema or pneumonia present. Mild right basilar atelectasis or pneumonia is noted with  probable minimal right  pleural effusion. Electronically Signed   By: Marijo Conception, M.D.   On: 01/15/2017 07:53   Dg Chest Portable 1 View  Result Date: 01/12/2017 CLINICAL DATA:  Shortness of Breath EXAM: PORTABLE CHEST 1 VIEW COMPARISON:  07/12/2016 FINDINGS: Large left pleural effusion, worsened since prior study. Left lower lobe atelectasis. Cardiomegaly with vascular congestion. Suspect mild interstitial edema. IMPRESSION: Large left pleural effusion, increasing since prior study. Left lower lobe atelectasis. Suspect mild edema/CHF. Electronically Signed   By: Rolm Baptise M.D.   On: 01/12/2017 10:57   US Abdomen Limited Ruq  Result Date: 01/12/2017 CLINICAL DATA:  Elevated bilirubin level. EXAM: ULTRASOUND ABDOMEN LIMITED RIGHT UPPER QUADRANT COMPARISON:  06/17/2010 FINDINGS: Gallbladder: No gallstones or wall thickening visualized. No sonographic Murphy sign noted by sonographer. Common bile duct: Diameter: Normal, 4 mm Liver: No focal lesion identified. Within normal limits in parenchymal echogenicity. Portal vein is patent on color Doppler imaging with normal direction of blood flow towards the liver. Right pleural effusion. IMPRESSION: 1.  No acute process or explanation for elevated bilirubin level. 2. Right pleural effusion. Electronically Signed   By: Abigail Miyamoto M.D.   On: 01/12/2017 16:37   US Thoracentesis Asp Pleural Space W/img Guide  Result Date: 02/08/2017 INDICATION: Shortness of breath. Recurrent pleural effusions. Request for diagnostic and therapeutic thoracentesis EXAM: ULTRASOUND GUIDED LEFT THORACENTESIS MEDICATIONS: None. COMPLICATIONS: None immediate.  Post procedure CXR negative for pneumothorax. PROCEDURE: An ultrasound guided thoracentesis was thoroughly discussed with the patient and questions answered. The benefits, risks, alternatives and complications were also discussed. The patient understands and wishes to proceed with the procedure. Written consent was obtained. Ultrasound was  performed to localize and mark an adequate pocket of fluid in the left chest. The area was then prepped and draped in the normal sterile fashion. 1% Lidocaine was used for local anesthesia. Under ultrasound guidance a Safe-T-Centesis catheter was introduced. Thoracentesis was performed. The catheter was removed and a dressing applied. FINDINGS: A total of approximately 1.1 L of clear, dark yellow fluid was removed. Samples were sent to the laboratory as requested by the clinical team. IMPRESSION: Successful ultrasound guided left thoracentesis yielding 1.1 L of pleural fluid. Read by: Ascencion Dike PA-C Electronically Signed   By: Markus Daft M.D.   On: 02/08/2017 10:58   US Thoracentesis Asp Pleural Space W/img Guide  Result Date: 01/22/2017 INDICATION: Patient with history of CHF, bilateral pleural effusions. Request is made for diagnostic and therapeutic left thoracentesis. EXAM: ULTRASOUND GUIDED DIAGNOSTIC AND THERAPEUTIC LEFT THORACENTESIS MEDICATIONS: 15 mL 1% lidocaine COMPLICATIONS: None immediate. PROCEDURE: An ultrasound guided thoracentesis was thoroughly discussed with the patient and questions answered. The benefits, risks, alternatives and complications were also discussed. The patient understands and wishes to proceed with the procedure. Written consent was obtained. Ultrasound was performed to localize and mark an adequate pocket of fluid in the left chest. The area was then prepped and draped in the normal sterile fashion. 1% Lidocaine was used for local anesthesia. Under ultrasound guidance, an attempt for introduction into pleural fluid with a Safe-T-Centesis catheter was made. Catheter met significant resistance, thought to be related to an extensive amount of scar tissue and skin thickness. With ultrasound guidance, a second scan site was prepped and a 7-cm Yueh catheter was introduced. Thoracentesis was performed. The catheter was removed and a dressing applied. FINDINGS: A total of  approximately 600 mL of clear, yellow fluid was removed. Samples were sent to the laboratory as requested  by the clinical team. IMPRESSION: Successful ultrasound guided diagnostic and therapeutic left thoracentesis yielding 600 mL of pleural fluid. Read by: Brynda Greathouse PA-C Electronically Signed   By: Jerilynn Mages.  Shick M.D.   On: 01/22/2017 12:04   US Thoracentesis Asp Pleural Space W/img Guide  Result Date: 01/14/2017 INDICATION: Patient with history of heart failure, left pleural effusion. Request is made for diagnostic and therapeutic thoracentesis. EXAM: ULTRASOUND GUIDED DIAGNOSTIC AND THERAPEUTIC LEFT THORACENTESIS MEDICATIONS: 10 mL 1% lidocaine COMPLICATIONS: None immediate. PROCEDURE: An ultrasound guided thoracentesis was thoroughly discussed with the patient and questions answered. The benefits, risks, alternatives and complications were also discussed. The patient understands and wishes to proceed with the procedure. Written consent was obtained. Ultrasound was performed to localize and mark an adequate pocket of fluid in the left chest. The area was then prepped and draped in the normal sterile fashion. 1% Lidocaine was used for local anesthesia. Under ultrasound guidance a Safe-T-Centesis catheter was introduced. Thoracentesis was performed. The catheter was removed and a dressing applied. FINDINGS: A total of approximately 1.0 L of amber fluid was removed. Samples were sent to the laboratory as requested by the clinical team. IMPRESSION: Successful ultrasound guided diagnostic and therapeutic thoracentesis yielding 1.0 L of pleural fluid. Read by: Brynda Greathouse PA-C Electronically Signed   By: Aletta Edouard M.D.   On: 01/14/2017 16:36    Wt Readings from Last 3 Encounters:  02/08/17 72.1 kg (158 lb 15.2 oz)  02/05/17 70.9 kg (156 lb 6 oz)  01/25/17 69.4 kg (152 lb 14.4 oz)    EKG: afib with junctional rhythm.   Physical Exam: Blood pressure (!) 94/45, pulse 63, temperature 97.8 F (36.6  C), temperature source Oral, resp. rate 20, height _0  (1.626 m), weight 72.1 kg (158 lb 15.2 oz), SpO2 100 %. Body mass index is 27.28 kg/m. General: Well developed, well nourished, in no acute distress. Head: Normocephalic, atraumatic, sclera non-icteric, no xanthomas, nares are without discharge.  Neck: Negative for carotid bruits. JVD not elevated. Lungs: Decreased breath sounds right greater tah left.  Heart: regular rhythm with S1 S2. No murmurs, rubs, or gallops appreciated. Abdomen: Soft, non-tender, non-distended with normoactive bowel sounds. No hepatomegaly. No rebound/guarding. No obvious abdominal masses. Msk:  Strength and tone appear normal for age. Extremities: No clubbing or cyanosis. No edema.  Distal pedal pulses are 2+ and equal bilaterally. Neuro: Alert and oriented X 3. No facial asymmetry. No focal deficit. Moves all extremities spontaneously. Psych:  Responds to questions appropriately with a normal affect.     Assessment and Plan  84 yo female with ischemic cardiomyopathy, history of afib and history of recurrent pleural effusions.  CAD-s/p pci of lad in 5/18 with relook cath revealing patent stent and moderate disease in the rca and lcx treated medically. She is currently on asa and appears clinically stable. Will continue with asa and follow.   Afib-rate is controlled at present. Anticoaogulated with eliquis. Will conitnue with 5 mg bid as she is greater than 60kg and creatinine is less than 1.5. No evidence of bleeding.  CHF-continue with careful use of diuretics following renal function. Not candidate for after load reduciton of beta blockers due to relative bradycardia and hypotensin.   Pleural effusions-has had recurent effusions. Will likely recur. May need to discuss pleuridesis however, this may be poorly tolerated. Will continue to follow effuions and tap as necessary. Will discuss with  Pulmonary    Signed, Teodoro Spray MD 02/08/2017, 6:34  PM Pager: [phone]

## 2017-02-08 NOTE — Consult Note (Signed)
Consultation Note Date: 02/08/2017   Patient Name: Wanda Davenport  DOB: 09-27-1923  MRN: [id number]  Age / Sex: 84 y.o., female  PCP: Mellody Dance, DO Referring Physician: Loletha Grayer, MD  Reason for Consultation: Establishing goals of care  HPI/Patient Profile:The patient was sent from nursing home to the ED due to SOB.  The patient was confused, and placed on BiPAP.   She was admitted for acute on chronic respiratory failure with hypoxia due to CHF and possible pneumonia. She had a recent hospitalization in January with thoracentesis.    Clinical Assessment and Goals of Care: Patient resting in bed. Son [facility]) and daughter-in-law at bedside. The patient has been residing at [facility] for rehabilitation. She was recently admitted in January for similar symptoms with a thoracentesis. An additional thoracentesis was completed today. Ms. Latini states that she feels much better since having this procedure performed.  We discussed diagnosis, prognosis, GOC, EOL wishes disposition and options.  A detailed discussion was had today regarding advanced directives.  Concepts specific to code status, artifical feeding and hydration, IV antibiotics and rehospitalization was discussed.  The difference between an aggressive medical intervention path and a hospice comfort care path was discussed.   The patient states that if a time came that her breathing could not be made to feel better she would want to be made comfort care. At this point she is okay with continuing the thoracentesis procedures as needed and return to hospital. The family states they will consider and discuss a point in time where they would no longer do these procedures. We discussed CODE STATUS as that was a question earlier today. Upon discussion, she states that she would not want to have chest compressions, shocks, or a breathing tube if her  heart or breathing were to stop. She stated " Why put people through all of that? I don't want all of that, just let me go naturally." She stated she would never want to be placed on a breathing machine and stated "everyone has a time to go".   Except for a feeding tube, she would like treatment to the point of DNR/DNI.   MOST form completed, DNR, Section B: limited additional interventions, IVF and abx okay.   Concept of Hospice and Palliative Care were discussed  Natural trajectory and expectations were discussed.  Questions and concerns addressed.   Family encouraged to call with questions or concerns.  PMT will continue to support holistically.       SUMMARY OF RECOMMENDATIONS    DNR/DNI.   Recommend outpatient palliative medicine to follow to continue goals of care conversation symptom management.  Code Status/Advance Care Planning:  DNR    Symptom Management:   Per primary team  Palliative Prophylaxis:   Oral Care  Additional Recommendations (Limitations, Scope, Preferences):  No Artificial Feeding   Prognosis:   Unable to determine  Discharge Planning: To Be Determined      Primary Diagnoses: Present on Admission: . Acute on chronic respiratory failure with hypoxia (HCC)  I have reviewed the medical record, interviewed the patient and family, and examined the patient. The following aspects are pertinent.  Past Medical History:  Diagnosis Date  . Allergic rhinitis   . Anxiety   . Arthritis   . Atypical chest pain   . Bronchitis 01/14/2016  . Chronic diastolic CHF (congestive heart failure) (HCC)    Echo 06/2010 EF 60-65%, no RWMAs, grade 1 diastolic dysfunction, mild LAE, PASP 37mmHg.  Marland Kitchen Coronary artery disease    MI  . DIVERTICULOSIS, COLON   . HEMORRHOIDS, INTERNAL   . Hepatic steatosis   . Hyperlipidemia   . HYPERTENSION   . LBBB (left bundle branch block)    a. present on ECG 06/2010  . Osteopenia   . Pre-diabetes    A1c 6.0    Social History   Socioeconomic History  . Marital status: Widowed    Spouse name: None  . Number of children: None  . Years of education: None  . Highest education level: None  Social Needs  . Financial resource strain: None  . Food insecurity - worry: None  . Food insecurity - inability: None  . Transportation needs - medical: None  . Transportation needs - non-medical: None  Occupational History  . Occupation: Retired  Tobacco Use  . Smoking status: Former Smoker    Packs/day: 0.25    Years: 20.00    Pack years: 5.00    Types: Cigarettes    Last attempt to quit: 01/02/1971    Years since quitting: 46.1  . Smokeless tobacco: Never Used  . Tobacco comment: smoked a few cigarettes/day x 20 yrs - quit @ age 33.  Substance and Sexual Activity  . Alcohol use: No  . Drug use: No  . Sexual activity: Not Currently  Other Topics Concern  . None  Social History Narrative   Lives in [place] by herself.  She has family near-by.  She tries to stay active around the house - does all of her own housework and still mows (rides) her yard.      Daily caffeine    Family History  Problem Relation Age of Onset  . Colon cancer Sister   . Melanoma Sister   . Hyperlipidemia Brother   . Hypertension Mother   . Lupus Mother        died 75 from surgical complications  . Alzheimer's disease Brother        unknown  . Kidney disease Father        died 78  . Hypertension Father   . Alcohol abuse Father    Scheduled Meds: . [START ON 02/09/2017] allopurinol  100 mg Oral Daily  . apixaban  5 mg Oral BID  . aspirin EC  81 mg Oral Daily  . bisoprolol  5 mg Oral Daily  . Chlorhexidine Gluconate Cloth  6 each Topical Q0600  . escitalopram  10 mg Oral BID  . feeding supplement (ENSURE ENLIVE)  237 mL Oral TID WC  . furosemide  40 mg Intravenous Q12H  . montelukast  10 mg Oral QHS  . mupirocin ointment  1 application Nasal BID  . sodium chloride flush  3 mL Intravenous Q12H   Continuous  Infusions: . sodium chloride     PRN Meds:.sodium chloride, acetaminophen **OR** acetaminophen, albuterol, bisacodyl, clonazepam, docusate sodium, HYDROcodone-acetaminophen, nitroGLYCERIN, ondansetron **OR** ondansetron (ZOFRAN) IV, polyethylene glycol, senna-docusate, sodium chloride flush Medications Prior to Admission:  Prior to Admission medications   Medication Sig Start Date End Date  Taking? Authorizing Provider  albuterol (PROVENTIL HFA;VENTOLIN HFA) 108 (90 BASE) MCG/ACT inhaler Inhale 2 puffs into the lungs every 6 (six) hours as needed. Wheezing or shortness of breath   Yes [provider]  apixaban (ELIQUIS) 5 MG TABS tablet Take 1 tablet (5 mg total) by mouth 2 (two) times daily. 01/24/17  Yes Vaughan Basta, MD  aspirin 81 MG tablet Take by mouth daily.    Yes [provider]  bisoprolol (ZEBETA) 10 MG tablet Take 5 mg by mouth daily.   Yes [provider]  cephALEXin (KEFLEX) 500 MG capsule Take 500 mg by mouth 4 (four) times daily. For seven days 02/04/17 02/11/17 Yes [provider]  clonazepam (KLONOPIN) 0.125 MG disintegrating tablet Take 1 tablet (0.125 mg total) by mouth 2 (two) times daily as needed (anxiety). 01/17/17  Yes Henreitta Leber, MD  docusate sodium (COLACE) 100 MG capsule Take 1 capsule (100 mg total) by mouth 2 (two) times daily as needed for mild constipation. 01/24/17  Yes Vaughan Basta, MD  escitalopram (LEXAPRO) 10 MG tablet Take 1 tablet (10 mg total) by mouth 2 (two) times daily. 10/15/16  Yes Opalski, Deborah, DO  feeding supplement, ENSURE ENLIVE, (ENSURE ENLIVE) LIQD Take 237 mLs by mouth 3 (three) times daily with meals. 01/24/17  Yes Vaughan Basta, MD  fluticasone (FLONASE) 50 MCG/ACT nasal spray Place 2 sprays into both nostrils daily. Nasal congestion Rarely used    Yes [provider]  ipratropium-albuterol (DUONEB) 0.5-2.5 (3) MG/3ML SOLN Take 3 mLs by nebulization 2 (two) times daily.    Yes [provider]  montelukast (SINGULAIR) 10 MG tablet Take 1 tablet (10 mg total) by mouth at bedtime. 08/14/16  Yes Danford, Valetta Fuller D, NP  nitroGLYCERIN (NITROSTAT) 0.4 MG SL tablet Place under the tongue every 5 (five) minutes as needed for chest pain.    Yes [provider]  polyethylene glycol (MIRALAX / GLYCOLAX) packet Take 17 g by mouth daily as needed for mild constipation. 01/17/17  Yes Sainani, Belia Heman, MD  predniSONE (DELTASONE) 20 MG tablet Take 20 mg by mouth as directed. Taper directions: 60mg  daily for 5 days, 40mg  daily for 5 days then 20mg  daily for 5 days 02/07/17 02/22/17 Yes [provider]  Skin Protectants, Misc. (CALAZIME SKIN PROTECTANT EX) Apply 1 application topically as needed.   Yes [provider]  torsemide (DEMADEX) 20 MG tablet Take 1 tablet (20 mg total) by mouth 2 (two) times daily. Patient taking differently: Take 40 mg by mouth 2 (two) times daily.  01/25/17  Yes Vaughan Basta, MD   Allergies  Allergen Reactions  . Fenofibrate Other (See Comments)    Myalgias/gi upset   . Statins Other (See Comments)    Myalgias   . Sulfonamide Derivatives Nausea And Vomiting    REACTION: nausea   Review of Systems  Physical Exam  Vital Signs: BP (!) 94/45 (BP Location: Left Arm)   Pulse 63   Temp 97.8 F (36.6 C) (Oral)   Resp 20   Ht 5\' 4"  (1.626 m)   Wt 72.1 kg (158 lb 15.2 oz)   SpO2 100%   BMI 27.28 kg/m  Pain Assessment: No/denies pain   Pain Score: 0-No pain   SpO2: SpO2: 100 % O2 Device:SpO2: 100 % O2 Flow Rate: .O2 Flow Rate (L/min): 1 L/min  IO: Intake/output summary:   Intake/Output Summary (Last 24 hours) at 02/08/2017 1732 Last data filed at 02/08/2017 1308 Gross per 24 hour  Intake  387 ml  Output 1000 ml  Net -613 ml    LBM: Last BM Date: 02/07/17(per family) Baseline Weight: Weight: 70.8 kg (156 lb) Most recent weight: Weight: 72.1 kg (158 lb 15.2 oz)     Palliative Assessment/Data:  40%     Time In: 4:40 Time Out: 5:50 Time Total: 70 min Greater than 50%  of this time was spent counseling and coordinating care related to the above assessment and plan.  Signed by: Asencion Gowda, NP   Please contact Palliative Medicine Team phone at [phone] for questions and concerns.  For individual provider: See Shea Evans

## 2017-02-08 NOTE — Plan of Care (Signed)
  Progressing Education: Knowledge of General Education information will improve 02/08/2017 0417 - Progressing by Dena Billet, RN Clinical Measurements: Will remain free from infection 02/08/2017 0417 - Progressing by Dena Billet, RN Diagnostic test results will improve 02/08/2017 0417 - Progressing by Dena Billet, RN Respiratory complications will improve 02/08/2017 0417 - Progressing by Dena Billet, RN Note Progressed from bipap to nasal cannula Nutrition: Adequate nutrition will be maintained 02/08/2017 0417 - Progressing by Dena Billet, RN Elimination: Will not experience complications related to urinary retention 02/08/2017 0417 - Progressing by Dena Billet, RN Pain Managment: General experience of comfort will improve 02/08/2017 0417 - Progressing by Dena Billet, RN Safety: Ability to remain free from injury will improve 02/08/2017 0417 - Progressing by Dena Billet, RN Skin Integrity: Risk for impaired skin integrity will decrease 02/08/2017 0417 - Progressing by Dena Billet, RN

## 2017-02-08 NOTE — Consult Note (Signed)
   [facility] CM Inpatient Consult   02/08/2017  Wanda Davenport July 13, 1923 [id number]  Patient is currently active with [facility] for chronic disease management services.  Patient has been engaged by a [employer] and CSW.  Our community based plan of care has focused on disease management and community resource support. Patient was previously active with [facility] "home" program but went to [facility].  This Probation officer has contacted Ge[employer] data processor and also [facility] Education officer, museum with the anticipation of the patient returning to SNF.  [facility] will continue to follow for appropriate follow up.    Made Inpatient Case Manager, Nann,  aware that [facility] following. Of note, [facility] Care Management services does not replace or interfere with any services that are needed or arranged by inpatient case management or social work.  For additional questions or referrals please contact:   Natividad Brood, RN BSN [facility] Liaison  [phone] business mobile phone Toll free office [phone]

## 2017-02-08 NOTE — Progress Notes (Signed)
PT Cancellation Note  Patient Details Name: Wanda Davenport MRN: [id number] DOB: 17-Sep-1923   Cancelled Treatment:    Reason Eval/Treat Not Completed: Medical issues which prohibited therapy.  Will try again in the AM.   Ramond Dial 02/08/2017, 5:10 PM   Mee Hives, PT MS Acute Rehab Dept. Number: [place] and [place]

## 2017-02-08 NOTE — Progress Notes (Addendum)
Patient ID: Wanda Davenport, female   DOB: 04/02/1923, 84 y.o.   MRN: [id number]  [employer] PROGRESS NOTE  Wanda Davenport SWN:[id number] DOB: [birth year] DOA: 02/07/2017 PCP: Wanda Dance, DO  HPI/Subjective: Patient breathing much better since thoracentesis.  She is off the BiPAP mask and on nasal cannula at this time.  She has had a few thoracentesis in the past.  Objective: Vitals:   02/08/17 1241 02/08/17 1329  BP: 96/82 (!) 94/45  Pulse:  63  Resp:  20  Temp: 97.8 F (36.6 C) 97.8 F (36.6 C)  SpO2:  100%    Filed Weights   02/07/17 1319 02/07/17 1852 02/08/17 0430  Weight: 70.8 kg (156 lb) 72.1 kg (158 lb 15.2 oz) 72.1 kg (158 lb 15.2 oz)    ROS: Review of Systems  Constitutional: Negative for chills and fever.  Eyes: Negative for blurred vision.  Respiratory: Negative for cough and shortness of breath.   Cardiovascular: Negative for chest pain.  Gastrointestinal: Negative for abdominal pain, constipation, diarrhea, nausea and vomiting.  Genitourinary: Negative for dysuria.  Musculoskeletal: Negative for joint pain.  Neurological: Negative for dizziness and headaches.   Exam: Physical Exam  Constitutional: She is oriented to person, place, and time.  HENT:  Nose: No mucosal edema.  Mouth/Throat: No oropharyngeal exudate or posterior oropharyngeal edema.  Eyes: Conjunctivae, EOM and lids are normal. Pupils are equal, round, and reactive to light.  Neck: No JVD present. Carotid bruit is not present. No edema present. No thyroid mass and no thyromegaly present.  Cardiovascular: S1 normal and S2 normal. Exam reveals no gallop.  No murmur heard. Pulses:      Dorsalis pedis pulses are 2+ on the right side, and 2+ on the left side.  Respiratory: No respiratory distress. She has decreased breath sounds in the right middle field, the right lower field and the left lower field. She has no wheezes. She has rhonchi in the right middle field, the right lower field and  the left lower field. She has no rales.  GI: Soft. Bowel sounds are normal. There is no tenderness.  Musculoskeletal:       Right ankle: She exhibits swelling.       Left ankle: She exhibits swelling.  Lymphadenopathy:    She has no cervical adenopathy.  Neurological: She is alert and oriented to person, place, and time. No cranial nerve deficit.  Skin: Skin is warm. No rash noted. Nails show no clubbing.  Psychiatric: She has a normal mood and affect.      Data Reviewed: Basic Metabolic Panel: Recent Labs  Lab 02/07/17 1603 02/07/17 1918 02/08/17 0619  NA 137  --  139  K 3.6  --  3.8  CL 100*  --  100*  CO2 25  --  27  GLUCOSE 141*  --  117*  BUN 45*  --  46*  CREATININE 1.43*  --  1.48*  CALCIUM 10.0  --  10.0  MG  --  2.2  --    Liver Function Tests: Recent Labs  Lab 02/07/17 1603  AST 30  ALT 17  ALKPHOS 92  BILITOT 1.1  PROT 6.7  ALBUMIN 3.7   CBC: Recent Labs  Lab 02/07/17 1500 02/08/17 0619  WBC 7.0 8.1  NEUTROABS 6.6*  --   HGB 12.6 10.8*  HCT 41.3 34.2*  MCV 71.7* 69.9*  PLT 234 233   Cardiac Enzymes: Recent Labs  Lab 02/07/17 1502  TROPONINI 0.03*   BNP (  last 3 results) Recent Labs    05/19/16 2209 01/12/17 1023 02/07/17 1502  BNP 1,575.0* 2,508.0* 2,643.0*     CBG: Recent Labs  Lab 02/07/17 1852  GLUCAP 144*    Recent Results (from the past 240 hour(s))  MRSA PCR Screening     Status: Abnormal   Collection Time: 02/07/17  6:45 PM  Result Value Ref Range Status   MRSA by PCR POSITIVE (A) NEGATIVE Final    Comment:        The GeneXpert MRSA Assay (FDA approved for NASAL specimens only), is one component of a comprehensive MRSA colonization surveillance program. It is not intended to diagnose MRSA infection nor to guide or monitor treatment for MRSA infections. RESULT CALLED TO, READ BACK BY AND VERIFIED WITH: MEGAN FOACK ON 02/07/17 AT 2018 JAG Performed at [facility], [address], [place],  [place] [address]   Blood Culture (routine x 2)     Status: None (Preliminary result)   Collection Time: 02/07/17  7:18 PM  Result Value Ref Range Status   Specimen Description BLOOD LEFT HAND  Final   Special Requests   Final    BOTTLES DRAWN AEROBIC AND ANAEROBIC Blood Culture results may not be optimal due to an inadequate volume of blood received in culture bottles   Culture   Final    NO GROWTH < 24 HOURS Performed at [facility], [address], [place], [place] [address]    Report Status PENDING  Incomplete  Blood Culture (routine x 2)     Status: None (Preliminary result)   Collection Time: 02/07/17  8:52 PM  Result Value Ref Range Status   Specimen Description BLOOD RIGHT ANTECUBITAL  Final   Special Requests   Final    BOTTLES DRAWN AEROBIC AND ANAEROBIC Blood Culture adequate volume   Culture   Final    NO GROWTH < 24 HOURS Performed at [facility], [address], [place], [place] [address]    Report Status PENDING  Incomplete  Body fluid culture     Status: None (Preliminary result)   Collection Time: 02/08/17 10:15 AM  Result Value Ref Range Status   Specimen Description   Final    PLEURAL Performed at [facility], [address] [place], [place] [address]    Special Requests   Final    NONE Performed at [facility], [address] [place], [place] [address]    Gram Stain   Final    FEW WBC PRESENT,BOTH PMN AND MONONUCLEAR NO ORGANISMS SEEN Performed at [facility], [place] [address] [place], [place] [address]    Culture PENDING  Incomplete   Report Status PENDING  Incomplete     Studies: Dg Chest 1 View  Result Date: 02/08/2017 CLINICAL DATA:  Status post left thoracentesis. EXAM: CHEST 1 VIEW COMPARISON:  Radiographs of February 07, 2017. FINDINGS: Mild cardiomegaly is noted. No pneumothorax is noted. Left pleural effusion is significantly smaller status post thoracentesis. Atherosclerosis of thoracic aorta is  noted. Mild left basilar atelectasis and mild left pleural effusion remains. Mild right pleural effusion is noted with associated atelectasis which is improved compared to prior exam. IMPRESSION: Significantly improved left pleural effusion is noted status post thoracentesis. No pneumothorax is noted. Electronically Signed   By: Marijo Conception, M.D.   On: 02/08/2017 10:59   Dg Chest 2 View  Result Date: 02/07/2017 CLINICAL DATA:  Shortness of Breath EXAM: CHEST  2 VIEW  COMPARISON:  January 22, 2017 FINDINGS: There are sizable pleural effusions bilaterally, increased on the right and stable on the left. There is underlying consolidation in portions of the left lower lobe. There is probable compressive atelectasis in the right middle and lower lobe regions. There is cardiomegaly with pulmonary venous hypertension. No adenopathy. There is aortic atherosclerosis. There is a coronary stent in the left anterior descending coronary artery region. No evident bone lesions. IMPRESSION: Bilateral pleural effusions, increased on the right and stable on the left. There is underlying pulmonary vascular congestion. There may well be a degree of underlying congestive heart failure. Probable compressive atelectasis bilaterally. A degree of superimposed pneumonia in the left base cannot be excluded. Aortic atherosclerosis. Left anterior descending coronary artery stent present. Aortic Atherosclerosis (ICD10-I70.0). Electronically Signed   By: Lowella Grip III M.D.   On: 02/07/2017 14:31   US Thoracentesis Asp Pleural Space W/img Guide  Result Date: 02/08/2017 INDICATION: Shortness of breath. Recurrent pleural effusions. Request for diagnostic and therapeutic thoracentesis EXAM: ULTRASOUND GUIDED LEFT THORACENTESIS MEDICATIONS: None. COMPLICATIONS: None immediate.  Post procedure CXR negative for pneumothorax. PROCEDURE: An ultrasound guided thoracentesis was thoroughly discussed with the patient and questions answered.  The benefits, risks, alternatives and complications were also discussed. The patient understands and wishes to proceed with the procedure. Written consent was obtained. Ultrasound was performed to localize and mark an adequate pocket of fluid in the left chest. The area was then prepped and draped in the normal sterile fashion. 1% Lidocaine was used for local anesthesia. Under ultrasound guidance a Safe-T-Centesis catheter was introduced. Thoracentesis was performed. The catheter was removed and a dressing applied. FINDINGS: A total of approximately 1.1 L of clear, dark yellow fluid was removed. Samples were sent to the laboratory as requested by the clinical team. IMPRESSION: Successful ultrasound guided left thoracentesis yielding 1.1 L of pleural fluid. Read by: Ascencion Dike PA-C Electronically Signed   By: Markus Daft M.D.   On: 02/08/2017 10:58    Scheduled Meds: . apixaban  5 mg Oral BID  . aspirin EC  81 mg Oral Daily  . bisoprolol  5 mg Oral Daily  . Chlorhexidine Gluconate Cloth  6 each Topical Q0600  . escitalopram  10 mg Oral BID  . feeding supplement (ENSURE ENLIVE)  237 mL Oral TID WC  . furosemide  40 mg Intravenous Q12H  . montelukast  10 mg Oral QHS  . mupirocin ointment  1 application Nasal BID  . sodium chloride flush  3 mL Intravenous Q12H   Continuous Infusions: . sodium chloride      Assessment/Plan:  1. Acute on chronic respiratory failure with hypoxia.  Patient was initially placed on BiPAP.  Now on nasal cannula and transferred out to the floor. 2. Bilateral pleural effusions.  Status post thoracentesis 1.1 L on the left. 3. Acute on chronic systolic congestive heart failure.  On Lasix 40 mg IV twice daily.  Patient on bisoprolol.  Blood pressure little too low for ACE inhibitor or spironolactone at this point. 4. Atrial fibrillation on Eliquis.  Rate controlled with bisoprolol. 5. History of gout.  Uric acid elevated at 13.6.  Start low-dose allopurinol 6. History  of CAD.  On aspirin and bisoprolol 7. Prior CT scan showing thyroid nodule.  Check a TSH.  Code Status:     Code Status Orders  (From admission, onward)        Start     Ordered   02/08/17 1305  Full code  Continuous     02/08/17 1304    Code Status History    Date Active Date Inactive Code Status Order ID Comments User Context   02/07/2017 18:59 02/08/2017 13:04 DNR [id number]  Demetrios Loll, MD Inpatient   01/21/2017 20:00 01/25/2017 19:34 DNR [id number]  Vaughan Basta, MD Inpatient   01/12/2017 12:56 01/17/2017 23:00 Full Code [id number]  Demetrios Loll, MD Inpatient   05/20/2016 00:47 05/21/2016 17:02 Full Code [id number]  Lance Coon, MD Inpatient   05/15/2016 12:34 05/17/2016 18:08 Full Code [id number]  Fritzi Mandes, MD Inpatient   05/05/2016 03:31 05/06/2016 15:28 Full Code [id number]  Wellington Hampshire, MD Inpatient   04/26/2016 17:12 05/01/2016 03:49 Full Code [id number]  Nicholes Mango, MD Inpatient   04/26/2016 14:08 04/26/2016 17:12 Full Code [id number]  Isaias Cowman, MD Inpatient   09/23/2011 14:11 09/26/2011 16:43 Full Code [id number]  Willette Pa, RN Inpatient    Advance Directive Documentation     Most Recent Value  Type of Advance Directive  Out of facility DNR (pink MOST or yellow form)  Pre-existing out of facility DNR order (yellow form or pink MOST form)  No data  "MOST" Form in Place?  No data     Family Communication: Spoke with family at bedside Disposition Plan: To be determined  Consultants:  Consult cardiology  Time spent: 28 minutes  [place]

## 2017-02-08 NOTE — Procedures (Signed)
PROCEDURE SUMMARY:  [place] finds bilateral pleural effusions L>R.  Successful US guided left thoracentesis. Yielded 1.1 L of clear, dark yellow fluid. Pt tolerated procedure well. No immediate complications.  Specimen was sent for labs. CXR ordered.  Ascencion Dike PA-C 02/08/2017 10:30 AM

## 2017-02-09 LAB — BASIC METABOLIC PANEL
Anion gap: 14 (ref 5–15)
BUN: 57 mg/dL — ABNORMAL HIGH (ref 6–20)
CHLORIDE: 99 mmol/L — AB (ref 101–111)
CO2: 25 mmol/L (ref 22–32)
Calcium: 9.9 mg/dL (ref 8.9–10.3)
Creatinine, Ser: 1.6 mg/dL — ABNORMAL HIGH (ref 0.44–1.00)
GFR calc non Af Amer: 27 mL/min — ABNORMAL LOW (ref >60)
GFR, EST AFRICAN AMERICAN: 31 mL/min — AB (ref >60)
Glucose, Bld: 103 mg/dL — ABNORMAL HIGH (ref 65–99)
POTASSIUM: 3.6 mmol/L (ref 3.5–5.1)
SODIUM: 138 mmol/L (ref 135–145)

## 2017-02-09 LAB — TSH: TSH: 1.897 u[IU]/mL (ref 0.350–4.500)

## 2017-02-09 MED ORDER — FUROSEMIDE 40 MG PO TABS
40.0000 mg | ORAL_TABLET | Freq: Two times a day (BID) | ORAL | Status: DC
  Administered 2017-02-09 (×2): 40 mg via ORAL
  Filled 2017-02-09 (×2): qty 1

## 2017-02-09 MED ORDER — APIXABAN 2.5 MG PO TABS
2.5000 mg | ORAL_TABLET | Freq: Two times a day (BID) | ORAL | Status: DC
  Administered 2017-02-09 – 2017-02-12 (×7): 2.5 mg via ORAL
  Filled 2017-02-09 (×7): qty 1

## 2017-02-09 NOTE — Progress Notes (Signed)
Patient Name: Wanda Davenport Date of Encounter: 02/09/2017  Hospital Problem List     Active Problems:   Acute on chronic respiratory failure with hypoxia [facility])    Patient Profile     84 yo female with cad s/p anterior stemi in 5/18 with pci of lad who is admitted with recurrent pleural effusions, left greater than right. S/P thoracentesis.   Subjective   States whe feels a little better  Inpatient Medications    . allopurinol  100 mg Oral Daily  . apixaban  2.5 mg Oral BID  . aspirin EC  81 mg Oral Daily  . bisoprolol  5 mg Oral Daily  . Chlorhexidine Gluconate Cloth  6 each Topical Q0600  . escitalopram  10 mg Oral BID  . feeding supplement (ENSURE ENLIVE)  237 mL Oral TID WC  . furosemide  40 mg Oral BID  . montelukast  10 mg Oral QHS  . mupirocin ointment  1 application Nasal BID  . sodium chloride flush  3 mL Intravenous Q12H    Vital Signs    Vitals:   02/08/17 2342 02/09/17 0452 02/09/17 1224 02/09/17 1225  BP: 102/61 (!) 106/59 (!) 80/41 (!) 100/57  Pulse: 64 64 65 64  Resp: (!) 24 (!) 24 14   Temp: 98 F (36.7 C) (!) 97.4 F (36.3 C) 97.6 F (36.4 C)   TempSrc: Oral Oral Oral   SpO2: 99% 95% 100%   Weight:  72.5 kg (159 lb 14.4 oz)    Height:        Intake/Output Summary (Last 24 hours) at 02/09/2017 1318 Last data filed at 02/09/2017 0651 Gross per 24 hour  Intake 0 ml  Output 650 ml  Net -650 ml   Filed Weights   02/07/17 1852 02/08/17 0430 02/09/17 0452  Weight: 72.1 kg (158 lb 15.2 oz) 72.1 kg (158 lb 15.2 oz) 72.5 kg (159 lb 14.4 oz)    Physical Exam    GEN: Well nourished, well developed, in no acute distress.  HEENT: normal.  Neck: Supple, no JVD, carotid bruits, or masses. Cardiac: IRR, irr, no murmurs, rubs, or gallops. No clubbing, cyanosis, edema.  Radials/DP/PT 2+ and equal bilaterally.  Respiratory:  Decreased breath sounds at lung bases bilaterally GI: Soft, nontender, nondistended, BS + x 4. MS: no deformity or atrophy. Skin:  warm and dry, no rash. Neuro:  Strength and sensation are intact. Psych: Normal affect.  Labs    CBC Recent Labs    02/07/17 1500 02/08/17 0619  WBC 7.0 8.1  NEUTROABS 6.6*  --   HGB 12.6 10.8*  HCT 41.3 34.2*  MCV 71.7* 69.9*  PLT 234 660   Basic Metabolic Panel Recent Labs    02/07/17 1918 02/08/17 0619 02/09/17 0557  NA  --  139 138  K  --  3.8 3.6  CL  --  100* 99*  CO2  --  27 25  GLUCOSE  --  117* 103*  BUN  --  46* 57*  CREATININE  --  1.48* 1.60*  CALCIUM  --  10.0 9.9  MG 2.2  --   --    Liver Function Tests Recent Labs    02/07/17 1603  AST 30  ALT 17  ALKPHOS 92  BILITOT 1.1  PROT 6.7  ALBUMIN 3.7   No results for input(s): LIPASE, AMYLASE in the last 72 hours. Cardiac Enzymes Recent Labs    02/07/17 1502  TROPONINI 0.03*   BNP Recent  Labs    02/07/17 1502  BNP 2,643.0*   D-Dimer No results for input(s): DDIMER in the last 72 hours. Hemoglobin A1C No results for input(s): HGBA1C in the last 72 hours. Fasting Lipid Panel No results for input(s): CHOL, HDL, LDLCALC, TRIG, CHOLHDL, LDLDIRECT in the last 72 hours. Thyroid Function Tests Recent Labs    02/09/17 0557  TSH 1.897    Telemetry    afib with controlled vr.   ECG    afib with variable vr and rbbb  Radiology    Dg Chest 1 View  Result Date: 02/08/2017 CLINICAL DATA:  Status post left thoracentesis. EXAM: CHEST 1 VIEW COMPARISON:  Radiographs of February 07, 2017. FINDINGS: Mild cardiomegaly is noted. No pneumothorax is noted. Left pleural effusion is significantly smaller status post thoracentesis. Atherosclerosis of thoracic aorta is noted. Mild left basilar atelectasis and mild left pleural effusion remains. Mild right pleural effusion is noted with associated atelectasis which is improved compared to prior exam. IMPRESSION: Significantly improved left pleural effusion is noted status post thoracentesis. No pneumothorax is noted. Electronically Signed   By: Marijo Conception, M.D.   On: 02/08/2017 10:59   Dg Chest 1 View  Result Date: 01/22/2017 CLINICAL DATA:  Post left thoracentesis EXAM: CHEST 1 VIEW COMPARISON:  01/21/2017 FINDINGS: Large left pleural effusion has decreased following thoracentesis. No pneumothorax. Small to moderate right effusion. Bilateral lower lobe atelectasis or infiltrates. Mild cardiomegaly. IMPRESSION: Large left effusion and small to moderate right effusion. Left effusion decreased following thoracentesis. No pneumothorax. Bibasilar atelectasis or infiltrates. Electronically Signed   By: Rolm Baptise M.D.   On: 01/22/2017 11:37   Dg Chest 1 View  Result Date: 01/14/2017 CLINICAL DATA:  Post left thoracentesis EXAM: CHEST 1 VIEW COMPARISON:  01/12/2017 FINDINGS: Large left pleural effusion remains, slightly decreased since prior study. No pneumothorax. Left lower lobe atelectasis. No effusion or confluent opacity on the right. Mild cardiomegaly. IMPRESSION: Decreasing size of the large left pleural effusion. No pneumothorax. Electronically Signed   By: Rolm Baptise M.D.   On: 01/14/2017 12:13   Dg Chest 2 View  Result Date: 02/07/2017 CLINICAL DATA:  Shortness of Breath EXAM: CHEST  2 VIEW COMPARISON:  January 22, 2017 FINDINGS: There are sizable pleural effusions bilaterally, increased on the right and stable on the left. There is underlying consolidation in portions of the left lower lobe. There is probable compressive atelectasis in the right middle and lower lobe regions. There is cardiomegaly with pulmonary venous hypertension. No adenopathy. There is aortic atherosclerosis. There is a coronary stent in the left anterior descending coronary artery region. No evident bone lesions. IMPRESSION: Bilateral pleural effusions, increased on the right and stable on the left. There is underlying pulmonary vascular congestion. There may well be a degree of underlying congestive heart failure. Probable compressive atelectasis bilaterally. A degree of  superimposed pneumonia in the left base cannot be excluded. Aortic atherosclerosis. Left anterior descending coronary artery stent present. Aortic Atherosclerosis (ICD10-I70.0). Electronically Signed   By: Lowella Grip III M.D.   On: 02/07/2017 14:31   Dg Chest 2 View  Result Date: 01/21/2017 CLINICAL DATA:  Shortness of breath and hypoxia. EXAM: CHEST  2 VIEW COMPARISON:  Chest x-ray dated January 17, 2017. FINDINGS: Stable cardiomegaly. Mild vascular congestion is unchanged. Interval increase in size of now large left and small right pleural effusions. Worsened bibasilar atelectasis. No acute osseous abnormality. IMPRESSION: Congestive heart failure with interval increase in size of large left and small  right pleural effusions. Electronically Signed   By: Titus Dubin M.D.   On: 01/21/2017 16:18   Dg Chest Port 1 View  Result Date: 01/17/2017 CLINICAL DATA:  CHF EXAM: PORTABLE CHEST 1 VIEW COMPARISON:  01/16/2017 FINDINGS: Cardiac enlargement. Congestive heart failure with mild edema unchanged. Bilateral pleural effusions left greater than right unchanged. Bibasilar atelectasis left greater than right unchanged. IMPRESSION: Congestive heart failure with edema and bilateral effusions left greater than right unchanged from yesterday. Electronically Signed   By: Franchot Gallo M.D.   On: 01/17/2017 11:27   Dg Chest Port 1 View  Result Date: 01/16/2017 CLINICAL DATA:  Pleural effusion. EXAM: PORTABLE CHEST 1 VIEW COMPARISON:  One-view chest x-ray 01/15/2017 FINDINGS: The heart is enlarged. Atherosclerotic changes are noted at the aortic arch. Diffuse interstitial edema is present. Left greater than right airspace disease and effusions are increasing. IMPRESSION: 1. Increasing bilateral airspace disease and effusions. While this may represent atelectasis, multi lobar pneumonia is also considered. 2. Stable cardiomegaly and edema compatible with congestive heart failure. 3.  Aortic Atherosclerosis  (ICD10-I70.0). Electronically Signed   By: San Morelle M.D.   On: 01/16/2017 09:07   Dg Chest Port 1 View  Result Date: 01/15/2017 CLINICAL DATA:  Pleural effusion. EXAM: PORTABLE CHEST 1 VIEW COMPARISON:  Radiograph of January 14, 2017. FINDINGS: Stable cardiomegaly. No pneumothorax is noted. Left perihilar edema or pneumonia is noted. Left pleural effusion is smaller compared to prior exam. Probable mild right basilar atelectasis or infiltrate is noted with small right pleural effusion. Bony thorax is unremarkable. IMPRESSION: Significantly decreased size of left pleural effusion, with left perihilar edema or pneumonia present. Mild right basilar atelectasis or pneumonia is noted with probable minimal right pleural effusion. Electronically Signed   By: Marijo Conception, M.D.   On: 01/15/2017 07:53   Dg Chest Portable 1 View  Result Date: 01/12/2017 CLINICAL DATA:  Shortness of Breath EXAM: PORTABLE CHEST 1 VIEW COMPARISON:  07/12/2016 FINDINGS: Large left pleural effusion, worsened since prior study. Left lower lobe atelectasis. Cardiomegaly with vascular congestion. Suspect mild interstitial edema. IMPRESSION: Large left pleural effusion, increasing since prior study. Left lower lobe atelectasis. Suspect mild edema/CHF. Electronically Signed   By: Rolm Baptise M.D.   On: 01/12/2017 10:57   US Abdomen Limited Ruq  Result Date: 01/12/2017 CLINICAL DATA:  Elevated bilirubin level. EXAM: ULTRASOUND ABDOMEN LIMITED RIGHT UPPER QUADRANT COMPARISON:  06/17/2010 FINDINGS: Gallbladder: No gallstones or wall thickening visualized. No sonographic Murphy sign noted by sonographer. Common bile duct: Diameter: Normal, 4 mm Liver: No focal lesion identified. Within normal limits in parenchymal echogenicity. Portal vein is patent on color Doppler imaging with normal direction of blood flow towards the liver. Right pleural effusion. IMPRESSION: 1.  No acute process or explanation for elevated bilirubin level.  2. Right pleural effusion. Electronically Signed   By: Abigail Miyamoto M.D.   On: 01/12/2017 16:37   US Thoracentesis Asp Pleural Space W/img Guide  Result Date: 02/08/2017 INDICATION: Shortness of breath. Recurrent pleural effusions. Request for diagnostic and therapeutic thoracentesis EXAM: ULTRASOUND GUIDED LEFT THORACENTESIS MEDICATIONS: None. COMPLICATIONS: None immediate.  Post procedure CXR negative for pneumothorax. PROCEDURE: An ultrasound guided thoracentesis was thoroughly discussed with the patient and questions answered. The benefits, risks, alternatives and complications were also discussed. The patient understands and wishes to proceed with the procedure. Written consent was obtained. Ultrasound was performed to localize and mark an adequate pocket of fluid in the left chest. The area was then prepped and  draped in the normal sterile fashion. 1% Lidocaine was used for local anesthesia. Under ultrasound guidance a Safe-T-Centesis catheter was introduced. Thoracentesis was performed. The catheter was removed and a dressing applied. FINDINGS: A total of approximately 1.1 L of clear, dark yellow fluid was removed. Samples were sent to the laboratory as requested by the clinical team. IMPRESSION: Successful ultrasound guided left thoracentesis yielding 1.1 L of pleural fluid. Read by: Ascencion Dike PA-C Electronically Signed   By: Markus Daft M.D.   On: 02/08/2017 10:58   US Thoracentesis Asp Pleural Space W/img Guide  Result Date: 01/22/2017 INDICATION: Patient with history of CHF, bilateral pleural effusions. Request is made for diagnostic and therapeutic left thoracentesis. EXAM: ULTRASOUND GUIDED DIAGNOSTIC AND THERAPEUTIC LEFT THORACENTESIS MEDICATIONS: 15 mL 1% lidocaine COMPLICATIONS: None immediate. PROCEDURE: An ultrasound guided thoracentesis was thoroughly discussed with the patient and questions answered. The benefits, risks, alternatives and complications were also discussed. The patient  understands and wishes to proceed with the procedure. Written consent was obtained. Ultrasound was performed to localize and mark an adequate pocket of fluid in the left chest. The area was then prepped and draped in the normal sterile fashion. 1% Lidocaine was used for local anesthesia. Under ultrasound guidance, an attempt for introduction into pleural fluid with a Safe-T-Centesis catheter was made. Catheter met significant resistance, thought to be related to an extensive amount of scar tissue and skin thickness. With ultrasound guidance, a second scan site was prepped and a 7-cm Yueh catheter was introduced. Thoracentesis was performed. The catheter was removed and a dressing applied. FINDINGS: A total of approximately 600 mL of clear, yellow fluid was removed. Samples were sent to the laboratory as requested by the clinical team. IMPRESSION: Successful ultrasound guided diagnostic and therapeutic left thoracentesis yielding 600 mL of pleural fluid. Read by: Brynda Greathouse PA-C Electronically Signed   By: Jerilynn Mages.  Shick M.D.   On: 01/22/2017 12:04   US Thoracentesis Asp Pleural Space W/img Guide  Result Date: 01/14/2017 INDICATION: Patient with history of heart failure, left pleural effusion. Request is made for diagnostic and therapeutic thoracentesis. EXAM: ULTRASOUND GUIDED DIAGNOSTIC AND THERAPEUTIC LEFT THORACENTESIS MEDICATIONS: 10 mL 1% lidocaine COMPLICATIONS: None immediate. PROCEDURE: An ultrasound guided thoracentesis was thoroughly discussed with the patient and questions answered. The benefits, risks, alternatives and complications were also discussed. The patient understands and wishes to proceed with the procedure. Written consent was obtained. Ultrasound was performed to localize and mark an adequate pocket of fluid in the left chest. The area was then prepped and draped in the normal sterile fashion. 1% Lidocaine was used for local anesthesia. Under ultrasound guidance a Safe-T-Centesis catheter  was introduced. Thoracentesis was performed. The catheter was removed and a dressing applied. FINDINGS: A total of approximately 1.0 L of amber fluid was removed. Samples were sent to the laboratory as requested by the clinical team. IMPRESSION: Successful ultrasound guided diagnostic and therapeutic thoracentesis yielding 1.0 L of pleural fluid. Read by: Brynda Greathouse PA-C Electronically Signed   By: Aletta Edouard M.D.   On: 01/14/2017 16:36    Assessment & Plan    afib-rate controlled at present. Continue with eliquis howevere, will reduce to 2.5 mg bid due to creatinine greater than 1.5 and age greater than 21. No bleeding at present. Continue with bisoprolol for rate control.   Pleural effusions.-better after thoracentesis. Continue diuresis following renal function and electrolytes.   Signed, Javier Docker Ayannah Faddis MD 02/09/2017, 1:18 PM  Pager: [phone]

## 2017-02-09 NOTE — Clinical Social Work Note (Signed)
Clinical Social Work Assessment  Patient Details  Name: Wanda Davenport MRN: [id number] Date of Birth: 24-May-1923  Date of referral:  02/09/17               Reason for consult:  Facility Placement                Permission sought to share information with:  Facility Art therapist granted to share information::     Name::        Agency::     Relationship::     Contact Information:     Housing/Transportation Living arrangements for the past 2 months:  [place] of Information:  Patient, Medical Team, Adult Children, Facility Patient Interpreter Needed:  None Criminal Activity/Legal Involvement Pertinent to Current Situation/Hospitalization:  No - Comment as needed Significant Relationships:  Adult Children, Warehouse manager Lives with:  Self Do you feel safe going back to the place where you live?  Yes Need for family participation in patient care:  No (Coment)  Care giving concerns:  Patient admitted from [facility] Worker assessment / plan:  CSW met with the patient and her daughter at bedside to discuss discharge planning. The patient's daughter reported that she is a Copy resident at [facility], and she is planning to return when stable. The CSW confirmed such with [facility]. The patient will most likely return in 1-2 days. CSW will facilitate discharge when the patient is stable.  Employment status:  Retired Nurse, adult PT Recommendations:  Not assessed at this time Information / Referral to community resources:  [place]  Patient/Family's Response to care:  The patient and her daughter thanked the CSW.  Patient/Family's Understanding of and Emotional Response to Diagnosis, Current Treatment, and Prognosis:  The patient and her family understand and agree with the discharge plan.  Emotional Assessment Appearance:  Appears stated age Attitude/Demeanor/Rapport:   Engaged Affect (typically observed):  Appropriate Orientation:  Oriented to Self, Oriented to Place, Oriented to  Time, Oriented to Situation Alcohol / Substance use:  Never Used Psych involvement (Current and /or in the community):  No (Comment)  Discharge Needs  Concerns to be addressed:  Care Coordination, Discharge Planning Concerns Readmission within the last 30 days:  Yes Current discharge risk:  Chronically ill Barriers to Discharge:  Continued Medical Work up   [employer], LCSW 02/09/2017, 4:28 PM

## 2017-02-09 NOTE — Progress Notes (Signed)
Patient is retaining urine. Straight cath patient twice during the night, 324mL of urine removed at 0030 and 350 mL at 0651. Patient stated she feels some relief at this time.

## 2017-02-09 NOTE — NC FL2 (Signed)
[place] LEVEL OF CARE SCREENING TOOL     IDENTIFICATION  Patient Name: Wanda Davenport Birthdate: [birth year] Sex: female Admission Date (Current Location): 02/07/2017  [place] and [place] Number:  Engineering geologist and Address:  [facility], [address] [place], [place] [address]      Provider Number: [id number]  Attending Physician Name and Address:  Hillary Bow, MD  Relative Name and Phone Number:       Current Level of Care: Hospital Recommended Level of Care: [place] Prior Approval Number:    Date Approved/Denied:   PASRR Number: [id number]  Discharge Plan: SNF    Current Diagnoses: Patient Active Problem List   Diagnosis Date Noted  . Hypotension 02/05/2017  . Lymphedema 02/05/2017  . Malnutrition of moderate degree 01/22/2017  . Acute on chronic respiratory failure with hypoxia ([place]) 01/21/2017  . Acute on chronic systolic CHF (congestive heart failure) ([place]) 01/12/2017  . Bilateral leg edema 09/05/2016  . Sinusitis, acute maxillary 07/12/2016  . Pleural effusion, left 07/12/2016  . Chronic systolic heart failure ([place]) 06/15/2016  . PE (pulmonary thromboembolism) ([place]) 05/19/2016  . Major depression in remission ([place]) 05/07/2016  . Pneumonia, community acquired 05/07/2016  . Unstable angina ([place])   . Coronary artery disease of native artery of native heart with stable angina pectoris ([place]) 05/01/2016  . STEMI (ST elevation myocardial infarction) ([place]) 04/26/2016  . Knee pain, bilateral 04/10/2016  . Medication monitoring encounter 01/26/2016  . Intolerance of drug- statins 01/25/2016  . Counseling regarding end of life decision making 01/25/2016  . Hypertriglyceridemia 12/13/2015  . Low serum HDL 12/13/2015  . B12 deficiency 12/13/2015  . Claudication of left lower extremity ([place]) 12/13/2015  . Vitamin D deficiency 11/06/2015  . Adjustment disorder with mixed anxiety and depressed mood  11/06/2015  . Basal cell carcinoma 10/31/2015  . Environmental and seasonal allergies 10/31/2015  . Reactive airway disease- only spring and fall due to allergies 10/31/2015  . GAD (generalized anxiety disorder) 10/13/2015  . Atypical chest pain 04/16/2011  . Hyperlipidemia   . Pre-diabetes   . chronic LBBB (left bundle branch block)- since 2012   . Generalized OA   . Allergic rhinitis   . DIVERTICULOSIS, COLON 12/03/2007  . HTN (hypertension) 04/26/2007    Orientation RESPIRATION BLADDER Height & Weight     Self, Time, Situation, Place  O2 Continent Weight: 159 lb 14.4 oz (72.5 kg) Height:  5\' 4"  (162.6 cm)  BEHAVIORAL SYMPTOMS/MOOD NEUROLOGICAL BOWEL NUTRITION STATUS      Continent Diet(Heart Healthy)  AMBULATORY STATUS COMMUNICATION OF NEEDS Skin   Extensive Assist Verbally Normal                       Personal Care Assistance Level of Assistance  Feeding, Bathing, Dressing Bathing Assistance: Limited assistance Feeding assistance: Independent Dressing Assistance: Limited assistance     Functional Limitations Info  Hearing Sight Info: Adequate Hearing Info: Impaired Speech Info: Adequate    SPECIAL CARE FACTORS FREQUENCY  PT (By licensed PT), OT (By licensed OT)     PT Frequency: Up top 5/week OT Frequency: Up top 5/week            Contractures Contractures Info: Not present    Additional Factors Info  Code Status, Allergies, Psychotropic Code Status Info: DNR Allergies Info: Fenofibrate, Statins, Sulfonamide Derivatives Psychotropic Info: Klonopin, Lexapro         Current Medications (02/09/2017):  This is the  current hospital active medication list Current Facility-Administered Medications  Medication Dose Route Frequency Provider Last Rate Last Dose  . 0.9 %  sodium chloride infusion  250 mL Intravenous PRN Demetrios Loll, MD      . acetaminophen (TYLENOL) tablet 650 mg  650 mg Oral Q6H PRN Demetrios Loll, MD       Or  . acetaminophen (TYLENOL)  suppository 650 mg  650 mg Rectal Q6H PRN Demetrios Loll, MD      . albuterol (PROVENTIL) (2.5 MG/3ML) 0.083% nebulizer solution 2.5 mg  2.5 mg Nebulization Q2H PRN Demetrios Loll, MD   2.5 mg at 02/09/17 0025  . allopurinol (ZYLOPRIM) tablet 100 mg  100 mg Oral Daily Loletha Grayer, MD   100 mg at 02/09/17 0930  . apixaban (ELIQUIS) tablet 2.5 mg  2.5 mg Oral BID Teodoro Spray, MD   2.5 mg at 02/09/17 0930  . aspirin EC tablet 81 mg  81 mg Oral Daily Demetrios Loll, MD   81 mg at 02/09/17 0930  . bisacodyl (DULCOLAX) EC tablet 5 mg  5 mg Oral Daily PRN Demetrios Loll, MD   5 mg at 02/08/17 1214  . bisoprolol (ZEBETA) tablet 5 mg  5 mg Oral Daily Demetrios Loll, MD   5 mg at 02/09/17 0930  . Chlorhexidine Gluconate Cloth 2 % PADS 6 each  6 each Topical Q0600 Awilda Bill, NP   6 each at 02/09/17 [phone]  . clonazepam (KLONOPIN) disintegrating tablet 0.125 mg  0.125 mg Oral BID PRN Demetrios Loll, MD      . docusate sodium (COLACE) capsule 100 mg  100 mg Oral BID PRN Demetrios Loll, MD      . escitalopram (LEXAPRO) tablet 10 mg  10 mg Oral BID Demetrios Loll, MD   10 mg at 02/09/17 0930  . feeding supplement (ENSURE ENLIVE) (ENSURE ENLIVE) liquid 237 mL  237 mL Oral TID WC Demetrios Loll, MD   237 mL at 02/09/17 1200  . furosemide (LASIX) tablet 40 mg  40 mg Oral BID Teodoro Spray, MD   40 mg at 02/09/17 0930  . HYDROcodone-acetaminophen (NORCO/VICODIN) 5-325 MG per tablet 1-2 tablet  1-2 tablet Oral Q4H PRN Demetrios Loll, MD      . montelukast (SINGULAIR) tablet 10 mg  10 mg Oral QHS Demetrios Loll, MD   10 mg at 02/08/17 2238  . mupirocin ointment (BACTROBAN) 2 % 1 application  1 application Nasal BID Awilda Bill, NP   1 application at 83/15/17 0931  . nitroGLYCERIN (NITROSTAT) SL tablet 0.4 mg  0.4 mg Sublingual Q5 min PRN Demetrios Loll, MD      . ondansetron [facility]) tablet 4 mg  4 mg Oral Q6H PRN Demetrios Loll, MD       Or  . ondansetron [facility]) injection 4 mg  4 mg Intravenous Q6H PRN Demetrios Loll, MD      . polyethylene glycol  (MIRALAX / GLYCOLAX) packet 17 g  17 g Oral Daily PRN Demetrios Loll, MD      . senna-docusate (Senokot-S) tablet 1 tablet  1 tablet Oral QHS PRN Demetrios Loll, MD      . sodium chloride flush (NS) 0.9 % injection 3 mL  3 mL Intravenous Q12H Demetrios Loll, MD   3 mL at 02/09/17 0931  . sodium chloride flush (NS) 0.9 % injection 3 mL  3 mL Intravenous PRN Demetrios Loll, MD         Discharge Medications: Please see discharge summary  for a list of discharge medications.  Relevant Imaging Results:  Relevant Lab Results:   Additional Information SS# [id number]  Zettie Pho, LCSW

## 2017-02-09 NOTE — Evaluation (Signed)
Physical Therapy Evaluation Patient Details Name: Wanda Davenport MRN: [id number] DOB: 08/06/1923 Today's Date: 02/09/2017   History of Present Illness  84 yo female was admitted after having reduction in O2 sats and thoracentesis on 2/8.  Has been getting others, improved breathing afterward.  CONTACT for MRSA, has another collection of fluid on R lung that was not tapped.  PMHx:  CHF, HTN, CAD, anxiety, on 2L of O2 chronically, bronchitis, L BBB, HLD, hepatic steatosis, prediabetes,    Clinical Impression  Pt is quite weak today with difficulty standing bedside and needed extra assistance to control balance.  Pt is not able to walk far but is still appropriate to return to SNF for finishing her rehab since first thoracentesis.  Will follow acutely for strength and controlling balance, progress to safety with AD and will look to see if pt has potential then to return home.  Has sustained considerable changes in her health and daughter is concerned that pt is not doing well enough to be fully independent eventually.    Follow Up Recommendations SNF    Equipment Recommendations  None recommended by PT    Recommendations for Other Services       Precautions / Restrictions Precautions Precautions: Fall(telemetry, ck O2 sats with activity) Restrictions Weight Bearing Restrictions: No      Mobility  Bed Mobility Overal bed mobility: Needs Assistance Bed Mobility: Supine to Sit     Supine to sit: Mod assist     General bed mobility comments: pt could assist but helped to use trunk for coming to sitting unsupported(assisted to pivot legs on side of bed)  Transfers Overall transfer level: Needs assistance Equipment used: Rolling walker (2 wheeled) Transfers: Sit to/from Stand Sit to Stand: Min assist;Mod assist         General transfer comment: reminders for hands on bed to stand, cannot tolerate being touched on UE's to stand  Ambulation/Gait Ambulation/Gait assistance: Min  assist;Mod assist Ambulation Distance (Feet): 4 Feet Assistive device: Rolling walker (2 wheeled) Gait Pattern/deviations: Step-to pattern;Decreased stride length;Shuffle;Wide base of support Gait velocity: reduced Gait velocity interpretation: Below normal speed for age/gender General Gait Details: desaturated to 90% but also was at 115 pulse, had weakness to side step bedside.  Stairs            Wheelchair Mobility    Modified Rankin (Stroke Patients Only)       Balance Overall balance assessment: Needs assistance;History of Falls Sitting-balance support: Bilateral upper extremity supported;Feet supported Sitting balance-Leahy Scale: Fair Sitting balance - Comments: fair once set   Standing balance support: Bilateral upper extremity supported;During functional activity Standing balance-Leahy Scale: Poor                               Pertinent Vitals/Pain Pain Assessment: Faces Faces Pain Scale: Hurts even more Pain Location: B legs with all movement Pain Descriptors / Indicators: Tender Pain Intervention(s): Limited activity within patient's tolerance;Monitored during session;Premedicated before session;Repositioned;Other (comment)(elevated on soft surfaces)    Home Living Family/patient expects to be discharged to:: Skilled nursing facility Living Arrangements: Alone Available Help at Discharge: Family;Available PRN/intermittently Type of Home: [place] Access: Stairs to enter Entrance Stairs-Rails: None Entrance Stairs-Number of Steps: 2 Home Layout: One level Home Equipment: Walker - 2 wheels;Cane - quad Additional Comments: Pt recently from [facility]     Prior Function Level of Independence: Needs assistance   Gait / Transfers Assistance  Needed: RW for short trips at home  ADL's / Homemaking Assistance Needed: previously independent at home before recent rehab admission  Comments: RW needed for all mobility      Hand  Dominance   Dominant Hand: Right    Extremity/Trunk Assessment   Upper Extremity Assessment Upper Extremity Assessment: Generalized weakness    Lower Extremity Assessment Lower Extremity Assessment: Generalized weakness    Cervical / Trunk Assessment Cervical / Trunk Assessment: Kyphotic  Communication   Communication: HOH  Cognition Arousal/Alertness: Awake/alert Behavior During Therapy: Flat affect;Anxious Overall Cognitive Status: History of cognitive impairments - at baseline                                        General Comments      Exercises     Assessment/Plan    PT Assessment Patient needs continued PT services  PT Problem List Decreased strength;Pain;Decreased balance;Decreased activity tolerance;Decreased mobility;Decreased knowledge of use of DME;Decreased cognition       PT Treatment Interventions DME instruction;Gait training;Functional mobility training;Therapeutic activities;Therapeutic exercise;Stair training;Balance training;Cognitive remediation;Patient/family education    PT Goals (Current goals can be found in the Care Plan section)  Acute Rehab PT Goals Patient Stated Goal: none stated PT Goal Formulation: With patient Time For Goal Achievement: 02/23/17 Potential to Achieve Goals: Good    Frequency Min 2X/week   Barriers to discharge Inaccessible home environment;Decreased caregiver support      Co-evaluation               AM-PAC PT "6 Clicks" Daily Activity  Outcome Measure Difficulty turning over in bed (including adjusting bedclothes, sheets and blankets)?: A Little Difficulty moving from lying on back to sitting on the side of the bed? : Unable Difficulty sitting down on and standing up from a chair with arms (e.g., wheelchair, bedside commode, etc,.)?: Unable Help needed moving to and from a bed to chair (including a wheelchair)?: A Lot Help needed walking in hospital room?: A Lot Help needed climbing 3-5  steps with a railing? : Total 6 Click Score: 10    End of Session Equipment Utilized During Treatment: Gait belt;Oxygen Activity Tolerance: Patient limited by fatigue;Other (comment)(limited by LE weakness, O2 sats low) Patient left: with call bell/phone within reach;in bed;with bed alarm set;with family/visitor present Nurse Communication: Mobility status PT Visit Diagnosis: Unsteadiness on feet (R26.81);Muscle weakness (generalized) (M62.81);History of falling (Z91.81)    Time: 0973-5329 PT Time Calculation (min) (ACUTE ONLY): 36 min   Charges:   PT Evaluation $PT Eval Moderate Complexity: 1 Mod PT Treatments $Therapeutic Activity: 8-22 mins   PT G Codes:   PT G-Codes **NOT FOR INPATIENT CLASS** Functional Assessment Tool Used: AM-PAC 6 Clicks Basic Mobility    Ramond Dial 02/09/2017, 5:07 PM   Mee Hives, PT MS Acute Rehab Dept. Number: [place]

## 2017-02-10 LAB — BASIC METABOLIC PANEL
ANION GAP: 11 (ref 5–15)
BUN: 59 mg/dL — AB (ref 6–20)
CHLORIDE: 100 mmol/L — AB (ref 101–111)
CO2: 28 mmol/L (ref 22–32)
Calcium: 9.8 mg/dL (ref 8.9–10.3)
Creatinine, Ser: 1.36 mg/dL — ABNORMAL HIGH (ref 0.44–1.00)
GFR calc Af Amer: 38 mL/min — ABNORMAL LOW (ref >60)
GFR calc non Af Amer: 33 mL/min — ABNORMAL LOW (ref >60)
GLUCOSE: 105 mg/dL — AB (ref 65–99)
POTASSIUM: 3.7 mmol/L (ref 3.5–5.1)
Sodium: 139 mmol/L (ref 135–145)

## 2017-02-10 MED ORDER — FUROSEMIDE 10 MG/ML IJ SOLN
40.0000 mg | Freq: Two times a day (BID) | INTRAMUSCULAR | Status: DC
  Administered 2017-02-10 – 2017-02-12 (×5): 40 mg via INTRAVENOUS
  Filled 2017-02-10 (×5): qty 4

## 2017-02-10 NOTE — Progress Notes (Signed)
Patient Name: Wanda Davenport Date of Encounter: 02/10/2017  Hospital Problem List     Active Problems:   Acute on chronic respiratory failure with hypoxia [facility])    Patient Profile     84 yo with ischemic cardiomyopathy and recurrent bilateral pleural effusions.   Subjective   Stable with less sob  Inpatient Medications    . allopurinol  100 mg Oral Daily  . apixaban  2.5 mg Oral BID  . aspirin EC  81 mg Oral Daily  . bisoprolol  5 mg Oral Daily  . Chlorhexidine Gluconate Cloth  6 each Topical Q0600  . escitalopram  10 mg Oral BID  . feeding supplement (ENSURE ENLIVE)  237 mL Oral TID WC  . furosemide  40 mg Intravenous BID  . montelukast  10 mg Oral QHS  . mupirocin ointment  1 application Nasal BID  . sodium chloride flush  3 mL Intravenous Q12H    Vital Signs    Vitals:   02/09/17 2141 02/10/17 0500 02/10/17 0619 02/10/17 1311  BP: 90/70  (!) 101/58 (!) 110/48  Pulse: 69  65 66  Resp: 16  18 (!) 24  Temp: (!) 97.5 F (36.4 C)  97.6 F (36.4 C)   TempSrc: Oral  Oral   SpO2: 100%  100% 97%  Weight:  72.7 kg (160 lb 3.2 oz)    Height:        Intake/Output Summary (Last 24 hours) at 02/10/2017 1649 Last data filed at 02/10/2017 1354 Gross per 24 hour  Intake 337 ml  Output 1375 ml  Net -1038 ml   Filed Weights   02/08/17 0430 02/09/17 0452 02/10/17 0500  Weight: 72.1 kg (158 lb 15.2 oz) 72.5 kg (159 lb 14.4 oz) 72.7 kg (160 lb 3.2 oz)    Physical Exam    GEN: Well nourished, well developed, in no acute distress.  HEENT: normal.  Neck: Supple, no JVD, carotid bruits, or masses. Cardiac: RRR, no murmurs, rubs, or gallops. No clubbing, cyanosis, edema.  Radials/DP/PT 2+ and equal bilaterally.  Respiratory:  Respirations regular and unlabored, clear to auscultation bilaterally. GI: Soft, nontender, nondistended, BS + x 4. MS: no deformity or atrophy. Skin: warm and dry, no rash. Neuro:  Strength and sensation are intact. Psych: Normal affect.  Labs     CBC Recent Labs    02/08/17 0619  WBC 8.1  HGB 10.8*  HCT 34.2*  MCV 69.9*  PLT 970   Basic Metabolic Panel Recent Labs    02/07/17 1918  02/09/17 0557 02/10/17 0525  NA  --    < > 138 139  K  --    < > 3.6 3.7  CL  --    < > 99* 100*  CO2  --    < > 25 28  GLUCOSE  --    < > 103* 105*  BUN  --    < > 57* 59*  CREATININE  --    < > 1.60* 1.36*  CALCIUM  --    < > 9.9 9.8  MG 2.2  --   --   --    < > = values in this interval not displayed.   Liver Function Tests No results for input(s): AST, ALT, ALKPHOS, BILITOT, PROT, ALBUMIN in the last 72 hours. No results for input(s): LIPASE, AMYLASE in the last 72 hours. Cardiac Enzymes No results for input(s): CKTOTAL, CKMB, CKMBINDEX, TROPONINI in the last 72 hours. BNP No results  for input(s): BNP in the last 72 hours. D-Dimer No results for input(s): DDIMER in the last 72 hours. Hemoglobin A1C No results for input(s): HGBA1C in the last 72 hours. Fasting Lipid Panel No results for input(s): CHOL, HDL, LDLCALC, TRIG, CHOLHDL, LDLDIRECT in the last 72 hours. Thyroid Function Tests Recent Labs    02/09/17 0557  TSH 1.897    Telemetry    Probable junctional rhythm  ECG    Junctional rhythm  Radiology    Dg Chest 1 View  Result Date: 02/08/2017 CLINICAL DATA:  Status post left thoracentesis. EXAM: CHEST 1 VIEW COMPARISON:  Radiographs of February 07, 2017. FINDINGS: Mild cardiomegaly is noted. No pneumothorax is noted. Left pleural effusion is significantly smaller status post thoracentesis. Atherosclerosis of thoracic aorta is noted. Mild left basilar atelectasis and mild left pleural effusion remains. Mild right pleural effusion is noted with associated atelectasis which is improved compared to prior exam. IMPRESSION: Significantly improved left pleural effusion is noted status post thoracentesis. No pneumothorax is noted. Electronically Signed   By: Marijo Conception, M.D.   On: 02/08/2017 10:59   Dg Chest 1  View  Result Date: 01/22/2017 CLINICAL DATA:  Post left thoracentesis EXAM: CHEST 1 VIEW COMPARISON:  01/21/2017 FINDINGS: Large left pleural effusion has decreased following thoracentesis. No pneumothorax. Small to moderate right effusion. Bilateral lower lobe atelectasis or infiltrates. Mild cardiomegaly. IMPRESSION: Large left effusion and small to moderate right effusion. Left effusion decreased following thoracentesis. No pneumothorax. Bibasilar atelectasis or infiltrates. Electronically Signed   By: Rolm Baptise M.D.   On: 01/22/2017 11:37   Dg Chest 1 View  Result Date: 01/14/2017 CLINICAL DATA:  Post left thoracentesis EXAM: CHEST 1 VIEW COMPARISON:  01/12/2017 FINDINGS: Large left pleural effusion remains, slightly decreased since prior study. No pneumothorax. Left lower lobe atelectasis. No effusion or confluent opacity on the right. Mild cardiomegaly. IMPRESSION: Decreasing size of the large left pleural effusion. No pneumothorax. Electronically Signed   By: Rolm Baptise M.D.   On: 01/14/2017 12:13   Dg Chest 2 View  Result Date: 02/07/2017 CLINICAL DATA:  Shortness of Breath EXAM: CHEST  2 VIEW COMPARISON:  January 22, 2017 FINDINGS: There are sizable pleural effusions bilaterally, increased on the right and stable on the left. There is underlying consolidation in portions of the left lower lobe. There is probable compressive atelectasis in the right middle and lower lobe regions. There is cardiomegaly with pulmonary venous hypertension. No adenopathy. There is aortic atherosclerosis. There is a coronary stent in the left anterior descending coronary artery region. No evident bone lesions. IMPRESSION: Bilateral pleural effusions, increased on the right and stable on the left. There is underlying pulmonary vascular congestion. There may well be a degree of underlying congestive heart failure. Probable compressive atelectasis bilaterally. A degree of superimposed pneumonia in the left base cannot  be excluded. Aortic atherosclerosis. Left anterior descending coronary artery stent present. Aortic Atherosclerosis (ICD10-I70.0). Electronically Signed   By: Lowella Grip III M.D.   On: 02/07/2017 14:31   Dg Chest 2 View  Result Date: 01/21/2017 CLINICAL DATA:  Shortness of breath and hypoxia. EXAM: CHEST  2 VIEW COMPARISON:  Chest x-ray dated January 17, 2017. FINDINGS: Stable cardiomegaly. Mild vascular congestion is unchanged. Interval increase in size of now large left and small right pleural effusions. Worsened bibasilar atelectasis. No acute osseous abnormality. IMPRESSION: Congestive heart failure with interval increase in size of large left and small right pleural effusions. Electronically Signed   By: Gwyndolyn Saxon  Marzella Schlein M.D.   On: 01/21/2017 16:18   Dg Chest Port 1 View  Result Date: 01/17/2017 CLINICAL DATA:  CHF EXAM: PORTABLE CHEST 1 VIEW COMPARISON:  01/16/2017 FINDINGS: Cardiac enlargement. Congestive heart failure with mild edema unchanged. Bilateral pleural effusions left greater than right unchanged. Bibasilar atelectasis left greater than right unchanged. IMPRESSION: Congestive heart failure with edema and bilateral effusions left greater than right unchanged from yesterday. Electronically Signed   By: Franchot Gallo M.D.   On: 01/17/2017 11:27   Dg Chest Port 1 View  Result Date: 01/16/2017 CLINICAL DATA:  Pleural effusion. EXAM: PORTABLE CHEST 1 VIEW COMPARISON:  One-view chest x-ray 01/15/2017 FINDINGS: The heart is enlarged. Atherosclerotic changes are noted at the aortic arch. Diffuse interstitial edema is present. Left greater than right airspace disease and effusions are increasing. IMPRESSION: 1. Increasing bilateral airspace disease and effusions. While this may represent atelectasis, multi lobar pneumonia is also considered. 2. Stable cardiomegaly and edema compatible with congestive heart failure. 3.  Aortic Atherosclerosis (ICD10-I70.0). Electronically Signed   By:  San Morelle M.D.   On: 01/16/2017 09:07   Dg Chest Port 1 View  Result Date: 01/15/2017 CLINICAL DATA:  Pleural effusion. EXAM: PORTABLE CHEST 1 VIEW COMPARISON:  Radiograph of January 14, 2017. FINDINGS: Stable cardiomegaly. No pneumothorax is noted. Left perihilar edema or pneumonia is noted. Left pleural effusion is smaller compared to prior exam. Probable mild right basilar atelectasis or infiltrate is noted with small right pleural effusion. Bony thorax is unremarkable. IMPRESSION: Significantly decreased size of left pleural effusion, with left perihilar edema or pneumonia present. Mild right basilar atelectasis or pneumonia is noted with probable minimal right pleural effusion. Electronically Signed   By: Marijo Conception, M.D.   On: 01/15/2017 07:53   Dg Chest Portable 1 View  Result Date: 01/12/2017 CLINICAL DATA:  Shortness of Breath EXAM: PORTABLE CHEST 1 VIEW COMPARISON:  07/12/2016 FINDINGS: Large left pleural effusion, worsened since prior study. Left lower lobe atelectasis. Cardiomegaly with vascular congestion. Suspect mild interstitial edema. IMPRESSION: Large left pleural effusion, increasing since prior study. Left lower lobe atelectasis. Suspect mild edema/CHF. Electronically Signed   By: Rolm Baptise M.D.   On: 01/12/2017 10:57   US Abdomen Limited Ruq  Result Date: 01/12/2017 CLINICAL DATA:  Elevated bilirubin level. EXAM: ULTRASOUND ABDOMEN LIMITED RIGHT UPPER QUADRANT COMPARISON:  06/17/2010 FINDINGS: Gallbladder: No gallstones or wall thickening visualized. No sonographic Murphy sign noted by sonographer. Common bile duct: Diameter: Normal, 4 mm Liver: No focal lesion identified. Within normal limits in parenchymal echogenicity. Portal vein is patent on color Doppler imaging with normal direction of blood flow towards the liver. Right pleural effusion. IMPRESSION: 1.  No acute process or explanation for elevated bilirubin level. 2. Right pleural effusion. Electronically  Signed   By: Abigail Miyamoto M.D.   On: 01/12/2017 16:37   US Thoracentesis Asp Pleural Space W/img Guide  Result Date: 02/08/2017 INDICATION: Shortness of breath. Recurrent pleural effusions. Request for diagnostic and therapeutic thoracentesis EXAM: ULTRASOUND GUIDED LEFT THORACENTESIS MEDICATIONS: None. COMPLICATIONS: None immediate.  Post procedure CXR negative for pneumothorax. PROCEDURE: An ultrasound guided thoracentesis was thoroughly discussed with the patient and questions answered. The benefits, risks, alternatives and complications were also discussed. The patient understands and wishes to proceed with the procedure. Written consent was obtained. Ultrasound was performed to localize and mark an adequate pocket of fluid in the left chest. The area was then prepped and draped in the normal sterile fashion. 1% Lidocaine was  used for local anesthesia. Under ultrasound guidance a Safe-T-Centesis catheter was introduced. Thoracentesis was performed. The catheter was removed and a dressing applied. FINDINGS: A total of approximately 1.1 L of clear, dark yellow fluid was removed. Samples were sent to the laboratory as requested by the clinical team. IMPRESSION: Successful ultrasound guided left thoracentesis yielding 1.1 L of pleural fluid. Read by: Ascencion Dike PA-C Electronically Signed   By: Markus Daft M.D.   On: 02/08/2017 10:58   US Thoracentesis Asp Pleural Space W/img Guide  Result Date: 01/22/2017 INDICATION: Patient with history of CHF, bilateral pleural effusions. Request is made for diagnostic and therapeutic left thoracentesis. EXAM: ULTRASOUND GUIDED DIAGNOSTIC AND THERAPEUTIC LEFT THORACENTESIS MEDICATIONS: 15 mL 1% lidocaine COMPLICATIONS: None immediate. PROCEDURE: An ultrasound guided thoracentesis was thoroughly discussed with the patient and questions answered. The benefits, risks, alternatives and complications were also discussed. The patient understands and wishes to proceed with the  procedure. Written consent was obtained. Ultrasound was performed to localize and mark an adequate pocket of fluid in the left chest. The area was then prepped and draped in the normal sterile fashion. 1% Lidocaine was used for local anesthesia. Under ultrasound guidance, an attempt for introduction into pleural fluid with a Safe-T-Centesis catheter was made. Catheter met significant resistance, thought to be related to an extensive amount of scar tissue and skin thickness. With ultrasound guidance, a second scan site was prepped and a 7-cm Yueh catheter was introduced. Thoracentesis was performed. The catheter was removed and a dressing applied. FINDINGS: A total of approximately 600 mL of clear, yellow fluid was removed. Samples were sent to the laboratory as requested by the clinical team. IMPRESSION: Successful ultrasound guided diagnostic and therapeutic left thoracentesis yielding 600 mL of pleural fluid. Read by: Brynda Greathouse PA-C Electronically Signed   By: Jerilynn Mages.  Shick M.D.   On: 01/22/2017 12:04   US Thoracentesis Asp Pleural Space W/img Guide  Result Date: 01/14/2017 INDICATION: Patient with history of heart failure, left pleural effusion. Request is made for diagnostic and therapeutic thoracentesis. EXAM: ULTRASOUND GUIDED DIAGNOSTIC AND THERAPEUTIC LEFT THORACENTESIS MEDICATIONS: 10 mL 1% lidocaine COMPLICATIONS: None immediate. PROCEDURE: An ultrasound guided thoracentesis was thoroughly discussed with the patient and questions answered. The benefits, risks, alternatives and complications were also discussed. The patient understands and wishes to proceed with the procedure. Written consent was obtained. Ultrasound was performed to localize and mark an adequate pocket of fluid in the left chest. The area was then prepped and draped in the normal sterile fashion. 1% Lidocaine was used for local anesthesia. Under ultrasound guidance a Safe-T-Centesis catheter was introduced. Thoracentesis was  performed. The catheter was removed and a dressing applied. FINDINGS: A total of approximately 1.0 L of amber fluid was removed. Samples were sent to the laboratory as requested by the clinical team. IMPRESSION: Successful ultrasound guided diagnostic and therapeutic thoracentesis yielding 1.0 L of pleural fluid. Read by: Brynda Greathouse PA-C Electronically Signed   By: Aletta Edouard M.D.   On: 01/14/2017 16:36    Assessment & Plan    Recurrent effusions.-has recurrent plerual effusions bilatarally. Will discuss with thoracic surgery regarding PleurX but patient would be high risk for this procedure. Pts daughter asked about hospice care and I think this is a reasonable discussion. Pt would benefit for outpatient hospice care at her home. Poor prognosis for greater than 6 month survival. Continue diuresis.   Signed, Javier Docker Fath MD 02/10/2017, 4:49 PM  Pager: [phone]

## 2017-02-10 NOTE — Progress Notes (Signed)
Patient ID: Wanda Davenport, female   DOB: August 25, 1923, 84 y.o.   MRN: [id number]  [facility] PROGRESS NOTE  Wanda Davenport ATF:[id number] DOB: [birth year] DOA: 02/07/2017 PCP: Mellody Dance, DO  HPI/Subjective:  On 3 L O2. Daughter at bedside.  Patient's breathing has improved with left thoracentesis.  Objective: Vitals:   02/09/17 2141 02/10/17 0619  BP: 90/70 (!) 101/58  Pulse: 69 65  Resp: 16 18  Temp: (!) 97.5 F (36.4 C) 97.6 F (36.4 C)  SpO2: 100% 100%    Filed Weights   02/08/17 0430 02/09/17 0452 02/10/17 0500  Weight: 72.1 kg (158 lb 15.2 oz) 72.5 kg (159 lb 14.4 oz) 72.7 kg (160 lb 3.2 oz)    ROS: Review of Systems  Constitutional: Negative for chills and fever.  Eyes: Negative for blurred vision.  Respiratory: Negative for cough and shortness of breath.   Cardiovascular: Negative for chest pain.  Gastrointestinal: Negative for abdominal pain, constipation, diarrhea, nausea and vomiting.  Genitourinary: Negative for dysuria.  Musculoskeletal: Negative for joint pain.  Neurological: Negative for dizziness and headaches.   Exam: Physical Exam  Constitutional: She is oriented to person, place, and time.  HENT:  Nose: No mucosal edema.  Mouth/Throat: No oropharyngeal exudate or posterior oropharyngeal edema.  Eyes: Conjunctivae, EOM and lids are normal. Pupils are equal, round, and reactive to light.  Neck: No JVD present. Carotid bruit is not present. No edema present. No thyroid mass and no thyromegaly present.  Cardiovascular: S1 normal and S2 normal. Exam reveals no gallop.  No murmur heard. Pulses:      Dorsalis pedis pulses are 2+ on the right side, and 2+ on the left side.  Respiratory: No respiratory distress. She has decreased breath sounds in the right middle field, the right lower field and the left lower field. She has no wheezes. She has rhonchi in the right middle field, the right lower field and the left lower field. She has no rales.  GI:  Soft. Bowel sounds are normal. There is no tenderness.  Musculoskeletal:       Right ankle: She exhibits swelling.       Left ankle: She exhibits swelling.  Lymphadenopathy:    She has no cervical adenopathy.  Neurological: She is alert and oriented to person, place, and time. No cranial nerve deficit.  Skin: Skin is warm. No rash noted. Nails show no clubbing.  Psychiatric: She has a normal mood and affect.      Data Reviewed: Basic Metabolic Panel: Recent Labs  Lab 02/07/17 1603 02/07/17 1918 02/08/17 0619 02/09/17 0557 02/10/17 0525  NA 137  --  139 138 139  K 3.6  --  3.8 3.6 3.7  CL 100*  --  100* 99* 100*  CO2 25  --  27 25 28   GLUCOSE 141*  --  117* 103* 105*  BUN 45*  --  46* 57* 59*  CREATININE 1.43*  --  1.48* 1.60* 1.36*  CALCIUM 10.0  --  10.0 9.9 9.8  MG  --  2.2  --   --   --    Liver Function Tests: Recent Labs  Lab 02/07/17 1603  AST 30  ALT 17  ALKPHOS 92  BILITOT 1.1  PROT 6.7  ALBUMIN 3.7   CBC: Recent Labs  Lab 02/07/17 1500 02/08/17 0619  WBC 7.0 8.1  NEUTROABS 6.6*  --   HGB 12.6 10.8*  HCT 41.3 34.2*  MCV 71.7* 69.9*  PLT 234 233  Cardiac Enzymes: Recent Labs  Lab 02/07/17 1502  TROPONINI 0.03*   BNP (last 3 results) Recent Labs    05/19/16 2209 01/12/17 1023 02/07/17 1502  BNP 1,575.0* 2,508.0* 2,643.0*     CBG: Recent Labs  Lab 02/07/17 1852  GLUCAP 144*    Recent Results (from the past 240 hour(s))  MRSA PCR Screening     Status: Abnormal   Collection Time: 02/07/17  6:45 PM  Result Value Ref Range Status   MRSA by PCR POSITIVE (A) NEGATIVE Final    Comment:        The GeneXpert MRSA Assay (FDA approved for NASAL specimens only), is one component of a comprehensive MRSA colonization surveillance program. It is not intended to diagnose MRSA infection nor to guide or monitor treatment for MRSA infections. RESULT CALLED TO, READ BACK BY AND VERIFIED WITH: MEGAN FOACK ON 02/07/17 AT 2018  JAG Performed at [facility], [address] [place], [place] [address]   Blood Culture (routine x 2)     Status: None (Preliminary result)   Collection Time: 02/07/17  7:18 PM  Result Value Ref Range Status   Specimen Description BLOOD LEFT HAND  Final   Special Requests   Final    BOTTLES DRAWN AEROBIC AND ANAEROBIC Blood Culture results may not be optimal due to an inadequate volume of blood received in culture bottles   Culture   Final    NO GROWTH 2 DAYS Performed at [facility], [address] [place], [place] [address]    Report Status PENDING  Incomplete  Blood Culture (routine x 2)     Status: None (Preliminary result)   Collection Time: 02/07/17  8:52 PM  Result Value Ref Range Status   Specimen Description BLOOD RIGHT ANTECUBITAL  Final   Special Requests   Final    BOTTLES DRAWN AEROBIC AND ANAEROBIC Blood Culture adequate volume   Culture   Final    NO GROWTH 2 DAYS Performed at [facility], [address] [place], [place] [address]    Report Status PENDING  Incomplete  Body fluid culture     Status: None (Preliminary result)   Collection Time: 02/08/17 10:15 AM  Result Value Ref Range Status   Specimen Description   Final    PLEURAL Performed at [facility], [address], [place], [place] [address]    Special Requests   Final    NONE Performed at [facility], [address] [place], [place] [address]    Gram Stain   Final    FEW WBC PRESENT,BOTH PMN AND MONONUCLEAR NO ORGANISMS SEEN    Culture   Final    NO GROWTH 2 DAYS Performed at [facility], [place] [address] [place], [place] [address]    Report Status PENDING  Incomplete     Studies: Dg Chest 1 View  Result Date: 02/08/2017 CLINICAL DATA:  Status post left thoracentesis. EXAM: CHEST 1 VIEW COMPARISON:  Radiographs of February 07, 2017. FINDINGS: Mild cardiomegaly is noted. No pneumothorax is noted. Left pleural effusion is  significantly smaller status post thoracentesis. Atherosclerosis of thoracic aorta is noted. Mild left basilar atelectasis and mild left pleural effusion remains. Mild right pleural effusion is noted with associated atelectasis which is improved compared to prior exam. IMPRESSION: Significantly improved left pleural effusion is noted status post thoracentesis. No pneumothorax is noted. Electronically Signed   By: Marijo Conception, M.D.   On: 02/08/2017 10:59  US Thoracentesis Asp Pleural Space W/img Guide  Result Date: 02/08/2017 INDICATION: Shortness of breath. Recurrent pleural effusions. Request for diagnostic and therapeutic thoracentesis EXAM: ULTRASOUND GUIDED LEFT THORACENTESIS MEDICATIONS: None. COMPLICATIONS: None immediate.  Post procedure CXR negative for pneumothorax. PROCEDURE: An ultrasound guided thoracentesis was thoroughly discussed with the patient and questions answered. The benefits, risks, alternatives and complications were also discussed. The patient understands and wishes to proceed with the procedure. Written consent was obtained. Ultrasound was performed to localize and mark an adequate pocket of fluid in the left chest. The area was then prepped and draped in the normal sterile fashion. 1% Lidocaine was used for local anesthesia. Under ultrasound guidance a Safe-T-Centesis catheter was introduced. Thoracentesis was performed. The catheter was removed and a dressing applied. FINDINGS: A total of approximately 1.1 L of clear, dark yellow fluid was removed. Samples were sent to the laboratory as requested by the clinical team. IMPRESSION: Successful ultrasound guided left thoracentesis yielding 1.1 L of pleural fluid. Read by: Ascencion Dike PA-C Electronically Signed   By: Markus Daft M.D.   On: 02/08/2017 10:58    Scheduled Meds: . allopurinol  100 mg Oral Daily  . apixaban  2.5 mg Oral BID  . aspirin EC  81 mg Oral Daily  . bisoprolol  5 mg Oral Daily  . Chlorhexidine Gluconate  Cloth  6 each Topical Q0600  . escitalopram  10 mg Oral BID  . feeding supplement (ENSURE ENLIVE)  237 mL Oral TID WC  . furosemide  40 mg Intravenous BID  . montelukast  10 mg Oral QHS  . mupirocin ointment  1 application Nasal BID  . sodium chloride flush  3 mL Intravenous Q12H   Continuous Infusions: . sodium chloride      Assessment/Plan:  1. Acute on chronic respiratory failure with hypoxia.  Due to acute on chronic systolic congestive heart failure and bilateral pleural effusions.  Improving 2. Bilateral pleural effusions.  Status post thoracentesis 1.1 L on the left.  Repeat chest x-ray shows improving right pleural effusion with diuresis. 3. Acute on chronic systolic congestive heart failure.  Continue diuresis with IV Lasix.  Still is fluid overloaded.  Monitor renal function.  Discussed with Dr. Ubaldo Glassing of cardiology. 4. Atrial fibrillation on Eliquis.  Rate controlled with bisoprolol. 5. History of gout.  Uric acid elevated at 13.6.  Started low-dose allopurinol 6. History of CAD.  On aspirin and bisoprolol   Code Status: DNR/DNI     Code Status Orders  (From admission, onward)        Start     Ordered   02/08/17 1305  Full code  Continuous     02/08/17 1304    Code Status History    Date Active Date Inactive Code Status Order ID Comments User Context   02/07/2017 18:59 02/08/2017 13:04 DNR [id number]  Demetrios Loll, MD Inpatient   01/21/2017 20:00 01/25/2017 19:34 DNR [id number]  Vaughan Basta, MD Inpatient   01/12/2017 12:56 01/17/2017 23:00 Full Code [id number]  Demetrios Loll, MD Inpatient   05/20/2016 00:47 05/21/2016 17:02 Full Code [id number]  Lance Coon, MD Inpatient   05/15/2016 12:34 05/17/2016 18:08 Full Code [id number]  Fritzi Mandes, MD Inpatient   05/05/2016 03:31 05/06/2016 15:28 Full Code [id number]  Wellington Hampshire, MD Inpatient   04/26/2016 17:12 05/01/2016 03:49 Full Code [id number]  Nicholes Mango, MD Inpatient   04/26/2016 14:08 04/26/2016 17:12 Full Code [id number]   Isaias Cowman, MD Inpatient   09/23/2011 14:11 09/26/2011 16:43 Full  Code [id number]  Flippin, Lurlean Nanny, RN Inpatient    Advance Directive Documentation     Most Recent Value  Type of Advance Directive  Out of facility DNR (pink MOST or yellow form)  Pre-existing out of facility DNR order (yellow form or pink MOST form)  No data  "MOST" Form in Place?  No data     Family Communication: Spoke with family at bedside Disposition Plan: SNF  Consultants:  Consult cardiology  Time spent: 25 minutes  [place]

## 2017-02-10 NOTE — Progress Notes (Signed)
Patient ID: Wanda Davenport, female   DOB: [birth year], 84 y.o.   MRN: [id number]  [employer] PROGRESS NOTE  Wanda Davenport YBO:[id number] DOB: Aug 05, 1923 DOA: 02/07/2017 PCP: Mellody Dance, DO  HPI/Subjective:  On 3 L O2. Daughter at bedside.   Afebrile Feels weak  Objective: Vitals:   02/10/17 0619 02/10/17 1311  BP: (!) 101/58 (!) 110/48  Pulse: 65 66  Resp: 18 (!) 24  Temp: 97.6 F (36.4 C)   SpO2: 100% 97%    Filed Weights   02/08/17 0430 02/09/17 0452 02/10/17 0500  Weight: 72.1 kg (158 lb 15.2 oz) 72.5 kg (159 lb 14.4 oz) 72.7 kg (160 lb 3.2 oz)    ROS: Review of Systems  Constitutional: Negative for chills and fever.  Eyes: Negative for blurred vision.  Respiratory: Negative for cough and shortness of breath.   Cardiovascular: Negative for chest pain.  Gastrointestinal: Negative for abdominal pain, constipation, diarrhea, nausea and vomiting.  Genitourinary: Negative for dysuria.  Musculoskeletal: Negative for joint pain.  Neurological: Negative for dizziness and headaches.   Exam: Physical Exam  Constitutional: She is oriented to person, place, and time.  HENT:  Nose: No mucosal edema.  Mouth/Throat: No oropharyngeal exudate or posterior oropharyngeal edema.  Eyes: Conjunctivae, EOM and lids are normal. Pupils are equal, round, and reactive to light.  Neck: No JVD present. Carotid bruit is not present. No edema present. No thyroid mass and no thyromegaly present.  Cardiovascular: S1 normal and S2 normal. Exam reveals no gallop.  No murmur heard. Pulses:      Dorsalis pedis pulses are 2+ on the right side, and 2+ on the left side.  Respiratory: No respiratory distress. She has decreased breath sounds in the right middle field, the right lower field and the left lower field. She has no wheezes. She has rhonchi in the right middle field, the right lower field and the left lower field. She has no rales.  GI: Soft. Bowel sounds are normal. There is no  tenderness.  Musculoskeletal:       Right ankle: She exhibits swelling.       Left ankle: She exhibits swelling.  Lymphadenopathy:    She has no cervical adenopathy.  Neurological: She is alert and oriented to person, place, and time. No cranial nerve deficit.  Skin: Skin is warm. No rash noted. Nails show no clubbing.  Psychiatric: She has a normal mood and affect.      Data Reviewed: Basic Metabolic Panel: Recent Labs  Lab 02/07/17 1603 02/07/17 1918 02/08/17 0619 02/09/17 0557 02/10/17 0525  NA 137  --  139 138 139  K 3.6  --  3.8 3.6 3.7  CL 100*  --  100* 99* 100*  CO2 25  --  27 25 28   GLUCOSE 141*  --  117* 103* 105*  BUN 45*  --  46* 57* 59*  CREATININE 1.43*  --  1.48* 1.60* 1.36*  CALCIUM 10.0  --  10.0 9.9 9.8  MG  --  2.2  --   --   --    Liver Function Tests: Recent Labs  Lab 02/07/17 1603  AST 30  ALT 17  ALKPHOS 92  BILITOT 1.1  PROT 6.7  ALBUMIN 3.7   CBC: Recent Labs  Lab 02/07/17 1500 02/08/17 0619  WBC 7.0 8.1  NEUTROABS 6.6*  --   HGB 12.6 10.8*  HCT 41.3 34.2*  MCV 71.7* 69.9*  PLT 234 233   Cardiac Enzymes: Recent Labs  Lab 02/07/17 1502  TROPONINI 0.03*   BNP (last 3 results) Recent Labs    05/19/16 2209 01/12/17 1023 02/07/17 1502  BNP 1,575.0* 2,508.0* 2,643.0*     CBG: Recent Labs  Lab 02/07/17 1852  GLUCAP 144*    Recent Results (from the past 240 hour(s))  MRSA PCR Screening     Status: Abnormal   Collection Time: 02/07/17  6:45 PM  Result Value Ref Range Status   MRSA by PCR POSITIVE (A) NEGATIVE Final    Comment:        The GeneXpert MRSA Assay (FDA approved for NASAL specimens only), is one component of a comprehensive MRSA colonization surveillance program. It is not intended to diagnose MRSA infection nor to guide or monitor treatment for MRSA infections. RESULT CALLED TO, READ BACK BY AND VERIFIED WITH: MEGAN FOACK ON 02/07/17 AT 2018 JAG Performed at [facility], [address] [place], [place] [address]   Blood Culture (routine x 2)     Status: None (Preliminary result)   Collection Time: 02/07/17  7:18 PM  Result Value Ref Range Status   Specimen Description BLOOD LEFT HAND  Final   Special Requests   Final    BOTTLES DRAWN AEROBIC AND ANAEROBIC Blood Culture results may not be optimal due to an inadequate volume of blood received in culture bottles   Culture   Final    NO GROWTH 2 DAYS Performed at [facility], [address] [place], [place] [address]    Report Status PENDING  Incomplete  Blood Culture (routine x 2)     Status: None (Preliminary result)   Collection Time: 02/07/17  8:52 PM  Result Value Ref Range Status   Specimen Description BLOOD RIGHT ANTECUBITAL  Final   Special Requests   Final    BOTTLES DRAWN AEROBIC AND ANAEROBIC Blood Culture adequate volume   Culture   Final    NO GROWTH 2 DAYS Performed at [facility], [address], [place], [place] [address]    Report Status PENDING  Incomplete  Body fluid culture     Status: None (Preliminary result)   Collection Time: 02/08/17 10:15 AM  Result Value Ref Range Status   Specimen Description   Final    PLEURAL Performed at [facility], [address], [place], [place] [address]    Special Requests   Final    NONE Performed at [facility], [address] [place], [place] [address]    Gram Stain   Final    FEW WBC PRESENT,BOTH PMN AND MONONUCLEAR NO ORGANISMS SEEN    Culture   Final    NO GROWTH 2 DAYS Performed at [facility], [place] [address], [place], [place] [address]    Report Status PENDING  Incomplete     Studies: No results found.  Scheduled Meds: . allopurinol  100 mg Oral Daily  . apixaban  2.5 mg Oral BID  . aspirin EC  81 mg Oral Daily  . bisoprolol  5 mg Oral Daily  . Chlorhexidine Gluconate Cloth  6 each Topical Q0600  . escitalopram  10 mg Oral BID  . feeding supplement (ENSURE ENLIVE)  237 mL Oral  TID WC  . furosemide  40 mg Intravenous BID  . montelukast  10 mg Oral QHS  . mupirocin ointment  1 application Nasal BID  . sodium chloride flush  3 mL Intravenous Q12H   Continuous Infusions: . sodium chloride  Assessment/Plan:  1. Acute on chronic respiratory failure with hypoxia.  Due to acute on chronic systolic congestive heart failure and bilateral pleural effusions.  Improving 2. Bilateral pleural effusions.  Status post thoracentesis 1.1 L on the left.  Repeat chest x-ray shows improving right pleural effusion with diuresis. 3. Acute on chronic systolic congestive heart failure.  Continue diuresis with IV Lasix.  Still is fluid overloaded.  Monitor renal function.  Discussed with Dr. Ubaldo Glassing of cardiology. 4. Atrial fibrillation on Eliquis.  Rate controlled with bisoprolol. 5. History of gout.  Uric acid elevated at 13.6.  Started low-dose allopurinol 6. History of CAD.  On aspirin and bisoprolol  Likely d/c in AM   Code Status: DNR/DNI     Code Status Orders  (From admission, onward)        Start     Ordered   02/08/17 1305  Full code  Continuous     02/08/17 1304    Code Status History    Date Active Date Inactive Code Status Order ID Comments User Context   02/07/2017 18:59 02/08/2017 13:04 DNR [id number]  Demetrios Loll, MD Inpatient   01/21/2017 20:00 01/25/2017 19:34 DNR [id number]  Vaughan Basta, MD Inpatient   01/12/2017 12:56 01/17/2017 23:00 Full Code [id number]  Demetrios Loll, MD Inpatient   05/20/2016 00:47 05/21/2016 17:02 Full Code [id number]  Lance Coon, MD Inpatient   05/15/2016 12:34 05/17/2016 18:08 Full Code [id number]  Fritzi Mandes, MD Inpatient   05/05/2016 03:31 05/06/2016 15:28 Full Code [id number]  Wellington Hampshire, MD Inpatient   04/26/2016 17:12 05/01/2016 03:49 Full Code [id number]  Nicholes Mango, MD Inpatient   04/26/2016 14:08 04/26/2016 17:12 Full Code [id number]  Isaias Cowman, MD Inpatient   09/23/2011 14:11 09/26/2011 16:43 Full Code [id number]   Willette Pa, RN Inpatient    Advance Directive Documentation     Most Recent Value  Type of Advance Directive  Out of facility DNR (pink MOST or yellow form)  Pre-existing out of facility DNR order (yellow form or pink MOST form)  No data  "MOST" Form in Place?  No data     Family Communication: Spoke with family at bedside Disposition Plan: SNF  Consultants:  Consult cardiology  Time spent: 25 minutes  [place]

## 2017-02-10 NOTE — Progress Notes (Signed)
Advance care planning  Met with patient and daughter at bedside.  We discussed regarding patient's acute on chronic systolic congestive heart failure, respiratory failure and bilateral pleural effusions.  Patient is status post left thoracentesis of 1.1 L with improvement.  Explained that there is a high likelihood the pleural effusion will return.  Will need higher dose of Lasix at home to prevent this from happening.  But am concerned it would only postpone the reaccumulation of fluid.  Options would be a Pleurx catheter in the future although at her age she would not be a candidate for aggressive measures.  We discussed about dialysis and patient and family did not want hemodialysis if any worsening renal function.  CODE STATUS is DNR/DNI.  Time spent 20 minutes

## 2017-02-10 NOTE — Clinical Social Work Note (Signed)
Cardiology stated that patient and daughter are now interested in hospice with the possibility of patient returning as outpatient to receive palliative thoracentesis. Palliative will need to see patient and daughter to determine if patient wishes to pursue this option. Shela Leff MSW,LCSW [phone]

## 2017-02-11 ENCOUNTER — Other Ambulatory Visit: Payer: Self-pay | Admitting: Family

## 2017-02-11 LAB — BASIC METABOLIC PANEL
ANION GAP: 12 (ref 5–15)
BUN: 59 mg/dL — AB (ref 6–20)
CO2: 29 mmol/L (ref 22–32)
Calcium: 9.9 mg/dL (ref 8.9–10.3)
Chloride: 99 mmol/L — ABNORMAL LOW (ref 101–111)
Creatinine, Ser: 1.17 mg/dL — ABNORMAL HIGH (ref 0.44–1.00)
GFR calc Af Amer: 45 mL/min — ABNORMAL LOW (ref >60)
GFR, EST NON AFRICAN AMERICAN: 39 mL/min — AB (ref >60)
Glucose, Bld: 111 mg/dL — ABNORMAL HIGH (ref 65–99)
POTASSIUM: 3.8 mmol/L (ref 3.5–5.1)
Sodium: 140 mmol/L (ref 135–145)

## 2017-02-11 LAB — BODY FLUID CULTURE: CULTURE: NO GROWTH

## 2017-02-11 MED ORDER — MORPHINE SULFATE (CONCENTRATE) 10 MG/0.5ML PO SOLN
5.0000 mg | ORAL | Status: DC | PRN
  Administered 2017-02-11: 5 mg via ORAL
  Filled 2017-02-11: qty 1

## 2017-02-11 NOTE — Progress Notes (Signed)
New referral for [facility] services at home received from [facility]. Patient is a 84 year old woman admitted with acute respiratory failure requiring BIPAP, thoracentesis performed on 2/8. Palliative medicine met with patient and family at that time they were more focused on treatment. They have now decided to have patient return home with hospice services. Writer met in the room with patient, her son Herbie [place], daughter in law and other extended family to initiate education regarding hospice services, philosophy and team approach to care with understanding voiced. Patient does require DME to be in place prior to discharge. DME ordered, family requested DME to be delivered on 2/12, before 12 pm.  Patient was alert and did attempt to participate in the conversation, she is hear of hearing. Questions answered. Patient information faxed to referral. Hospice information and contact numbers left with Herbie [place]. Plan is for discharge home via EMS tomorrow after DME is in place. Hospital care team updated. Will continue to follow through discharge. Flo Shanks RN, BSN, [place] [facility], hospital Liaison [phone]

## 2017-02-11 NOTE — Care Management (Signed)
Patient admitted with respiratory failure.  Patient admitted from [facility], however will be discharging Home with Hospice.  Son, daughter in law, and sister at bedside.  Son states that family will be staying with patient after discharge.  Home hospice agency preference provided.  [facility].  Referral made to [facility] with Hospice and [place].  Patient will require hospital bed and bsc.  To be delivered to home 02/12/17

## 2017-02-11 NOTE — Progress Notes (Signed)
Patient ID: Wanda Davenport, female   DOB: [birth year], 84 y.o.   MRN: [id number]  [facility] PROGRESS NOTE  YVONNIA TANGO KNL:[id number] DOB: 10-30-1923 DOA: 02/07/2017 PCP: Mellody Dance, DO  HPI/Subjective:  Patient was very drowzy in the morning. I checked on her later she is better and eating. Still has SOB  Objective: Vitals:   02/10/17 2208 02/11/17 0536  BP: (!) 97/40 (!) 110/49  Pulse: 66 65  Resp: (!) 22   Temp: 97.8 F (36.6 C) (!) 97.5 F (36.4 C)  SpO2: 98% 97%    Filed Weights   02/09/17 0452 02/10/17 0500 02/11/17 0536  Weight: 72.5 kg (159 lb 14.4 oz) 72.7 kg (160 lb 3.2 oz) 72.6 kg (160 lb 1.6 oz)    ROS: Review of Systems  Unable to perform ROS: Mental status change  Constitutional: Negative for chills and fever.  Eyes: Negative for blurred vision.  Respiratory: Negative for cough and shortness of breath.   Cardiovascular: Negative for chest pain.  Gastrointestinal: Negative for abdominal pain, constipation, diarrhea, nausea and vomiting.  Genitourinary: Negative for dysuria.  Musculoskeletal: Negative for joint pain.  Neurological: Negative for dizziness and headaches.   Exam: Physical Exam  Constitutional: She is oriented to person, place, and time.  HENT:  Nose: No mucosal edema.  Mouth/Throat: No oropharyngeal exudate or posterior oropharyngeal edema.  Eyes: Conjunctivae, EOM and lids are normal. Pupils are equal, round, and reactive to light.  Neck: No JVD present. Carotid bruit is not present. No edema present. No thyroid mass and no thyromegaly present.  Cardiovascular: S1 normal and S2 normal. Exam reveals no gallop.  No murmur heard. Pulses:      Dorsalis pedis pulses are 2+ on the right side, and 2+ on the left side.  Respiratory: No respiratory distress. She has decreased breath sounds in the right middle field, the right lower field and the left lower field. She has no wheezes. She has rhonchi in the right middle field, the right  lower field and the left lower field. She has no rales.  GI: Soft. Bowel sounds are normal. There is no tenderness.  Musculoskeletal:       Right ankle: She exhibits swelling.       Left ankle: She exhibits swelling.  Lymphadenopathy:    She has no cervical adenopathy.  Neurological: She is alert and oriented to person, place, and time. No cranial nerve deficit.  Skin: Skin is warm. No rash noted. Nails show no clubbing.  Psychiatric: She has a normal mood and affect.      Data Reviewed: Basic Metabolic Panel: Recent Labs  Lab 02/07/17 1603 02/07/17 1918 02/08/17 0619 02/09/17 0557 02/10/17 0525 02/11/17 0506  NA 137  --  139 138 139 140  K 3.6  --  3.8 3.6 3.7 3.8  CL 100*  --  100* 99* 100* 99*  CO2 25  --  27 25 28 29   GLUCOSE 141*  --  117* 103* 105* 111*  BUN 45*  --  46* 57* 59* 59*  CREATININE 1.43*  --  1.48* 1.60* 1.36* 1.17*  CALCIUM 10.0  --  10.0 9.9 9.8 9.9  MG  --  2.2  --   --   --   --    Liver Function Tests: Recent Labs  Lab 02/07/17 1603  AST 30  ALT 17  ALKPHOS 92  BILITOT 1.1  PROT 6.7  ALBUMIN 3.7   CBC: Recent Labs  Lab 02/07/17 1500 02/08/17  0619  WBC 7.0 8.1  NEUTROABS 6.6*  --   HGB 12.6 10.8*  HCT 41.3 34.2*  MCV 71.7* 69.9*  PLT 234 233   Cardiac Enzymes: Recent Labs  Lab 02/07/17 1502  TROPONINI 0.03*   BNP (last 3 results) Recent Labs    05/19/16 2209 01/12/17 1023 02/07/17 1502  BNP 1,575.0* 2,508.0* 2,643.0*     CBG: Recent Labs  Lab 02/07/17 1852  GLUCAP 144*    Recent Results (from the past 240 hour(s))  MRSA PCR Screening     Status: Abnormal   Collection Time: 02/07/17  6:45 PM  Result Value Ref Range Status   MRSA by PCR POSITIVE (A) NEGATIVE Final    Comment:        The GeneXpert MRSA Assay (FDA approved for NASAL specimens only), is one component of a comprehensive MRSA colonization surveillance program. It is not intended to diagnose MRSA infection nor to guide or monitor treatment  for MRSA infections. RESULT CALLED TO, READ BACK BY AND VERIFIED WITH: MEGAN FOACK ON 02/07/17 AT 2018 JAG Performed at [facility], [address] [place], [place] [address]   Blood Culture (routine x 2)     Status: None (Preliminary result)   Collection Time: 02/07/17  7:18 PM  Result Value Ref Range Status   Specimen Description BLOOD LEFT HAND  Final   Special Requests   Final    BOTTLES DRAWN AEROBIC AND ANAEROBIC Blood Culture results may not be optimal due to an inadequate volume of blood received in culture bottles   Culture   Final    NO GROWTH 4 DAYS Performed at [facility], [address], [place], [place] [address]    Report Status PENDING  Incomplete  Blood Culture (routine x 2)     Status: None (Preliminary result)   Collection Time: 02/07/17  8:52 PM  Result Value Ref Range Status   Specimen Description BLOOD RIGHT ANTECUBITAL  Final   Special Requests   Final    BOTTLES DRAWN AEROBIC AND ANAEROBIC Blood Culture adequate volume   Culture   Final    NO GROWTH 4 DAYS Performed at [facility], [address] [place], [place] [address]    Report Status PENDING  Incomplete  Body fluid culture     Status: None (Preliminary result)   Collection Time: 02/08/17 10:15 AM  Result Value Ref Range Status   Specimen Description   Final    PLEURAL Performed at [facility], [address] [place], [place] [address]    Special Requests   Final    NONE Performed at [facility], [address] [place], [place] [address]    Gram Stain   Final    FEW WBC PRESENT,BOTH PMN AND MONONUCLEAR NO ORGANISMS SEEN    Culture   Final    NO GROWTH 3 DAYS Performed at [facility], [place] [address], [place], [place] [address]    Report Status PENDING  Incomplete     Studies: No results found.  Scheduled Meds: . allopurinol  100 mg Oral Daily  . apixaban  2.5 mg Oral BID  . aspirin EC  81 mg Oral Daily  . bisoprolol   5 mg Oral Daily  . Chlorhexidine Gluconate Cloth  6 each Topical Q0600  . escitalopram  10 mg Oral BID  . feeding supplement (ENSURE ENLIVE)  237 mL Oral TID WC  . furosemide  40 mg Intravenous BID  .  montelukast  10 mg Oral QHS  . mupirocin ointment  1 application Nasal BID  . sodium chloride flush  3 mL Intravenous Q12H   Continuous Infusions: . sodium chloride      Assessment/Plan:  * Acute on chronic hypoxic respiratory failure *Acute on chronic systolic congestive heart failure *Recurrent bilateral pleural effusions *Atrial fibrillation *CAD *Urinary retention  Recurrent pleural effusions in spite of thoracentesis and diuresis.  Discussed with son at bedside.  Son and patient have decided about home hospice.  Will start morphine concentrate as needed.  Continue Foley catheter.  Discussed with case manager and hospice liaison.  Likely d/c in AM  Code Status: DNR/DNI     Code Status Orders  (From admission, onward)        Start     Ordered   02/08/17 1305  Full code  Continuous     02/08/17 1304    Code Status History    Date Active Date Inactive Code Status Order ID Comments User Context   02/07/2017 18:59 02/08/2017 13:04 DNR [id number]  Demetrios Loll, MD Inpatient   01/21/2017 20:00 01/25/2017 19:34 DNR [id number]  Vaughan Basta, MD Inpatient   01/12/2017 12:56 01/17/2017 23:00 Full Code [id number]  Demetrios Loll, MD Inpatient   05/20/2016 00:47 05/21/2016 17:02 Full Code [id number]  Lance Coon, MD Inpatient   05/15/2016 12:34 05/17/2016 18:08 Full Code [id number]  Fritzi Mandes, MD Inpatient   05/05/2016 03:31 05/06/2016 15:28 Full Code [id number]  Wellington Hampshire, MD Inpatient   04/26/2016 17:12 05/01/2016 03:49 Full Code [id number]  Nicholes Mango, MD Inpatient   04/26/2016 14:08 04/26/2016 17:12 Full Code [id number]  Isaias Cowman, MD Inpatient   09/23/2011 14:11 09/26/2011 16:43 Full Code [id number]  Willette Pa, RN Inpatient    Advance Directive Documentation      Most Recent Value  Type of Advance Directive  Out of facility DNR (pink MOST or yellow form)  Pre-existing out of facility DNR order (yellow form or pink MOST form)  No data  "MOST" Form in Place?  No data     Family Communication: Spoke with family at bedside Disposition Plan: Home with hospice  Consultants:  Consult cardiology  Time spent: 25 minutes  [place]

## 2017-02-11 NOTE — Care Management Important Message (Signed)
Important Message  Patient Details  Name: Wanda Davenport MRN: [id number] Date of Birth: [birth year]   Medicare Important Message Given:  Yes    Beverly Sessions, RN 02/11/2017, 3:41 PM

## 2017-02-12 LAB — CULTURE, BLOOD (ROUTINE X 2)
CULTURE: NO GROWTH
CULTURE: NO GROWTH
Special Requests: ADEQUATE

## 2017-02-12 LAB — BASIC METABOLIC PANEL
ANION GAP: 10 (ref 5–15)
BUN: 57 mg/dL — ABNORMAL HIGH (ref 6–20)
CHLORIDE: 97 mmol/L — AB (ref 101–111)
CO2: 32 mmol/L (ref 22–32)
Calcium: 9.8 mg/dL (ref 8.9–10.3)
Creatinine, Ser: 1.1 mg/dL — ABNORMAL HIGH (ref 0.44–1.00)
GFR calc Af Amer: 49 mL/min — ABNORMAL LOW (ref >60)
GFR, EST NON AFRICAN AMERICAN: 42 mL/min — AB (ref >60)
Glucose, Bld: 114 mg/dL — ABNORMAL HIGH (ref 65–99)
POTASSIUM: 3.7 mmol/L (ref 3.5–5.1)
Sodium: 139 mmol/L (ref 135–145)

## 2017-02-12 MED ORDER — MORPHINE SULFATE (CONCENTRATE) 10 MG/0.5ML PO SOLN: 5.0000 mg | ORAL | 0 refills | Status: AC | PRN

## 2017-02-12 MED ORDER — TORSEMIDE 20 MG PO TABS: 60.0000 mg | ORAL_TABLET | Freq: Two times a day (BID) | ORAL | 0 refills | Status: AC

## 2017-02-12 NOTE — Progress Notes (Signed)
Discharge order received. Patient is alert and oriented. Vital signs stable . No signs of acute distress. Discharge instructions given. Patient verbalized understanding. No other issues noted at this time.   

## 2017-02-12 NOTE — Progress Notes (Signed)
Follow up visit made to new referral for [facility]. Patient seen sleeping soundly, son at bedside, reports DME to be delivered in the next 20 minutes. Hospital staff updated. Signed DNR and prescriptions in place in patient's chart. EMS to transport home. Will send updated notes to referral when posted.  Flo Shanks RN, BSN, [facility], hospital Liaison [phone]

## 2017-02-12 NOTE — Care Management Note (Signed)
Case Management Note  Patient Details  Name: Wanda Davenport MRN: [id number] Date of Birth: 16-Sep-1923   Patient discharged with home hospice.  Equipment was delivered at home. EMS packet and DNR placed on chart.   Subjective/Objective:                    Action/Plan:   Expected Discharge Date:  02/12/17               Expected Discharge Plan:  Home w Hospice Care  In-House Referral:     Discharge planning Services  CM Consult  Post Acute Care Choice:  Hospice Choice offered to:  Adult Children  DME Arranged:  Bedside commode, Hospital bed DME Agency:     HH Arranged:    HH Agency:  [facility]  Status of Service:  Completed, signed off  If discussed at [place] of Stay Meetings, dates discussed:    Additional Comments:  Beverly Sessions, RN 02/12/2017, 1:56 PM

## 2017-02-13 ENCOUNTER — Encounter: Payer: Self-pay | Admitting: *Deleted

## 2017-02-13 ENCOUNTER — Other Ambulatory Visit: Payer: Self-pay | Admitting: *Deleted

## 2017-02-13 NOTE — Patient Outreach (Signed)
[facility]) Care Management  02/13/2017  Wanda Davenport 1923/09/22 [id number]     CSW contacted patient at her home today after being advised she was released from SNF.   CSW spoke with patient's daughter, [name], who is at the home with patient.  "she has Hospice now". Per daughter, she was able to come be with her and Hospice is involved and assisting with all her needs.  "Mom thought a lot of yall".  CSW discussed plans to close [facility] referral and assured her the hospice services will be of great support.  CSW offered support and admiration for family support.  CSW advised plans for case closure and will update PCP and [facility] team.   [facility] CM Care Plan Problem One     Most Recent Value  Care Plan Problem One  Level of care issues.  Role Documenting the Problem One  Clinical Social Worker  Care Plan for Problem One  Active  [facility] Long Term Goal   Patient will have home health services and durable medical equipment in place at time of discharge from the skilled nursing facility, within the next 40 days.  [facility] Long Term Goal Start Date  01/21/17  [facility] Long Term Goal Met Date  02/13/17  Interventions for Problem One Long Term Goal  CSW will assist patient and discharge planning coordinator at the skilled nursing facility with arranging home health services and durable medical equipment.  [facility] CM Short Term Goal #1   Patient will decide on a home health agency of choice, with regards to home care services, within the next three weeks.  [facility] CM Short Term Goal #1 Start Date  01/21/17  Interventions for Short Term Goal #1  CSW will provide patient with a list of home health agencies offering home care services.  [facility] CM Short Term Goal #2   Patient will decide on an agency of choice, with regards to durable medical equipment, within the next three weeks.  [facility] CM Short Term Goal #2 Start Date  01/21/17  [facility] CM Short Term Goal #2 Met Date  02/13/17  Interventions for Short Term Goal #2  CSW  will provide patient with a list of agencies offering durable medical equipment.    [facility] CM Care Plan Problem Two     Most Recent Value  Care Plan Problem Two  Patient with limited income.  Role Documenting the Problem Two  Clinical Social Worker  Care Plan for Problem Two  Active  Interventions for Problem Two Long Term Goal   CSW discussing and assisting with  Medicaid application  process.   [facility] Long Term Goal  Patient will have Medicaid determination in the next 90 days.   [facility] Long Term Goal Start Date  01/28/17  [facility] Long Term Goal Met Date  02/13/17     Eduard Clos, MSW, [place] Worker  [place] [phone]

## 2017-02-14 LAB — FUNGUS CULTURE WITH STAIN

## 2017-02-14 LAB — FUNGUS CULTURE RESULT

## 2017-02-14 LAB — FUNGAL ORGANISM REFLEX

## 2017-02-15 ENCOUNTER — Ambulatory Visit: Payer: Medicare HMO | Admitting: Family

## 2017-02-15 NOTE — Discharge Summary (Signed)
[place] [facility] [place] NAME: Wanda Davenport    MR#:  [id number]  DATE OF BIRTH:  01-05-1923  DATE OF ADMISSION:  02/07/2017 ADMITTING PHYSICIAN: Demetrios Loll, MD  DATE OF DISCHARGE: 02/12/2017 12:44 PM  PRIMARY CARE PHYSICIAN: Mellody Dance, DO   ADMISSION DIAGNOSIS:  Acute respiratory distress [R06.03] Pleural effusion [J90]  DISCHARGE DIAGNOSIS:  Active Problems:   Acute on chronic respiratory failure with hypoxia ([place])   SECONDARY DIAGNOSIS:   Past Medical History:  Diagnosis Date  . Allergic rhinitis   . Anxiety   . Arthritis   . Atypical chest pain   . Bronchitis 01/14/2016  . Chronic diastolic CHF (congestive heart failure) (HCC)    Echo 06/2010 EF 60-65%, no RWMAs, grade 1 diastolic dysfunction, mild LAE, PASP 52mmHg.  [place] Kitchen Coronary artery disease    MI  . DIVERTICULOSIS, COLON   . HEMORRHOIDS, INTERNAL   . Hepatic steatosis   . Hyperlipidemia   . HYPERTENSION   . LBBB (left bundle branch block)    a. present on ECG 06/2010  . Osteopenia   . Pre-diabetes    A1c 6.0     ADMITTING HISTORY  HISTORY OF PRESENT ILLNESS:  Wanda Davenport  is a 84 y.o. female with a known history of multiple medical problems as below.  The patient was sent from nursing home to the ED due to above chief complaints.  The patient is confused, on BiPAP.  According to her son and daughter-in-law, the patient has had worsening shortness of breath for the past 3-4 days.  She was just discharged from the hospital 1 week ago.  She has chronic respiratory failure and is put on BiPAP due to hypoxia.  Chest x-ray show CHF and possible pneumonia.  The patient is confused for her daughter-in-law.     HOSPITAL COURSE:   * Acute on chronic hypoxic respiratory failure *Acute on chronic systolic congestive heart failure *Recurrent bilateral pleural effusions *Atrial fibrillation *CAD *Urinary retention  Patient was admitted due to acute on chronic respiratory  failure on BiPAP support.  Patient was started on IV Lasix with which she diuresed well.  She received the most benefit from left thoracentesis.  Unfortunately this has been a recurrent problem with quick reaccumulation.  She is not a candidate for Pleurx catheter.  Due to worsening symptoms patient is thought to be a candidate for hospice at this time.  Discussed with patient and family and they have chosen to go home with hospice services.  Likely transition to comfort measures soon with worsening of her symptoms.  At this time patient is awake and eating and is able to carry conversations. Patient started on morphine as needed.  CONSULTS OBTAINED:  Treatment Team:  Teodoro Spray, MD  DRUG ALLERGIES:   Allergies  Allergen Reactions  . Fenofibrate Other (See Comments)    Myalgias/gi upset   . Statins Other (See Comments)    Myalgias   . Sulfonamide Derivatives Nausea And Vomiting    REACTION: nausea    DISCHARGE MEDICATIONS:   Allergies as of 02/12/2017      Reactions   Fenofibrate Other (See Comments)   Myalgias/gi upset   Statins Other (See Comments)   Myalgias   Sulfonamide Derivatives Nausea And Vomiting   REACTION: nausea      Medication List    STOP taking these medications   cephALEXin 500 MG capsule Commonly known as:  KEFLEX   predniSONE 20 MG tablet Commonly  known as:  DELTASONE     TAKE these medications   albuterol 108 (90 Base) MCG/ACT inhaler Commonly known as:  PROVENTIL HFA;VENTOLIN HFA Inhale 2 puffs into the lungs every 6 (six) hours as needed. Wheezing or shortness of breath   apixaban 5 MG Tabs tablet Commonly known as:  ELIQUIS Take 1 tablet (5 mg total) by mouth 2 (two) times daily.   aspirin 81 MG tablet Take by mouth daily.   bisoprolol 10 MG tablet Commonly known as:  ZEBETA Take 5 mg by mouth daily.   CALAZIME SKIN PROTECTANT EX Apply 1 application topically as needed.   clonazepam 0.125 MG disintegrating tablet Commonly known  as:  KLONOPIN Take 1 tablet (0.125 mg total) by mouth 2 (two) times daily as needed (anxiety).   docusate sodium 100 MG capsule Commonly known as:  COLACE Take 1 capsule (100 mg total) by mouth 2 (two) times daily as needed for mild constipation.   escitalopram 10 MG tablet Commonly known as:  LEXAPRO Take 1 tablet (10 mg total) by mouth 2 (two) times daily.   feeding supplement (ENSURE ENLIVE) Liqd Take 237 mLs by mouth 3 (three) times daily with meals.   fluticasone 50 MCG/ACT nasal spray Commonly known as:  FLONASE Place 2 sprays into both nostrils daily. Nasal congestion Rarely used   ipratropium-albuterol 0.5-2.5 (3) MG/3ML Soln Commonly known as:  DUONEB Take 3 mLs by nebulization 2 (two) times daily.   montelukast 10 MG tablet Commonly known as:  SINGULAIR Take 1 tablet (10 mg total) by mouth at bedtime.   morphine CONCENTRATE 10 MG/0.5ML Soln concentrated solution Take 0.25 mLs (5 mg total) by mouth every 4 (four) hours as needed for severe pain or shortness of breath.   nitroGLYCERIN 0.4 MG SL tablet Commonly known as:  NITROSTAT Place under the tongue every 5 (five) minutes as needed for chest pain.   polyethylene glycol packet Commonly known as:  MIRALAX / GLYCOLAX Take 17 g by mouth daily as needed for mild constipation.   torsemide 20 MG tablet Commonly known as:  DEMADEX Take 3 tablets (60 mg total) by mouth 2 (two) times daily. What changed:  how much to take       Today   VITAL SIGNS:  Blood pressure (!) 105/55, pulse 66, temperature (!) 97.5 F (36.4 C), temperature source Oral, resp. rate 20, height 5\' 4"  (1.626 m), weight 72.3 kg (159 lb 8 oz), SpO2 98 %.  I/O:  No intake or output data in the 24 hours ending 02/15/17 1620  PHYSICAL EXAMINATION:  Physical Exam  GENERAL:  84 y.o.-year-old patient lying in the bed with no acute distress.  LUNGS: Normal breath sounds bilaterally, no wheezing, rales,rhonchi or crepitation. No use of accessory  muscles of respiration.  CARDIOVASCULAR: S1, S2 normal. No murmurs, rubs, or gallops.  ABDOMEN: Soft, non-tender, non-distended. Bowel sounds present. No organomegaly or mass.  NEUROLOGIC: Moves all 4 extremities. PSYCHIATRIC: The patient is alert and oriented x 3.  SKIN: No obvious rash, lesion, or ulcer.   DATA REVIEW:   CBC No results for input(s): WBC, HGB, HCT, PLT in the last 168 hours.  Chemistries  Recent Labs  Lab 02/12/17 0442  NA 139  K 3.7  CL 97*  CO2 32  GLUCOSE 114*  BUN 57*  CREATININE 1.10*  CALCIUM 9.8    Cardiac Enzymes No results for input(s): TROPONINI in the last 168 hours.  Microbiology Results  Results for orders placed or performed during  the hospital encounter of 02/07/17  MRSA PCR Screening     Status: Abnormal   Collection Time: 02/07/17  6:45 PM  Result Value Ref Range Status   MRSA by PCR POSITIVE (A) NEGATIVE Final    Comment:        The GeneXpert MRSA Assay (FDA approved for NASAL specimens only), is one component of a comprehensive MRSA colonization surveillance program. It is not intended to diagnose MRSA infection nor to guide or monitor treatment for MRSA infections. RESULT CALLED TO, READ BACK BY AND VERIFIED WITH: MEGAN FOACK ON 02/07/17 AT 2018 JAG Performed at [facility], [address] [place], [place] [address]   Blood Culture (routine x 2)     Status: None   Collection Time: 02/07/17  7:18 PM  Result Value Ref Range Status   Specimen Description BLOOD LEFT HAND  Final   Special Requests   Final    BOTTLES DRAWN AEROBIC AND ANAEROBIC Blood Culture results may not be optimal due to an inadequate volume of blood received in culture bottles   Culture   Final    NO GROWTH 5 DAYS Performed at [facility], [address] [place], [place] [address]    Report Status 02/12/2017 FINAL  Final  Blood Culture (routine x 2)     Status: None   Collection Time: 02/07/17  8:52 PM  Result Value Ref Range  Status   Specimen Description BLOOD RIGHT ANTECUBITAL  Final   Special Requests   Final    BOTTLES DRAWN AEROBIC AND ANAEROBIC Blood Culture adequate volume   Culture   Final    NO GROWTH 5 DAYS Performed at [facility], [address], [place], [place] [address]    Report Status 02/12/2017 FINAL  Final  Body fluid culture     Status: None   Collection Time: 02/08/17 10:15 AM  Result Value Ref Range Status   Specimen Description   Final    PLEURAL Performed at [facility], [address] [place], [place] [address]    Special Requests   Final    NONE Performed at [facility], [address] [place], [place] [address]    Gram Stain   Final    FEW WBC PRESENT,BOTH PMN AND MONONUCLEAR NO ORGANISMS SEEN    Culture   Final    NO GROWTH 3 DAYS Performed at [facility], [place] [address], [place], [place] [address]    Report Status 02/11/2017 FINAL  Final    RADIOLOGY:  No results found.  Follow up with PCP in 1 week.  Management plans discussed with the patient, family and they are in agreement.  CODE STATUS:  Code Status History    Date Active Date Inactive Code Status Order ID Comments User Context   02/08/2017 18:03 02/12/2017 15:51 DNR [id number]  Asencion Gowda, NP Inpatient   02/08/2017 13:04 02/08/2017 18:03 Full Code [id number]  Hermelinda Dellen, DO Inpatient   02/07/2017 18:59 02/08/2017 13:04 DNR [id number]  Demetrios Loll, MD Inpatient   01/21/2017 20:00 01/25/2017 19:34 DNR [id number]  Vaughan Basta, MD Inpatient   01/12/2017 12:56 01/17/2017 23:00 Full Code [id number]  Demetrios Loll, MD Inpatient   05/20/2016 00:47 05/21/2016 17:02 Full Code [id number]  Lance Coon, MD Inpatient   05/15/2016 12:34 05/17/2016 18:08 Full Code [id number]  Fritzi Mandes, MD Inpatient   05/05/2016 03:31 05/06/2016 15:28 Full Code [id number]  Wellington Hampshire, MD Inpatient   04/26/2016 17:12 05/01/2016 03:49 Full Code  [id number]  Nicholes Mango, MD Inpatient   04/26/2016 14:08  04/26/2016 17:12 Full Code [id number]  Isaias Cowman, MD Inpatient   09/23/2011 14:11 09/26/2011 16:43 Full Code [id number]  Willette Pa, RN Inpatient    Questions for Most Recent Historical Code Status (Order [id number])    Question Answer Comment   In the event of cardiac or respiratory ARREST Do not call a "code blue"    In the event of cardiac or respiratory ARREST Do not perform Intubation, CPR, defibrillation or ACLS    In the event of cardiac or respiratory ARREST Use medication by any route, position, wound care, and other measures to relive pain and suffering. May use oxygen, suction and manual treatment of airway obstruction as needed for comfort.    Comments MOST form completed         Advance Directive Documentation     Most Recent Value  Type of Advance Directive  Out of facility DNR (pink MOST or yellow form)  Pre-existing out of facility DNR order (yellow form or pink MOST form)  No data  "MOST" Form in Place?  No data      TOTAL TIME TAKING CARE OF THIS PATIENT ON DAY OF DISCHARGE: more than 30 minutes.   Leia Alf Amaury Kuzel M.D on 02/15/2017 at 4:20 PM  Between 7am to 6pm - Pager - [phone]  After 6pm go to [email or web address] - password [email or web address] [place] [place] [facility]  Office  [phone]  CC: Primary care physician; Mellody Dance, DO  Note: This dictation was prepared with Dragon dictation along with smaller phrase technology. Any transcriptional errors that result from this process are unintentional.

## 2017-03-01 NOTE — Progress Notes (Deleted)
Patient ID: Wanda Davenport, female    DOB: 11-16-1923, 84 y.o.   MRN: [id number]  HPI  Ms Mikulski is a 84 y/o female with a history of atypical chest pain, STEMI with stent, anxiety, diverticulosis, hepatic steatosis, hyperlipidemia, HTN, LBBB, osteopenia, remote tobacco use, chronic oxygen use and chronic heart failure  Reviewed last echo report from 04/26/16 which showed an EF of 35-40% along with mild MR/TR. EF has decreased from 2013 when it was 45-50%. Cardiac catheterization done 04/26/16 due to STEMI showing 100% stenosis of ost LAD to prox LAD. Drug eluting stent place with 0% residual stenosis post intervention. Another catheterization was done 05/05/16 and showed widely patent LAD stent without stent thrombosis.  Admitted 01/21/17 due to HF exacerbation. Given IV diuretics and thoracentesis done with removal of ~6106ml fluid. Transitioned to oral diuretics. Cardiology and palliative care consults were obtained. Discharged after 4 days. Admitted 01/12/17 due to HF exacerbation. Cardiology consult obtained. Initially needed IV diuretics and then transitioned to oral diuretics. Had left-sided thoracentesis done with removal of 1L of fluid. Discharged after 5 days. Was in the ED 12/18/16 due to HF exacerbation. Ultrasound was negative for DVT. Diuretics adjusted and she was released.   She presents today for her follow-up visit with a chief complaint of   Past Medical History:  Diagnosis Date  . Allergic rhinitis   . Anxiety   . Arthritis   . Atypical chest pain   . Bronchitis 01/14/2016  . Chronic diastolic CHF (congestive heart failure) (HCC)    Echo 06/2010 EF 60-65%, no RWMAs, grade 1 diastolic dysfunction, mild LAE, PASP 48mmHg.  Marland Kitchen Coronary artery disease    MI  . DIVERTICULOSIS, COLON   . HEMORRHOIDS, INTERNAL   . Hepatic steatosis   . Hyperlipidemia   . HYPERTENSION   . LBBB (left bundle branch block)    a. present on ECG 06/2010  . Osteopenia   . Pre-diabetes    A1c 6.0   Past  Surgical History:  Procedure Laterality Date  . ABDOMINAL HYSTERECTOMY    . BREAST SURGERY    . CATARACT EXTRACTION W/PHACO Left 05/12/2014   Procedure: CATARACT EXTRACTION PHACO AND INTRAOCULAR LENS PLACEMENT (IOC);  Surgeon: Leandrew Koyanagi, MD;  Location: [facility];  Service: Ophthalmology;  Laterality: Left;  . COLONOSCOPY     a. 2010  . CORONARY STENT INTERVENTION N/A 04/26/2016   Procedure: Coronary Stent Intervention;  Surgeon: Isaias Cowman, MD;  Location: [facility];  Service: Cardiovascular;  Laterality: N/A;  . LEFT HEART CATH AND CORONARY ANGIOGRAPHY N/A 04/26/2016   Procedure: Left Heart Cath and Coronary Angiography;  Surgeon: Isaias Cowman, MD;  Location: [facility];  Service: Cardiovascular;  Laterality: N/A;  . LEFT HEART CATH AND CORONARY ANGIOGRAPHY N/A 05/05/2016   Procedure: Left Heart Cath and Coronary Angiography;  Surgeon: Wellington Hampshire, MD;  Location: [facility];  Service: Cardiovascular;  Laterality: N/A;  . TONSILLECTOMY     Family History  Problem Relation Age of Onset  . Colon cancer Sister   . Melanoma Sister   . Hyperlipidemia Brother   . Hypertension Mother   . Lupus Mother        died 79 from surgical complications  . Alzheimer's disease Brother        unknown  . Kidney disease Father        died 74  . Hypertension Father   . Alcohol abuse Father    Social History  Tobacco Use  . Smoking status: Former Smoker    Packs/day: 0.25    Years: 20.00    Pack years: 5.00    Types: Cigarettes    Last attempt to quit: 01/02/1971    Years since quitting: 46.1  . Smokeless tobacco: Never Used  . Tobacco comment: smoked a few cigarettes/day x 20 yrs - quit @ age 31.  Substance Use Topics  . Alcohol use: No   Allergies  Allergen Reactions  . Fenofibrate Other (See Comments)    Myalgias/gi upset   . Statins Other (See Comments)    Myalgias   . Sulfonamide Derivatives Nausea And Vomiting     REACTION: nausea    Review of Systems  Constitutional: Negative for appetite change and fatigue.  HENT: Positive for congestion (ear congestion). Negative for postnasal drip and sore throat.   Eyes: Negative.   Respiratory: Positive for shortness of breath. Negative for cough and chest tightness.   Cardiovascular: Negative for chest pain, palpitations and leg swelling.  Gastrointestinal: Negative for abdominal distention and abdominal pain.  Endocrine: Negative.   Genitourinary: Negative.   Musculoskeletal: Positive for neck pain. Negative for back pain.  Skin: Negative.   Allergic/Immunologic: Negative.   Neurological: Negative for dizziness and light-headedness.  Hematological: Negative for adenopathy. Does not bruise/bleed easily.  Psychiatric/Behavioral: Negative for dysphoric mood and sleep disturbance (sleeping on 2 pillows with oxygen at 2L around the clock). The patient is not nervous/anxious.      Physical Exam  Constitutional: She is oriented to person, place, and time. She appears well-developed and well-nourished.  HENT:  Head: Normocephalic and atraumatic.  Neck: Normal range of motion. Neck supple. No JVD present.  Cardiovascular: Normal rate and regular rhythm.  Pulmonary/Chest: Effort normal. She has no wheezes. She has no rales.  Abdominal: Soft. She exhibits no distension. There is no tenderness.  Musculoskeletal: She exhibits no edema or tenderness.  Neurological: She is alert and oriented to person, place, and time.  Skin: Skin is warm and dry.  Psychiatric: She has a normal mood and affect. Her behavior is normal. Thought content normal.  Nursing note and vitals reviewed.   Assessment & Plan:  1: Chronic heart failure with reduced ejection fraction- - NYHA class II - euvolemic today - already weighing daily and she has parameters to take an extra furosemide for an overnight weight gain of 3 pounds or more. Also advised her to call for a weekly weight gain  of >5 pounds - not adding salt and her daughter doesn't cook with salt either. Encouraged her to continue following a 2000mg  sodium diet - may be limited in adding entresto due to blood pressure - currently has [facility] health coming out - currently taking furosemide 40mg  AM/ 20mg  PM - REDS vest reading was 32% - saw cardiologist Nehemiah Massed) 01/10/17 - BNP on 01/12/17 was 2508.0  2: HTN- - BP on the low side - denies dizziness - saw PCP (Opalski) 10/15/16 - BMP from 01/23/17 reviewed and showed sodium 138, potassium 3.5 and GFR 37  Medication bottles were reviewed.

## 2017-03-01 DEATH — deceased

## 2017-03-07 LAB — ACID FAST CULTURE WITH REFLEXED SENSITIVITIES (MYCOBACTERIA): Acid Fast Culture: NEGATIVE

## 2017-03-22 LAB — ACID FAST SMEAR (AFB, MYCOBACTERIA): Acid Fast Smear: NEGATIVE

## 2017-05-06 ENCOUNTER — Ambulatory Visit: Payer: Medicare HMO | Admitting: Family

## 2018-10-23 IMAGING — DX DG CHEST 1V PORT
1 series · 1 of 1 positions shown · non-contrast
Comparison: April 26, 2016

CLINICAL DATA: Shortness of breath and chest pain

EXAM:
PORTABLE CHEST 1 VIEW

[chest ap]
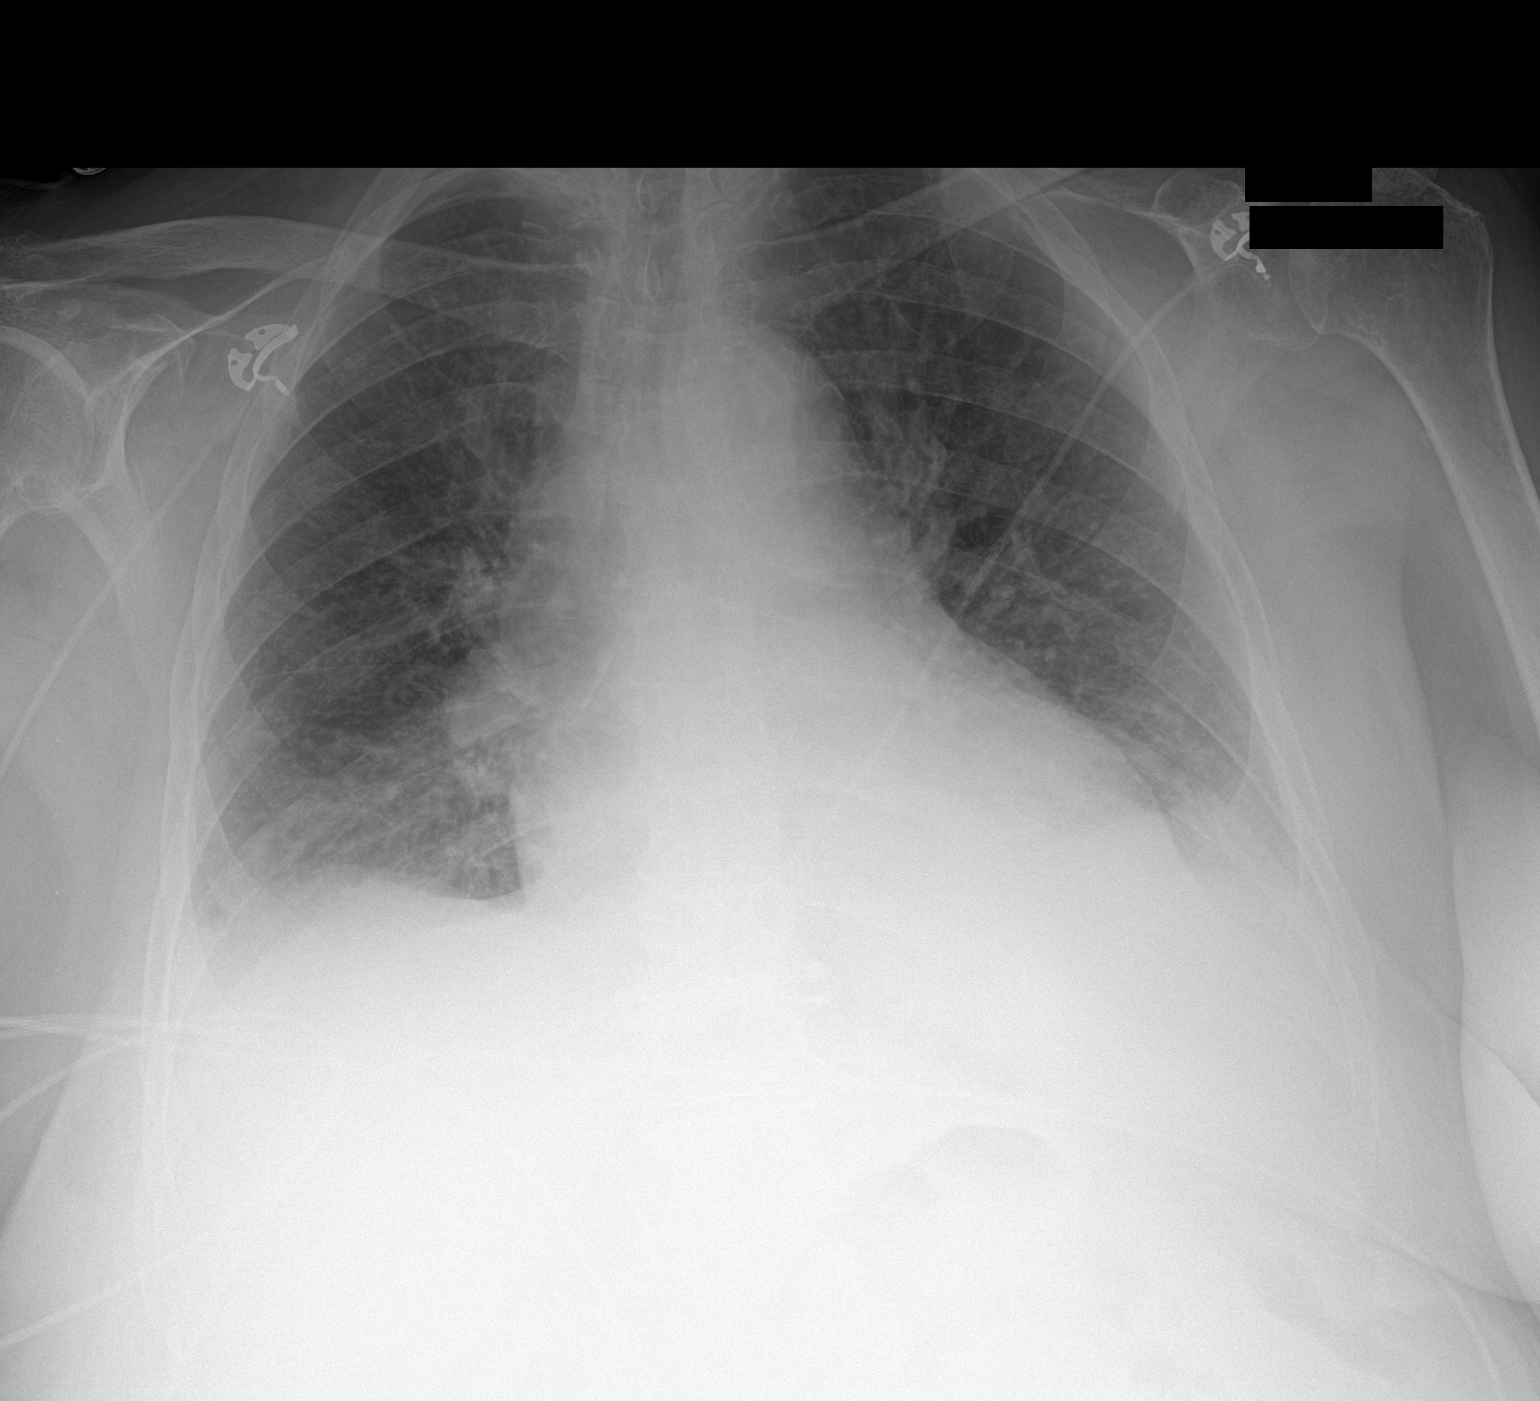

[1 of 1 positions shown; findings below may reference images not displayed]

FINDINGS: There are rather minimal pleural effusions bilaterally. There is no
edema or consolidation. Heart is mildly enlarged. There is no
evident adenopathy. There is aortic atherosclerosis. No bone
lesions.
IMPRESSION: Mild cardiomegaly with rather minimal pleural effusions bilaterally.
No edema or consolidation. There is aortic atherosclerosis.

## 2019-03-27 IMAGING — US US EXTREM LOW VENOUS*R*
1 series · 13 of 24 positions shown · non-contrast
Comparison: 05/15/2016

CLINICAL DATA: [AGE] female with right leg swelling



[Series 1: us extrem low venous*right* · 13 of 47 slices shown]
[im 1/47]
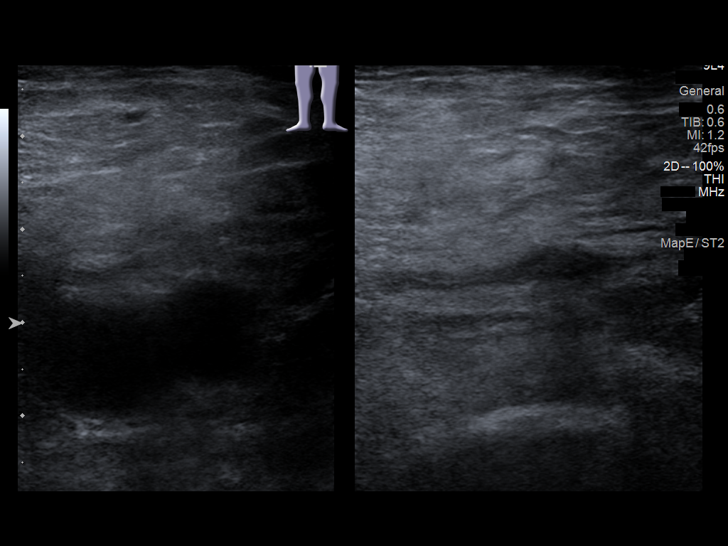
[im 5/47]
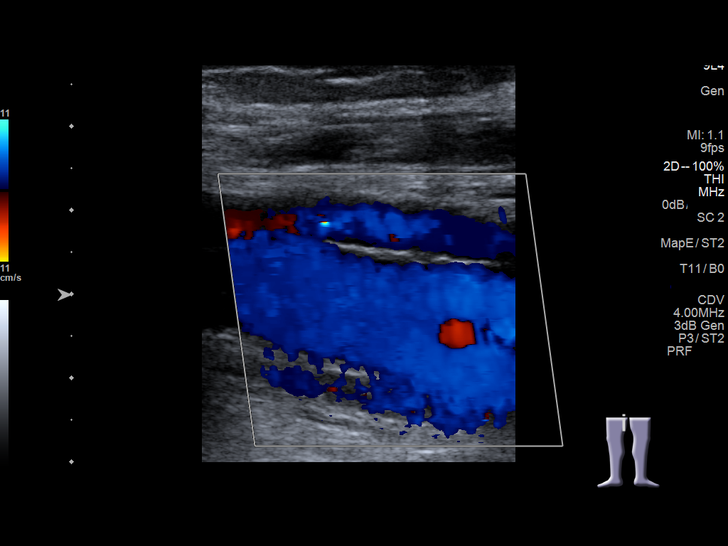
[im 9/47]
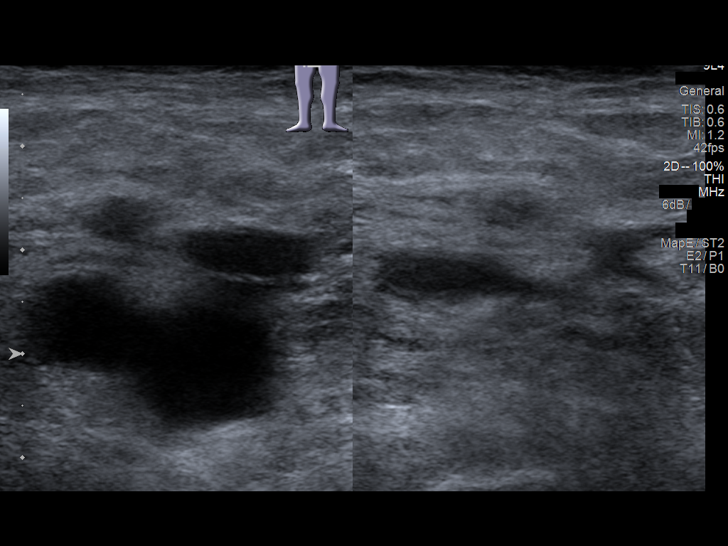
[im 13/47]
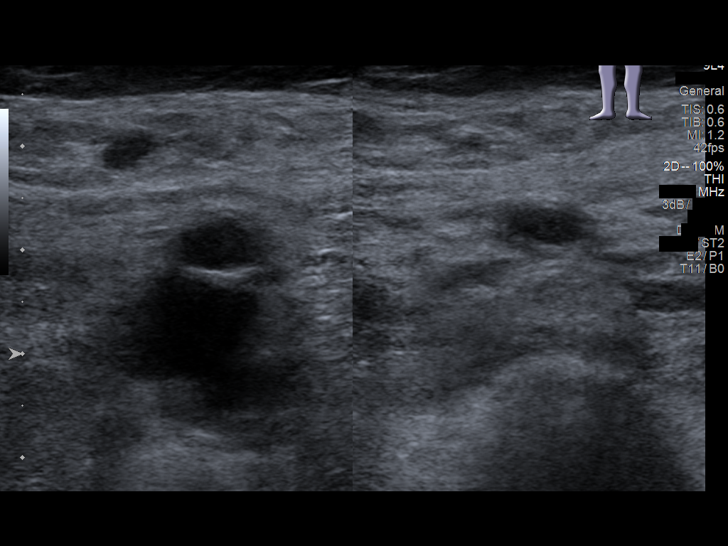
[im 17/47]
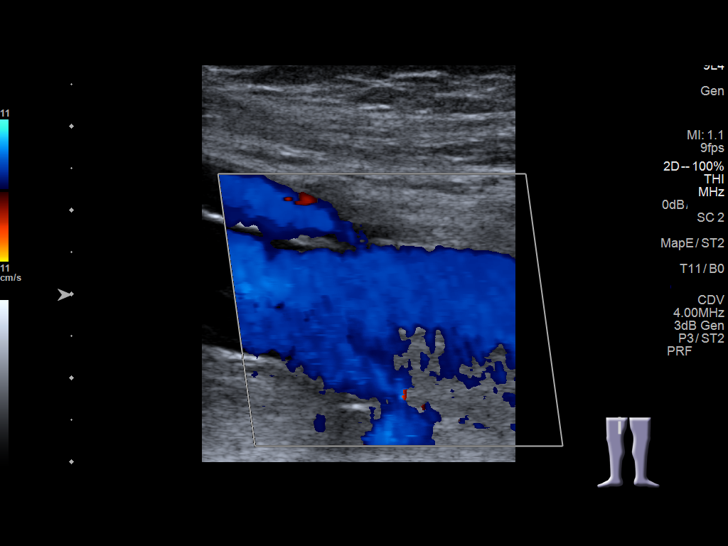
[im 21/47]
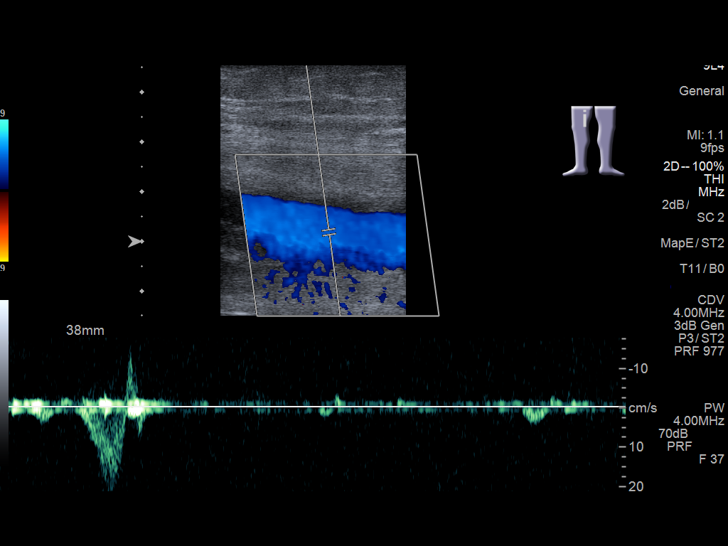
[im 25/47]
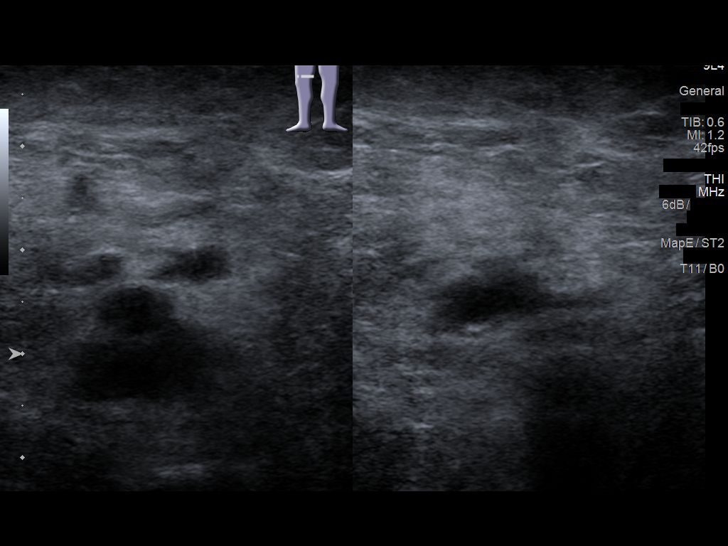
[im 27/47]
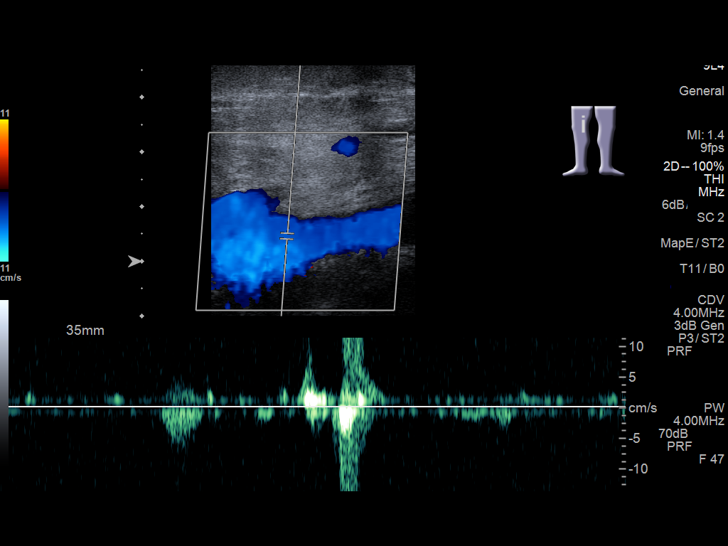
[im 31/47]
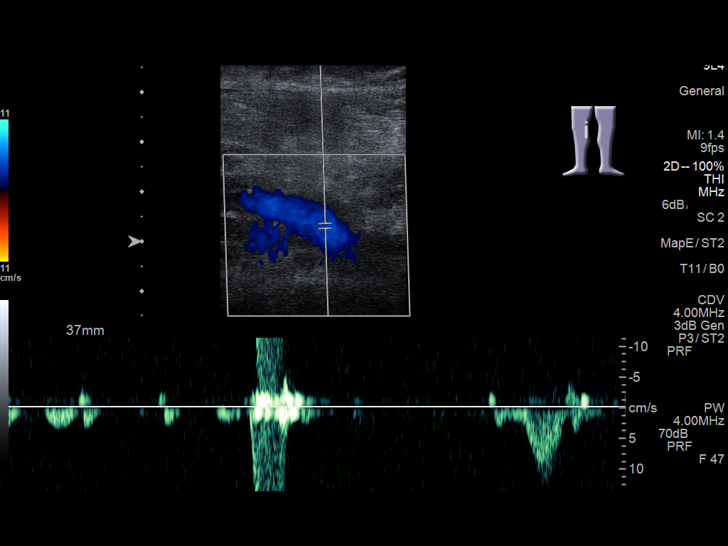
[im 35/47]
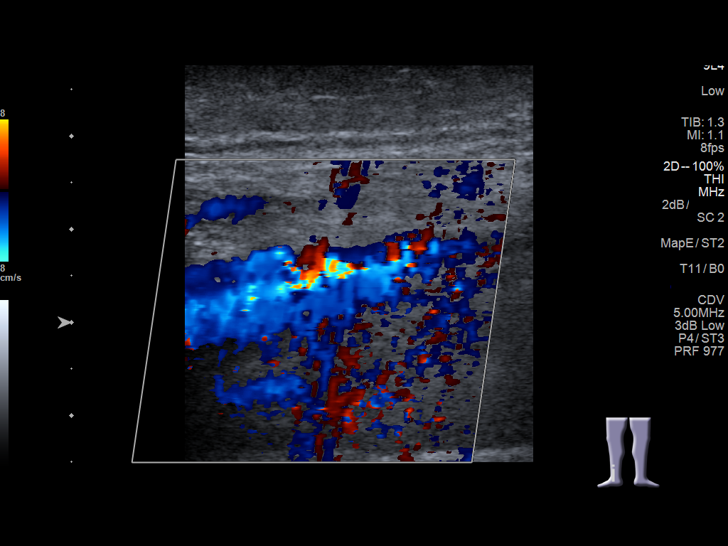
[im 39/47]
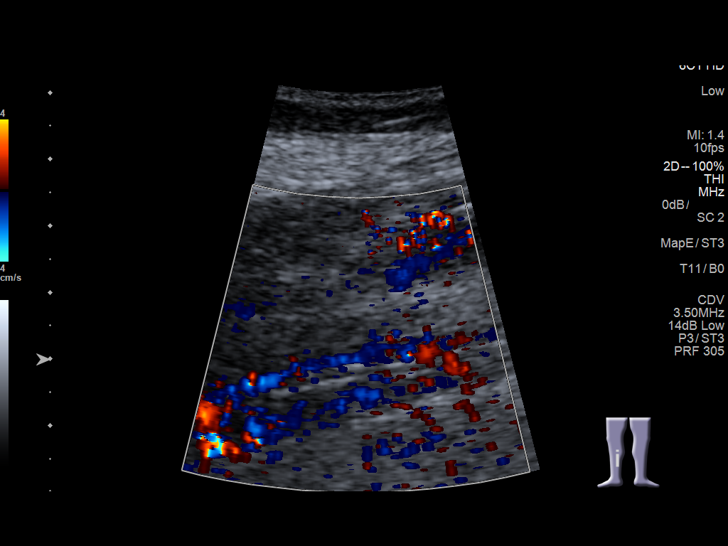
[im 43/47]
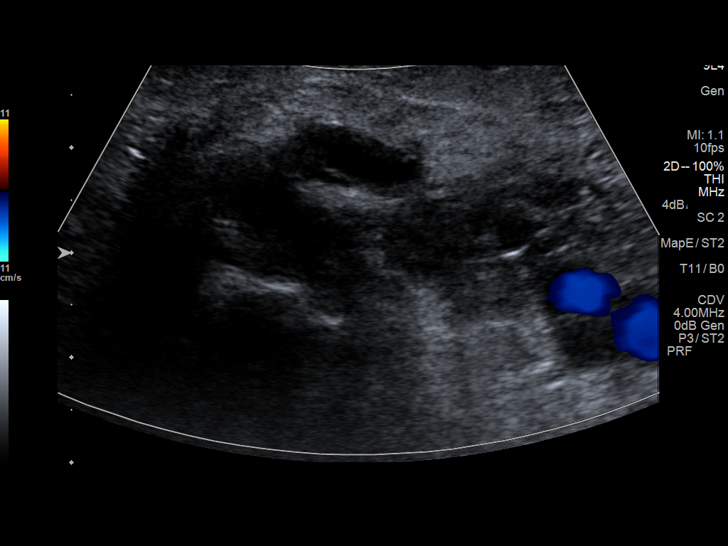
[im 47/47]
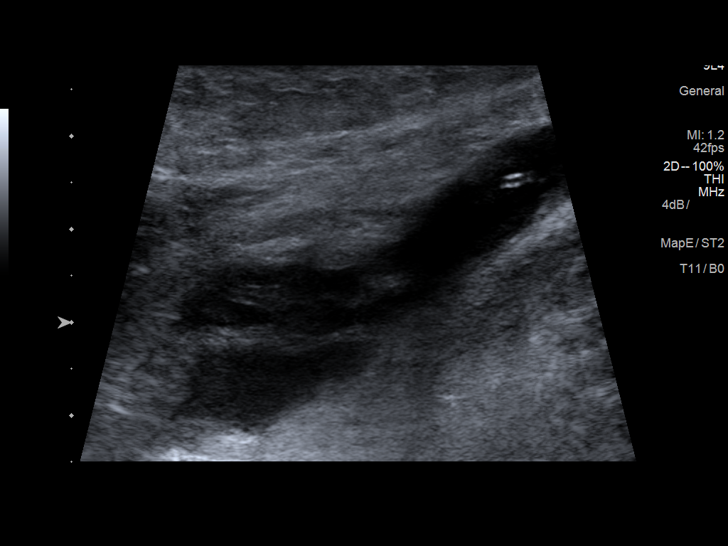

[13 of 24 positions shown; findings below may reference images not displayed]

FINDINGS: Contralateral Common Femoral Vein: Respiratory phasicity is normal
and symmetric with the symptomatic side. No evidence of thrombus.
Normal compressibility.

Common Femoral Vein: No evidence of thrombus. Normal
compressibility, respiratory phasicity and response to augmentation.

Saphenofemoral Junction: No evidence of thrombus. Normal
compressibility and flow on color Doppler imaging.

Profunda Femoral Vein: No evidence of thrombus. Normal
compressibility and flow on color Doppler imaging.

Femoral Vein: No evidence of thrombus. Normal compressibility,
respiratory phasicity and response to augmentation.

Popliteal Vein: No evidence of thrombus. Normal compressibility,
respiratory phasicity and response to augmentation.

Calf Veins: Re- demonstration of thrombus of right peroneal vein
without proximal extension.

Superficial Great Saphenous Vein: No evidence of thrombus. Normal
compressibility and flow on color Doppler imaging.

Other Findings: Heterogeneous fluid collection the right popliteal
fossa measures 5.6 cm x 1.1 cm x 2.1 cm.
IMPRESSION: Sonographic survey negative for DVT of the common femoral, femoral,
popliteal veins.

Re- demonstration of thrombus of right peroneal vein, unchanged
since study in May 2016.

Right-sided Baker's cyst.

## 2019-04-21 IMAGING — US US ABDOMEN LIMITED
1 series · 14 of 25 positions shown · non-contrast
Comparison: 06/17/2010

CLINICAL DATA: Elevated bilirubin level.

EXAM:
ULTRASOUND ABDOMEN LIMITED RIGHT UPPER QUADRANT

[Series 1: us abdomen limited · 0.23mm/px · 14 of 61 slices shown]
[im 1/61]
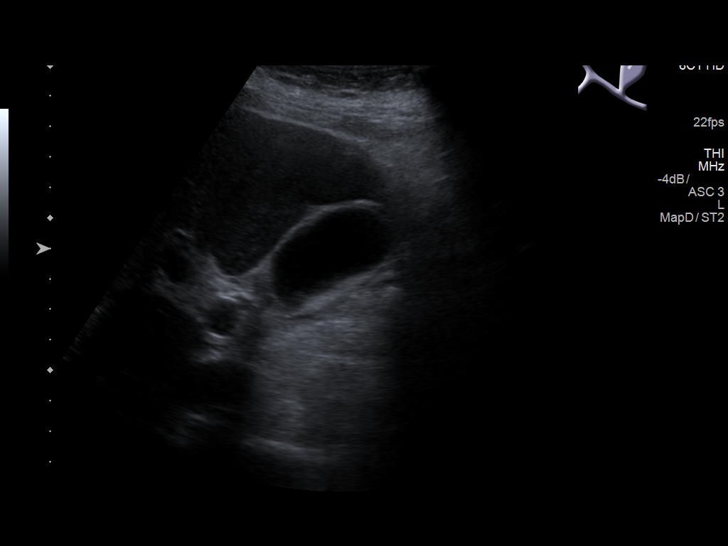
[im 6/61]
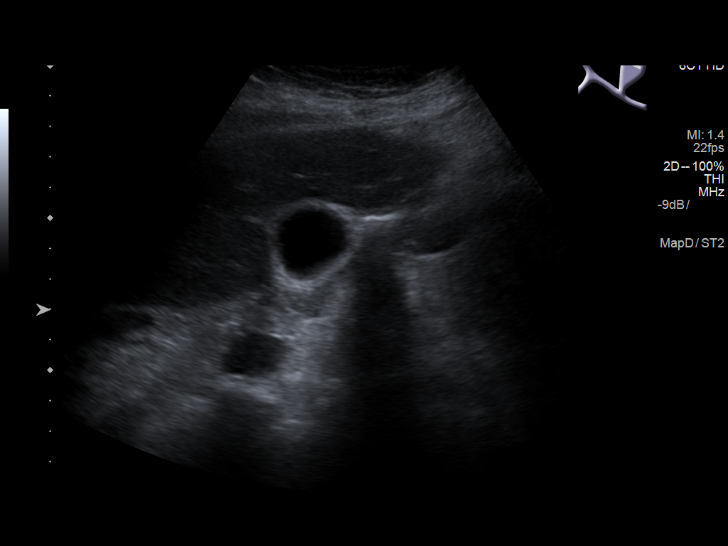
[im 11/61]
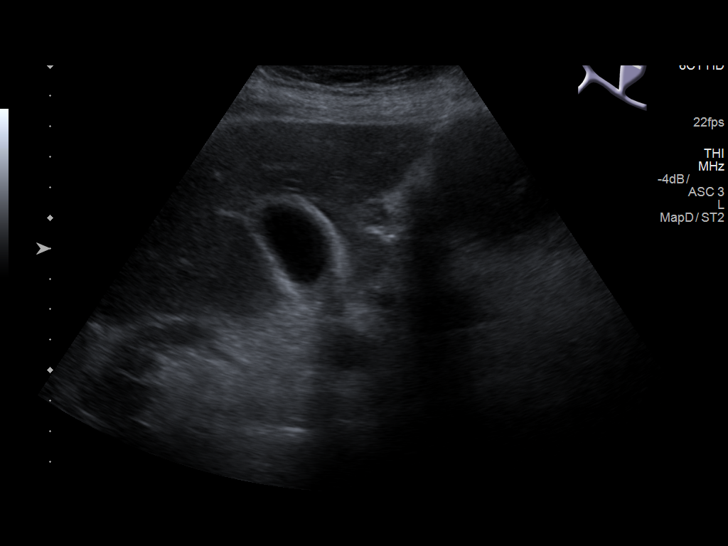
[im 16/61]
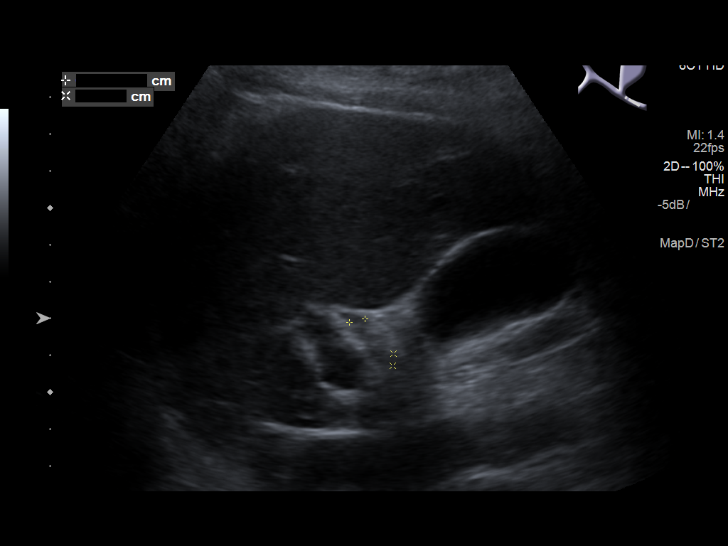
[im 21/61]
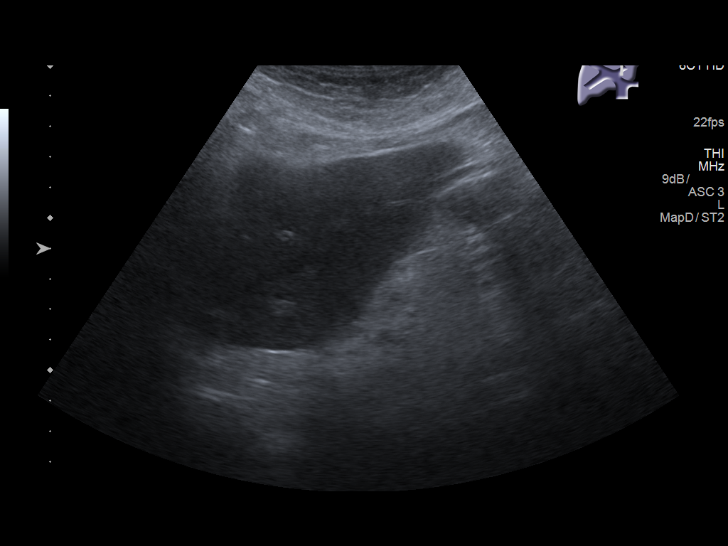
[im 23/61]
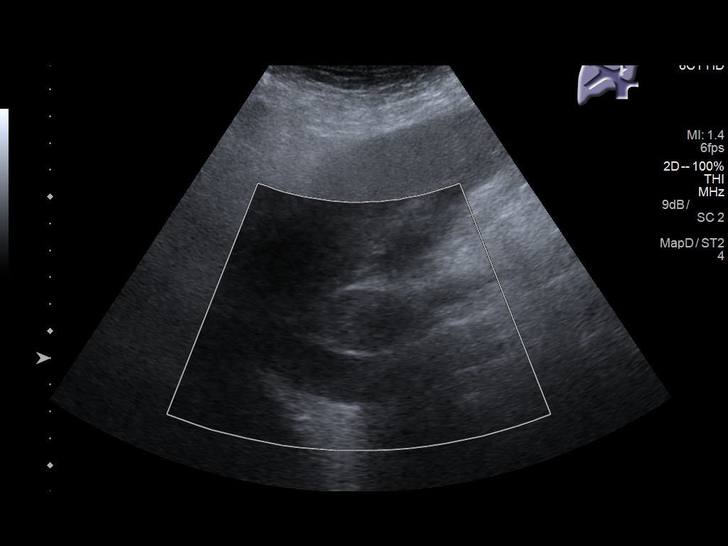
[im 28/61]
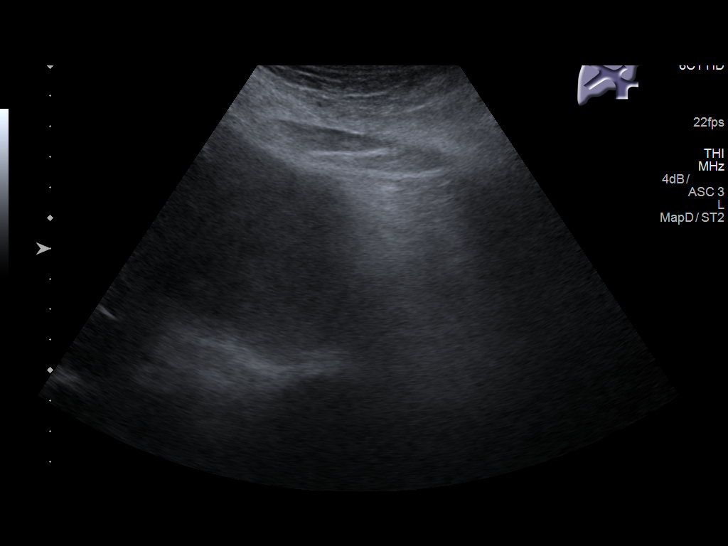
[im 33/61]
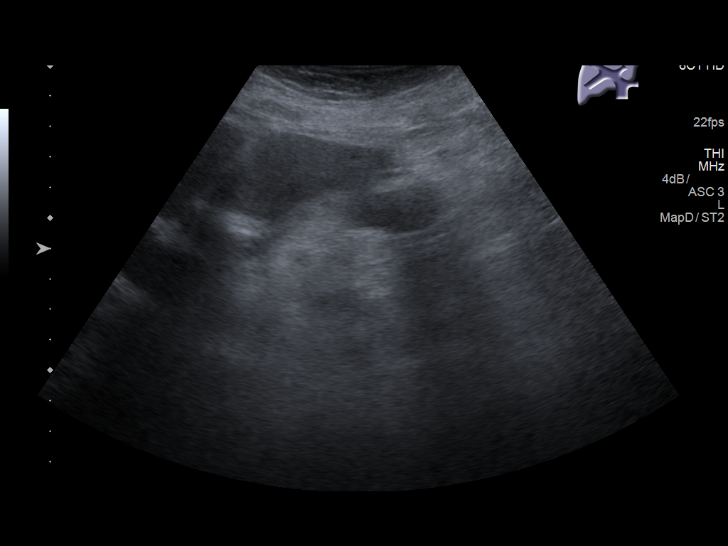
[im 38/61]
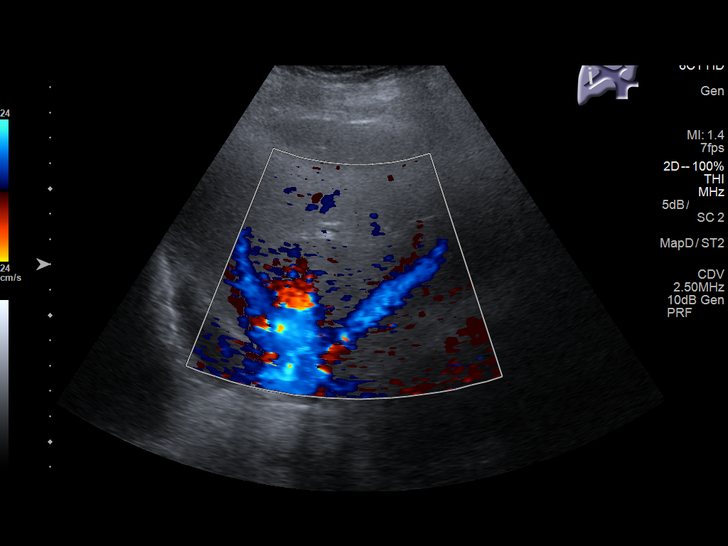
[im 41/61]
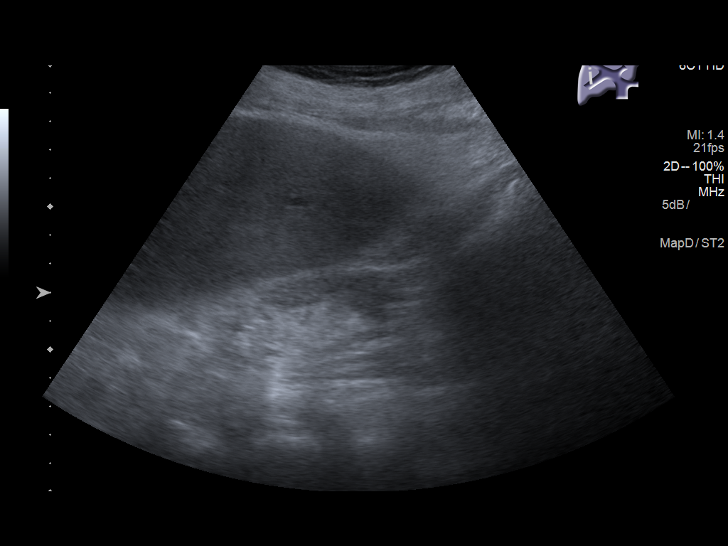
[im 46/61]
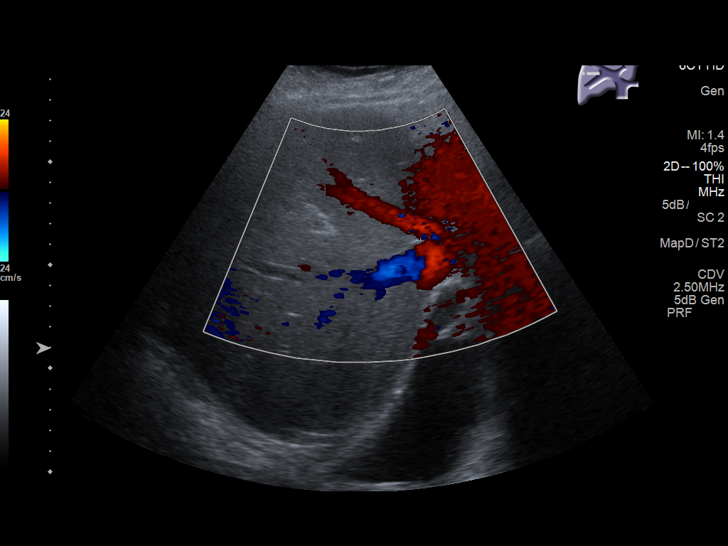
[im 51/61]
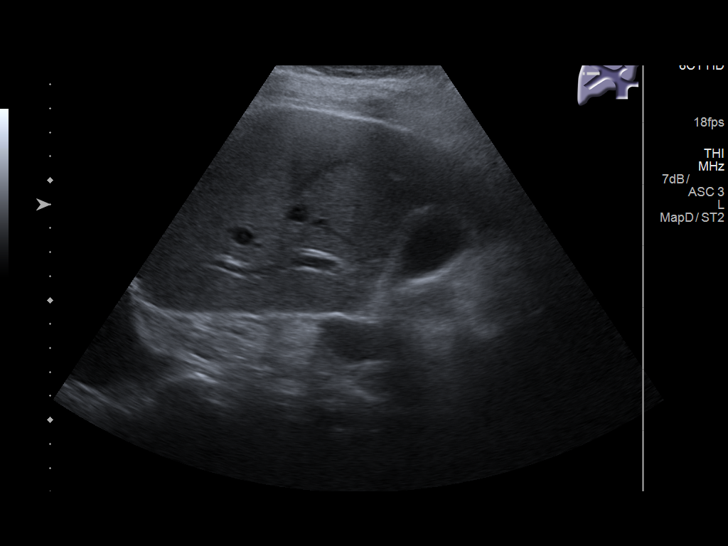
[im 56/61]
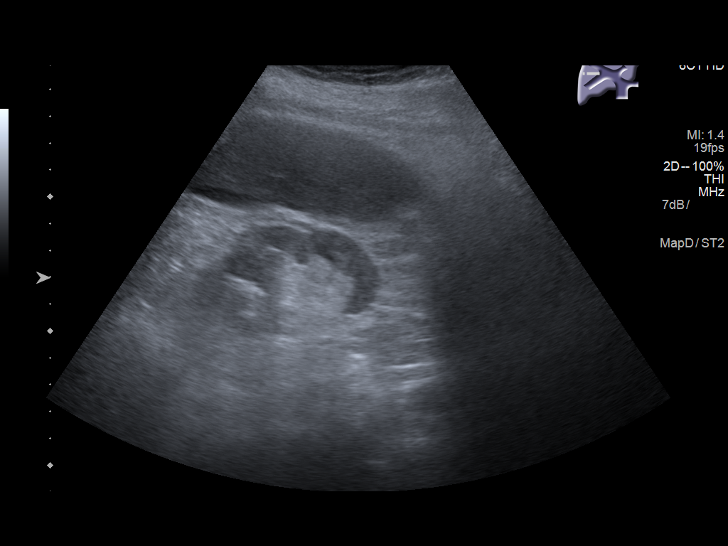
[im 61/61]
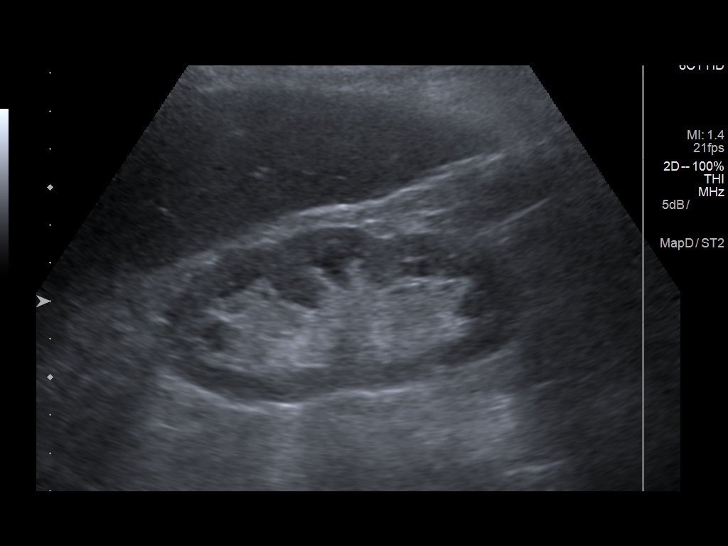

[14 of 25 positions shown; findings below may reference images not displayed]

FINDINGS: Gallbladder:

No gallstones or wall thickening visualized. No sonographic Murphy
sign noted by sonographer.

Common bile duct:

Diameter: Normal, 4 mm

Liver:

No focal lesion identified. Within normal limits in parenchymal
echogenicity. Portal vein is patent on color Doppler imaging with
normal direction of blood flow towards the liver.

Right pleural effusion.
IMPRESSION: 1.  No acute process or explanation for elevated bilirubin level.
2. Right pleural effusion.

## 2019-07-08 IMAGING — DX DG CHEST 1V PORT
1 series · 1 of 1 positions shown · non-contrast
Comparison: 07/12/2016

CLINICAL DATA: Shortness of Breath

EXAM:
PORTABLE CHEST 1 VIEW

[chest ap]
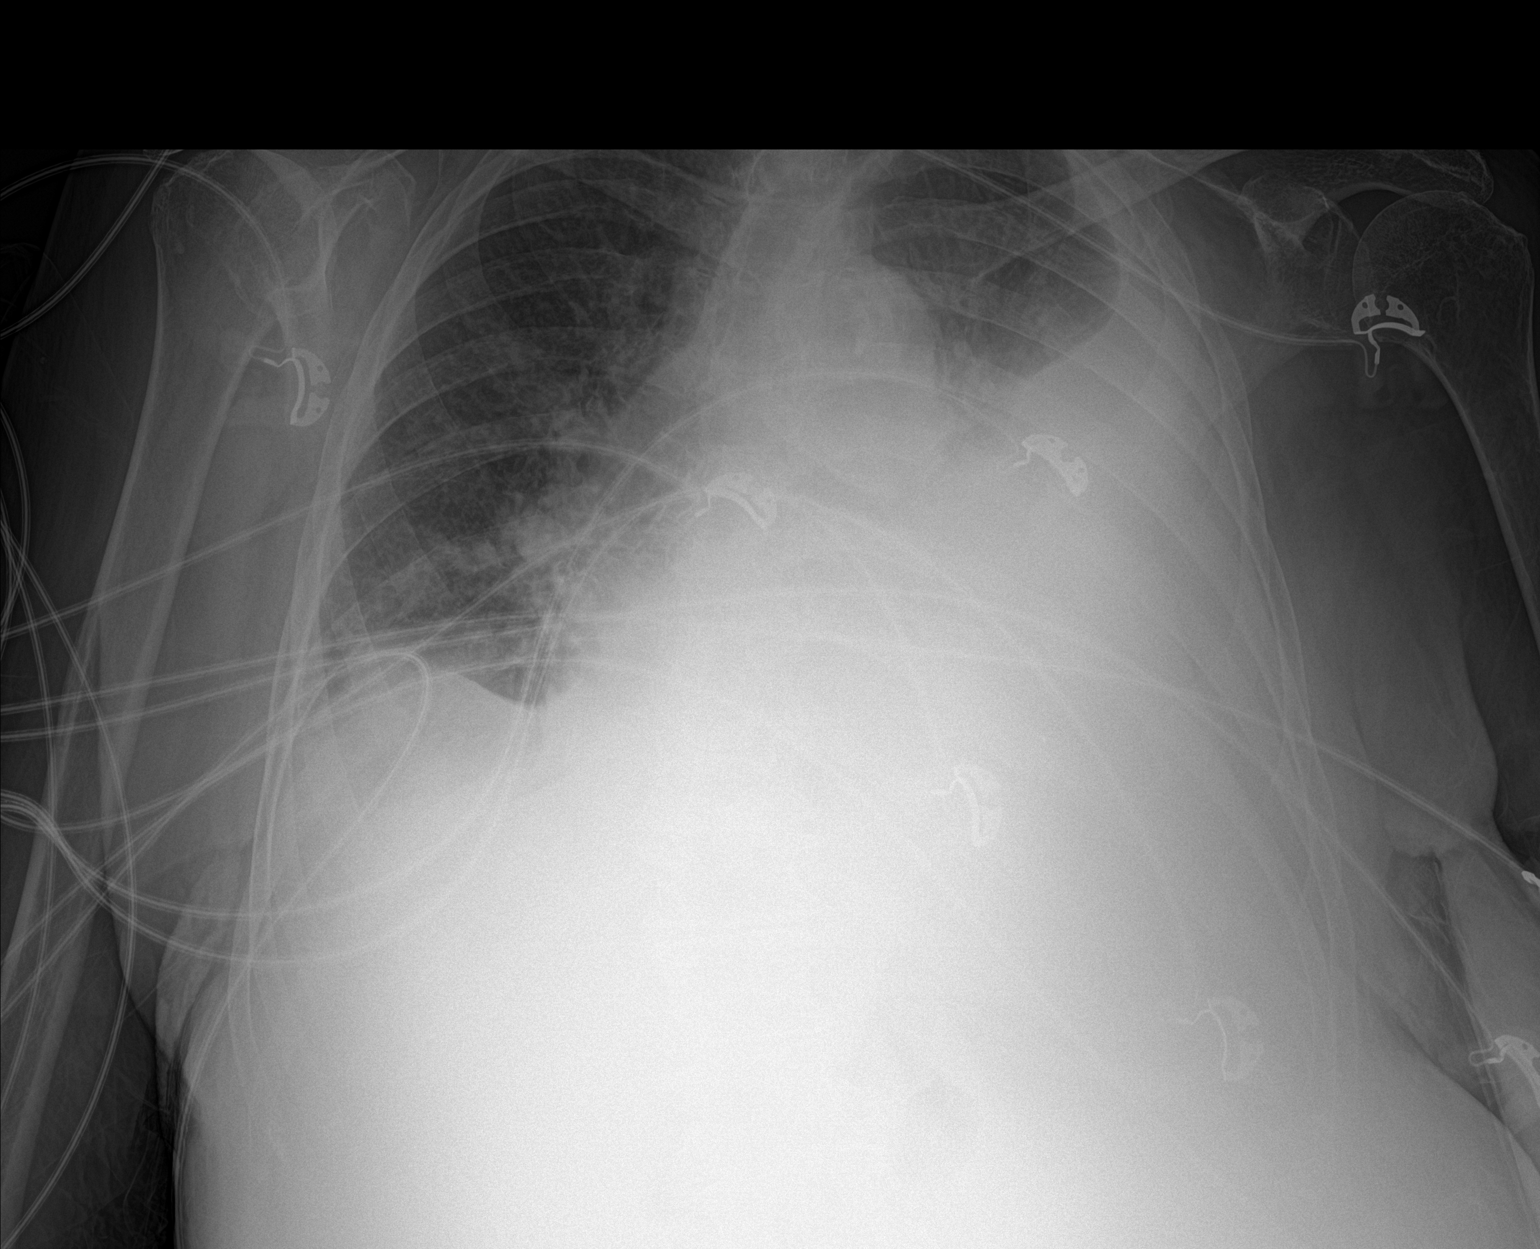

[1 of 1 positions shown; findings below may reference images not displayed]

FINDINGS: Large left pleural effusion, worsened since prior study. Left lower
lobe atelectasis. Cardiomegaly with vascular congestion. Suspect
mild interstitial edema.
IMPRESSION: Large left pleural effusion, increasing since prior study. Left
lower lobe atelectasis.

Suspect mild edema/CHF.

## 2019-07-10 IMAGING — CR DG CHEST 1V
1 series · 1 of 1 positions shown · non-contrast
Comparison: 01/12/2017

CLINICAL DATA: Post left thoracentesis

EXAM:
CHEST 1 VIEW

[dg chest 1 view]
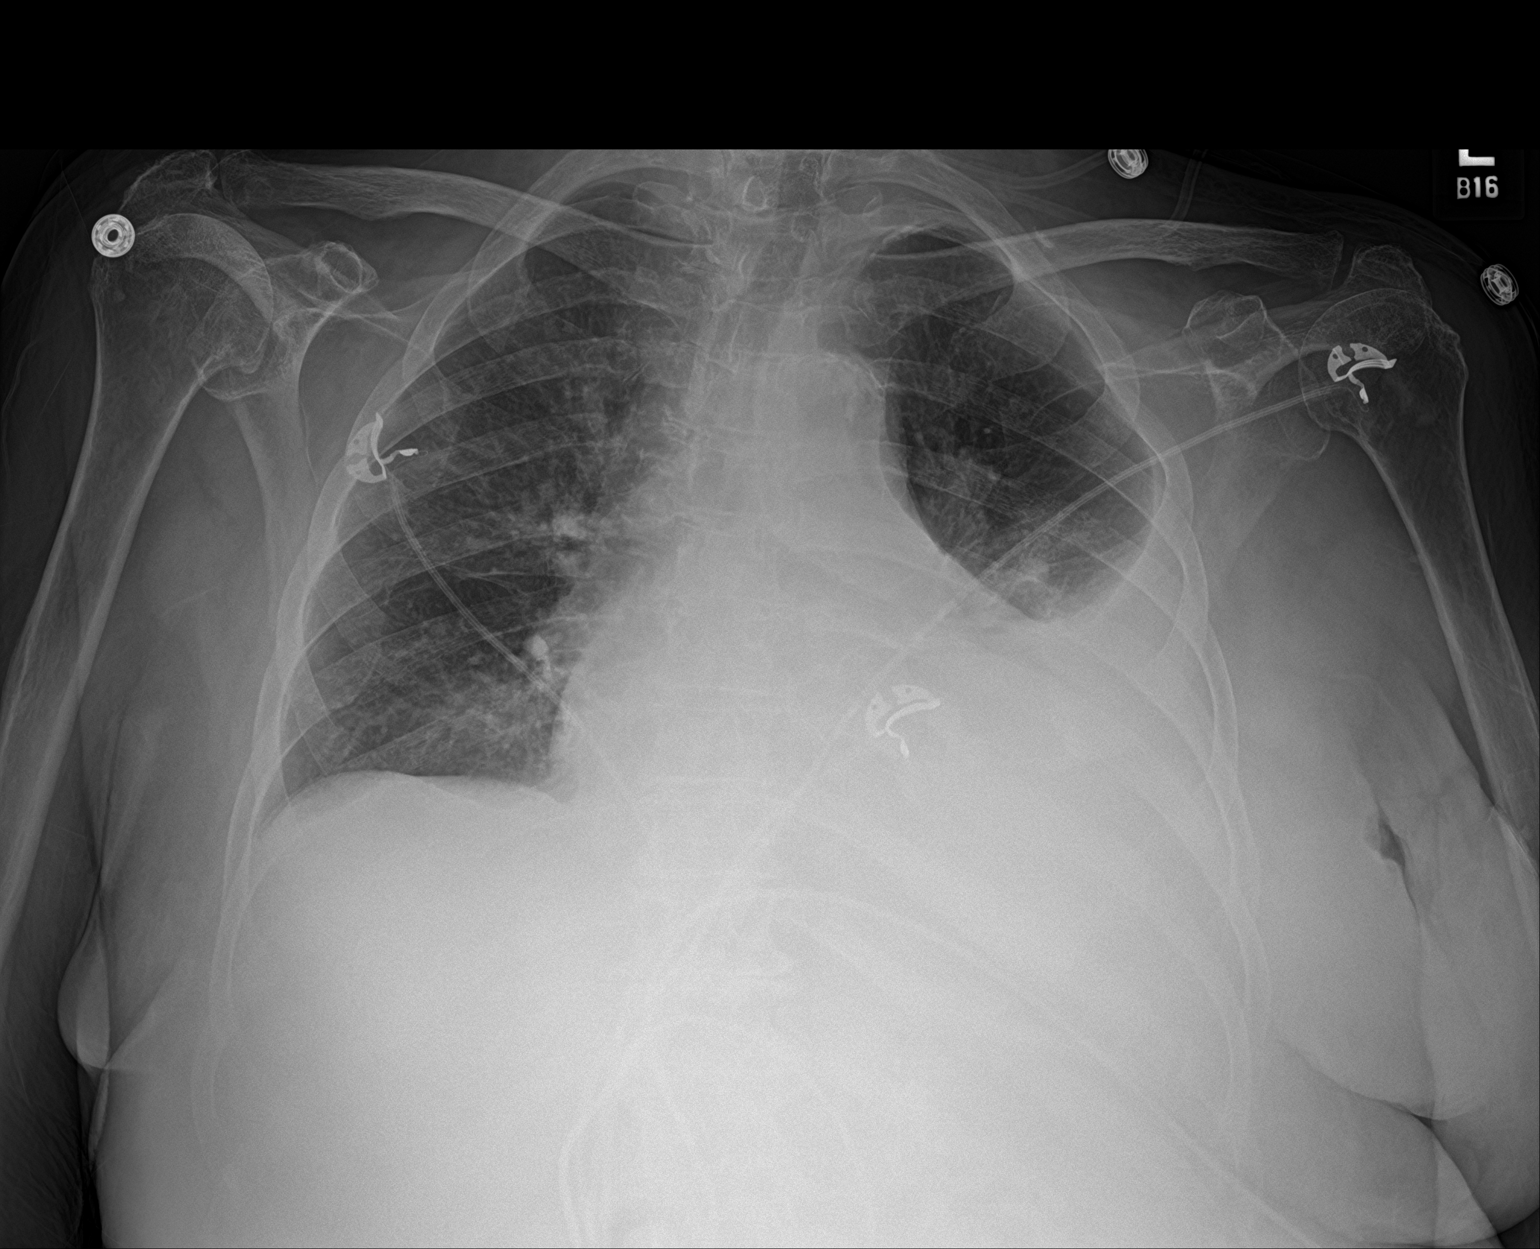

[1 of 1 positions shown; findings below may reference images not displayed]

FINDINGS: Large left pleural effusion remains, slightly decreased since prior
study. No pneumothorax. Left lower lobe atelectasis. No effusion or
confluent opacity on the right. Mild cardiomegaly.
IMPRESSION: Decreasing size of the large left pleural effusion. No pneumothorax.
